# Patient Record
Sex: Male | Born: 1940
Health system: Southern US, Community
[De-identification: ages and names within clinical notes are randomized; demographics above are authoritative.]

## PROBLEM LIST (undated history)

## (undated) DIAGNOSIS — K219 Gastro-esophageal reflux disease without esophagitis: Secondary | ICD-10-CM

## (undated) DIAGNOSIS — I219 Acute myocardial infarction, unspecified: Secondary | ICD-10-CM

## (undated) DIAGNOSIS — N189 Chronic kidney disease, unspecified: Secondary | ICD-10-CM

## (undated) DIAGNOSIS — R0602 Shortness of breath: Secondary | ICD-10-CM

## (undated) DIAGNOSIS — I2510 Atherosclerotic heart disease of native coronary artery without angina pectoris: Secondary | ICD-10-CM

## (undated) DIAGNOSIS — I739 Peripheral vascular disease, unspecified: Secondary | ICD-10-CM

## (undated) DIAGNOSIS — C801 Malignant (primary) neoplasm, unspecified: Secondary | ICD-10-CM

## (undated) DIAGNOSIS — I6529 Occlusion and stenosis of unspecified carotid artery: Secondary | ICD-10-CM

## (undated) DIAGNOSIS — E785 Hyperlipidemia, unspecified: Secondary | ICD-10-CM

## (undated) DIAGNOSIS — I10 Essential (primary) hypertension: Secondary | ICD-10-CM

## (undated) DIAGNOSIS — G4730 Sleep apnea, unspecified: Secondary | ICD-10-CM

## (undated) HISTORY — PX: MOHS SURGERY: SHX181

## (undated) HISTORY — DX: Atherosclerotic heart disease of native coronary artery without angina pectoris: I25.10

## (undated) HISTORY — PX: CAROTID ENDARTERECTOMY: SUR193

## (undated) HISTORY — DX: Essential (primary) hypertension: I10

## (undated) HISTORY — DX: Hyperlipidemia, unspecified: E78.5

## (undated) HISTORY — PX: CYST REMOVAL HAND: SHX6279

## (undated) HISTORY — DX: Peripheral vascular disease, unspecified: I73.9

## (undated) HISTORY — PX: SHOULDER ARTHROSCOPY: SHX128

## (undated) HISTORY — DX: Occlusion and stenosis of unspecified carotid artery: I65.29

## (undated) LAB — BASIC METABOLIC PANEL: Chloride: 10 — AB (ref 99–108)

---

## 1990-12-30 HISTORY — PX: CORONARY ARTERY BYPASS GRAFT: SHX141

## 1998-07-22 ENCOUNTER — Inpatient Hospital Stay (HOSPITAL_COMMUNITY): Admission: EM | Admit: 1998-07-22 | Discharge: 1998-07-23 | Payer: Self-pay | Admitting: Emergency Medicine

## 1999-07-17 ENCOUNTER — Inpatient Hospital Stay (HOSPITAL_COMMUNITY): Admission: RE | Admit: 1999-07-17 | Discharge: 1999-07-19 | Payer: Self-pay | Admitting: *Deleted

## 1999-07-17 ENCOUNTER — Encounter: Payer: Self-pay | Admitting: *Deleted

## 1999-07-27 ENCOUNTER — Inpatient Hospital Stay: Admission: EM | Admit: 1999-07-27 | Discharge: 1999-07-30 | Payer: Self-pay | Admitting: Vascular Surgery

## 1999-07-28 ENCOUNTER — Encounter: Payer: Self-pay | Admitting: Vascular Surgery

## 1999-08-10 ENCOUNTER — Emergency Department (HOSPITAL_COMMUNITY): Admission: EM | Admit: 1999-08-10 | Discharge: 1999-08-10 | Payer: Self-pay | Admitting: Emergency Medicine

## 2004-12-12 ENCOUNTER — Ambulatory Visit: Payer: Self-pay | Admitting: Cardiology

## 2005-01-07 ENCOUNTER — Ambulatory Visit: Payer: Self-pay

## 2005-07-11 ENCOUNTER — Ambulatory Visit (HOSPITAL_COMMUNITY): Admission: RE | Admit: 2005-07-11 | Discharge: 2005-07-11 | Payer: Self-pay | Admitting: Family Medicine

## 2005-11-27 ENCOUNTER — Ambulatory Visit: Payer: Self-pay | Admitting: Cardiology

## 2005-11-29 ENCOUNTER — Ambulatory Visit: Payer: Self-pay

## 2005-12-10 ENCOUNTER — Ambulatory Visit (HOSPITAL_BASED_OUTPATIENT_CLINIC_OR_DEPARTMENT_OTHER): Admission: RE | Admit: 2005-12-10 | Discharge: 2005-12-10 | Payer: Self-pay | Admitting: Orthopaedic Surgery

## 2005-12-10 ENCOUNTER — Ambulatory Visit (HOSPITAL_COMMUNITY): Admission: RE | Admit: 2005-12-10 | Discharge: 2005-12-10 | Payer: Self-pay | Admitting: Orthopaedic Surgery

## 2006-01-24 ENCOUNTER — Ambulatory Visit: Payer: Self-pay

## 2006-12-15 ENCOUNTER — Ambulatory Visit: Payer: Self-pay | Admitting: Cardiology

## 2007-12-01 ENCOUNTER — Ambulatory Visit: Payer: Self-pay | Admitting: Cardiology

## 2007-12-17 ENCOUNTER — Ambulatory Visit: Payer: Self-pay

## 2007-12-17 ENCOUNTER — Ambulatory Visit: Payer: Self-pay | Admitting: Cardiology

## 2007-12-17 LAB — CONVERTED CEMR LAB
ALT: 21 units/L (ref 0–53)
AST: 20 units/L (ref 0–37)
Albumin: 3.4 g/dL — ABNORMAL LOW (ref 3.5–5.2)
Alkaline Phosphatase: 68 units/L (ref 39–117)
BUN: 11 mg/dL (ref 6–23)
Bilirubin, Direct: 0.1 mg/dL (ref 0.0–0.3)
CO2: 30 meq/L (ref 19–32)
Calcium: 9.7 mg/dL (ref 8.4–10.5)
Chloride: 107 meq/L (ref 96–112)
Cholesterol: 186 mg/dL (ref 0–200)
Creatinine, Ser: 0.8 mg/dL (ref 0.4–1.5)
GFR calc Af Amer: 125 mL/min
GFR calc non Af Amer: 103 mL/min
Glucose, Bld: 103 mg/dL — ABNORMAL HIGH (ref 70–99)
HDL: 28.2 mg/dL — ABNORMAL LOW (ref >39.0)
LDL Cholesterol: 124 mg/dL — ABNORMAL HIGH (ref 0–99)
Potassium: 4.4 meq/L (ref 3.5–5.1)
Pro B Natriuretic peptide (BNP): 49 pg/mL (ref 0.0–100.0)
Sodium: 142 meq/L (ref 135–145)
Total Bilirubin: 1 mg/dL (ref 0.3–1.2)
Total CHOL/HDL Ratio: 6.6
Total Protein: 6.6 g/dL (ref 6.0–8.3)
Triglycerides: 170 mg/dL — ABNORMAL HIGH (ref 0–149)
VLDL: 34 mg/dL (ref 0–40)

## 2008-02-02 ENCOUNTER — Ambulatory Visit: Payer: Self-pay | Admitting: Cardiology

## 2008-05-13 ENCOUNTER — Ambulatory Visit: Payer: Self-pay | Admitting: Vascular Surgery

## 2008-07-26 ENCOUNTER — Ambulatory Visit: Payer: Self-pay | Admitting: Gastroenterology

## 2008-08-30 ENCOUNTER — Ambulatory Visit: Payer: Self-pay | Admitting: Vascular Surgery

## 2008-09-28 ENCOUNTER — Ambulatory Visit: Payer: Self-pay | Admitting: Cardiology

## 2008-12-16 ENCOUNTER — Ambulatory Visit: Payer: Self-pay

## 2009-03-25 DIAGNOSIS — E785 Hyperlipidemia, unspecified: Secondary | ICD-10-CM | POA: Insufficient documentation

## 2009-03-25 DIAGNOSIS — I739 Peripheral vascular disease, unspecified: Secondary | ICD-10-CM | POA: Insufficient documentation

## 2009-03-25 DIAGNOSIS — I2510 Atherosclerotic heart disease of native coronary artery without angina pectoris: Secondary | ICD-10-CM | POA: Insufficient documentation

## 2009-03-27 DIAGNOSIS — I6529 Occlusion and stenosis of unspecified carotid artery: Secondary | ICD-10-CM | POA: Insufficient documentation

## 2009-03-27 DIAGNOSIS — I25810 Atherosclerosis of coronary artery bypass graft(s) without angina pectoris: Secondary | ICD-10-CM | POA: Insufficient documentation

## 2009-03-28 ENCOUNTER — Encounter: Payer: Self-pay | Admitting: Cardiology

## 2009-03-28 ENCOUNTER — Ambulatory Visit: Payer: Self-pay | Admitting: Cardiology

## 2009-04-19 ENCOUNTER — Telehealth: Payer: Self-pay | Admitting: Cardiology

## 2009-05-23 ENCOUNTER — Ambulatory Visit: Payer: Self-pay

## 2009-05-23 ENCOUNTER — Encounter: Payer: Self-pay | Admitting: Cardiology

## 2009-06-22 ENCOUNTER — Encounter: Payer: Self-pay | Admitting: Cardiology

## 2009-06-22 ENCOUNTER — Telehealth: Payer: Self-pay | Admitting: Cardiology

## 2009-07-04 ENCOUNTER — Telehealth: Payer: Self-pay | Admitting: Cardiology

## 2009-12-27 ENCOUNTER — Encounter: Payer: Self-pay | Admitting: Cardiology

## 2009-12-28 ENCOUNTER — Ambulatory Visit: Payer: Self-pay

## 2009-12-28 ENCOUNTER — Encounter: Payer: Self-pay | Admitting: Cardiology

## 2010-02-05 ENCOUNTER — Encounter: Payer: Self-pay | Admitting: Cardiology

## 2010-03-02 ENCOUNTER — Ambulatory Visit: Payer: Self-pay | Admitting: Cardiology

## 2010-06-25 ENCOUNTER — Encounter: Payer: Self-pay | Admitting: Cardiovascular Disease

## 2010-07-03 ENCOUNTER — Telehealth: Payer: Self-pay | Admitting: Cardiovascular Disease

## 2010-07-03 ENCOUNTER — Encounter: Payer: Self-pay | Admitting: Cardiovascular Disease

## 2010-09-17 ENCOUNTER — Telehealth: Payer: Self-pay | Admitting: Cardiovascular Disease

## 2010-10-01 ENCOUNTER — Ambulatory Visit: Payer: Self-pay | Admitting: Cardiovascular Disease

## 2010-10-03 ENCOUNTER — Encounter: Payer: Self-pay | Admitting: Cardiovascular Disease

## 2010-10-15 ENCOUNTER — Telehealth: Payer: Self-pay | Admitting: Cardiovascular Disease

## 2010-10-17 LAB — CONVERTED CEMR LAB
ALT: 16 units/L (ref 0–53)
AST: 18 units/L (ref 0–37)
Albumin: 4.3 g/dL (ref 3.5–5.2)
Alkaline Phosphatase: 55 units/L (ref 39–117)
Bilirubin, Direct: 0.1 mg/dL (ref 0.0–0.3)
Cholesterol: 185 mg/dL (ref 0–200)
HDL: 41 mg/dL (ref >39)
Indirect Bilirubin: 0.4 mg/dL (ref 0.0–0.9)
LDL Cholesterol: 123 mg/dL — ABNORMAL HIGH (ref 0–99)
Total Bilirubin: 0.5 mg/dL (ref 0.3–1.2)
Total CHOL/HDL Ratio: 4.5
Total Protein: 6.9 g/dL (ref 6.0–8.3)
Triglycerides: 103 mg/dL (ref <150)
VLDL: 21 mg/dL (ref 0–40)

## 2010-12-28 ENCOUNTER — Encounter: Payer: Self-pay | Admitting: Cardiology

## 2011-01-01 ENCOUNTER — Encounter: Payer: Self-pay | Admitting: Cardiovascular Disease

## 2011-01-01 ENCOUNTER — Ambulatory Visit: Admission: RE | Admit: 2011-01-01 | Discharge: 2011-01-01 | Payer: Self-pay | Source: Home / Self Care

## 2011-01-01 ENCOUNTER — Ambulatory Visit: Admit: 2011-01-01 | Payer: Self-pay | Admitting: Cardiovascular Disease

## 2011-01-29 NOTE — Miscellaneous (Signed)
  Clinical Lists Changes  Medications: Removed medication of SIMVASTATIN 40 MG TABS (SIMVASTATIN) Take one tablet by mouth daily at bedtime Added new medication of CRESTOR 20 MG TABS (ROSUVASTATIN CALCIUM) Take one tablet by mouth daily. - Signed Rx of CRESTOR 20 MG TABS (ROSUVASTATIN CALCIUM) Take one tablet by mouth daily.;  #30 x 6;  Signed;  Entered by: Benedict Needy, RN;  Authorized by: Dossie Arbour MD;  Method used: Print then Give to Patient    Prescriptions: CRESTOR 20 MG TABS (ROSUVASTATIN CALCIUM) Take one tablet by mouth daily.  #30 x 6   Entered by:   Benedict Needy, RN   Authorized by:   Dossie Arbour MD   Signed by:   Benedict Needy, RN on 06/25/2010   Method used:   Print then Give to Patient   RxID:   [id number]

## 2011-01-29 NOTE — Progress Notes (Signed)
Summary: Refill  Phone Note Call from Patient   Caller: Spouse Summary of Call: Would like a Rx called into [facility] pharmacy for the Crestor 40 mg one tablet at bedtime.  Initial call taken by: Bishop Dublin, CMA,  October 15, 2010 9:53 AM

## 2011-01-29 NOTE — Progress Notes (Signed)
Summary: LABS  Phone Note Call from Patient Call back at Home Phone [phone]   Caller: [facility] Call For: [facility] Summary of Call: STATES THAT THE PTS MEDICINE WAS CHANGED AND THAT GOLLAN WANTED THE PT TO HAVE LABWORK DONE IN 3 MONTHS-I DO NOT SEE ANY DOCUMENTATION OF THE PT NEEDING LABWORK-PLEASE CALL THE PTS WIFE Initial call taken by: Harlon Flor,  September 17, 2010 9:10 AM  Follow-up for Phone Call        Attempted to call pt wife answered phone dogs began barking wife said she would call back. Needs lip/lft in Oct. Benedict Needy, RN  September 17, 2010 5:03 PM   [facility] Benedict Needy, RN  September 18, 2010 9:18 AM   Additional Follow-up for Phone Call Additional follow up Details #1::        Patient's wife called back and scheduled labs for Oct. 10th. Additional Follow-up by: Benedict Needy, RN,  September 19, 2010 10:06 AM

## 2011-01-29 NOTE — Progress Notes (Signed)
Summary: Questions about Crestor  Phone Note Call from Patient Call back at Home Phone [phone]   Caller: Spouse Call For: rn Summary of Call: Patient's wife called with questions regarding the Crestor and directions on taking the medications. Please give wife a call back Initial call taken by: West Carbo,  July 03, 2010 9:56 AM  Follow-up for Phone Call        [facility] Benedict Needy, RN  July 03, 2010 12:40 PM   Additional Follow-up for Phone Call Additional follow up Details #1::        Patient's wife returned call and would like for you to call her back Additional Follow-up by: [place] Carbo,  July 03, 2010 2:47 PM    Additional Follow-up for Phone Call Additional follow up Details #2::    called and talked to wife answered questions about crestor.  Follow-up by: Benedict Needy, RN,  July 03, 2010 3:32 PM

## 2011-01-29 NOTE — Letter (Signed)
Summary: Custom - Lipid  Architectural technologist at [facility]   [place], [place] [address]   Phone: [phone]  Fax: [phone]     October 03, 2010 MRN: [id number]   Clifford Aguilar [address] [place], [place]  [address]   Dear Mr. Enlow,  We have reviewed your cholesterol results.  They are as follows:     Total Cholesterol:    185 (Desirable: less than 150)       HDL  Cholesterol:     41  (Desirable: greater than 40 for men and 50 for women)       LDL Cholesterol:       123  (Desirable: less than 70 for moderate to high risk)       Triglycerides:       103  (Desirable: less than 150)  Our recommendations include: PLEASE CALL OFFICE FOR RECOMMENDATIONS ON MEDICATIONS.    Call our office at the number listed above if you have any questions.  Lowering your LDL cholesterol is important, but it is only one of a large number of "risk factors" that may indicate that you are at risk for heart disease, stroke or other complications of hardening of the arteries.  Other risk factors include:   A.  Cigarette Smoking* B.  High Blood Pressure* C.  Obesity* D.   Low HDL Cholesterol (see yours above)* E.   Diabetes Mellitus (higher risk if your is uncontrolled) F.  Family history of premature heart disease G.  Previous history of stroke or cardiovascular disease    *These are risk factors YOU HAVE CONTROL OVER.  For more information, visit .  There is now evidence that lowering the TOTAL CHOLESTEROL AND LDL CHOLESTEROL can reduce the risk of heart disease.  The American Heart Association recommends the following guidelines for the treatment of elevated cholesterol:  1.  If there is now current heart disease and less than two risk factors, TOTAL CHOLESTEROL should be less than 200 and LDL CHOLESTEROL should be less than 100. 2.  If there is current heart disease or two or more risk factors, TOTAL CHOLESTEROL should be less than 200 and LDL CHOLESTEROL  should be less than 70.  A diet low in cholesterol, saturated fat, and calories is the cornerstone of treatment for elevated cholesterol.  Cessation of smoking and exercise are also important in the management of elevated cholesterol and preventing vascular disease.  Studies have shown that 30 to 60 minutes of physical activity most days can help lower blood pressure, lower cholesterol, and keep your weight at a healthy level.  Drug therapy is used when cholesterol levels do not respond to therapeutic lifestyle changes (smoking cessation, diet, and exercise) and remains unacceptably high.  If medication is started, it is important to have you levels checked periodically to evaluate the need for further treatment options.  Thank you,    [employer] Team

## 2011-01-29 NOTE — Assessment & Plan Note (Signed)
Summary: f1y   Visit Type:  Follow-up Primary Provider:  dr.gilbert  CC:  follow up.  History of Present Illness: The patient is 70 years old and returns for management of CAD and peripheral vascular disease. He had bypass surgery in 1992. He has had bilateral carotid endarterectomies and has had multiple surgical procedures on his lower extremity by Dr. Arbie Cookey.  Over the past year he has done well. He's had no chest pain shortness breath or palpitations. He quit smoking after his bypass surgery in 1992.    He had carotid Doppler studies and December which showed a total occlusion of the internal carotid on the left and 40-59% on the right. His had not changed. He had peripheral arterial studies done in May of 2000 and and his index on the right was 0.49 and on the left was 0.44. He does get claudication after walking for about 5 minutes but he stops and then resumes. This hasn't changed.  He had a recent lipid profile which showed an HDL of 42 and an LDL of 112.  Current Medications (verified): 1)  Verapamil Hcl Cr 240 Mg Xr24h-Cap (Verapamil Hcl) .... Take One Capsule By Mouth Daily 2)  Simvastatin 40 Mg Tabs (Simvastatin) .... Take One Tablet By Mouth Daily At Bedtime 3)  Miralax 17g .... Daily 4)  Lotensin 20 Mg Tabs (Benazepril Hcl) .... Take One Tab By Mouth Once Daily 5)  Aspirin 81 Mg Tbec (Aspirin) .... Take One Tablet By Mouth Daily 6)  Nortriptyline Hcl 25 Mg Caps (Nortriptyline Hcl) .... Take As Directed For Sleeping 7)  Garlic   Tabs .... 1000mg  7 Tabs Daily 8)  Rantidine 75 Mg .Marland Kitchen.. 1 Tab Daily 9)  Saw Palmetto .... 1725mg  2 Tabs Daily 10)  Multivitamins   Tabs (Multiple Vitamin) .Marland Kitchen.. 1 Daily 11)  Fish Oil   Oil (Fish Oil) .... 1000mg  3 Tabs Bid 12)  Nitrolingual 0.4 Mg/spray Soln (Nitroglycerin) .... One Spray Under Tongue Every 5 Minutes As Needed For Chest Pain---May Repeat Times Three 13)  Co Q-10 30 Mg  Caps (Coenzyme Q10) .... 100mg   Once Daily  Allergies  (verified): No Known Drug Allergies  Past History:  Past Medical History: Reviewed history from 03/25/2009 and no changes required.  1. Coronary artery disease status post coronary bypass graft surgery     in 1992, stable. 2. Good left ventricular function. 3. Peripheral vascular disease status post multiple revascularization     procedures. 4. Carotid artery disease status post right carotid endarterectomy     with 46% stenosis in the right by a recent Doppler. 5. Hyperlipidemia. 6. Status post surgery for sleep apnea.   Review of Systems       ROS is negative except as outlined in HPI.   Vital Signs:  Patient profile:   70 year old male Height:      69 inches Weight:      182 pounds BMI:     26.97 Pulse rate:   62 / minute BP sitting:   142 / 72  (left arm) Cuff size:   regular  Vitals Entered By: Hardin Negus, RMA (March 02, 2010 2:16 PM)  Physical Exam  Additional Exam:  Gen. Well-nourished, in no distress   Neck: No JVD, thyroid not enlarged, bilateral carotid endarterectomy scars with bruits Lungs: No tachypnea, clear without rales, rhonchi or wheezes Cardiovascular: Rhythm regular, PMI not displaced,  heart sounds  normal, no murmurs or gallops, no peripheral edema, the pulses were decreased  in the right foot and had difficulty feeling numb in the left foot Abdomen: BS normal, abdomen soft and non-tender without masses or organomegaly, no hepatosplenomegaly. MS: No deformities, no cyanosis or clubbing   Neuro:  No focal sns   Skin:  no lesions    Impression & Recommendations:  Problem # 1:  CAD, AUTOLOGOUS BYPASS GRAFT (ICD-414.02) He had bypass surgery in 1992. He's had no recent chest pain George breath or palpitations. His palm appears stable. We will continue current medications. His updated medication list for this problem includes:    Verapamil Hcl Cr 240 Mg Xr24h-cap (Verapamil hcl) .Marland Kitchen... Take one capsule by mouth daily    Lotensin 20 Mg Tabs  (Benazepril hcl) .Marland Kitchen... Take one tab by mouth once daily    Aspirin 81 Mg Tbec (Aspirin) .Marland Kitchen... Take one tablet by mouth daily    Nitrolingual 0.4 Mg/spray Soln (Nitroglycerin) ..... One spray under tongue every 5 minutes as needed for chest pain---may repeat times three  Orders: EKG w/ Interpretation (93000)  Problem # 2:  PVD (ICD-443.9) He has had multiple peripheral procedures. He has reduced indices bilaterally. He does have claudication but this appears stable. We will plan followup peripheral Dopplers in a year. We will plan PT referral if his symptoms or indices change.  Problem # 3:  CAROTID ARTERY DISEASE (ICD-433.10) He has a total occlusion of the internal carotid artery on the left and 40-59% narrowing on the right. This is stable. We'll plan repeat carotid Dopplers in one year. His updated medication list for this problem includes:    Aspirin 81 Mg Tbec (Aspirin) .Marland Kitchen... Take one tablet by mouth daily  Problem # 4:  HYPERLIPIDEMIA-MIXED (ICD-272.4) he is not at target with his LDL at 112. I encouraged him to work on weight reduction diet. Causative issue save for this then switched to another statin. The following medications were removed from the medication list:    Zetia 10 Mg Tabs (Ezetimibe) .Marland Kitchen... Take one tablet by mouth daily. His updated medication list for this problem includes:    Simvastatin 40 Mg Tabs (Simvastatin) .Marland Kitchen... Take one tablet by mouth daily at bedtime  Patient Instructions: 1)  Your physician wants you to follow-up in: 1 year with Dr. Dossie Arbour in our [place] office.  You will receive a reminder letter in the mail two months in advance. If you don't receive a letter, please call our office to schedule the follow-up appointment.

## 2011-01-29 NOTE — Miscellaneous (Signed)
Summary: crestor  Clinical Lists Changes  Medications: Changed medication from CRESTOR 20 MG TABS (ROSUVASTATIN CALCIUM) Take one tablet by mouth daily. to CRESTOR 20 MG TABS (ROSUVASTATIN CALCIUM) Take one tablet by mouth daily. - Signed Rx of CRESTOR 20 MG TABS (ROSUVASTATIN CALCIUM) Take one tablet by mouth daily.;  #14 x 0;  Signed;  Entered by: Benedict Needy, RN;  Authorized by: Dossie Arbour MD;  Method used: Samples Given    Prescriptions: CRESTOR 20 MG TABS (ROSUVASTATIN CALCIUM) Take one tablet by mouth daily.  #14 x 0   Entered by:   Benedict Needy, RN   Authorized by:   Dossie Arbour MD   Signed by:   Benedict Needy, RN on 07/03/2010   Method used:   Samples Given   RxID:   [id number]

## 2011-01-31 NOTE — Miscellaneous (Signed)
Summary: Orders Update  Clinical Lists Changes  Orders: Added new Test order of Arterial Duplex Lower Extremity (Arterial Duplex Low) - Signed 

## 2011-01-31 NOTE — Miscellaneous (Signed)
Summary: Orders Update  Clinical Lists Changes  Orders: Added new Test order of Carotid Duplex (Carotid Duplex) - Signed 

## 2011-02-12 ENCOUNTER — Encounter: Payer: Self-pay | Admitting: Cardiovascular Disease

## 2011-02-20 NOTE — Miscellaneous (Signed)
Summary: Orders Update-lipid/liver/sab  Clinical Lists Changes  Orders: Added new Test order of T-Lipid Profile [phone]) - Signed Added new Test order of T-Hepatic Function [phone]) - Signed    Pt states he is going to [facility] for blood work at the end of this month for his PCP. Pt wanted to know if we could fax orders over to get all the labs done at once. Notified pt that we will fax over lab orders to [facility] and notify them to fax over results./sab

## 2011-02-27 ENCOUNTER — Encounter: Payer: Self-pay | Admitting: Cardiovascular Disease

## 2011-03-04 ENCOUNTER — Telehealth: Payer: Self-pay | Admitting: Cardiovascular Disease

## 2011-03-05 ENCOUNTER — Encounter: Payer: Self-pay | Admitting: Cardiovascular Disease

## 2011-03-06 ENCOUNTER — Other Ambulatory Visit: Payer: Self-pay

## 2011-03-10 ENCOUNTER — Encounter: Payer: Self-pay | Admitting: Cardiovascular Disease

## 2011-03-11 ENCOUNTER — Encounter: Payer: Self-pay | Admitting: Cardiovascular Disease

## 2011-03-11 ENCOUNTER — Ambulatory Visit (INDEPENDENT_AMBULATORY_CARE_PROVIDER_SITE_OTHER): Payer: Medicare Other | Admitting: Cardiovascular Disease

## 2011-03-11 DIAGNOSIS — R413 Other amnesia: Secondary | ICD-10-CM | POA: Insufficient documentation

## 2011-03-11 DIAGNOSIS — I739 Peripheral vascular disease, unspecified: Secondary | ICD-10-CM

## 2011-03-11 DIAGNOSIS — I10 Essential (primary) hypertension: Secondary | ICD-10-CM

## 2011-03-11 DIAGNOSIS — I2510 Atherosclerotic heart disease of native coronary artery without angina pectoris: Secondary | ICD-10-CM

## 2011-03-12 NOTE — Progress Notes (Signed)
Summary: Lab order  Phone Note Call from Patient Call back at Home Phone [phone]   Caller: Self Call For: Gollan Summary of Call: Pt needs lab order sent to [facility] for the labs that were going to be drawn here. Initial call taken by: Harlon Flor,  March 04, 2011 4:51 PM  Follow-up for Phone Call        notified patient liv/lip order faxed to [facility] on [address] Follow-up by: Bishop Dublin, CMA,  March 04, 2011 5:12 PM

## 2011-03-19 NOTE — Assessment & Plan Note (Signed)
Summary: 1 YEAR F/U/EST.CARE/FORMER [facility] PT/SAB   Visit Type:  Initial Consult Primary Provider:  dr.gilbert  CC:  Former patient of Dr. Juanda Chance.  Establish care with new Cardiology for CAD.  Denies chest pain or shortness of breath..  History of Present Illness: The patient is 70 years old and returns for management of CAD and peripheral vascular disease. He had bypass surgery in 1992. He has had  carotid endarterectomy on the right and has had multiple surgical procedures on his lower extremity by Dr. Arbie Cookey. 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  Most recent ultrasound of his carotid shows 40-59% disease on the right, chronically occluded left carotid.   ABIs were moderately depressed in his lower extremities. he does have some symptoms of claudication but this has been ongoing for long periods of time.  He is able to exert himself without any significant chest pain. He has not had a cardiac catheterization or stress test in 10 years. He believes he had a catheterization 10 years ago, 3 catheterizations since his bypass surgery 20 years ago.  He recently took himself off Crestor as he believes it was making his memory worse. He had no similar problems on Crestor or Lipitor.  Over the past year he has done well. He's had no chest pain shortness breath or palpitations. He quit smoking after his bypass surgery in 1992.    EKG shows normal sinus rhythm with rate 77 beats per minute, nonspecific ST and T wave changes in V4 to V6, one and aVL  Preventive Screening-Counseling & Management  Caffeine-Diet-Exercise     Does Patient Exercise: yes  Current Medications (verified): 1)  Verapamil Hcl Cr 240 Mg Xr24h-Cap (Verapamil Hcl) .... Take One Capsule By Mouth Daily 2)  Miralax 17g .... Daily 3)  Lotensin 20 Mg Tabs (Benazepril Hcl) .... Take One Tab By Mouth Once Daily 4)  Aspirin 81 Mg Tbec (Aspirin) .... Take One Tablet By  Mouth Daily 5)  Nortriptyline Hcl 25 Mg Caps (Nortriptyline Hcl) .... Take As Directed For Sleeping 6)  Garlic   Tabs .... 1000mg  7 Tabs Daily 7)  Rantidine 75 Mg .Marland Kitchen.. 1 Tab Daily 8)  Saw Palmetto .... 1725mg  2 Tabs Daily 9)  Multivitamins   Tabs (Multiple Vitamin) .Marland Kitchen.. 1 Daily 10)  Fish Oil   Oil (Fish Oil) .... 1000mg  3 Tabs Bid 11)  Nitrolingual 0.4 Mg/spray Soln (Nitroglycerin) .... One Spray Under Tongue Every 5 Minutes As Needed For Chest Pain---May Repeat Times Three 12)  Co Q-10 30 Mg  Caps (Coenzyme Q10) .... 100mg   Once Daily 13)  Crestor 40 Mg Tabs (Rosuvastatin Calcium) .... One Tablet At Bedtime 14)  Flaxseed Oil 1000 Mg Caps (Flaxseed (Linseed)) .... One Tablet Once Daily  Allergies (verified): No Known Drug Allergies  Past History:  Past Medical History: Last updated: 03/25/2009  1. Coronary artery disease status post coronary bypass graft surgery     in 1992, stable. 2. Good left ventricular function. 3. Peripheral vascular disease status post multiple revascularization     procedures. 4. Carotid artery disease status post right carotid endarterectomy     with 46% stenosis in the right by a recent Doppler. 5. Hyperlipidemia. 6. Status post surgery for sleep apnea.  Past Surgical History: Last updated: 03/25/2009 post surgery for sleep apnea bypass surgery in 1992 right carotid endarterectomy peripheral vascular revascularization procedures peripheral  vascular revascularization procedures Right shoulder arthroscopic acromioplasty.  Right shoulder arthroscopic  acromioclavicular joint resection.   Right shoulder arthroscopic debridement.  Family History: Last updated: 03/25/2009 heats diesease  Social History: Last updated: 03/11/2011 Married  Tobacco Use - Former.  Alcohol Use - yes--wine occas. Regular Exercise - yes-- walks, rides bike and yoga.  Risk Factors: Smoking Status: quit (03/25/2009)  Social History: Married  Tobacco Use - Former.    Alcohol Use - yes--wine occas. Regular Exercise - yes-- walks, rides bike and yoga.Does Patient Exercise:  yes  Review of Systems  The patient denies fever, weight loss, weight gain, vision loss, decreased hearing, hoarseness, chest pain, syncope, dyspnea on exertion, peripheral edema, prolonged cough, abdominal pain, incontinence, muscle weakness, depression, and enlarged lymph nodes.         pain in his legs when he walks, some shortness of breath, recent memory problems  Vital Signs:  Patient profile:   70 year old male Height:      69 inches Weight:      177 pounds BMI:     26.23 Pulse rate:   77 / minute BP sitting:   178 / 84  (left arm) Cuff size:   regular  Vitals Entered By: Bishop Dublin, CMA (March 11, 2011 10:54 AM)  Physical Exam  General:  Well developed, well nourished, in no acute distress. Head:  normocephalic and atraumatic Neck:  Neck supple, no JVD. No masses, thyromegaly or abnormal cervical nodes. Lungs:  mildly decreased breath sounds throughout otherwise clear Heart:  Non-displaced PMI, chest non-tender; regular rate and rhythm, S1, S2 wit I-II/VI SEM RSB,  no rubs or gallops. Carotid upstroke normal, 2+ bruit on the right. Normal abdominal aortic size, no bruits. Femorals normal pulses, no bruits. decreased Pedals pulses bilaterally.  No edema, no varicosities. Abdomen:  Bowel sounds positive; abdomen soft and non-tender without masses Msk:  Back normal, normal gait. Muscle strength and tone normal. Extremities:  No clubbing or cyanosis. Neurologic:  Alert and oriented x 3. Skin:  Intact without lesions or rashes. Psych:  Normal affect.   Impression & Recommendations:  Problem # 1:  CAD, AUTOLOGOUS BYPASS GRAFT (ICD-414.02) history of severe coronary artery disease. He has not had workup with catheterization in 10 years. Currently with no symptoms of shortness of breath or chest pain. We have encouraged to increase his aspirin to 81 mg x2. No  testing ordered at this time.  His updated medication list for this problem includes:    Verapamil Hcl Cr 240 Mg Xr24h-cap (Verapamil hcl) .Marland Kitchen... Take one capsule by mouth daily    Lotensin 20 Mg Tabs (Benazepril hcl) .Marland Kitchen... Take one tab by mouth once daily    Aspirin 81 Mg Tbec (Aspirin) .Marland Kitchen... Take 2 tablet by mouth once daily.    Nitrolingual 0.4 Mg/spray Soln (Nitroglycerin) ..... One spray under tongue every 5 minutes as needed for chest pain---may repeat times three  Problem # 2:  CAROTID ARTERY DISEASE (ICD-433.10) Occlusion on the left. Moderate disease on the right. We'll continue aggressive lipid management. Recheck carotid ultrasound in one year.  His updated medication list for this problem includes:    Aspirin 81 Mg Tbec (Aspirin) .Marland Kitchen... Take 2 tablet by mouth once daily.  Problem # 3:  HYPERLIPIDEMIA-MIXED (ICD-272.4) He stopped Crestor secondary to possible memory loss. He had no problems on simvastatin and has previously been on zetia. We have given him some samples of Vytorin 10/40 mg daily. He can continue this medication or change to Lipitor 80 mg at the end of the samples.  His  updated medication list for this problem includes:    Crestor 40 Mg Tabs (Rosuvastatin calcium) ..... One tablet at bedtime    Vytorin 10-40 Mg Tabs (Ezetimibe-simvastatin) .Marland Kitchen... Take one tablet by mouth dailyat bedtime  Problem # 4:  MEMORY LOSS (ICD-780.93) Uncertain if his memory loss is secondary to the medication. If his symptoms persist, I suggested he talk with [facility] family practice for possible trial of Aricept or Exelon patch  Patient Instructions: 1)  Your physician recommends that you schedule a follow-up appointment in: 6 months 2)  Your physician has recommended you make the following change in your medication: INCREASE Aspirin 81mg  2 tablet once daily. START Vytorin 10-40 once daily samples and call in 2 weeks. 3)  Your physician has requested that you regularly monitor and record  your blood pressure readings at home.  Please use the same machine at the same time of day to check your readings and record them to bring to your follow-up visit. Please call with in 2 weeks with BP results and if Vytorin will work, we can call in prescription. Prescriptions: VYTORIN 10-40 MG TABS (EZETIMIBE-SIMVASTATIN) Take one tablet by mouth dailyat bedtime  #30 x 0   Entered by:   Lanny Hurst RN   Authorized by:   Dossie Arbour MD   Signed by:   Lanny Hurst RN on 03/11/2011   Method used:   Samples Given   RxID:   [id number]

## 2011-03-28 ENCOUNTER — Telehealth: Payer: Self-pay

## 2011-03-28 MED ORDER — SIMVASTATIN 40 MG PO TABS: 40.0000 mg | ORAL_TABLET | Freq: Every evening | ORAL | Status: DC

## 2011-03-28 MED ORDER — EZETIMIBE 10 MG PO TABS: 10.0000 mg | ORAL_TABLET | Freq: Every day | ORAL | Status: DC

## 2011-03-28 NOTE — Telephone Encounter (Signed)
Was told to call regarding his memory loss with cholesterol medication.  Fara Boros (wife) called and would like a prescription called in for Zetia 10 mg and simvastatin 40 mg. The patient is not having any memory loss at this time with taking the Zetia and Vytorin now. Needs a print out script  for mail order for 90 day supply. Please contact wife @ [phone].

## 2011-05-14 NOTE — Assessment & Plan Note (Signed)
[facility] HEALTHCARE                            CARDIOLOGY OFFICE NOTE   NAME:Kleven, AVRAJ LINDROTH                        MRN:          [id number]  DATE:09/28/2008                            DOB:          04-02-1941    PRIMARY CARE PHYSICIAN:  Dr. Julieanne Manson   CLINICAL HISTORY:  Clifford Aguilar returned for followup management of his  coronary heart disease.  He returned early because he has lost about 20  pounds and he is hoping he can get off some of his medicines.  He is  also due to enter the donut hole and needs to conserve money.   He had bypass surgery in 1992 and had a nonischemic Myoview scan in  2006.  He has had no recent chest pain, shortness of breath, or  palpitations.   He also has had peripheral vascular disease and is followed by Dr. Arbie Cookey  and has had multiple peripheral procedures.  He had peripheral arterial  studies done in May which showed indices of about 0.5 bilaterally.  He  also has carotid stenosis, and he had carotid Dopplers which showed a  known total occlusion on the left and 40-59% stenosis on the right.   His past medical history is significant for previous right carotid  endarterectomy.  He also has hyperlipidemia.   His current medications include verapamil 240 mg daily, Lipitor 80 mg  daily, MiraLax, aspirin, garlic, ranitidine 75 mg daily, Lopressor of  unknown dose, and Zetia 10 mg daily.   On examination, blood pressure was 134/66 and pulse 45 and regular.  There was no venous distension.  The carotid pulses were full with  bilateral bruits.  He has a right carotid endarterectomy scar.  Chest  was clear without rales or rhonchi.  Cardiac rhythm was regular.  The  heart sounds were normal.  I could hear no murmurs or gallops.  The  abdomen was soft with normal bowel sounds.  There was no  hepatosplenomegaly.  There was no peripheral edema.  I had difficulty  feeling pedal pulses.   IMPRESSION:  1. Coronary artery  disease status post coronary bypass graft surgery      in 1992, stable.  2. Good left ventricular function.  3. Peripheral vascular disease status post multiple revascularization      procedures.  4. Carotid artery disease status post right carotid endarterectomy      with 46% stenosis in the right by a recent Doppler.  5. Hyperlipidemia.  6. Status post surgery for sleep apnea.   RECOMMENDATIONS:  Mr. Durkee has done quite well.  He quit smoking after  his bypass surgery and he has lost 20 pounds recently.  We will plan to  give him a prescription for 80 of Lipitor with recommendation that he  take a half a tablet a day.  We will give him some samples for Zetia to  continue through the donut hole.  We will cut back his Lopressor to half  the dose.  His wife will call and let us know the dose he is currently taking at  home.  I will see him back in 6 months.  We will plan to get a lipid and  liver profile in January at Dr. Elisabeth Cara office.      [name], MD, [facility]  Electronically Signed    BRB/MedQ  DD: 09/28/2008  DT: 09/29/2008  Job #: [id number]

## 2011-05-14 NOTE — Procedures (Signed)
LOWER EXTREMITY ARTERIAL EVALUATION-SINGLE LEVEL   INDICATION:  Followup of known PDD.  Patient complains of bilateral  claudication at one block.  Patient denies rest pain.   HISTORY:  Diabetes:  No.  Cardiac:  CAD.  MI in 1992.  CABG in 1992.  Hypertension:  Yes.  Smoking:  Quit.  Previous Surgery:  Multiple lower extremity revascularizations.  Right  CEA with DPA on 10/04/92 by Dr. Madilyn Fireman.   RESTING SYSTOLIC PRESSURES: (ABI)                          RIGHT                LEFT  Brachial:               130                  136  Anterior tibial:        52                   56  Posterior tibial:       66 (0.49)            64 (0.47)  Peroneal:  DOPPLER WAVEFORM ANALYSIS:  Anterior tibial:        Monophasic           Monophasic  Posterior tibial:       Monophasic           Monophasic  Peroneal:   PREVIOUS ABI'S:  Date: 07/21/06  RIGHT:  0.54  LEFT:  0.54   IMPRESSION:  Essentially stable ankle brachial indices from study done  on 07/21/06.   ___________________________________________  Larina Earthly, M.D.   PB/MEDQ  D:  05/13/2008  T:  05/13/2008  Job:  [id number]

## 2011-05-14 NOTE — Assessment & Plan Note (Signed)
[facility] HEALTHCARE                            CARDIOLOGY OFFICE NOTE   NAME:Boman, LORING LISKEY                        MRN:          [id number]  DATE:02/02/2008                            DOB:          30-Jan-1941    PRIMARY CARE PHYSICIAN:  Julieanne Manson, M.D., in [place].   CLINICAL HISTORY:  Mr. Difrancesco is a 70 year old and returns for  management of his coronary heart disease.  He had bypass surgery in 1992  and had a nonischemic Myoview scan in 2006.  He has had no recent chest  pain, shortness of breath or palpitations.   When I saw him in December, his lipid profile was not at target, and his  blood pressure was high and we added Lotensin 20 mg daily  and Zetia 10  mg daily.  He has done well since that time.   PAST MEDICAL HISTORY:  His past medical history is significant for  peripheral vascular disease, and he has had prior revascularization  procedures.  He still gets some calf claudication, when he walks up a  hill.  He also has had previous right carotid endarterectomy and has  known occlusion of the left carotid artery.  He has reduced indices in  both legs, which are 54% bilaterally.  Also, has a history of  hyperlipidemia.   CURRENT MEDICATIONS:  Include Verapamil 240 grams daily, MiraLax,  aspirin, garlic, ranitidine, Lipitor 80 milligrams daily, fish oil,  Lotensin 20 mg daily, Zetia 10 mg daily, Ambien and Gingko.   EXAMINATION:  VITAL SIGNS:  On examination, blood pressure was 148/70  and pulse 59 and regular.  There is no venous distension.  The carotid  pulses were full with right coronary endarterectomy scar and bilateral  bruits.  CHEST:  Clear without rales or rhonchi.  HEART:  Rhythm is regular.  Short systolic murmur, left sternal edge.  ABDOMEN:  Soft, normal bowel sounds.  Peripherals were full.  There was  no peripheral edema.   Electrocardiogram was normal.   IMPRESSION:  1. Coronary artery status post coronary bypass  graft surgery, 1992 now      stable.  2. Good left ventricular function.  3. Peripheral vascular disease status post multiple revascularization      procedures, with decreased indices bilaterally and calf      claudication.  4. Carotid artery status post right carotid endarterectomy, known      total occlusion of the left carotid.  5. Hyperlipidemia, improved with the addition of Zetia.  6. Status post surgery for sleep apnea.   RECOMMENDATIONS:  Mr. Talton is doing well.  He has lost 5 pounds on his  home scale and has a goal of getting down from his current weight 176 to  165 pounds.  He is hopeful he can get  off some of the medications later.  Will plan to see him back in  followup in a year, and I suggest that he see Dr. Sullivan Lone in about 4 to  6 months.     [name], MD, [facility]  Electronically Signed  BRB/MedQ  DD: 02/02/2008  DT: 02/03/2008  Job #: [id number]

## 2011-05-14 NOTE — Procedures (Signed)
CAROTID DUPLEX EXAM   INDICATION:  Followup of carotid artery disease.  Patient is currently  asymptomatic.   HISTORY:  Diabetes:  No.  Cardiac:  CAD, MI in 1992, CABG in 1992.  Hypertension:  Yes.  Smoking:  Quit.  Previous Surgery:  Multiple lower extremity revascularizations.  Right  CEA with DPA on 10/04/92 by Dr. Madilyn Fireman.  CV History:  Amaurosis Fugax No, Paresthesias No, Hemiparesis No                                       RIGHT             LEFT  Brachial systolic pressure:         130               136  Brachial Doppler waveforms:         WNL               WNL  Vertebral direction of flow:        Not visualized    Antegrade  DUPLEX VELOCITIES (cm/sec)  CCA peak systolic                   96                67  ECA peak systolic                   242               559  ICA peak systolic                   179               Occluded  ICA end diastolic                   43                Occluded  PLAQUE MORPHOLOGY:                  Mixed, calcific   Mixed, calcific  PLAQUE AMOUNT:                      Moderate          Severe  PLAQUE LOCATION:                    DCCA, ICA, ECA    ICA, ECA   IMPRESSION:  1. Right 40-59% internal carotid artery stenosis.  2. Known occlusion of left internal carotid artery.  3. Bilateral external carotid artery stenoses, left > right.  4. Right vertebral artery not visualized.  5. Left vertebral artery with antegrade flow.  6. Study essentially unchanged from that on 01/20/06.   ___________________________________________  Larina Earthly, M.D.   PB/MEDQ  D:  05/13/2008  T:  05/13/2008  Job:  [id number]

## 2011-05-14 NOTE — Assessment & Plan Note (Signed)
[facility] HEALTHCARE                            CARDIOLOGY OFFICE NOTE   NAME:Romito, ADELAIDO NICKLAUS                        MRN:          [id number]  DATE:12/01/2007                            DOB:          Apr 14, 1941    PRIMARY CARE PHYSICIAN:  Dr. Franne Forts at [facility] and [place],  [place].   CLINICAL HISTORY:  Mr. Gains is 70 years old and returned for  management of his coronary heart disease and vascular disease.  He had  bypass surgery in 1992 and was last evaluated in 2006 with a Myoview  scan which showed no evidence of ischemia and good LV function.  He says  he has had no recent chest pain, shortness of breath and has occasional  palpitations he relates to drinking extra caffeinated coffee.   He also has peripheral vascular disease and has had previous peripheral  vascular revascularization procedures.  He does get some cath pain if he  walks up a hill suddenly but he has had minimal symptoms from this  recently.   PAST MEDICAL HISTORY:  1. Previous right carotid endarterectomy with 40% - 60% stenosis on      the right by duplex a year and a half ago.  He has known occlusion      of the left carotid.  2. His peripheral vascular indices have been 0.54 on both sides.  3. Hyperlipidemia.   CURRENT MEDICATIONS:  Verapamil, pentoxifylline, MiraLax, aspirin,  Ranitidine, Lipitor and fish oil.   EXAMINATION:  Blood pressure is 182/88 and the pulse 62 and regular.  There was no venous distention.  Carotid pulses were full, there was a  right carotid endarterectomy scar and there were bilateral short bruits.  CHEST:  Clear.  CARDIAC:  Rhythm was regular, I could hear no murmurs.  ABDOMEN:  Soft with normal bowel sounds, there was no  hepatosplenomegaly.  EXTREMITIES:  Mild dependent cyanosis, pedal pulses were reduced but  ___________ present.  There was no peripheral edema.   Electrocardiogram showed sinus bradycardia, was otherwise normal.   IMPRESSION:  1. Coronary artery disease, status post coronary bypass graft surgery      in 1992, now stable.  2. Good left ventricular function.  3. Peripheral vascular disease, status post multiple revascularization      procedures with mild claudication.  4. Carotid disease, status post right carotid endarterectomy with 40%      - 60% stenosis by last Doppler and total occlusion on the left.  5. Hyperlipidemia.  6. Status post surgery for sleep apnea.   RECOMMENDATIONS:  I think Mr. Leonhardt is doing fairly well from a cardiac  standpoint.  His blood pressure is quite high today and we will recheck  that and make adjustments if needed.  He has not had a Doppler study in  almost 2 years so we will plan to recheck those.  We will get lab work  when he comes in for his Doppler studies including a lipid, liver, CBC  and BMP.  I will plan to see him back in followup in a year.  [name], MD, [facility]  Electronically Signed    BRB/MedQ  DD: 12/01/2007  DT: 12/01/2007  Job #: [id number]

## 2011-05-16 ENCOUNTER — Ambulatory Visit: Payer: Self-pay

## 2011-05-17 NOTE — Assessment & Plan Note (Signed)
[facility] HEALTHCARE                            CARDIOLOGY OFFICE NOTE   NAME:Clifford Aguilar, Clifford Aguilar                        MRN:          [id number]  DATE:12/15/2006                            DOB:          12/06/1941    PRIMARY CARE PHYSICIAN:  Dr. Julieanne Manson and Toni Arthurs, P.A.  Vascular surgeon Gretta Began, M.D.   CLINICAL HISTORY:  Mr.  Aguilar is 70 year old and had bypass surgery in  1992.  He was evaluated preoperatively last year with a Myoview scan  which showed no evidence of ischemia.  He has good left ventricular  function.   He has been doing well from the standpoint of his heart.  He has had no  chest pain, shortness of breath, or palpitations.   He also has peripheral vascular disease.  He has had a right carotid  endarterectomy and his last duplex was in January of this year, at which  time he had 40% to 59% stenosis on the right side.  He has known total  occlusion of the left internal carotid artery.  He also has peripheral  vascular disease with bilateral calf claudication.  He has had multiple  peripheral procedures.  His last Doppler studies were done 6 months ago  and showed indices of 0.54 on both sides.   PAST MEDICAL HISTORY:  Is also significant for hyperlipidemia.   CURRENT MEDICATIONS:  Include:  Multivitamins, Verelan, Trental,  MiraLax, doxazosin and now Flomax, aspirin, ranitidine, Lipitor and Fish  oil.   SOCIAL HISTORY:  His stepdaughter now works in cardiac surgery down at  [employer].   On physical examination blood pressure is 140/64, the pulse 56 and  regular.  There is no venous distention.  There was a right carotid endarterectomy  scar.  There were bilateral carotid bruits.  CHEST:  Was clear without rales or wheezes.  CARDIAC:  Rhythm was regular. The heart sounds were normal.  I could  hear no murmurs or gallops.  ABDOMEN:  Was soft, with normal bowel sounds.  There is no  hepatosplenomegaly.  I had difficulty  feeling pedal pulses.  There is no peripheral edema.   An electrocardiogram showed sinus bradycardia and was otherwise normal.   IMPRESSION:  1. Coronary artery disease - status post coronary artery bypass grafts      1992, now stable.  2. Good left ventricular function.  3. Peripheral vascular disease with multiple procedures in the lower      extremity and bilateral calf claudication with indices as described      above.  4. Carotid disease with status post right carotid endarterectomy with      40% to 59% stenosis on the right by recent Doppler and total      occlusion on the left.  5. Hyperlipidemia.  6. Status post surgery for sleep apnea.   RECOMMENDATIONS:  Clifford Aguilar continues to do amazingly well, considering  the extent of his disease.  He is scheduled to see Toni Arthurs early  next year and he will get his cholesterol panel.  I told him are targets  were less than 80 for LDL and greater than 40 for HDL.  He is having  some problems with impotence, both with sustaining an erection and with  ejaculation.  He thinks this coincided with treatment of verapamil.  I  do not see this listed as a common side effect, but we will give him a  trial of Norvasc 5 instead of verapamil for a month and see if this  helps.  If this helps then we will leave him on Norvasc.  If it does not  help we will put him back on verapamil and ask him to see his urologist.  I plan to see him back in followup in a year.     [name], MD, [facility]  Electronically Signed    BRB/MedQ  DD: 12/15/2006  DT: 12/15/2006  Job #: [id number]

## 2011-05-17 NOTE — Op Note (Signed)
Clifford Aguilar, Clifford Aguilar                 ACCOUNT NO.:  [id number]   MEDICAL RECORD NO.:  [id number]          PATIENT TYPE:  AMB   LOCATION:  [facility]                          FACILITY:  [facility]   PHYSICIAN:  Lubertha Basque. Dalldorf, M.D.DATE OF BIRTH:  07-21-1941   DATE OF PROCEDURE:  12/10/2005  DATE OF DISCHARGE:                                 OPERATIVE REPORT   PREOPERATIVE DIAGNOSES:  1.  Right shoulder impingement.  2.  Right shoulder partial rotator cuff tear.   POSTOPERATIVE DIAGNOSES:  1.  Right shoulder impingement.  2.  Right shoulder partial rotator cuff tear.   PROCEDURES:  1.  Right shoulder arthroscopic acromioplasty.  2.  Right shoulder arthroscopic acromioclavicular joint resection.  3.  Right shoulder arthroscopic debridement.   ANESTHESIA:  General and block.   ATTENDING SURGEON:  Lubertha Basque. Jerl Santos, M.D.   ASSISTANT:  Lindwood Qua, P.A.   INDICATIONS FOR PROCEDURE:  The patient is a 70 year old man with six or  eight months of right shoulder pain, which has persisted despite oral anti-  inflammatories, exercise program, and two subacromial injections.  He has  pain which limits his ability to rest and use his arm.  He has had an MRI  scan done, which shows things consistent with impingement due to the shape  of the acromion and AC joint, and he also had some partial-thickness tearing  of his rotator cuff.  He is offered an arthroscopy.  Informed operative  consent was obtained after discussion about the possible complications of,  reaction to reaction to anesthesia and infection.  He does have a  significant cardiac history and a discussion was entered into about possible  cardiac effects of this surgery, and he was cleared by his cardiologist  prior to proceeding.   DESCRIPTION OF PROCEDURE:  The patient was taken to the operating suite,  where a general anesthetic was applied without difficulty.  He was  positioned in a beach-chair position and prepped and draped in  normal  sterile fashion.  After administration of preop IV Ancef, an arthroscopy of  the right shoulder was performed through a total of three portals.  The  glenohumeral joint showed no degenerative changes.  The biceps tendon and  rotator cuff were benign from below.  In the subacromial space he had a  great deal of bursitis, addressed with a thorough bursectomy, and he had  some partial-thickness tearing of the rotator cuff, addressed with  debridement, but certainly no significant tear was visualized.  He had a  prominent subacromial morphology, addressed with an acromioplasty back to a  flat surface.  This was done with the bur in the lateral position, followed  by transfer of the bur to the posterior position.  He also had bone-on-bone  contact at the [facility] joint and a large spur underneath the clavicle at this  location, and a formal AC decompression was done with a bur.  The shoulder  was thoroughly irrigated, followed by placement of some Marcaine.  Simple  sutures of nylon was used to loosely reapproximate the portals, followed by  Adaptic and  a dry gauze dressing with some tape.  Estimated blood loss and  intraoperative fluids can be obtained from anesthesia records.  It should be  noted that he experienced a brief period of tachycardia during the case,  which resolved spontaneously.  His pressure remained stable throughout.   DISPOSITION:  The patient was extubated in the operating room and taken to  the recovery room in stable addition.  Plans were for him to go home the  same day and to follow up in the office in less than a week.  There was the  possibility that anesthesia might require an admission for overnight  observation and that will be certainly understandable, but at the time of  this dictation they did not feel it was going to be necessary, though they  continue to observe him in the recovery room.  Assuming he does go home  tonight, I will certainly contact him by  phone tonight.      Lubertha Basque Jerl Santos, M.D.  Electronically Signed     PGD/MEDQ  D:  12/10/2005  T:  12/11/2005  Job:  [id number]

## 2011-06-12 ENCOUNTER — Telehealth: Payer: Self-pay | Admitting: *Deleted

## 2011-06-12 NOTE — Telephone Encounter (Signed)
Spoke to pt's wife regarding BP results that were dropped off in office. Pt was last seen in office 02/2011 and has not made any med changes since then, and denies added stress or change in activity or any outside factors. However, pt's SBP has been reportedly trending up. His BP readings are for 2 weeks and are in your folder, please review and advise, thanks.

## 2011-06-12 NOTE — Telephone Encounter (Signed)
Could start HCTZ 12.5 mg daily for one week Monitor BP If high, HCTZ 25 mg one week. Monitor Bp Monitor heart rate for me. He should be on a low dose b-blocker for his heart if heart rate not too low.  If HCTZ does not work, could try long acting NTG (imdur)

## 2011-06-13 NOTE — Telephone Encounter (Signed)
Attempted to contact pt, LMOM TCB.  

## 2011-06-14 ENCOUNTER — Other Ambulatory Visit: Payer: Self-pay | Admitting: Cardiovascular Disease

## 2011-06-14 MED ORDER — HYDROCHLOROTHIAZIDE 25 MG PO TABS: 12.5000 mg | ORAL_TABLET | Freq: Every day | ORAL | Status: DC

## 2011-06-14 NOTE — Telephone Encounter (Signed)
Spoke to pt's wife, notified of msg below. Pt will start HCTZ 12.5 qd and monitor BP/HR and call in 1 week with results. If BP remains elevated at that time, we will incr to 25mg  qd. Rx sent to pharmacy.

## 2011-09-03 ENCOUNTER — Encounter: Payer: Self-pay | Admitting: Cardiovascular Disease

## 2011-09-05 ENCOUNTER — Ambulatory Visit (INDEPENDENT_AMBULATORY_CARE_PROVIDER_SITE_OTHER): Payer: Medicare Other | Admitting: Cardiovascular Disease

## 2011-09-05 ENCOUNTER — Encounter: Payer: Self-pay | Admitting: Cardiovascular Disease

## 2011-09-05 DIAGNOSIS — I6529 Occlusion and stenosis of unspecified carotid artery: Secondary | ICD-10-CM

## 2011-09-05 DIAGNOSIS — I2510 Atherosclerotic heart disease of native coronary artery without angina pectoris: Secondary | ICD-10-CM

## 2011-09-05 DIAGNOSIS — E785 Hyperlipidemia, unspecified: Secondary | ICD-10-CM

## 2011-09-05 DIAGNOSIS — I10 Essential (primary) hypertension: Secondary | ICD-10-CM

## 2011-09-05 MED ORDER — HYDRALAZINE HCL 50 MG PO TABS: 50.0000 mg | ORAL_TABLET | Freq: Three times a day (TID) | ORAL | Status: DC

## 2011-09-05 MED ORDER — CLONIDINE HCL 0.2 MG PO TABS: 0.2000 mg | ORAL_TABLET | Freq: Two times a day (BID) | ORAL | Status: DC

## 2011-09-05 NOTE — Assessment & Plan Note (Signed)
Blood pressure is very elevated today. He was previously elevated that he did not want to change his medications on the last visit. We will start clonidine 0.2 mg b.i.d. He is leaving town for 2 weeks. It is unable to tolerate clonidine or if he does not have improvement of blood pressure, we have also provided a prescription for hydralazine 50 mg t.i.d..

## 2011-09-05 NOTE — Progress Notes (Signed)
Patient ID: Clifford Aguilar, male    DOB: 01-16-1941, 69 y.o.   MRN: [id number]  HPI Comments:  70 years old with CAD, peripheral vascular disease,  bypass surgery in 1992,  carotid endarterectomy on the right and has had multiple surgical procedures on his lower extremity by Dr. Arbie Cookey. 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 Most recent ultrasound of his carotid shows 40-59% disease on the right, chronically occluded left carotid.  2012 ABIs were moderately depressed in his lower extremities. he does have some symptoms of claudication but this has been ongoing for long periods of time.   He is able to exert himself without any significant chest pain. He has not had a cardiac catheterization or stress test in 10 years. He believes he had a catheterization 10 years ago, 3 catheterizations since his bypass surgery 20 years ago.   He took himself off Crestor as he believes it was making his memory worse. He reports that he does take simvastatin 40 mg daily. He has been taking various over-the-counter medications for erectile dysfunction and wonders if it could be pushing his blood pressure upwards. He has noticed systolic pressures in the 160-170 range a regular basis though he only takes the erectile dysfunction medications rarely. He's had no chest pain shortness breath or palpitations. He quit smoking after his bypass surgery in 1992.      EKG shows normal sinus rhythm with rate 83 beats per minute, nonspecific ST and T wave changes in V6, II, II AVF    Outpatient Encounter Prescriptions as of 09/05/2011  Medication Sig Dispense Refill  . aspirin 81 MG tablet Take 81 mg by mouth daily.        . benazepril (LOTENSIN) 20 MG tablet Take 20 mg by mouth daily.        Marland Kitchen co-enzyme Q-10 30 MG capsule Take 30 mg by mouth daily.        Marland Kitchen ezetimibe (ZETIA) 10 MG tablet Take 1 tablet (10 mg total) by mouth daily.  90 tablet  3  . fish oil-omega-3  fatty acids 1000 MG capsule Take 1 g by mouth 3 (three) times daily.        . Garlic 1000 MG CAPS Take 7 capsules by mouth daily.        . hydrochlorothiazide 25 MG tablet Take 0.5 tablets (12.5 mg total) by mouth daily.  30 tablet  6  . nitroGLYCERIN (NITROLINGUAL) 0.4 MG/SPRAY spray Place 1 spray under the tongue every 5 (five) minutes as needed.        . nortriptyline (PAMELOR) 25 MG capsule Take 25 mg by mouth at bedtime. For sleeping       . polyethylene glycol (MIRALAX / GLYCOLAX) packet Take 17 g by mouth daily.        . Ranitidine HCl (RANITIDINE 75 PO) Take 1 tablet by mouth daily.        . simvastatin (ZOCOR) 40 MG tablet Take 1 tablet (40 mg total) by mouth every evening.  90 tablet  3  . verapamil (COVERA HS) 240 MG (CO) 24 hr tablet Take 240 mg by mouth daily.        Marland Kitchen Zn-Pyg Afri-Nettle-Saw Palmet (SAW PALMETTO COMPLEX PO) Take 2 tablets by mouth daily.         Review of Systems  Constitutional: Negative.   HENT: Negative.   Eyes: Negative.   Respiratory: Negative.   Cardiovascular: Negative.  Gastrointestinal: Negative.   Musculoskeletal: Negative.   Skin: Negative.   Neurological: Negative.   Hematological: Negative.   Psychiatric/Behavioral: Negative.   All other systems reviewed and are negative.    BP 188/83  Pulse 83  Ht 5\' 9"  (1.753 m)  Wt 174 lb (78.926 kg)  BMI 25.70 kg/m2  Physical Exam  Nursing note and vitals reviewed. Constitutional: He is oriented to person, place, and time. He appears well-developed and well-nourished.  HENT:  Head: Normocephalic.  Nose: Nose normal.  Mouth/Throat: Oropharynx is clear and moist.  Eyes: Conjunctivae are normal. Pupils are equal, round, and reactive to light.  Neck: Normal range of motion. Neck supple. No JVD present.  Cardiovascular: Normal rate, regular rhythm, S1 normal, S2 normal, normal heart sounds and intact distal pulses.  Exam reveals no gallop and no friction rub.   No murmur heard. Pulmonary/Chest:  Effort normal and breath sounds normal. No respiratory distress. He has no wheezes. He has no rales. He exhibits no tenderness.  Abdominal: Soft. Bowel sounds are normal. He exhibits no distension. There is no tenderness.  Musculoskeletal: Normal range of motion. He exhibits no edema and no tenderness.  Lymphadenopathy:    He has no cervical adenopathy.  Neurological: He is alert and oriented to person, place, and time. Coordination normal.  Skin: Skin is warm and dry. No rash noted. No erythema.  Psychiatric: He has a normal mood and affect. His behavior is normal. Judgment and thought content normal.           Assessment and Plan

## 2011-09-05 NOTE — Assessment & Plan Note (Addendum)
We have encouraged him to stay on his simvastatin 40 mg daily. Would recommend he have his cholesterol checked when he follows up with Dr. Sullivan Lone. Cholesterol one year ago was 185 which is above goal.

## 2011-09-05 NOTE — Patient Instructions (Addendum)
You are doing well. Please start clonidine one in the Am and PM If blood pressure continues to be elevated, start hydralazine three times a day Please call us if you have new issues that need to be addressed before your next appt.  We will call you for a follow up Appt. In 3 months  Schedule for Liv/Lip in 3 months.

## 2011-09-05 NOTE — Assessment & Plan Note (Signed)
Currently with no symptoms of angina. No further workup at this time. Continue current medication regimen. 

## 2011-09-05 NOTE — Assessment & Plan Note (Signed)
Carotid ultrasound earlier this year. We'll plan for carotid ultrasound repeat early in 2013.

## 2011-09-09 ENCOUNTER — Telehealth: Payer: Self-pay

## 2011-09-09 ENCOUNTER — Telehealth: Payer: Self-pay | Admitting: *Deleted

## 2011-09-09 ENCOUNTER — Other Ambulatory Visit: Payer: Self-pay

## 2011-09-09 DIAGNOSIS — I10 Essential (primary) hypertension: Secondary | ICD-10-CM

## 2011-09-09 DIAGNOSIS — I6529 Occlusion and stenosis of unspecified carotid artery: Secondary | ICD-10-CM

## 2011-09-09 NOTE — Telephone Encounter (Signed)
The patient was told to hold the clonidine & start on hydralazine 25 to 50 mg tid.

## 2011-09-09 NOTE — Telephone Encounter (Signed)
Dr. Mariah Milling spoke to Lavone Neri (wife) to have patient hold the clonidine & start on the Hydralazine 25 to 50 mg tid.

## 2011-09-09 NOTE — Telephone Encounter (Signed)
Wife wants to know what next to do. They are in [place]. I told her to not give husband any clonidine and I will route this message to Dr Mariah Milling. Wife says ok and states to leave any message on cell phone if she does not pick up. She will return the call [place] area.

## 2011-09-16 ENCOUNTER — Telehealth: Payer: Self-pay | Admitting: *Deleted

## 2011-09-16 ENCOUNTER — Telehealth: Payer: Self-pay

## 2011-09-16 DIAGNOSIS — I739 Peripheral vascular disease, unspecified: Secondary | ICD-10-CM

## 2011-09-16 NOTE — Telephone Encounter (Signed)
The patient is scheduled for a carotid u/s on Sept. 28,2012.  The patient wanted to know since he had surgery on legs as well could we do a repeat upper & lower u/s?

## 2011-09-16 NOTE — Telephone Encounter (Signed)
After discussing with Dr. Mariah Milling, notified wife to incr pt's hydralazine to 1 1/2 tablets (75mg ) TID, and schedule an appt with an eye doctor. Pt and wife ok with this. Advised he call me back with any change in symptoms in the meantime.

## 2011-09-16 NOTE — Telephone Encounter (Signed)
Pt still having symptoms of blurry vision in LEFT eye only, periodically on a daily basis. He was changed from Clonidine to Hydralazine for this reason, thinking the Clonididne was the cause. Pt now takes Hydralazine 50mg  TID, BP after med today 134/59. BP prior to taking 210/85. BP averages 150s-160s/60s-80 since taking Hydralazine per pt. Pt denies any other symptoms. No h/o eye problems, does not take drops etc, except he did have cataract surgery in both eyes >42yrs ago. No other new meds started. I told pt's wife, if symptoms unchanged after med change, its likely blurry vision either from high BP or another underlying cause. Will discuss with Dr. Mariah Milling and call back today. They are currently out of town, will be back home Sun 09/22/11.

## 2011-09-17 NOTE — Telephone Encounter (Signed)
Ok to add lower extr ABIs

## 2011-09-18 NOTE — Telephone Encounter (Signed)
Pt notified. Will put in orders and have Asher Muir schedule tomorrow.

## 2011-09-19 NOTE — Telephone Encounter (Signed)
Clifford Aguilar can you please call pt and schedule, thanks.

## 2011-09-23 ENCOUNTER — Telehealth: Payer: Self-pay | Admitting: *Deleted

## 2011-09-23 NOTE — Telephone Encounter (Signed)
Would schedule a different day so  Can read u/s He needs to closely monitor BP for the next week. Heart rate as well

## 2011-09-23 NOTE — Telephone Encounter (Signed)
We had recently incr pt's hydralazine to 75mg  TID for elev BP. (He also has had issues with eye which we told to see eye doctor and he has carotid u/s this fri). While on vacation, [place] walking in [place] and collapsed, was sent to ER by EMS in [place]. Pt's wife has all paperwork, and only reason they thought was that his BP was too low. SBP today in 150s-178. Do you want me to schedule pt after u/s done, then he can get results and also evaluate BP at that time? Please advise.

## 2011-09-24 NOTE — Telephone Encounter (Signed)
Called and LM with instructions below regarding monitor BP/HR.  Asher Muir can you please schedule pt for f/u with TG After his u/s appt on 10/2. Thanks.

## 2011-09-27 ENCOUNTER — Encounter: Payer: Medicare Other | Admitting: *Deleted

## 2011-09-30 ENCOUNTER — Encounter: Payer: Self-pay | Admitting: Cardiovascular Disease

## 2011-10-01 ENCOUNTER — Encounter (INDEPENDENT_AMBULATORY_CARE_PROVIDER_SITE_OTHER): Payer: Medicare Other | Admitting: *Deleted

## 2011-10-01 DIAGNOSIS — I739 Peripheral vascular disease, unspecified: Secondary | ICD-10-CM

## 2011-10-01 DIAGNOSIS — H53129 Transient visual loss, unspecified eye: Secondary | ICD-10-CM

## 2011-10-01 DIAGNOSIS — I6529 Occlusion and stenosis of unspecified carotid artery: Secondary | ICD-10-CM

## 2011-10-04 ENCOUNTER — Ambulatory Visit (INDEPENDENT_AMBULATORY_CARE_PROVIDER_SITE_OTHER): Payer: Medicare Other | Admitting: Cardiovascular Disease

## 2011-10-04 ENCOUNTER — Encounter: Payer: Self-pay | Admitting: Cardiovascular Disease

## 2011-10-04 DIAGNOSIS — E785 Hyperlipidemia, unspecified: Secondary | ICD-10-CM

## 2011-10-04 DIAGNOSIS — I25810 Atherosclerosis of coronary artery bypass graft(s) without angina pectoris: Secondary | ICD-10-CM

## 2011-10-04 DIAGNOSIS — I10 Essential (primary) hypertension: Secondary | ICD-10-CM

## 2011-10-04 DIAGNOSIS — I6529 Occlusion and stenosis of unspecified carotid artery: Secondary | ICD-10-CM

## 2011-10-04 DIAGNOSIS — R0989 Other specified symptoms and signs involving the circulatory and respiratory systems: Secondary | ICD-10-CM

## 2011-10-04 DIAGNOSIS — I2510 Atherosclerotic heart disease of native coronary artery without angina pectoris: Secondary | ICD-10-CM

## 2011-10-04 NOTE — Patient Instructions (Addendum)
You are doing well. Please increase the hydralazine to 50 mg three times a day Please take the verapamil in the morning Take the benazepril in the evening For emergency high blood pressure, take an extra hydralazine Please call us if you have new issues that need to be addressed before your next appt.   Your physician recommends that you schedule a follow-up appointment in: 3 months   Your physician has requested that you have an abdominal aorta duplex. During this test, an ultrasound is used to evaluate the aorta. Allow 30 minutes for this exam. Do not eat after midnight the day before and avoid carbonated beverages

## 2011-10-04 NOTE — Assessment & Plan Note (Signed)
Goal total cholesterol less than 150, LDL less than 70. Would continue his current medication regimen and and followup with Dr. Sullivan Lone for routine blood work.

## 2011-10-04 NOTE — Assessment & Plan Note (Signed)
Blood pressure is very elevated on today's visit and since he recently changed his hydralazine dosing. We have suggested he increase the dose to 50 mg t.i.d. And contact us with blood pressure numbers in one week. I suspect we will need to increase the dose back to 75 mg t.i.d..   I do not think the recent lower extremity weakness while he was in [place] was from hypotension ( from his usual occasions). I suspect it could have been from drinking 3 beers and then taking a nitroglycerin prior to the episode.

## 2011-10-04 NOTE — Assessment & Plan Note (Signed)
Currently with no symptoms of angina. No further workup at this time. Continue current medication regimen. 

## 2011-10-04 NOTE — Assessment & Plan Note (Signed)
Stable carotid arterial disease. We'll continue aggressive cholesterol management. 

## 2011-10-04 NOTE — Progress Notes (Signed)
Patient ID: Clifford Aguilar, male    DOB: [birth year], 70 y.o.   MRN: [id number]  HPI Comments: 70 years old with CAD, peripheral vascular disease,  bypass surgery in 1992,  carotid endarterectomy on the right and has had multiple surgical procedures on his lower extremity by Dr. Arbie Cookey. 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 Most recent ultrasound of his carotid shows 40-59% disease on the right, chronically occluded left carotid.  2012 ABIs were moderately depressed in his lower extremities. he does have some symptoms of claudication but this has been ongoing for long periods of time.  He was recently in [place] at the [employer]. Prior to this he had 3 beers. His head did not feel well at the [place] museum and he took a nitroglycerin. Shortly after, his legs felt numb and he went weak from below the waist. He had difficulty standing and he was taken to the emergency room. It was felt that his blood pressure was too low. He reports a systolic pressure of 119. Since then his hydralazine has been cut from 75 mg 3 times a day  To 50 mg 3 times a day. He reports he is only been taking 25 mg 3 times a day. Most of his blood pressure measurements since then have been very elevated with systolic pressures in the 170 range, occasionally 200. He denies any other symptoms and otherwise feels well    He is able to exert himself without any significant chest pain. He has not had a cardiac catheterization or stress test in 10 years. He believes he had a catheterization 10 years ago, 3 catheterizations since his bypass surgery 20 years ago.    He took himself off Crestor as he believes it was making his memory worse. He reports that he does take simvastatin 40 mg daily. He has been taking various over-the-counter medications for erectile dysfunction and wonders if it could be pushing his blood pressure upwards. He has noticed systolic pressures in the 160-170  range a regular basis though he only takes the erectile dysfunction medications rarely. He's had no chest pain shortness breath or palpitations. He quit smoking after his bypass surgery in 1992.      EKG shows normal sinus rhythm with rate 78 beats per minute, nonspecific ST and T wave changes in V5 and V6       Outpatient Encounter Prescriptions as of 10/04/2011  Medication Sig Dispense Refill  . aspirin 81 MG tablet Take 81 mg by mouth daily.        . benazepril (LOTENSIN) 20 MG tablet Take 20 mg by mouth daily.        Marland Kitchen co-enzyme Q-10 30 MG capsule Take 30 mg by mouth daily.        Marland Kitchen ezetimibe (ZETIA) 10 MG tablet Take 1 tablet (10 mg total) by mouth daily.  90 tablet  3  . fish oil-omega-3 fatty acids 1000 MG capsule Take 1 g by mouth 3 (three) times daily.        . Garlic 1000 MG CAPS Take 7 capsules by mouth daily.        . hydrALAZINE (APRESOLINE) 50 MG tablet Take 25 mg by mouth 3 (three) times daily.        . hydrochlorothiazide 25 MG tablet Take 0.5 tablets (12.5 mg total) by mouth daily.  30 tablet  6  . nitroGLYCERIN (NITROLINGUAL) 0.4 MG/SPRAY spray Place 1 spray under  the tongue every 5 (five) minutes as needed.        . nortriptyline (PAMELOR) 25 MG capsule Take 25 mg by mouth at bedtime. For sleeping       . polyethylene glycol (MIRALAX / GLYCOLAX) packet Take 17 g by mouth daily.        . Ranitidine HCl (RANITIDINE 75 PO) Take 1 tablet by mouth daily.        . simvastatin (ZOCOR) 40 MG tablet Take 1 tablet (40 mg total) by mouth every evening.  90 tablet  3  . verapamil (COVERA HS) 240 MG (CO) 24 hr tablet Take 240 mg by mouth daily.        Marland Kitchen Zn-Pyg Afri-Nettle-Saw Palmet (SAW PALMETTO COMPLEX PO) Take 2 tablets by mouth daily.           Review of Systems  Constitutional: Negative.   HENT: Negative.   Eyes: Negative.   Respiratory: Negative.   Cardiovascular: Negative.   Gastrointestinal: Negative.   Musculoskeletal: Negative.   Skin: Negative.   Neurological:  Negative.   Hematological: Negative.   Psychiatric/Behavioral: Negative.   All other systems reviewed and are negative.    BP 171/74  Pulse 77  Ht 5\' 9"  (1.753 m)  Wt 174 lb (78.926 kg)  BMI 25.70 kg/m2  Physical Exam  Nursing note and vitals reviewed. Constitutional: He is oriented to person, place, and time. He appears well-developed and well-nourished.  HENT:  Head: Normocephalic.  Nose: Nose normal.  Mouth/Throat: Oropharynx is clear and moist.  Eyes: Conjunctivae are normal. Pupils are equal, round, and reactive to light.  Neck: Normal range of motion. Neck supple. No JVD present.  Cardiovascular: Normal rate, regular rhythm, S1 normal, S2 normal, normal heart sounds and intact distal pulses.  Exam reveals no gallop and no friction rub.   No murmur heard. Pulmonary/Chest: Effort normal and breath sounds normal. No respiratory distress. He has no wheezes. He has no rales. He exhibits no tenderness.  Abdominal: Soft. Bowel sounds are normal. He exhibits no distension. There is no tenderness.  Musculoskeletal: Normal range of motion. He exhibits no edema and no tenderness.  Lymphadenopathy:    He has no cervical adenopathy.  Neurological: He is alert and oriented to person, place, and time. Coordination normal.  Skin: Skin is warm and dry. No rash noted. No erythema.  Psychiatric: He has a normal mood and affect. His behavior is normal. Judgment and thought content normal.           Assessment and Plan

## 2011-10-09 ENCOUNTER — Other Ambulatory Visit: Payer: Self-pay | Admitting: Cardiovascular Disease

## 2011-10-09 DIAGNOSIS — N529 Male erectile dysfunction, unspecified: Secondary | ICD-10-CM

## 2011-10-09 MED ORDER — TADALAFIL 20 MG PO TABS: 20.0000 mg | ORAL_TABLET | Freq: Every day | ORAL | Status: AC | PRN

## 2011-10-11 ENCOUNTER — Encounter: Payer: Medicare Other | Admitting: *Deleted

## 2011-10-15 ENCOUNTER — Encounter (INDEPENDENT_AMBULATORY_CARE_PROVIDER_SITE_OTHER): Payer: Medicare Other | Admitting: *Deleted

## 2011-10-15 DIAGNOSIS — R0989 Other specified symptoms and signs involving the circulatory and respiratory systems: Secondary | ICD-10-CM

## 2011-10-15 DIAGNOSIS — I10 Essential (primary) hypertension: Secondary | ICD-10-CM

## 2011-10-15 DIAGNOSIS — I701 Atherosclerosis of renal artery: Secondary | ICD-10-CM

## 2011-10-17 ENCOUNTER — Encounter: Payer: Self-pay | Admitting: Cardiovascular Disease

## 2011-10-17 ENCOUNTER — Other Ambulatory Visit: Payer: Self-pay

## 2011-10-23 ENCOUNTER — Encounter: Payer: Self-pay | Admitting: Cardiovascular Disease

## 2011-10-25 ENCOUNTER — Telehealth: Payer: Self-pay | Admitting: *Deleted

## 2011-10-25 NOTE — Telephone Encounter (Signed)
Wife called stating pt's BP is still elevated after increasing hydralazine 50mg  to 1 1/2 tab QID. BP running 155/185/57-68. She was told to call back with numbers after this increase. He is also taking the verapamil in the AM and Benazepril in PM as instructed. Please advise.

## 2011-10-27 NOTE — Telephone Encounter (Signed)
He has renal artery stenosis and needs follow up with PV doc (cooper) For meds, we need to hold his ace inhibitor (benazepril) Increase imdur to 60 BID and increase hydralazine to 100 mg TID If still high, would add bystolic 5 mg titrating to 10 mg

## 2011-10-28 MED ORDER — ISOSORBIDE MONONITRATE ER 30 MG PO TB24: 30.0000 mg | ORAL_TABLET | Freq: Two times a day (BID) | ORAL | Status: DC

## 2011-10-28 NOTE — Telephone Encounter (Signed)
After discussing with Dr. Mariah Milling, pt will start Imdur 30mg  BID, he is not on this medication at this time. Called pt's wife back, no answer LMOM TCB. (wife wanted [place] to call her cell they are out of town at the moment.) When returns call, will notify pt to incr Hydralazine 100mg  TID, start Imdur 30mg  BID, and if BP still elevated after this, we can start Bystolic 5mg  possibly titration to 10mg . Will get pt's pharmacy they want called in to when pt or wife call back. Also, per previous conversation, pt wants to see Dr. Arbie Cookey for vascular since he has seen him previously. Notified pt this will be fine.

## 2011-10-28 NOTE — Telephone Encounter (Signed)
Spoke to wife, she knows to have pt start imdur 30 bid, take hydralazine 100mg  TID, and to record BP numbers during that time and call me back on Friday with results. If BP still elevated will give him samples of bystolic 5 next week. Pt's wife ok with this. (Will call in Im[facility] to [facility] in [place] per pt, since out of town until [place] this week).

## 2011-11-01 ENCOUNTER — Telehealth: Payer: Self-pay | Admitting: *Deleted

## 2011-11-01 DIAGNOSIS — I701 Atherosclerosis of renal artery: Secondary | ICD-10-CM

## 2011-11-01 NOTE — Telephone Encounter (Signed)
Pt's wife calling stating since pt stopped Benazapril, BP has incr to 203/79, 204/79, 202/79.Marland KitchenMarland Kitchenprior to stopping benazapril BP was still high at 183/63-189/70. Pt did start Imdur 30mg  BID 3 days prior, I advised he incr to 60mg  BID per previous note. Will discuss with Dr. Mariah Milling. In process of getting pt appt with Dr. Arbie Cookey (he has seen before and requested to be referred there for renal artery stenosis). As long as TG ok with Imdur incr, will send in new Rx to [facility] pharmacy.

## 2011-11-04 NOTE — Telephone Encounter (Signed)
Would suggest to patient he have follow up with cooper or one of the interventional cardiologists. Don't think Early does stenting of the renal artery, only open surgery.  Would not recommend open surgery. Would increase imdur to 60 BID

## 2011-11-05 ENCOUNTER — Telehealth: Payer: Self-pay | Admitting: *Deleted

## 2011-11-05 MED ORDER — ISOSORBIDE MONONITRATE 60 MG PO TB24: 60.0000 mg | ORAL_TABLET | Freq: Two times a day (BID) | ORAL | Status: DC

## 2011-11-05 NOTE — Telephone Encounter (Signed)
Pt's wife called Dr. Bosie Helper office to cancel appt, and she said that he does this same procedure, and she and pt prefer to go there since they know him. She asked that we cancel appt with Dr. Excell Seltzer.

## 2011-11-05 NOTE — Telephone Encounter (Signed)
Pt notified of msg below. He has been taking Imdur 60 BID since 11/2, BP not much improved: 196/64, 197/72, 174/61, 190/67, 178/51. Pt has been scheduled with Dr. Excell Seltzer for 11/29 at 2:30, pt aware.

## 2011-11-08 ENCOUNTER — Encounter: Payer: Self-pay | Admitting: Vascular Surgery

## 2011-11-11 ENCOUNTER — Telehealth: Payer: Self-pay | Admitting: *Deleted

## 2011-11-11 NOTE — Telephone Encounter (Signed)
Pt's wife called back, she said pt has had increasing itch and rash on Left leg covering calf all way to foot. Pt has not started any other new medications, and has not been exposed to anything causing contact reaction that he can recall. Since taking Zyrtec the "itching has gotten better." He did take Imdur this AM. I told pt to stop Imdur, monitor BP in the meantime and call if any additional reaction symptoms occur, otherwise call back in about 2-3 days to report if rash better. If so we may need to send in new BP medication to replace Imdur. She states after adding Imdur his BP has improved but SBP still running in 180s (previously in 200s). Will discuss with Dr. Mariah Milling for recc.

## 2011-11-11 NOTE — Telephone Encounter (Signed)
Pt's wife called left msg on vm stating that since taking Imdur, pt has developed itchy rash ~3 days. Pt is taking Zyrtec. Attempted to contact pt, LMOM TCB.

## 2011-11-12 NOTE — Telephone Encounter (Signed)
We could try isosorbide dinitrate instead. Given TID with hydralazine Could also give as bidil though typically more expensive

## 2011-11-14 MED ORDER — ISOSORBIDE DINITRATE ER 40 MG PO TBCR: 40.0000 mg | EXTENDED_RELEASE_TABLET | Freq: Three times a day (TID) | ORAL | Status: DC

## 2011-11-14 NOTE — Telephone Encounter (Signed)
Attempted to contact pt, LMOM TCB.  

## 2011-11-14 NOTE — Telephone Encounter (Signed)
Pt's wife notified, pt's rash and itching have resolved. Pt will start Isosorbide dinitrate 40mg  TID, and will call back in about a week with BP numbers.

## 2011-11-18 ENCOUNTER — Telehealth: Payer: Self-pay | Admitting: *Deleted

## 2011-11-18 NOTE — Telephone Encounter (Signed)
Pt's wife stated that he has now been on Isosorbide Dinitrate for 3 days now and is Not allergic, (previously allergic to mononitrate). His BP Sat (day he started) 199/67, Sun 186/74, Mon (today) 180/64. They are meeting with Dr. Arbie Cookey next Tues for renal artery stenosis. I told her to continue to monitor BP this week, looks like improving daily, however, she did state pt by accident had taken double dose of dinitrate for 2 days, know he knows to take 1 tab TID. Pt will call back Wed this week with BP numbers, will forward to Dr. Mariah Milling.

## 2011-11-20 NOTE — Telephone Encounter (Signed)
Ok, will f/u with patient after few days of continuing Dinitrate.

## 2011-11-20 NOTE — Telephone Encounter (Signed)
If BP continues to run high, he can start two tabs TID

## 2011-11-22 ENCOUNTER — Telehealth: Payer: Self-pay

## 2011-11-22 NOTE — Telephone Encounter (Signed)
Was told to call on Wed with blood pressure readings; today blood pressure this am very high, relative had a melt down so blood pressure went up: Thursday BP 176/89, Wed. 182/69 Tues.182/77, Mon 199/71 looks like isosorbide is helping bring down BP a little bit.

## 2011-11-22 NOTE — Telephone Encounter (Signed)
We can go to a double dose TID

## 2011-11-25 ENCOUNTER — Encounter: Payer: Self-pay | Admitting: Vascular Surgery

## 2011-11-25 NOTE — Telephone Encounter (Signed)
See phone note 11/23.

## 2011-11-25 NOTE — Telephone Encounter (Signed)
Spoke to pt's wife this AM, BP today 155/59. Much improved from 180s-190s. Advised pt continue to monitor, but if incr again and consistently high to incr to Isosorbide Dinitrate 2 tab TID (currently 1 tab TID). Pt will do this and will call if has to incr dose. Pt sees Dr. Arbie Cookey tomorrow for renal artery stenosis surgery consult. Will fax records.

## 2011-11-26 ENCOUNTER — Ambulatory Visit (INDEPENDENT_AMBULATORY_CARE_PROVIDER_SITE_OTHER): Payer: Medicare Other | Admitting: Vascular Surgery

## 2011-11-26 ENCOUNTER — Encounter: Payer: Self-pay | Admitting: Vascular Surgery

## 2011-11-26 VITALS — BP 171/70 | HR 77 | Resp 16 | Ht 69.0 in | Wt 176.0 lb

## 2011-11-26 DIAGNOSIS — N186 End stage renal disease: Secondary | ICD-10-CM

## 2011-11-26 NOTE — Progress Notes (Signed)
The patient presents today for followup of his diffuse peripheral vascular occlusive disease. He had multiple attempts at right femoral popliteal revascularization by former partner. He is also status post right carotid endarterectomy in 1993. He didn't his usual stable health and has had recently difficult to control blood pressure. He underwent invasive vascular at [facility] in [place]. I have the studies were reviewed and discussed them with the patient. Is reveals moderate recurrent stenosis following that 20 years after his right carotid endarterectomy estimated at 4059% he has known chronic occlusion of his left internal carotid artery he has stable ankle arm indices in the 0.5 range bilaterally. His renal duplex suggested greater than 60% bilateral renal artery stenosis. He is seen today for further discussion and possible intervention.  Past Medical History  Diagnosis Date  . PVD (peripheral vascular disease)     s/p multiple revascularization procedures.  . Carotid artery occlusion     s/p right carotid endarterectomy  . Coronary artery disease     s/p coronary bypass graft surgery in 1992, stable  . Hyperlipidemia   . Hypertension     History  Substance Use Topics  . Smoking status: Former Smoker    Types: Cigarettes  . Smokeless tobacco: Not on file  . Alcohol Use: Yes    Family History  Problem Relation Age of Onset  . Heart disease Other     Allergies  Allergen Reactions  . Adhesive (Tape)     SKIN SENSITIVITY  . Isosorbide Mononitrate Rash    Current outpatient prescriptions:aspirin 81 MG tablet, Take 81 mg by mouth daily.  , Disp: , Rfl: ;  co-enzyme Q-10 30 MG capsule, Take 30 mg by mouth daily.  , Disp: , Rfl: ;  ezetimibe (ZETIA) 10 MG tablet, Take 1 tablet (10 mg total) by mouth daily., Disp: 90 tablet, Rfl: 3;  fish oil-omega-3 fatty acids 1000 MG capsule, Take 1 g by mouth 3 (three) times daily.  , Disp: , Rfl:  Garlic 1000 MG CAPS, Take 7 capsules by mouth  daily.  , Disp: , Rfl: ;  hydrALAZINE (APRESOLINE) 50 MG tablet, Take 1 and 1/2 tablet four times a day., Disp: 120 tablet, Rfl: 11;  hydrochlorothiazide 25 MG tablet, Take 0.5 tablets (12.5 mg total) by mouth daily., Disp: 30 tablet, Rfl: 6;  isosorbide dinitrate (ISOCHRON) 40 MG CR tablet, Take 1 tablet (40 mg total) by mouth 3 (three) times daily., Disp: 90 tablet, Rfl: 6 nitroGLYCERIN (NITROLINGUAL) 0.4 MG/SPRAY spray, Place 1 spray under the tongue every 5 (five) minutes as needed.  , Disp: , Rfl: ;  nortriptyline (PAMELOR) 25 MG capsule, Take 25 mg by mouth at bedtime. For sleeping , Disp: , Rfl: ;  polyethylene glycol (MIRALAX / GLYCOLAX) packet, Take 17 g by mouth daily.  , Disp: , Rfl: ;  Ranitidine HCl (RANITIDINE 75 PO), Take 1 tablet by mouth daily.  , Disp: , Rfl:  simvastatin (ZOCOR) 40 MG tablet, Take 1 tablet (40 mg total) by mouth every evening., Disp: 90 tablet, Rfl: 3;  verapamil (COVERA HS) 240 MG (CO) 24 hr tablet, Take 240 mg by mouth daily.  , Disp: , Rfl: ;  Zn-Pyg Afri-Nettle-Saw Palmet (SAW PALMETTO COMPLEX PO), Take 2 tablets by mouth daily.  , Disp: , Rfl:   BP 171/70  Pulse 77  Resp 16  Ht 5\' 9"  (1.753 m)  Wt 176 lb (79.833 kg)  BMI 25.99 kg/m2  SpO2 95%  Body mass index is 25.99 kg/(m^2).  Review of systems negative other than history of present illness and bilateral lower extremity claudication.  Physical exam: Although well-nourished white male appearing stated age in no acute distress. HEENT normal. Well-healed right carotid incision with no bruits bilaterally. Heart regular rate and rhythm without murmur chest clear bilaterally. 2+ radial pulses. 2+ right femoral pulse and absent left femoral pulse. Absent popliteal and distal pulses bilaterally. Her lower trim the tissue loss. Musculoskeletal no major deformities or cyanosis. Neurologic grossly intact. Abdominal exam without tenderness in no bruits noted.  Impression and plan: Diffuse peripheral  vascular occlusive disease. He does report claudication bilaterally left greater than right. I explained that with his parents left iliac occlusive disease at this may be amenable to percutaneous treatment with angioplasty and stenting. Of more concern is his recent difficult to control hypertension and duplex suggesting severe renal stenosis. I have recommended arteriography further evaluation and have explained that we would intervene with treatment of his left iliac and or renal stenosis if this was satisfactory for percutaneous treatment. Diagnostic arteriogram and potential treatment has been scheduled on December 4 with Dr. Myra Gianotti.

## 2011-11-27 ENCOUNTER — Other Ambulatory Visit: Payer: Self-pay

## 2011-11-27 ENCOUNTER — Encounter (HOSPITAL_COMMUNITY): Payer: Self-pay | Admitting: Pharmacy Technician

## 2011-11-28 ENCOUNTER — Institutional Professional Consult (permissible substitution): Payer: Medicare Other | Admitting: Cardiovascular Disease

## 2011-12-03 ENCOUNTER — Encounter (HOSPITAL_COMMUNITY): Payer: Self-pay | Admitting: General Practice

## 2011-12-03 ENCOUNTER — Encounter: Payer: Self-pay | Admitting: Cardiovascular Disease

## 2011-12-03 ENCOUNTER — Ambulatory Visit (HOSPITAL_COMMUNITY)
Admission: RE | Admit: 2011-12-03 | Discharge: 2011-12-04 | Disposition: A | Discharge status: Home / Self Care | Payer: Medicare Other | Source: Ambulatory Visit | Attending: Surgery | Admitting: Surgery

## 2011-12-03 ENCOUNTER — Encounter (HOSPITAL_COMMUNITY)
Admission: RE | Disposition: A | Discharge status: Home / Self Care | Payer: Self-pay | Source: Ambulatory Visit | Attending: Surgery

## 2011-12-03 DIAGNOSIS — R413 Other amnesia: Secondary | ICD-10-CM | POA: Insufficient documentation

## 2011-12-03 DIAGNOSIS — I10 Essential (primary) hypertension: Secondary | ICD-10-CM | POA: Insufficient documentation

## 2011-12-03 DIAGNOSIS — I70219 Atherosclerosis of native arteries of extremities with intermittent claudication, unspecified extremity: Secondary | ICD-10-CM

## 2011-12-03 DIAGNOSIS — I701 Atherosclerosis of renal artery: Secondary | ICD-10-CM | POA: Insufficient documentation

## 2011-12-03 DIAGNOSIS — I739 Peripheral vascular disease, unspecified: Secondary | ICD-10-CM

## 2011-12-03 DIAGNOSIS — I708 Atherosclerosis of other arteries: Secondary | ICD-10-CM | POA: Insufficient documentation

## 2011-12-03 DIAGNOSIS — I6529 Occlusion and stenosis of unspecified carotid artery: Secondary | ICD-10-CM | POA: Insufficient documentation

## 2011-12-03 DIAGNOSIS — E785 Hyperlipidemia, unspecified: Secondary | ICD-10-CM | POA: Insufficient documentation

## 2011-12-03 DIAGNOSIS — I2510 Atherosclerotic heart disease of native coronary artery without angina pectoris: Secondary | ICD-10-CM | POA: Insufficient documentation

## 2011-12-03 HISTORY — DX: Gastro-esophageal reflux disease without esophagitis: K21.9

## 2011-12-03 HISTORY — PX: ABDOMINAL AORTAGRAM: SHX5454

## 2011-12-03 HISTORY — DX: Chronic kidney disease, unspecified: N18.9

## 2011-12-03 HISTORY — PX: RENAL ANGIOGRAM: SHX5509

## 2011-12-03 HISTORY — DX: Shortness of breath: R06.02

## 2011-12-03 HISTORY — PX: OTHER SURGICAL HISTORY: SHX169

## 2011-12-03 HISTORY — DX: Acute myocardial infarction, unspecified: I21.9

## 2011-12-03 HISTORY — DX: Malignant (primary) neoplasm, unspecified: C80.1

## 2011-12-03 SURGERY — ANGIOGRAM EXTREMITY BILATERAL

## 2011-12-03 MED ORDER — HYDROCHLOROTHIAZIDE 25 MG PO TABS
12.5000 mg | ORAL_TABLET | Freq: Every day | ORAL | Status: DC
  Filled 2011-12-03: qty 0.5

## 2011-12-03 MED ORDER — HYDROCHLOROTHIAZIDE 12.5 MG PO CAPS
12.5000 mg | ORAL_CAPSULE | Freq: Every day | ORAL | Status: DC
  Administered 2011-12-03: 12.5 mg via ORAL
  Filled 2011-12-03 (×2): qty 1

## 2011-12-03 MED ORDER — ISOSORBIDE DINITRATE 20 MG PO TABS
40.0000 mg | ORAL_TABLET | Freq: Three times a day (TID) | ORAL | Status: DC
  Administered 2011-12-03: 40 mg via ORAL
  Filled 2011-12-03 (×4): qty 2

## 2011-12-03 MED ORDER — VERAPAMIL HCL 240 MG (CO) PO TB24: 240.0000 mg | ORAL_TABLET | Freq: Every day | ORAL | Status: DC

## 2011-12-03 MED ORDER — FAMOTIDINE 20 MG PO TABS
20.0000 mg | ORAL_TABLET | Freq: Two times a day (BID) | ORAL | Status: DC
  Administered 2011-12-03: 20 mg via ORAL
  Filled 2011-12-03 (×3): qty 1

## 2011-12-03 MED ORDER — SODIUM CHLORIDE 0.9 % IV SOLN
INTRAVENOUS | Status: DC
  Administered 2011-12-03 (×2): via INTRAVENOUS

## 2011-12-03 MED ORDER — MIDAZOLAM HCL 2 MG/2ML IJ SOLN
INTRAMUSCULAR | Status: AC
  Filled 2011-12-03: qty 2

## 2011-12-03 MED ORDER — HEPARIN SODIUM (PORCINE) 1000 UNIT/ML IJ SOLN
INTRAMUSCULAR | Status: AC
  Filled 2011-12-03: qty 2

## 2011-12-03 MED ORDER — GARLIC 1000 MG PO CAPS: 7.0000 | ORAL_CAPSULE | Freq: Every day | ORAL | Status: DC

## 2011-12-03 MED ORDER — HEPARIN (PORCINE) IN NACL 2-0.9 UNIT/ML-% IJ SOLN
INTRAMUSCULAR | Status: AC
  Filled 2011-12-03: qty 1000

## 2011-12-03 MED ORDER — THERA M PLUS PO TABS
1.0000 | ORAL_TABLET | Freq: Every day | ORAL | Status: DC
  Filled 2011-12-03: qty 1

## 2011-12-03 MED ORDER — OMEGA-3-ACID ETHYL ESTERS 1 G PO CAPS
3.0000 g | ORAL_CAPSULE | Freq: Every day | ORAL | Status: DC
  Administered 2011-12-03: 3 g via ORAL
  Filled 2011-12-03 (×2): qty 3

## 2011-12-03 MED ORDER — HEPARIN SODIUM (PORCINE) 1000 UNIT/ML IJ SOLN
INTRAMUSCULAR | Status: AC
  Filled 2011-12-03: qty 1

## 2011-12-03 MED ORDER — CLOPIDOGREL BISULFATE 75 MG PO TABS
75.0000 mg | ORAL_TABLET | Freq: Every day | ORAL | Status: DC
  Administered 2011-12-04: 75 mg via ORAL
  Filled 2011-12-03: qty 4
  Filled 2011-12-03: qty 1

## 2011-12-03 MED ORDER — FLAXSEED (LINSEED) 1000 MG PO CAPS: 1.0000 | ORAL_CAPSULE | Freq: Every day | ORAL | Status: DC

## 2011-12-03 MED ORDER — OMEGA-3 FATTY ACIDS 1000 MG PO CAPS
3.0000 g | ORAL_CAPSULE | Freq: Two times a day (BID) | ORAL | Status: DC
Start: 2011-12-03 — End: 2011-12-03

## 2011-12-03 MED ORDER — OXYCODONE-ACETAMINOPHEN 5-325 MG PO TABS
ORAL_TABLET | ORAL | Status: AC
  Filled 2011-12-03: qty 2

## 2011-12-03 MED ORDER — ZOLPIDEM TARTRATE 5 MG PO TABS: 5.0000 mg | ORAL_TABLET | Freq: Every evening | ORAL | Status: DC | PRN

## 2011-12-03 MED ORDER — LIDOCAINE HCL (PF) 1 % IJ SOLN
INTRAMUSCULAR | Status: AC
  Filled 2011-12-03: qty 30

## 2011-12-03 MED ORDER — ROSUVASTATIN CALCIUM 10 MG PO TABS
10.0000 mg | ORAL_TABLET | Freq: Every day | ORAL | Status: DC
  Administered 2011-12-03: 10 mg via ORAL
  Filled 2011-12-03 (×2): qty 1

## 2011-12-03 MED ORDER — NITROGLYCERIN 0.4 MG/SPRAY TL SOLN
1.0000 | Status: DC | PRN
  Filled 2011-12-03: qty 4.9

## 2011-12-03 MED ORDER — OXYCODONE-ACETAMINOPHEN 5-325 MG PO TABS
1.0000 | ORAL_TABLET | ORAL | Status: DC | PRN
  Administered 2011-12-03: 2 via ORAL

## 2011-12-03 MED ORDER — NORTRIPTYLINE HCL 25 MG PO CAPS
75.0000 mg | ORAL_CAPSULE | Freq: Every day | ORAL | Status: DC
  Administered 2011-12-03: 75 mg via ORAL
  Filled 2011-12-03 (×2): qty 3

## 2011-12-03 MED ORDER — ONDANSETRON HCL 4 MG/2ML IJ SOLN: 4.0000 mg | Freq: Four times a day (QID) | INTRAMUSCULAR | Status: DC | PRN

## 2011-12-03 MED ORDER — SIMVASTATIN 40 MG PO TABS: 40.0000 mg | ORAL_TABLET | Freq: Every evening | ORAL | Status: DC

## 2011-12-03 MED ORDER — FENTANYL CITRATE 0.05 MG/ML IJ SOLN
INTRAMUSCULAR | Status: AC
  Filled 2011-12-03: qty 2

## 2011-12-03 MED ORDER — SODIUM CHLORIDE 0.9 % IJ SOLN: 3.0000 mL | INTRAMUSCULAR | Status: DC | PRN

## 2011-12-03 MED ORDER — COENZYME Q10 30 MG PO CAPS: 100.0000 mg | ORAL_CAPSULE | Freq: Every day | ORAL | Status: DC

## 2011-12-03 MED ORDER — VERAPAMIL HCL ER 240 MG PO TBCR
240.0000 mg | EXTENDED_RELEASE_TABLET | Freq: Every day | ORAL | Status: DC
  Administered 2011-12-03: 240 mg via ORAL
  Filled 2011-12-03 (×2): qty 1

## 2011-12-03 MED ORDER — ASPIRIN 81 MG PO TABS: 162.0000 mg | ORAL_TABLET | Freq: Every day | ORAL | Status: DC

## 2011-12-03 MED ORDER — ASPIRIN EC 81 MG PO TBEC
162.0000 mg | DELAYED_RELEASE_TABLET | Freq: Every day | ORAL | Status: DC
  Administered 2011-12-03: 162 mg via ORAL
  Filled 2011-12-03 (×2): qty 2

## 2011-12-03 MED ORDER — PNEUMOCOCCAL VAC POLYVALENT 25 MCG/0.5ML IJ INJ
0.5000 mL | INJECTION | INTRAMUSCULAR | Status: DC
  Filled 2011-12-03: qty 0.5

## 2011-12-03 MED ORDER — SODIUM CHLORIDE 0.9 % IV SOLN
INTRAVENOUS | Status: DC
  Administered 2011-12-03: 09:00:00 via INTRAVENOUS

## 2011-12-03 MED ORDER — ISOSORBIDE DINITRATE ER 40 MG PO TBCR
40.0000 mg | EXTENDED_RELEASE_TABLET | Freq: Three times a day (TID) | ORAL | Status: DC
Start: 2011-12-03 — End: 2011-12-03
  Filled 2011-12-03 (×3): qty 1

## 2011-12-03 MED ORDER — HYDRALAZINE HCL 50 MG PO TABS
100.0000 mg | ORAL_TABLET | Freq: Three times a day (TID) | ORAL | Status: DC
  Administered 2011-12-03: 100 mg via ORAL
  Filled 2011-12-03 (×4): qty 2

## 2011-12-03 MED ORDER — ACETAMINOPHEN 325 MG PO TABS: 650.0000 mg | ORAL_TABLET | ORAL | Status: DC | PRN

## 2011-12-03 MED ORDER — CLOPIDOGREL BISULFATE 75 MG PO TABS
300.0000 mg | ORAL_TABLET | Freq: Once | ORAL | Status: AC
  Administered 2011-12-03: 300 mg via ORAL

## 2011-12-03 MED ORDER — POLYETHYLENE GLYCOL 3350 17 G PO PACK
17.0000 g | PACK | Freq: Every day | ORAL | Status: DC
  Administered 2011-12-03: 17 g via ORAL
  Filled 2011-12-03 (×2): qty 1

## 2011-12-03 MED ORDER — EZETIMIBE 10 MG PO TABS
10.0000 mg | ORAL_TABLET | Freq: Every day | ORAL | Status: DC
  Administered 2011-12-03: 10 mg via ORAL
  Filled 2011-12-03 (×2): qty 1

## 2011-12-03 NOTE — Op Note (Signed)
[facility] [place]  Patient name: Clifford Aguilar MRN: [id number] DOB: 28-Aug-1941 Sex: male  12/03/2011 Pre-operative Diagnosis: Renal stenosis, claudication Post-operative diagnosis:  Same Surgeon:  Jorge Ny Procedure Performed:  1.  bilateral ultrasound guided common femoral artery access  2.  abdominal aortogram  3.  bilateral lower sternum a runoff  4.  stent left renal artery (express 6 x 18)  5.  stent left common and external iliac artery (9 x 80 Epic)    Indications:  Patient comes in today for further evaluation of his claudication and renal artery stenosis. He has an outside ultrasound that shows bilateral greater than 60% renal artery stenosis. He has recently had significant problems with his blood pressure control. In addition he is having claudication. His left leg bothers him more so than the right. He has had multiple operations in the right groin.  Procedure:  The patient was identified in the holding area and taken to room 8.  The patient was then placed supine on the table and prepped and draped in the usual sterile fashion.  A time out was called.  Ultrasound was used to evaluate the left common femoral artery.  It was patent .  A digital ultrasound image was acquired.  A micropuncture needle was used to access the left common femoral artery under ultrasound guidance.  An 018 wire was advanced without resistance and a micropuncture sheath was placed.  The 018 wire was removed and a benson wire was placed.  The micropuncture sheath was exchanged for a 5 french sheath. I did try to get wire access into the aorta however I could not advance the wire through the iliac system. Contrast injections were performed which showed a high-grade left iliac stenosis. I entered a dissection plane and could not redirect the wire into the aorta. I therefore elected to proceed with right-sided access. The right common femoral artery was accessed under ultrasound  guidance with an 18-gauge needle. A Benson wire was then advanced into the aorta followed by a 5 [place] sheath. An omni-flush catheter was advanced to the level of L1 and an abdominal aortogram was performed based on these images left renal intervention was planned. I also obtained bilateral lower sternum a runoff after the renal stent placement. This was done with the catheter at the aortic bifurcation. Findings:   Aortogram:  No significant suprarenal disease was visualized. Superior mesenteric artery was found to be widely patent. There are multiple renal arteries bilaterally. There was approximately a 50-60% right renal artery origin stenosis. There was a high-grade, approximately 80% left renal artery stenosis. The infrarenal abdominal aorta was heavily calcified with essentially plaque. There were no hemodynamically significant lesions identified. There is a high-grade left distal common iliac stenosis which extends into the left external iliac artery. There is severe calcification within the left external iliac artery. The right common iliac artery is patent. There is luminal narrowing which is not hemodynamically significant in the mid to distal common iliac artery. The external iliac artery on the right is patent.  Right Lower Extremity:  There is a patulous dilatation of the right common femoral artery likely from prior surgery. At the origin of this area at the level of the femoral head there is a high-grade stenosis. The right superficial femoral artery is occluded. The profunda femoral artery is robust and patent. There is reconstitution at the distal most portion of the below knee popliteal artery with three-vessel runoff.  Left Lower Extremity:  The left common femoral artery has a very narrow luminal flow channel. This is throughout it's entire course. The superficial femoral artery is occluded at its origin the profunda femoris is robust. There is reconstitution of the popliteal artery at the  level of the patella. There is two-vessel runoff via the posterior tibial and peroneal artery.  Intervention:  Because of the patient's hypertension and renal artery stenosis on the left I elected to proceed with intervention. Through the right groin I upsized to a 6 French sheath the patient was then fully heparinized. His heparin had to be re-dosed because his ACT was consistently subtherapeutic. Using a 014 spartacore wire and a IM guide cath the left renal artery was selected. I elected to primary stent this area. A [employer] express 6 x 18 balloon expandable stent was deployed across the stenosis with the proximal portion out into the aorta approximately 1-2 mm. The balloon was taken to nominal pressure for followup arteriogram revealed resolution of the stenosis within the proximal left renal artery.  Attention was then turned towards the left iliac stenosis. I tried to cross the aortic bifurcation and treat this from the right side. Using the Omni flush catheter and a Benson wire I was able to get the wire to the level of the stenosis but no further. I then switched out the Omni flush catheter for a quick cross catheter and I then used a Glidewire. I was able to get the Glidewire across the stenosis into the distal left external iliac artery. However, I could not advance the quick cross catheter beyond the stenosis.  Already had left groin access I selected a 10 mm snare. I then was able to snare the Glidewire and pushed the catheter from the left groin into the aorta. A contrast injection was performed to confirm that I was within the true lumen. I then placed a Rosen wire into the aorta and the catheter was removed from the left groin. I predilated the stenosis with a 4 mm balloon and then primarily stented the left common and external iliac artery. This was done with a [employer] 9 x 81 self-expanding stent. This was molded to confirmation with a 8 mm balloon. Completion studies  revealed resolution of the iliac stenosis on the left. At this point the decision was made to terminate the procedure. The catheters and wires were removed. The patient taken the holding area for sheath pull  Impression:  #1  high-grade left renal artery stenosis successfully treated with a 6 x 18 balloon expandable stent  #2  50-60% right renal artery stenosis  #3  high-grade left common and external iliac stenosis successfully treated with a 9 x 81 self-expanding stent  #4  bilateral superficial femoral occlusion  #5  proximal right common femoral artery stenosis at the level of the prior intervention   V. Durene Cal, M.D. [facility] [place] Office: [phone] Pager:  [phone]

## 2011-12-03 NOTE — H&P (View-Only) (Signed)
The patient presents today for followup of his diffuse peripheral vascular occlusive disease. He had multiple attempts at right femoral popliteal revascularization by former partner. He is also status post right carotid endarterectomy in 1993. He didn't his usual stable health and has had recently difficult to control blood pressure. He underwent invasive vascular at [facility] in [place]. I have the studies were reviewed and discussed them with the patient. Is reveals moderate recurrent stenosis following that 20 years after his right carotid endarterectomy estimated at 4059% he has known chronic occlusion of his left internal carotid artery he has stable ankle arm indices in the 0.5 range bilaterally. His renal duplex suggested greater than 60% bilateral renal artery stenosis. He is seen today for further discussion and possible intervention.  Past Medical History  Diagnosis Date  . PVD (peripheral vascular disease)     s/p multiple revascularization procedures.  . Carotid artery occlusion     s/p right carotid endarterectomy  . Coronary artery disease     s/p coronary bypass graft surgery in 1992, stable  . Hyperlipidemia   . Hypertension     History  Substance Use Topics  . Smoking status: Former Smoker    Types: Cigarettes  . Smokeless tobacco: Not on file  . Alcohol Use: Yes    Family History  Problem Relation Age of Onset  . Heart disease Other     Allergies  Allergen Reactions  . Adhesive (Tape)     SKIN SENSITIVITY  . Isosorbide Mononitrate Rash    Current outpatient prescriptions:aspirin 81 MG tablet, Take 81 mg by mouth daily.  , Disp: , Rfl: ;  co-enzyme Q-10 30 MG capsule, Take 30 mg by mouth daily.  , Disp: , Rfl: ;  ezetimibe (ZETIA) 10 MG tablet, Take 1 tablet (10 mg total) by mouth daily., Disp: 90 tablet, Rfl: 3;  fish oil-omega-3 fatty acids 1000 MG capsule, Take 1 g by mouth 3 (three) times daily.  , Disp: , Rfl:  Garlic 1000 MG CAPS, Take 7 capsules by mouth  daily.  , Disp: , Rfl: ;  hydrALAZINE (APRESOLINE) 50 MG tablet, Take 1 and 1/2 tablet four times a day., Disp: 120 tablet, Rfl: 11;  hydrochlorothiazide 25 MG tablet, Take 0.5 tablets (12.5 mg total) by mouth daily., Disp: 30 tablet, Rfl: 6;  isosorbide dinitrate (ISOCHRON) 40 MG CR tablet, Take 1 tablet (40 mg total) by mouth 3 (three) times daily., Disp: 90 tablet, Rfl: 6 nitroGLYCERIN (NITROLINGUAL) 0.4 MG/SPRAY spray, Place 1 spray under the tongue every 5 (five) minutes as needed.  , Disp: , Rfl: ;  nortriptyline (PAMELOR) 25 MG capsule, Take 25 mg by mouth at bedtime. For sleeping , Disp: , Rfl: ;  polyethylene glycol (MIRALAX / GLYCOLAX) packet, Take 17 g by mouth daily.  , Disp: , Rfl: ;  Ranitidine HCl (RANITIDINE 75 PO), Take 1 tablet by mouth daily.  , Disp: , Rfl:  simvastatin (ZOCOR) 40 MG tablet, Take 1 tablet (40 mg total) by mouth every evening., Disp: 90 tablet, Rfl: 3;  verapamil (COVERA HS) 240 MG (CO) 24 hr tablet, Take 240 mg by mouth daily.  , Disp: , Rfl: ;  Zn-Pyg Afri-Nettle-Saw Palmet (SAW PALMETTO COMPLEX PO), Take 2 tablets by mouth daily.  , Disp: , Rfl:   BP 171/70  Pulse 77  Resp 16  Ht 5' 9" (1.753 m)  Wt 176 lb (79.833 kg)  BMI 25.99 kg/m2  SpO2 95%  Body mass index is 25.99 kg/(m^2).          Review of systems negative other than history of present illness and bilateral lower extremity claudication.  Physical exam: Although well-nourished white male appearing stated age in no acute distress. HEENT normal. Well-healed right carotid incision with no bruits bilaterally. Heart regular rate and rhythm without murmur chest clear bilaterally. 2+ radial pulses. 2+ right femoral pulse and absent left femoral pulse. Absent popliteal and distal pulses bilaterally. Her lower trim the tissue loss. Musculoskeletal no major deformities or cyanosis. Neurologic grossly intact. Abdominal exam without tenderness in no bruits noted.  Impression and plan: Diffuse peripheral  vascular occlusive disease. He does report claudication bilaterally left greater than right. I explained that with his parents left iliac occlusive disease at this may be amenable to percutaneous treatment with angioplasty and stenting. Of more concern is his recent difficult to control hypertension and duplex suggesting severe renal stenosis. I have recommended arteriography further evaluation and have explained that we would intervene with treatment of his left iliac and or renal stenosis if this was satisfactory for percutaneous treatment. Diagnostic arteriogram and potential treatment has been scheduled on December 4 with Dr. Brabham. 

## 2011-12-03 NOTE — Progress Notes (Signed)
Multiple medication warnings reviewed with Dr. Myra Gianotti; all released.

## 2011-12-03 NOTE — Interval H&P Note (Signed)
History and Physical Interval Note:  12/03/2011 10:03 AM  Clifford Aguilar  has presented today for surgery, with the diagnosis of ras  The various methods of treatment have been discussed with the patient and family. After consideration of risks, benefits and other options for treatment, the patient has consented to  Procedure(s): ABDOMINAL AORTAGRAM as a surgical intervention .  The patients' history has been reviewed, patient examined, no change in status, stable for surgery.  I have reviewed the patients' chart and labs.  Questions were answered to the patient's satisfaction.     BRABHAM IV, V. WELLS

## 2011-12-04 ENCOUNTER — Other Ambulatory Visit: Payer: Medicare Other | Admitting: *Deleted

## 2011-12-04 ENCOUNTER — Telehealth: Payer: Self-pay | Admitting: *Deleted

## 2011-12-04 LAB — POCT I-STAT, CHEM 8
BUN: 18 mg/dL (ref 6–23)
Calcium, Ion: 1.22 mmol/L (ref 1.12–1.32)
Chloride: 103 mEq/L (ref 96–112)
Creatinine, Ser: 1 mg/dL (ref 0.50–1.35)
Glucose, Bld: 102 mg/dL — ABNORMAL HIGH (ref 70–99)
TCO2: 27 mmol/L (ref 0–100)

## 2011-12-04 LAB — CBC
HCT: 35.1 % — ABNORMAL LOW (ref 39.0–52.0)
Hemoglobin: 11.9 g/dL — ABNORMAL LOW (ref 13.0–17.0)
MCH: 30.7 pg (ref 26.0–34.0)
MCHC: 33.9 g/dL (ref 30.0–36.0)
MCV: 90.5 fL (ref 78.0–100.0)
RBC: 3.88 MIL/uL — ABNORMAL LOW (ref 4.22–5.81)

## 2011-12-04 LAB — BASIC METABOLIC PANEL
BUN: 16 mg/dL (ref 6–23)
CO2: 26 mEq/L (ref 19–32)
Chloride: 106 mEq/L (ref 96–112)
Glucose, Bld: 106 mg/dL — ABNORMAL HIGH (ref 70–99)
Potassium: 3.6 mEq/L (ref 3.5–5.1)
Sodium: 139 mEq/L (ref 135–145)

## 2011-12-04 LAB — POCT ACTIVATED CLOTTING TIME: Activated Clotting Time: 122 seconds

## 2011-12-04 MED ORDER — CLOPIDOGREL BISULFATE 75 MG PO TABS: 75.0000 mg | ORAL_TABLET | Freq: Every day | ORAL | Status: AC

## 2011-12-04 NOTE — Discharge Summary (Signed)
[facility] Discharge Summary  Clifford Aguilar [birth year] 70 y.o. male  [id number]  Admission Date: 12/03/2011  Discharge Date: 12/04/11  Physician: Juleen China, MD  Admission Diagnosis: ras   HPI:   This is a 70 y.o. male presents today for followup of his diffuse peripheral vascular occlusive disease. He had multiple attempts at right femoral popliteal revascularization by former partner. He is also status post right carotid endarterectomy in 1993. He didn't his usual stable health and has had recently difficult to control blood pressure. He underwent invasive vascular at [facility] in [place]. I have the studies were reviewed and discussed them with the patient. Is reveals moderate recurrent stenosis following that 20 years after his right carotid endarterectomy estimated at 4059% he has known chronic occlusion of his left internal carotid artery he has stable ankle arm indices in the 0.5 range bilaterally. His renal duplex suggested greater than 60% bilateral renal artery stenosis. He is seen today for further discussion and possible intervention.    Hospital Course:  The patient was admitted to the hospital and taken to the [facility] on 12/03/2011 and underwent:  1. bilateral ultrasound guided common femoral artery access  2. abdominal aortogram  3. bilateral lower sternum a runoff  4. stent left renal artery (express 6 x 18)  5. stent left common and external iliac artery (9 x 80 Epic)  .  The pt tolerated the procedure well and was transported to the PACU in good condition.  He has PT signal bilaterally.  The pt is placed on Plavix/ASA and will need to continue indefinitely. The remainder of the hospital course consisted of increasing ambulation and increasing intake of solids without difficulty.    Basename 12/04/11 0600  NA 139  K 3.6  CL 106  CO2 26  GLUCOSE 106*  BUN 16  CALCIUM 8.6    Basename 12/04/11 0600  WBC 7.1  HGB 11.9*  HCT 35.1*    PLT 150   No results found for this basename: INR:2 in the last 72 hours   Discharge Instructions:   The patient is discharged to home with extensive instructions on wound care and progressive ambulation.  They are instructed not to drive or perform any heavy lifting until returning to see the physician in his office.  [facility] Discharge Instructions   Increase activity slowly  Shower Wednesday No lifting for 4weeks   Medications:  See discharge medication list for renewal of home medication and new medication orders.  Wound Care:  May reove dressings in 48 hours; apply dry dressings as needed Shower daily with soap and water  Call our office if you experience any of the following:   -elevated temperature of more than 100.5 degrees F  -Persistent drainage or bleeding from incision site(s)  -Increased swelling or redness at incision sites or in affected limb(s)  -severe pain at incision site  -Severe or increased pain in affected limb(s)  -loss or decreased feeling in affected limb(s)               Carotid Surgery:  Difficulty swallowing or speaking; weakness in arms or legs                                             that is new.  Abdominal procedure/aneurysm repair/aorto-bifemoral bypass:                  Increase abdominal pain, cramping, diarrhea, nausea or vomitting  Follow up Appointments:  Phone number [phone]  Return to see Dr. Arbie Cookey in 4 weeks with renal US and ABIs. Our office will contact you with your appointment date and time.    Discharge Diagnosis:  ras  Secondary Diagnosis: Patient Active Problem List  Diagnoses  . Other and unspecified hyperlipidemia  . CAD  . CAD, AUTOLOGOUS BYPASS GRAFT  . CAROTID ARTERY DISEASE  . PVD  . MEMORY LOSS  . HTN (hypertension)   Past Medical History  Diagnosis Date  . PVD (peripheral vascular disease)     s/p multiple revascularization procedures.  . Carotid artery  occlusion     s/p right carotid endarterectomy  . Coronary artery disease     s/p coronary bypass graft surgery in 1992, stable  . Hyperlipidemia   . Hypertension   . Myocardial infarction   . Shortness of breath   . Chronic kidney disease     renal artery stenosis  . Cancer     basel cell carcinoma left ear  . GERD (gastroesophageal reflux disease)        Clifford Aguilar, Clifford Aguilar  Home Medication Instructions [id number]   Printed on:12/04/11 [phone]  Medication Information                    aspirin 81 MG tablet Take 162 mg by mouth daily.            co-enzyme Q-10 30 MG capsule Take 100 mg by mouth daily.            fish oil-omega-3 fatty acids 1000 MG capsule Take 3 g by mouth 2 (two) times daily.            Garlic 1000 MG CAPS Take 7 capsules by mouth daily.             polyethylene glycol (MIRALAX / GLYCOLAX) packet Take 17 g by mouth daily.             nitroGLYCERIN (NITROLINGUAL) 0.4 MG/SPRAY spray Place 1 spray under the tongue every 5 (five) minutes as needed. For chest pain           nortriptyline (PAMELOR) 25 MG capsule Take 75 mg by mouth at bedtime. For sleeping           Ranitidine HCl (RANITIDINE 75 PO) Take 1 tablet by mouth daily.             Zn-Pyg Afri-Nettle-Saw Palmet (SAW PALMETTO COMPLEX PO) Take 2 tablets by mouth daily.            verapamil (COVERA HS) 240 MG (CO) 24 hr tablet Take 240 mg by mouth daily.             simvastatin (ZOCOR) 40 MG tablet Take 1 tablet (40 mg total) by mouth every evening.           ezetimibe (ZETIA) 10 MG tablet Take 1 tablet (10 mg total) by mouth daily.           hydrochlorothiazide 25 MG tablet Take 0.5 tablets (12.5 mg total) by mouth daily.           hydrALAZINE (APRESOLINE) 50 MG tablet Take 100 mg by mouth 3 (three) times daily.            isosorbide dinitrate (ISOCHRON) 40 MG CR  tablet Take 1 tablet (40 mg total) by mouth 3 (three) times daily.           Multiple Vitamins-Minerals (MULTIVITAMINS  THER. W/MINERALS) TABS Take 1 tablet by mouth daily.             Flaxseed, Linseed, 1000 MG CAPS Take 1 capsule by mouth daily.             clopidogrel (PLAVIX) 75 MG tablet Take 1 tablet (75 mg total) by mouth daily with breakfast.             Disposition: home   Patient's condition: is Good   Clifford Pigg, PA-C [facility] [phone] 12/04/2011  8:38 AM

## 2011-12-04 NOTE — Progress Notes (Signed)
[facility] Progress Note  12/04/2011 8:06 AM POD 1  "I'm ready to go home"  Afebrile Filed Vitals:   12/04/11 0435  BP: 217/53  Pulse: 71  Temp: 98.1 F (36.7 C)  Resp:   BP at MN 128/39  Incisions:  bilateral groins without hematoma Extremities:  BLE warm without edema.  +signal PT bilaterally.  CBC    Component Value Date/Time   WBC 7.1 12/04/2011 0600   RBC 3.88* 12/04/2011 0600   HGB 11.9* 12/04/2011 0600   HCT 35.1* 12/04/2011 0600   PLT 150 12/04/2011 0600   MCV 90.5 12/04/2011 0600   MCH 30.7 12/04/2011 0600   MCHC 33.9 12/04/2011 0600   RDW 12.8 12/04/2011 0600    BMET    Component Value Date/Time   NA 139 12/04/2011 0600   K 3.6 12/04/2011 0600   CL 106 12/04/2011 0600   CO2 26 12/04/2011 0600   GLUCOSE 106* 12/04/2011 0600   BUN 16 12/04/2011 0600   CREATININE 0.84 12/04/2011 0600   CALCIUM 8.6 12/04/2011 0600   GFRNONAA 88* 12/04/2011 0600   GFRAA >90 12/04/2011 0600    Intake/Output Summary (Last 24 hours) at 12/04/11 0806 Last data filed at 12/04/11 0636  Gross per 24 hour  Intake 1588.33 ml  Output      0 ml  Net 1588.33 ml     Assessment/Plan:  70 y.o. male is POD 1 s/p: 1. bilateral ultrasound guided common femoral artery access  2. abdominal aortogram  3. bilateral lower sternum a runoff  4. stent left renal artery (express 6 x 18)  5. stent left common and external iliac artery (9 x 80 Epic)   -BUN/Cr normal (UOP not measured) -Hypertensive this am. (has not had meds) otherwise, BP measurements have been normal. -Good PT signals bilaterally.  -Home today.    Newton Pigg, PA-C [facility] [phone] 12/04/2011 8:06 AM

## 2011-12-04 NOTE — Discharge Summary (Signed)
Cr ok this am...will d/c home

## 2011-12-04 NOTE — Telephone Encounter (Signed)
Pt had left renal and left iliac stent placed, he will be discharged tomorrow. Wife states his BP today is 121/47! Much improved. I advised he monitor daily for another 1-2 weeks, dosage may be decr in future if numbers continue to come down, but advised continue same dose for now. He is going to f/u with Dr. Arbie Cookey in 1 month, when do you want to see him back here? Please advise.

## 2011-12-05 ENCOUNTER — Ambulatory Visit: Payer: Medicare Other | Admitting: Cardiovascular Disease

## 2011-12-05 NOTE — Telephone Encounter (Signed)
1 to 2 weeks, track bp during this time

## 2011-12-06 NOTE — Telephone Encounter (Signed)
Attempted to contact pt, LMOM TCB. I left msg of info below and asked them to call back to schedule appt.

## 2011-12-11 ENCOUNTER — Telehealth: Payer: Self-pay

## 2011-12-11 NOTE — Telephone Encounter (Signed)
Pt. alled office and reported episode, yesterday, of visual disturbance that lasted about 10 minutes.  Described as "discomfort in keeping eyes open for about 10 minutes due to brightness, even though it was cloudy outside".  Stated that he also "felt light-headed" at same time. Denies any visual problems today.  Pt. voiced concern that the episode could have been a reaction to Plavix.  Denied any change in level of alertness; denied weakness.  Stated he sat in his vehicle until it subsided.  Advised pt. to call to report this episode to his PCP.  Also, discussed w/ Dr. Edilia Bo, in office, about concern of reaction to Plavix.  Dr. Edilia Bo recommended pt. to continue Plavix, and to report sx's to PCP, and also to advise he may need to see an eye doctor.  Pt. notified of Dr. Adele Dan recommendation, and verbalized understanding. Stated he will call his PCP to report visual disturbance yesterday.

## 2011-12-12 ENCOUNTER — Telehealth: Payer: Self-pay | Admitting: *Deleted

## 2011-12-12 DIAGNOSIS — I6529 Occlusion and stenosis of unspecified carotid artery: Secondary | ICD-10-CM

## 2011-12-12 NOTE — Telephone Encounter (Signed)
Notified pt's wife per Dr. Mariah Milling that we will recheck carotid in 6 months, there is also a questionable interval occlusion of the vertebral artery.  With carotid stenosis he is at risk for TIAs, etc; advised pt go to ER if symptoms worsen. CT scan is option but not as accurate, will hold off for now. Pt is already on aggressive therapy, takes ASA 81 x 2 and Plavix 75, simva 40 and Zetia 10. Pt has f/u with Dr. Mariah Milling 12/21, and will call back with any changes. For BPs, advised he get more numbers during day time (since these are prior to taking AM meds), and he can bring to appt.

## 2011-12-12 NOTE — Telephone Encounter (Signed)
Pt's wife states pt had episode Tues this week of "feeling funny all over, and seeing bright lights for 15 min." Has not occurred since. Saw Dr. Arbie Cookey and he referred pt to PCP, Dr. Sullivan Lone and carotid bruit heard on right and advised he have another carotid u/s. Pt had u/s 09/2011 that showed RICA stenosis 40-59%, although shadowing could obscure a tighter stenosis. Pt was told that insurance would not cover another u/s since he just had. Pt is asking what Dr. Mariah Milling recommends regarding symptoms. Pt just had stent left common and external iliac artery early Dec 2012, he is also s/p right carotid endarterectomy in 1993. Will forward to Dr. Mariah Milling, advised pt call back sooner if symptoms return before hearing from office.   Recent BP results: 135/46, 164/63, 160/60, 168/66, 187/74, 159/51, 161/58, 147/79

## 2011-12-20 ENCOUNTER — Ambulatory Visit (INDEPENDENT_AMBULATORY_CARE_PROVIDER_SITE_OTHER): Payer: Medicare Other | Admitting: Cardiovascular Disease

## 2011-12-20 ENCOUNTER — Encounter: Payer: Self-pay | Admitting: Cardiovascular Disease

## 2011-12-20 DIAGNOSIS — I2510 Atherosclerotic heart disease of native coronary artery without angina pectoris: Secondary | ICD-10-CM

## 2011-12-20 DIAGNOSIS — I10 Essential (primary) hypertension: Secondary | ICD-10-CM

## 2011-12-20 DIAGNOSIS — I739 Peripheral vascular disease, unspecified: Secondary | ICD-10-CM

## 2011-12-20 DIAGNOSIS — E785 Hyperlipidemia, unspecified: Secondary | ICD-10-CM

## 2011-12-20 DIAGNOSIS — I25810 Atherosclerosis of coronary artery bypass graft(s) without angina pectoris: Secondary | ICD-10-CM

## 2011-12-20 DIAGNOSIS — I6529 Occlusion and stenosis of unspecified carotid artery: Secondary | ICD-10-CM

## 2011-12-20 MED ORDER — HYDRALAZINE HCL 100 MG PO TABS: 100.0000 mg | ORAL_TABLET | Freq: Four times a day (QID) | ORAL | Status: DC

## 2011-12-20 MED ORDER — LISINOPRIL 20 MG PO TABS: 20.0000 mg | ORAL_TABLET | Freq: Every day | ORAL | Status: DC

## 2011-12-20 NOTE — Patient Instructions (Signed)
You are doing well. Please start lisinopril 20 mg daily When you run out of hydralazine 50 mg pills, pick up the new prescription for 100 mg pills and take three times a day  Please call us if you have new issues that need to be addressed before your next appt.  Your physician wants you to follow-up in: 6 months.  You will receive a reminder letter in the mail two months in advance. If you don't receive a letter, please call our office to schedule the follow-up appointment.

## 2011-12-20 NOTE — Assessment & Plan Note (Signed)
Carotid as well as lower extremity arterial disease. We'll continue aggressive cholesterol management

## 2011-12-20 NOTE — Assessment & Plan Note (Signed)
Blood pressure continues to be elevated. We will start him on lisinopril 20 mg daily.

## 2011-12-20 NOTE — Assessment & Plan Note (Signed)
We have given him an order for him to have his cholesterol checked. Goal LDL less than 70. 

## 2011-12-20 NOTE — Progress Notes (Signed)
Patient ID: Clifford Aguilar, male    DOB: [birth year], 70 y.o.   MRN: [id number]  HPI Comments: 70 years old with CAD, peripheral vascular disease,  bypass surgery in 1992,  carotid endarterectomy on the right and has had multiple surgical procedures on his lower extremity by Dr. Arbie Cookey. chronically occluded left carotid. This is .Most recent ultrasound of his carotid shows 40-59% disease on the right, chronically occluded left carotid.  2012 ABIs were moderately depressed in his lower extremities. he does have some symptoms of claudication but this has been ongoing for long periods of time. S/p renal stent for severe HTN.  Blood pressure has been slowly improving though he has needed significant medical management. On his last clinic visit, we suggested he increase his hydralazine. He reports systolic pressures in the 150-160 range. He does report having 2 episodes of blurry vision Since the renal stent was placed. They lasted only a short period and have since resolved. He did have a similar episode when we previously tried him on clonidine and it was felt secondary to medication and the medication was discontinued.    He is able to exert himself without any significant chest pain. He has not had a cardiac catheterization or stress test in 10 years. He believes he had a catheterization 10 years ago, 3 catheterizations since his bypass surgery 20 years ago.    He took himself off Crestor as he believes it was making his memory worse. He reports that he does take simvastatin 40 mg daily with zetia 10 mg daily  He quit smoking after his bypass surgery in 1992.      EKG shows normal sinus rhythm with rate 56 beats per minute, nonspecific ST and T wave changes in V5 and V6      Outpatient Encounter Prescriptions as of 12/20/2011  Medication Sig Dispense Refill  . aspirin 81 MG tablet Take 162 mg by mouth daily.       . clopidogrel (PLAVIX) 75 MG tablet Take 1 tablet (75 mg total) by mouth daily  with breakfast.  30 tablet  12  . co-enzyme Q-10 30 MG capsule Take 100 mg by mouth daily.       Marland Kitchen ezetimibe (ZETIA) 10 MG tablet Take 1 tablet (10 mg total) by mouth daily.  90 tablet  3  . fish oil-omega-3 fatty acids 1000 MG capsule Take 3 g by mouth 2 (two) times daily.       . Flaxseed, Linseed, 1000 MG CAPS Take 1 capsule by mouth daily.        . Garlic 1000 MG CAPS Take 7 capsules by mouth daily.        . hydrALAZINE (APRESOLINE) 100 MG tablet Take 1 tablet (100 mg total) by mouth 4 (four) times daily.  120 tablet  11  . hydrochlorothiazide 25 MG tablet Take 0.5 tablets (12.5 mg total) by mouth daily.  30 tablet  6  . isosorbide dinitrate (ISOCHRON) 40 MG CR tablet Take 1 tablet (40 mg total) by mouth 3 (three) times daily.  90 tablet  6  . Multiple Vitamins-Minerals (MULTIVITAMINS THER. W/MINERALS) TABS Take 1 tablet by mouth daily.        . nitroGLYCERIN (NITROLINGUAL) 0.4 MG/SPRAY spray Place 1 spray under the tongue every 5 (five) minutes as needed. For chest pain      . nortriptyline (PAMELOR) 25 MG capsule Take 75 mg by mouth at bedtime. For sleeping      . polyethylene glycol (  MIRALAX / GLYCOLAX) packet Take 17 g by mouth daily.        . Ranitidine HCl (RANITIDINE 75 PO) Take 1 tablet by mouth daily.        . simvastatin (ZOCOR) 40 MG tablet Take 1 tablet (40 mg total) by mouth every evening.  90 tablet  3  . verapamil (COVERA HS) 240 MG (CO) 24 hr tablet Take 240 mg by mouth daily.        Marland Kitchen Zn-Pyg Afri-Nettle-Saw Palmet (SAW PALMETTO COMPLEX PO) Take 2 tablets by mouth daily.       Marland Kitchen lisinopril (PRINIVIL,ZESTRIL) 20 MG tablet Take 1 tablet (20 mg total) by mouth daily.  90 tablet  3     Review of Systems  Constitutional: Negative.   HENT: Negative.   Eyes: Negative.   Respiratory: Negative.   Cardiovascular: Negative.   Gastrointestinal: Negative.   Musculoskeletal: Negative.   Skin: Negative.   Neurological: Negative.        Blurry vision on 2 episodes since his renal  stent was placed  Hematological: Negative.   Psychiatric/Behavioral: Negative.   All other systems reviewed and are negative.    BP 136/58  Pulse 56  Ht 5\' 9"  (1.753 m)  Wt 178 lb 6.4 oz (80.922 kg)  BMI 26.35 kg/m2  Physical Exam  Nursing note and vitals reviewed. Constitutional: He is oriented to person, place, and time. He appears well-developed and well-nourished.  HENT:  Head: Normocephalic.  Nose: Nose normal.  Mouth/Throat: Oropharynx is clear and moist.  Eyes: Conjunctivae are normal. Pupils are equal, round, and reactive to light.  Neck: Normal range of motion. Neck supple. No JVD present. Carotid bruit is present.  Cardiovascular: Normal rate, regular rhythm, S1 normal, S2 normal and intact distal pulses.  Exam reveals no gallop and no friction rub.   Murmur heard.  Crescendo systolic murmur is present with a grade of 2/6  Pulmonary/Chest: Effort normal and breath sounds normal. No respiratory distress. He has no wheezes. He has no rales. He exhibits no tenderness.  Abdominal: Soft. Bowel sounds are normal. He exhibits no distension. There is no tenderness.  Musculoskeletal: Normal range of motion. He exhibits no edema and no tenderness.  Lymphadenopathy:    He has no cervical adenopathy.  Neurological: He is alert and oriented to person, place, and time. Coordination normal.  Skin: Skin is warm and dry. No rash noted. No erythema.  Psychiatric: He has a normal mood and affect. His behavior is normal. Judgment and thought content normal.           Assessment and Plan

## 2011-12-20 NOTE — Assessment & Plan Note (Signed)
Occluded left carotid, 40-50% disease on the right which is stable. Ultrasound performed January 2012 and repeated October 2012.

## 2011-12-20 NOTE — Assessment & Plan Note (Signed)
Currently with no symptoms of angina. No further workup at this time. Continue current medication regimen. 

## 2011-12-30 ENCOUNTER — Telehealth: Payer: Self-pay | Admitting: *Deleted

## 2011-12-30 ENCOUNTER — Encounter: Payer: Self-pay | Admitting: Cardiovascular Disease

## 2011-12-30 NOTE — Telephone Encounter (Signed)
Wife called said since starting Lisinopril 20mg  (added at ov on 12/20/11 for continued elev BP) pt has had decr BP; yesterday became dizzy and BP dropped from 160 to 107/45. Pt has stopped Lisinopril. He will continue to monitor BP off the lisinopril and I have asked him to call in some BP numbers in next few days. They are out of town at this time. Pt was on Benazepril in the past and did well per wife, but this was stopped due to kidney function. Since then he has had renal stent so pt wonders if he could possibly have this as another option in the future if BP med needed. Please advise.

## 2012-01-02 ENCOUNTER — Telehealth: Payer: Self-pay | Admitting: *Deleted

## 2012-01-02 NOTE — Telephone Encounter (Signed)
Per Dr. Mariah Milling, pt can change from Lisinopril to Benazepril 20mg  1/2 tablet daily for 1 week, if not issues, may incr to 20mg  daily. Attempted to contact pt, LMOM TCB.

## 2012-01-03 MED ORDER — BENAZEPRIL HCL 20 MG PO TABS: ORAL_TABLET | ORAL | Status: DC

## 2012-01-03 NOTE — Telephone Encounter (Signed)
Pt's wife notified that we have sent in Benazepril 20mg  take 1/2 tablet once daily x 1 week then increase to whole tablet per Dr. Mariah Milling. Pt will call back with BP numbers after change.

## 2012-01-07 ENCOUNTER — Telehealth: Payer: Self-pay | Admitting: *Deleted

## 2012-01-07 NOTE — Telephone Encounter (Signed)
Pt's wife calling stating he is having problem with seeing spots/lights again. He is not dizzy, able to tolerate. BP has actually been much improved after starting 1/2 benazepril 20mg  (started last Saturday).BP: 151/63, 181/62, 176/62, 167, 140s, 130s. These symptoms began yesterday, and again this AM. He had this problem before and it was when he took clonidine per wife. When he stopped clonidine sx resolved. Please advise.

## 2012-01-08 NOTE — Telephone Encounter (Signed)
Could hold benazepril for a few days to see if vision changes go away

## 2012-01-08 NOTE — Telephone Encounter (Signed)
Pt says today he has had no problems today and took benazepril, he even took this in the past and did not have any problems. Pt wants to know what else could be the cause; he is going to still hold benazepril IF the symptoms return.

## 2012-01-08 NOTE — Telephone Encounter (Signed)
Closed encounter by accident, please see last note below.

## 2012-01-09 ENCOUNTER — Telehealth: Payer: Self-pay | Admitting: *Deleted

## 2012-01-09 NOTE — Telephone Encounter (Signed)
Pt's wife just called, PCP found low thyroid, pt is going to start synthroid 25mg  daily. With his new dx of hyperthyroidism, wife is asking if Dr. Mariah Milling thinks this is related to causing his high BP? Pt does also have h/o renal arterial dz with recent stent and PVD. Will forward to Dr. Mariah Milling.

## 2012-01-09 NOTE — Telephone Encounter (Signed)
Thyroid probably unrelated to bp

## 2012-01-09 NOTE — Telephone Encounter (Signed)
Don't know what is causing vision issues. Atypical migraines? Small Tia? Does he have eye doctor?

## 2012-01-10 NOTE — Telephone Encounter (Signed)
He does have eye doctor, saw few months back for this same problem when he was on clonidine, eval at eye doctor was normal. He has stayed on the Benazepril 1/2 dose and no consistent episodes with vision after taking this med, so I told them prob not related, possible other causes as listed. Dr. Mariah Milling are you ok with him incr the Benazepril as he had planned, or do you think any relation to vision? BP's recently: 138/47, 151/63, 181/62, 176/62, 167/58.

## 2012-01-10 NOTE — Telephone Encounter (Signed)
Attempted to contact pt, LMOM TCB.  

## 2012-01-10 NOTE — Telephone Encounter (Signed)
Pt.notified

## 2012-01-12 NOTE — Telephone Encounter (Signed)
Ok to increase. Probably unrelated to benazepril

## 2012-01-13 NOTE — Telephone Encounter (Signed)
Pt notified, he will call back with BP numbers after the Benazepril increased.

## 2012-01-13 NOTE — Telephone Encounter (Signed)
Attempted to contact pt, LMOM TCB.  

## 2012-01-20 ENCOUNTER — Encounter: Payer: Self-pay | Admitting: Vascular Surgery

## 2012-01-21 ENCOUNTER — Other Ambulatory Visit: Payer: Medicare Other | Admitting: *Deleted

## 2012-01-21 ENCOUNTER — Ambulatory Visit (INDEPENDENT_AMBULATORY_CARE_PROVIDER_SITE_OTHER): Payer: Medicare Other | Admitting: *Deleted

## 2012-01-21 ENCOUNTER — Ambulatory Visit (INDEPENDENT_AMBULATORY_CARE_PROVIDER_SITE_OTHER): Payer: Medicare Other | Admitting: Vascular Surgery

## 2012-01-21 ENCOUNTER — Encounter: Payer: Self-pay | Admitting: Vascular Surgery

## 2012-01-21 VITALS — BP 158/69 | HR 64 | Resp 16 | Ht 69.0 in | Wt 175.9 lb

## 2012-01-21 DIAGNOSIS — I70219 Atherosclerosis of native arteries of extremities with intermittent claudication, unspecified extremity: Secondary | ICD-10-CM

## 2012-01-21 DIAGNOSIS — N186 End stage renal disease: Secondary | ICD-10-CM

## 2012-01-21 DIAGNOSIS — Z48812 Encounter for surgical aftercare following surgery on the circulatory system: Secondary | ICD-10-CM

## 2012-01-21 DIAGNOSIS — I701 Atherosclerosis of renal artery: Secondary | ICD-10-CM

## 2012-01-21 NOTE — Progress Notes (Signed)
The patient is here today for followup of his left iliac stent and left renal angioplasty with Dr. Myra Gianotti on 12/03/2011. He reports that he has had dramatic relief in the claudication in walking discomfort in his left leg. He also reports that he has had improvement in his hypertension and is having adjustment of his medical regimen due to this. His main complaint today is of. Episodes of dizziness and left vision loss. This comes and goes with his fluctuation in blood pressure. He does have to have a known left internal carotid artery occlusion.  Past Medical History  Diagnosis Date  . PVD (peripheral vascular disease)     s/p multiple revascularization procedures.  . Carotid artery occlusion     s/p right carotid endarterectomy  . Coronary artery disease     s/p coronary bypass graft surgery in 1992, stable  . Hyperlipidemia   . Hypertension   . Myocardial infarction   . Shortness of breath   . Chronic kidney disease     renal artery stenosis  . Cancer     basel cell carcinoma left ear  . GERD (gastroesophageal reflux disease)     History  Substance Use Topics  . Smoking status: Former Smoker    Types: Cigarettes    Quit date: 12/30/1990  . Smokeless tobacco: Not on file   Comment: quit smoking in 1992  . Alcohol Use: 0.6 oz/week    1 Glasses of wine per week    Family History  Problem Relation Age of Onset  . Heart disease Other   . Stroke Mother   . Stroke Sister   . Stroke Brother     Allergies  Allergen Reactions  . Adhesive (Tape)     SKIN SENSITIVITY  . Clonidine Derivatives Other (See Comments)    Caused blindness in left eye for the days on the drug  . Isosorbide Mononitrate Rash    Current outpatient prescriptions:aspirin 81 MG tablet, Take 162 mg by mouth daily. , Disp: , Rfl: ;  benazepril (LOTENSIN) 20 MG tablet, Take 1/2 tablet daily for 1 week, then increase to 1 whole tablet daily., Disp: 30 tablet, Rfl: 11;  clopidogrel (PLAVIX) 75 MG tablet, Take 1  tablet (75 mg total) by mouth daily with breakfast., Disp: 30 tablet, Rfl: 12;  co-enzyme Q-10 30 MG capsule, Take 100 mg by mouth daily. , Disp: , Rfl:  ezetimibe (ZETIA) 10 MG tablet, Take 1 tablet (10 mg total) by mouth daily., Disp: 90 tablet, Rfl: 3;  fish oil-omega-3 fatty acids 1000 MG capsule, Take 3 g by mouth 2 (two) times daily. , Disp: , Rfl: ;  Flaxseed, Linseed, 1000 MG CAPS, Take 1 capsule by mouth daily.  , Disp: , Rfl: ;  Garlic 1000 MG CAPS, Take 7 capsules by mouth daily.  , Disp: , Rfl:  hydrALAZINE (APRESOLINE) 100 MG tablet, Take 1 tablet (100 mg total) by mouth 4 (four) times daily., Disp: 120 tablet, Rfl: 11;  hydrochlorothiazide 25 MG tablet, Take 0.5 tablets (12.5 mg total) by mouth daily., Disp: 30 tablet, Rfl: 6;  isosorbide dinitrate (ISOCHRON) 40 MG CR tablet, Take 1 tablet (40 mg total) by mouth 3 (three) times daily., Disp: 90 tablet, Rfl: 6 levothyroxine (SYNTHROID, LEVOTHROID) 25 MCG tablet, Take 25 mcg by mouth daily., Disp: , Rfl: ;  Multiple Vitamins-Minerals (MULTIVITAMINS THER. W/MINERALS) TABS, Take 1 tablet by mouth daily.  , Disp: , Rfl: ;  nitroGLYCERIN (NITROLINGUAL) 0.4 MG/SPRAY spray, Place 1 spray under the tongue  every 5 (five) minutes as needed. For chest pain, Disp: , Rfl:  nortriptyline (PAMELOR) 25 MG capsule, Take 75 mg by mouth at bedtime. For sleeping, Disp: , Rfl: ;  polyethylene glycol (MIRALAX / GLYCOLAX) packet, Take 17 g by mouth daily.  , Disp: , Rfl: ;  Ranitidine HCl (RANITIDINE 75 PO), Take 1 tablet by mouth daily.  , Disp: , Rfl: ;  simvastatin (ZOCOR) 40 MG tablet, Take 1 tablet (40 mg total) by mouth every evening., Disp: 90 tablet, Rfl: 3 verapamil (COVERA HS) 240 MG (CO) 24 hr tablet, Take 240 mg by mouth daily.  , Disp: , Rfl: ;  Zn-Pyg Afri-Nettle-Saw Palmet (SAW PALMETTO COMPLEX PO), Take 2 tablets by mouth daily. Pt. States he takes 2 tablets (each of which are 1725 mg) daily., Disp: , Rfl:   BP 158/69  Pulse 64  Resp 16  Ht 5\' 9"   (1.753 m)  Wt 175 lb 14.4 oz (79.788 kg)  BMI 25.98 kg/m2  Body mass index is 25.98 kg/(m^2).       Does go exam palpable femoral pulses bilaterally. His feet are well perfused. Carotid arteries without bruits bilaterally. Abdomen with no bruits.  Vascular lab does have ankle arm index of 0.6 bilaterally with monophasic waveforms in both lower Shoney's.  Impression and plan: Successful left iliac stenting and left renal artery stenting. He has had significant improvement in his left leg walking symptoms. He has known superficial femoral artery occlusions bilaterally. He will continue to followup with his medical doctor regarding blood pressure management. We will see him in 2 months with a duplex of his renal stent.

## 2012-02-13 DIAGNOSIS — I70219 Atherosclerosis of native arteries of extremities with intermittent claudication, unspecified extremity: Secondary | ICD-10-CM

## 2012-02-17 ENCOUNTER — Telehealth: Payer: Self-pay | Admitting: Cardiovascular Disease

## 2012-02-17 NOTE — Telephone Encounter (Signed)
Spoke with Pt's wife regarding pt having a reaction to Isosorbide and Hydralazine medications. Symptoms of White patches and decreased vision in both eyes. Wife states pt  stopped taken one of these medications at the time and the symptoms went away, then he re-started taken these medication  one at the time, the symptoms reappeared  Again. Pt stopped taken these two medications  February 9 th, no symptoms now . This AM his B/P was 173/80 this  afternoon is 136/68. B/P gets better in the afternoon. Pt. Consulted with CV surgeon regarding the Carotid artery being blocked 100 % and assured him  that it wouldn't cause those symptoms.

## 2012-02-17 NOTE — Telephone Encounter (Signed)
I believe he had a similar problem on clonidine as well. Makes me wonder if it is not the medication. Which medication is causing the sx? Doubt both would do this, and clonidine. Can they distinguish which one?

## 2012-02-17 NOTE — Telephone Encounter (Signed)
Attempted to call pt phone ringed several time with no answer.

## 2012-02-17 NOTE — Telephone Encounter (Signed)
Pt wife calling with BP readings. Pt was having a reaction to some medications and they know know what they were. Isosorbide, hydralazine. Pt BP readings are 173/80 -136/68 range within the last 10 days. Has stop the above named meds and had no reactions. White Patches in his eye or no sight at all while he was taking these meds.

## 2012-02-18 NOTE — Telephone Encounter (Signed)
Spoke with patient's wife this Am, she states, patient took Hydralazine medication only no isosorbide  one week, He had   The side effects described previously with this medication. The next week he took the Isosorbide medication only, patient had the same side effects as when he took the Hydralazine. The symptoms went away when he stopped  taken both medications.

## 2012-02-18 NOTE — Telephone Encounter (Signed)
Symptoms concerning for blockage , intracranial Running out of options.  Could try amlodipine 5 mg at bedtime in addition to other meds. This should not drop heart rate. Stay on verapamil/HCTZ/benazepril

## 2012-02-19 ENCOUNTER — Other Ambulatory Visit: Payer: Self-pay | Admitting: *Deleted

## 2012-02-19 MED ORDER — AMLODIPINE BESYLATE 5 MG PO TABS: 5.0000 mg | ORAL_TABLET | Freq: Every day | ORAL | Status: DC

## 2012-02-19 NOTE — Telephone Encounter (Signed)
N/A.  LMTC. 

## 2012-02-19 NOTE — Telephone Encounter (Signed)
Fu call °Patient returning your call °

## 2012-02-19 NOTE — Telephone Encounter (Signed)
Spoke with pt's spouse and will start amlodipine 5mg  at bedtime.

## 2012-02-20 MED ORDER — BENAZEPRIL HCL 20 MG PO TABS: 20.0000 mg | ORAL_TABLET | Freq: Two times a day (BID) | ORAL | Status: DC

## 2012-02-20 NOTE — Telephone Encounter (Signed)
Fu call Pt's wife has another question Please call

## 2012-02-20 NOTE — Telephone Encounter (Signed)
Patient's wife called because she said MD would like for pt to start on Norvasc 5 mg daily starting 02/19/12. Wife states patient is afraid to try a new medication,  because the side effects he has with other medications. Wife states patient is  taken Benazepril 20 mg daily, he  took an extra Benazepril 20 mg yesterday evening his B/P was 131/59 this AM, which  Is Usually   in the 150 to 180 Systolic. Patient  would like to take Benazepril twice a day instead of starting on Norvasc.

## 2012-02-20 NOTE — Telephone Encounter (Signed)
Yes, lets do the benazepril 20 BID Max is 80 daily We would need to monitor renal function if we go higher than 40 mg

## 2012-02-20 NOTE — Telephone Encounter (Signed)
Spoke with pt wife, aware of dr Windell Hummingbird recommendations

## 2012-03-05 ENCOUNTER — Telehealth: Payer: Self-pay | Admitting: Cardiovascular Disease

## 2012-03-05 NOTE — Telephone Encounter (Signed)
These are not perfect, but not bad for him. I think we should monitor now for a while before making more changes

## 2012-03-05 NOTE — Telephone Encounter (Signed)
Will forward to Dr Gollan  

## 2012-03-05 NOTE — Telephone Encounter (Signed)
Patient wife Clifford Aguilar [phone]   Clifford Aguilar calling to leave patient Blood Pressure info for Dr. Mariah Milling 2/21- 131 ov/ 59  2/24- 155 ov/65  2/25- 146 ov/65  2/26- 154 ov/67 2/27- 154 ov/91 2/28- 137 ov/55  3/1-   146 ov/63  3/2-   147 ov/69 3/4-   152 ov/65

## 2012-03-06 NOTE — Telephone Encounter (Signed)
Left message for pt to continue monitoring BP.  Requested he or his wife call back if questions or needs

## 2012-03-16 ENCOUNTER — Other Ambulatory Visit: Payer: Self-pay | Admitting: Cardiovascular Disease

## 2012-03-16 MED ORDER — SIMVASTATIN 40 MG PO TABS: 40.0000 mg | ORAL_TABLET | Freq: Every evening | ORAL | Status: DC

## 2012-03-16 MED ORDER — EZETIMIBE 10 MG PO TABS: 10.0000 mg | ORAL_TABLET | Freq: Every day | ORAL | Status: DC

## 2012-03-24 ENCOUNTER — Other Ambulatory Visit: Payer: Self-pay | Admitting: *Deleted

## 2012-03-24 MED ORDER — EZETIMIBE 10 MG PO TABS: 10.0000 mg | ORAL_TABLET | Freq: Every day | ORAL | Status: DC

## 2012-03-24 MED ORDER — SIMVASTATIN 40 MG PO TABS: 40.0000 mg | ORAL_TABLET | Freq: Every evening | ORAL | Status: DC

## 2012-03-31 ENCOUNTER — Ambulatory Visit: Payer: Medicare Other | Admitting: Vascular Surgery

## 2012-03-31 ENCOUNTER — Ambulatory Visit (INDEPENDENT_AMBULATORY_CARE_PROVIDER_SITE_OTHER): Payer: Medicare Other | Admitting: Vascular Surgery

## 2012-03-31 ENCOUNTER — Other Ambulatory Visit: Payer: Medicare Other | Admitting: Vascular Surgery

## 2012-03-31 DIAGNOSIS — I739 Peripheral vascular disease, unspecified: Secondary | ICD-10-CM

## 2012-03-31 DIAGNOSIS — I70219 Atherosclerosis of native arteries of extremities with intermittent claudication, unspecified extremity: Secondary | ICD-10-CM

## 2012-03-31 DIAGNOSIS — I701 Atherosclerosis of renal artery: Secondary | ICD-10-CM

## 2012-03-31 DIAGNOSIS — Z48812 Encounter for surgical aftercare following surgery on the circulatory system: Secondary | ICD-10-CM

## 2012-03-31 NOTE — Progress Notes (Signed)
Renal artery duplex  with iliac arteries included performed @ [facility] 03/31/2012

## 2012-03-31 NOTE — Progress Notes (Signed)
ABI performed @ [facility] 03/31/2012

## 2012-04-09 NOTE — Procedures (Unsigned)
RENAL ARTERY DUPLEX EVALUATION  INDICATION:  Renal artery stenosis, history of left renal artery stent placement in January 2013.  HISTORY: Diabetes:  No. Cardiac:  CAD, MI, CABG. Hypertension:  Yes. Smoking:  Previous.  RENAL ARTERY DUPLEX FINDINGS: Aorta-Proximal:  71 cm/s Aorta-Mid:  56 cm/s Aorta-Distal:  52 cm/s Celiac Artery Origin:  687 cm/s SMA Origin:  485 cm/s                                   RIGHT               LEFT Renal Artery Origin:             121 cm/s            221 cm/s Renal Artery Proximal:           96 cm/s             214 cm/s Renal Artery Mid:                205 cm/s            84 cm/s Renal Artery Distal:             125 cm/s            87 cm/s Hilar Acceleration Time (AT): Renal-Aortic Ratio (RAR):        2.89                3.11 Kidney Size:                     12.4 cm             11.3 cm End Diastolic Ratio (EDR): Resistive Index (RI):            0.71                0.66  IMPRESSION: 1. Patent abdominal aorta with mild heterogenous plaque present     throughout. 2. Elevated velocities present involving the celiac and the superior     mesenteric artery, suggesting >75% stenosis. 3. Patent right renal artery with vessel tortuosity present, renal     aortic ratio of <3.5, indicative of <60% stenosis. 4. Left renal artery stent is patent with mildly elevated velocities     and a renal aortic ratio of <3.5, suggestive of <60% stenosis. 5. Elevated velocities present involving the right distal common iliac     artery, mid to distal right external iliac artery, and right common     femoral artery, suggesting 50% to 75% stenosis. 6. Elevated velocities present involving the left distal external     iliac and left profunda femoral artery, suggesting stenosis in the     50% to 75% range. 7. Elevated velocities present in the left common femoral artery of     >400 cm/s, suggesting a >75% stenosis. 8. See attached worksheet for all  velocity measurements of the iliac     arteries.  ___________________________________________ Larina Earthly, M.D.  SH/MEDQ  D:  04/01/2012  T:  04/01/2012  Job:  [id number]

## 2012-06-15 ENCOUNTER — Ambulatory Visit: Payer: Medicare Other | Admitting: Cardiovascular Disease

## 2012-06-29 ENCOUNTER — Ambulatory Visit (INDEPENDENT_AMBULATORY_CARE_PROVIDER_SITE_OTHER): Payer: Medicare Other | Admitting: Cardiovascular Disease

## 2012-06-29 ENCOUNTER — Telehealth: Payer: Self-pay

## 2012-06-29 ENCOUNTER — Encounter: Payer: Self-pay | Admitting: Cardiovascular Disease

## 2012-06-29 VITALS — BP 120/60 | HR 60 | Ht 69.0 in | Wt 172.5 lb

## 2012-06-29 DIAGNOSIS — I739 Peripheral vascular disease, unspecified: Secondary | ICD-10-CM

## 2012-06-29 DIAGNOSIS — E785 Hyperlipidemia, unspecified: Secondary | ICD-10-CM

## 2012-06-29 DIAGNOSIS — I10 Essential (primary) hypertension: Secondary | ICD-10-CM

## 2012-06-29 DIAGNOSIS — I2510 Atherosclerotic heart disease of native coronary artery without angina pectoris: Secondary | ICD-10-CM

## 2012-06-29 DIAGNOSIS — I6529 Occlusion and stenosis of unspecified carotid artery: Secondary | ICD-10-CM

## 2012-06-29 MED ORDER — NITROGLYCERIN 0.4 MG SL SUBL: 0.4000 mg | SUBLINGUAL_TABLET | SUBLINGUAL | Status: DC | PRN

## 2012-06-29 NOTE — Patient Instructions (Addendum)
You are doing well. No medication changes were made.  Please call us if you have new issues that need to be addressed before your next appt.  Your physician wants you to follow-up in: 6 months.  You will receive a reminder letter in the mail two months in advance. If you don't receive a letter, please call our office to schedule the follow-up appointment.   

## 2012-06-29 NOTE — Assessment & Plan Note (Signed)
Cholesterol is at goal on the current lipid regimen. No changes to the medications were made.  

## 2012-06-29 NOTE — Progress Notes (Signed)
Patient ID: Clifford Aguilar, male    DOB: 1941/02/04, 71 y.o.   MRN: [id number]  HPI Comments: 71 years old with CAD, peripheral vascular disease,  bypass surgery in 1992,  carotid endarterectomy on the right and has had multiple surgical procedures on his lower extremity by Dr. Arbie Cookey, left iliac stent and left renal angioplasty with Dr. Myra Gianotti on 12/03/2011, chronically occluded left carotid.  ultrasound 09/2011 of his carotid shows 40-59% disease on the right, chronically occluded left carotid.   h S/p renal stent for severe HTN.  He reports having improvement of his vision since his last clinic visit. No further blurry vision episodes. His blood pressure has been well-controlled on his current medication regimen. He has improvement of his claudication type symptoms. No significant shortness of breath or chest pain. Overall he feels well. He has been doing light exercise and yoga. His weight is down 6 pounds from his last clinic visit.     He has not had a cardiac catheterization or stress test in 10 years. He believes he had a catheterization 10 years ago, 3 catheterizations since his bypass surgery 20 years ago.    He took himself off Crestor as he believes it was making his memory worse. doing well on simvastatin 40 mg daily with zetia 10 mg daily He quit smoking after his bypass surgery in 1992.      EKG shows normal sinus rhythm with rate 60 beats per minute, no significant ST or T wave changes.      Outpatient Encounter Prescriptions as of 06/29/2012  Medication Sig Dispense Refill  . aspirin 81 MG tablet Take 81 mg by mouth 2 (two) times daily.       . benazepril (LOTENSIN) 20 MG tablet Take 20 mg by mouth 2 (two) times daily.       . clopidogrel (PLAVIX) 75 MG tablet Take 1 tablet (75 mg total) by mouth daily with breakfast.  30 tablet  12  . Coenzyme Q10 (CO Q-10) 100 MG CAPS Take 100 mg by mouth daily.      Marland Kitchen ezetimibe (ZETIA) 10 MG tablet Take 1 tablet (10 mg total) by mouth  daily.  90 tablet  3  . fish oil-omega-3 fatty acids 1000 MG capsule Take 3 g by mouth 2 (two) times daily.       . Flaxseed, Linseed, 1000 MG CAPS Take 1 capsule by mouth daily.        Marland Kitchen GARLIC PO Take 4,098 mg by mouth. Takes 7 tablets daily.      . hydrochlorothiazide (HYDRODIURIL) 12.5 MG tablet Take 12.5 mg by mouth daily.      Marland Kitchen levothyroxine (SYNTHROID, LEVOTHROID) 25 MCG tablet Take 25 mcg by mouth daily.      . Multiple Vitamins-Minerals (MULTIVITAMINS THER. W/MINERALS) TABS Take 1 tablet by mouth daily.        . nitroGLYCERIN (NITROLINGUAL) 0.4 MG/SPRAY spray Place 1 spray under the tongue every 5 (five) minutes as needed. For chest pain      . nortriptyline (PAMELOR) 25 MG capsule Take 75 mg by mouth at bedtime. For sleeping      . polyethylene glycol (MIRALAX / GLYCOLAX) packet Take 17 g by mouth daily.        . Ranitidine HCl (RANITIDINE 75 PO) Take 1 tablet by mouth daily.        . simvastatin (ZOCOR) 40 MG tablet Take 1 tablet (40 mg total) by mouth every evening.  90 tablet  3  .  verapamil (COVERA HS) 240 MG (CO) 24 hr tablet Take 240 mg by mouth daily.        Marland Kitchen Zn-Pyg Afri-Nettle-Saw Palmet (SAW PALMETTO COMPLEX PO) Take 2 tablets by mouth daily. Pt. States he takes 2 tablets (each of which are 1725 mg) daily.        Review of Systems  Constitutional: Negative.   HENT: Negative.   Eyes: Negative.   Respiratory: Negative.   Cardiovascular: Negative.   Gastrointestinal: Negative.   Musculoskeletal: Negative.   Skin: Negative.   Neurological: Negative.   Hematological: Negative.   Psychiatric/Behavioral: Negative.   All other systems reviewed and are negative.    BP 120/60  Pulse 60  Ht 5\' 9"  (1.753 m)  Wt 172 lb 8 oz (78.245 kg)  BMI 25.47 kg/m2  Physical Exam  Nursing note and vitals reviewed. Constitutional: He is oriented to person, place, and time. He appears well-developed and well-nourished.  HENT:  Head: Normocephalic.  Nose: Nose normal.    Mouth/Throat: Oropharynx is clear and moist.  Eyes: Conjunctivae are normal. Pupils are equal, round, and reactive to light.  Neck: Normal range of motion. Neck supple. No JVD present. Carotid bruit is present.  Cardiovascular: Normal rate, regular rhythm, S1 normal, S2 normal and intact distal pulses.  Exam reveals no gallop and no friction rub.   Murmur heard.  Crescendo systolic murmur is present with a grade of 2/6  Pulmonary/Chest: Effort normal and breath sounds normal. No respiratory distress. He has no wheezes. He has no rales. He exhibits no tenderness.  Abdominal: Soft. Bowel sounds are normal. He exhibits no distension. There is no tenderness.  Musculoskeletal: Normal range of motion. He exhibits no edema and no tenderness.  Lymphadenopathy:    He has no cervical adenopathy.  Neurological: He is alert and oriented to person, place, and time. Coordination normal.  Skin: Skin is warm and dry. No rash noted. No erythema.  Psychiatric: He has a normal mood and affect. His behavior is normal. Judgment and thought content normal.           Assessment and Plan

## 2012-06-29 NOTE — Assessment & Plan Note (Signed)
No significant claudication type symptoms. Followed by vascular in [place].

## 2012-06-29 NOTE — Assessment & Plan Note (Signed)
Repeat carotid ultrasound scheduled October 2013

## 2012-06-29 NOTE — Assessment & Plan Note (Signed)
Blood pressure is well controlled on today's visit. No changes made to the medications. 

## 2012-06-29 NOTE — Telephone Encounter (Signed)
Pt here for OV. Asked why carotid u/s was cx'd last week. Dr. Mariah Milling told pt we would investigate and call him back.  Dr. Mariah Milling reviewed chart and decided to repeat carotid u/s as scheduled in October. Pt only needs these yearly vs. q6 months.   This is b/c findings have gone unchanged since 2010.  I called pt to inform but had to [facility]

## 2012-06-29 NOTE — Assessment & Plan Note (Signed)
Currently with no symptoms of angina. No further workup at this time. Continue current medication regimen. 

## 2012-07-03 ENCOUNTER — Other Ambulatory Visit: Payer: Self-pay | Admitting: *Deleted

## 2012-07-03 MED ORDER — HYDROCHLOROTHIAZIDE 12.5 MG PO TABS: 12.5000 mg | ORAL_TABLET | Freq: Every day | ORAL | Status: DC

## 2012-07-03 NOTE — Telephone Encounter (Signed)
Refilled Hydrochlorothiazide. 

## 2012-07-16 ENCOUNTER — Ambulatory Visit: Payer: Self-pay | Admitting: Family Medicine

## 2012-09-02 ENCOUNTER — Other Ambulatory Visit: Payer: Self-pay

## 2012-09-02 MED ORDER — HYDROCHLOROTHIAZIDE 12.5 MG PO TABS: 12.5000 mg | ORAL_TABLET | Freq: Every day | ORAL | Status: DC

## 2012-09-02 NOTE — Telephone Encounter (Signed)
Refill sent for hydrochlorothiazide  

## 2012-10-08 ENCOUNTER — Encounter (INDEPENDENT_AMBULATORY_CARE_PROVIDER_SITE_OTHER): Payer: Medicare Other

## 2012-10-08 DIAGNOSIS — I6529 Occlusion and stenosis of unspecified carotid artery: Secondary | ICD-10-CM

## 2012-11-05 ENCOUNTER — Other Ambulatory Visit: Payer: Self-pay | Admitting: *Deleted

## 2012-11-05 DIAGNOSIS — I6529 Occlusion and stenosis of unspecified carotid artery: Secondary | ICD-10-CM

## 2012-11-09 ENCOUNTER — Encounter: Payer: Self-pay | Admitting: Vascular Surgery

## 2012-11-10 ENCOUNTER — Ambulatory Visit (INDEPENDENT_AMBULATORY_CARE_PROVIDER_SITE_OTHER): Payer: Medicare Other | Admitting: Vascular Surgery

## 2012-11-10 ENCOUNTER — Encounter: Payer: Self-pay | Admitting: Vascular Surgery

## 2012-11-10 ENCOUNTER — Other Ambulatory Visit (INDEPENDENT_AMBULATORY_CARE_PROVIDER_SITE_OTHER): Payer: Medicare Other | Admitting: *Deleted

## 2012-11-10 ENCOUNTER — Other Ambulatory Visit: Payer: Self-pay | Admitting: Vascular Surgery

## 2012-11-10 ENCOUNTER — Telehealth: Payer: Self-pay | Admitting: Vascular Surgery

## 2012-11-10 VITALS — BP 146/76 | HR 61 | Resp 20 | Ht 69.0 in | Wt 174.0 lb

## 2012-11-10 DIAGNOSIS — Z48812 Encounter for surgical aftercare following surgery on the circulatory system: Secondary | ICD-10-CM

## 2012-11-10 DIAGNOSIS — I6529 Occlusion and stenosis of unspecified carotid artery: Secondary | ICD-10-CM

## 2012-11-10 NOTE — Progress Notes (Signed)
Patient presents today for discussion of extracranial cerebrovascular occlusive disease. He is well known to our practice for multiple prior interventions. He is status post right carotid endarterectomy by Dr. Madilyn Fireman in 1993. He has a known history of left internal carotid artery occlusion. He has had progression in the degree of stenosis on the right. He has no symptoms referable to this. He does have a history of prior peripheral vascular occlusive disease. He underwent left iliac angioplasty and left renal artery stent angioplasty and stenting one year ago. He reports he has had marked improvement in his walking. Last creatinine I have available is normal at 0.8.  Past Medical History  Diagnosis Date  . PVD (peripheral vascular disease)     s/p multiple revascularization procedures.  . Carotid artery occlusion     s/p right carotid endarterectomy  . Coronary artery disease     s/p coronary bypass graft surgery in 1992, stable  . Hyperlipidemia   . Hypertension   . Myocardial infarction   . Shortness of breath   . Chronic kidney disease     renal artery stenosis  . Cancer     basel cell carcinoma left ear  . GERD (gastroesophageal reflux disease)     History  Substance Use Topics  . Smoking status: Former Smoker    Types: Cigarettes    Quit date: 12/30/1990  . Smokeless tobacco: Former Neurosurgeon    Quit date: 01/10/1991     Comment: quit smoking in 1992  . Alcohol Use: 0.6 oz/week    1 Glasses of wine per week    Family History  Problem Relation Age of Onset  . Heart disease Other   . Stroke Mother   . Stroke Sister   . Stroke Brother     Allergies  Allergen Reactions  . Adhesive (Tape)     SKIN SENSITIVITY  . Clonidine Derivatives Other (See Comments)    Caused blindness in left eye for the days on the drug  . Lisinopril   . Isosorbide Mononitrate Rash    Current outpatient prescriptions:aspirin 81 MG tablet, Take 81 mg by mouth 2 (two) times daily. , Disp: , Rfl: ;   benazepril (LOTENSIN) 20 MG tablet, Take 20 mg by mouth 2 (two) times daily. , Disp: , Rfl: ;  clopidogrel (PLAVIX) 75 MG tablet, Take 1 tablet (75 mg total) by mouth daily with breakfast., Disp: 30 tablet, Rfl: 12;  Coenzyme Q10 (CO Q-10) 100 MG CAPS, Take 100 mg by mouth daily., Disp: , Rfl:  ezetimibe (ZETIA) 10 MG tablet, Take 1 tablet (10 mg total) by mouth daily., Disp: 90 tablet, Rfl: 3;  GARLIC PO, Take 1,200 mg by mouth. Takes 7 tablets daily., Disp: , Rfl: ;  hydrochlorothiazide (HYDRODIURIL) 12.5 MG tablet, Take 1 tablet (12.5 mg total) by mouth daily., Disp: 30 tablet, Rfl: 6;  levothyroxine (SYNTHROID, LEVOTHROID) 25 MCG tablet, Take 25 mcg by mouth daily., Disp: , Rfl:  Multiple Vitamins-Minerals (MULTIVITAMINS THER. W/MINERALS) TABS, Take 1 tablet by mouth daily.  , Disp: , Rfl: ;  nitroGLYCERIN (NITROLINGUAL) 0.4 MG/SPRAY spray, Place 1 spray under the tongue every 5 (five) minutes as needed. For chest pain, Disp: , Rfl: ;  nitroGLYCERIN (NITROSTAT) 0.4 MG SL tablet, Place 1 tablet (0.4 mg total) under the tongue every 5 (five) minutes as needed for chest pain., Disp: 25 tablet, Rfl: 11 nortriptyline (PAMELOR) 25 MG capsule, Take 75 mg by mouth at bedtime. For sleeping, Disp: , Rfl: ;  polyethylene glycol (  MIRALAX / GLYCOLAX) packet, Take 17 g by mouth daily.  , Disp: , Rfl: ;  Ranitidine HCl (RANITIDINE 75 PO), Take 1 tablet by mouth daily.  , Disp: , Rfl: ;  simvastatin (ZOCOR) 40 MG tablet, Take 1 tablet (40 mg total) by mouth every evening., Disp: 90 tablet, Rfl: 3 verapamil (COVERA HS) 240 MG (CO) 24 hr tablet, Take 240 mg by mouth daily.  , Disp: , Rfl: ;  Zn-Pyg Afri-Nettle-Saw Palmet (SAW PALMETTO COMPLEX PO), Take 2 tablets by mouth daily. Pt. States he takes 2 tablets (each of which are 1725 mg) daily., Disp: , Rfl: ;  fish oil-omega-3 fatty acids 1000 MG capsule, Take 3 g by mouth 2 (two) times daily. , Disp: , Rfl: ;  Flaxseed, Linseed, 1000 MG CAPS, Take 1 capsule by mouth daily.  ,  Disp: , Rfl:   BP 146/76  Pulse 61  Resp 20  Ht 5\' 9"  (1.753 m)  Wt 174 lb (78.926 kg)  BMI 25.70 kg/m2  Body mass index is 25.70 kg/(m^2).       Physical exam well-developed well-nourished white male appearing stated age in no acute distress. He has a well-healed right neck incision with no bruits bilaterally. He has 2+ radial pulses bilaterally. He is grossly intact neurologically. Heart regular rate and rhythm. Chest clear without wheezes or rales.  I did review his duplex from [place] and also R. render repeat of this today. This does show velocity suggesting 60-79% stenosis in the right carotid but there is a significant calcific plaque in NAD underestimating degree of stenosis. Also explained that this may be related to contralateral occlusion.  Impression and plan moderate to severe recurrent right internal carotid artery stenosis with occlusion of left internal carotid artery. I have recommended CT angiogram to further evaluation prior to recommending treatment with continued observation versus recommending re\re intervention in his right carotid. We will have this obtained as an outpatient after assuring that his renal function is stable. We will discuss this as an outpatient visit.

## 2012-11-10 NOTE — Telephone Encounter (Signed)
Per TFE's instructions, I scheduled a cta neck for this patient. It is scheduled on 11/17/12 at 8:15am and the pt is to arrive at 8am. He is to see TFE at 12:15pm at [facility]. I mailed these instructions to the pt and also left a message for him at his home ph#. awt

## 2012-11-16 ENCOUNTER — Encounter: Payer: Self-pay | Admitting: Vascular Surgery

## 2012-11-17 ENCOUNTER — Encounter: Payer: Self-pay | Admitting: Vascular Surgery

## 2012-11-17 ENCOUNTER — Ambulatory Visit
Admission: RE | Admit: 2012-11-17 | Discharge: 2012-11-17 | Disposition: A | Discharge status: Home / Self Care | Payer: Medicare Other | Source: Ambulatory Visit | Attending: Vascular Surgery | Admitting: Vascular Surgery

## 2012-11-17 ENCOUNTER — Ambulatory Visit (INDEPENDENT_AMBULATORY_CARE_PROVIDER_SITE_OTHER): Payer: Medicare Other | Admitting: Vascular Surgery

## 2012-11-17 VITALS — BP 148/67 | HR 63 | Resp 16 | Ht 69.0 in | Wt 176.3 lb

## 2012-11-17 DIAGNOSIS — Z48812 Encounter for surgical aftercare following surgery on the circulatory system: Secondary | ICD-10-CM

## 2012-11-17 DIAGNOSIS — I6529 Occlusion and stenosis of unspecified carotid artery: Secondary | ICD-10-CM

## 2012-11-17 MED ORDER — IOHEXOL 350 MG/ML SOLN: 80.0000 mL | Freq: Once | INTRAVENOUS | Status: AC | PRN

## 2012-11-17 NOTE — Progress Notes (Signed)
Presents today for discussion of CT angiogram performed earlier. I had seen him 1-2 weeks ago with a duplex suggesting potential worsening of his right carotid stenosis. He is 20 years out from a prior right carotid endarterectomy. He has known chronic left carotid occlusion. He has no new symptoms in his history and physical change in one week.  I did review his CT scan. This does show known occlusion of his left internal carotid artery and moderate buildup of soft thrombus around his bifurcation at this prior endarterectomy site. The predicted narrowing is at 65%.  I discussed this at length with the patient and his wife present. I explained we would feel comfortable continue with six-month followup with the duplex. I does no symptoms for carotid disease will notify us me should this occur

## 2012-11-18 NOTE — Addendum Note (Signed)
Addended by: Sharee Pimple on: 11/18/2012 10:51 AM   Modules accepted: Orders

## 2012-12-21 ENCOUNTER — Encounter: Payer: Self-pay | Admitting: Cardiovascular Disease

## 2012-12-21 ENCOUNTER — Ambulatory Visit (INDEPENDENT_AMBULATORY_CARE_PROVIDER_SITE_OTHER): Payer: Medicare Other | Admitting: Cardiovascular Disease

## 2012-12-21 VITALS — BP 132/70 | HR 74 | Ht 69.0 in | Wt 177.2 lb

## 2012-12-21 DIAGNOSIS — E785 Hyperlipidemia, unspecified: Secondary | ICD-10-CM

## 2012-12-21 DIAGNOSIS — I25810 Atherosclerosis of coronary artery bypass graft(s) without angina pectoris: Secondary | ICD-10-CM

## 2012-12-21 DIAGNOSIS — I2510 Atherosclerotic heart disease of native coronary artery without angina pectoris: Secondary | ICD-10-CM

## 2012-12-21 DIAGNOSIS — I6529 Occlusion and stenosis of unspecified carotid artery: Secondary | ICD-10-CM

## 2012-12-21 DIAGNOSIS — I10 Essential (primary) hypertension: Secondary | ICD-10-CM

## 2012-12-21 NOTE — Assessment & Plan Note (Signed)
He has followup with Dr. Arbie Cookey in [place]. We'll continue aggressive cholesterol management.

## 2012-12-21 NOTE — Patient Instructions (Addendum)
You are doing well. No medication changes were made.  We will check your cholesterol in the next months.   Please call us if you have new issues that need to be addressed before your next appt.  Your physician wants you to follow-up in: 6 months.  You will receive a reminder letter in the mail two months in advance. If you don't receive a letter, please call our office to schedule the follow-up appointment.

## 2012-12-21 NOTE — Assessment & Plan Note (Signed)
Cholesterol is close to goal. We have encouraged increased exercise and weight loss recheck early next year. We will put an order in for cholesterol check at his convenience.

## 2012-12-21 NOTE — Assessment & Plan Note (Signed)
Blood pressure is well controlled on today's visit. No changes made to the medications. 

## 2012-12-21 NOTE — Progress Notes (Signed)
Patient ID: Clifford Aguilar, male    DOB: 1941-03-08, 71 y.o.   MRN: [id number]  HPI Comments: 71 years old with CAD, peripheral vascular disease,  bypass surgery in 1992,  carotid endarterectomy on the right and has had multiple surgical procedures on his lower extremity by Dr. Arbie Cookey, left iliac stent and left renal angioplasty with Dr. Myra Gianotti on 12/03/2011, chronically occluded left carotid.  Ultrasound/CT of the neck of his carotid shows 65% disease on the right, chronically occluded left carotid.    S/p renal stent for severe HTN. He does have significant celiac and superior mesenteric arterial disease estimated at greater than 75%. ABIs 0.5 or less bilaterally April 2013.   improvement of his vision on prior clinic visits. No further blurry vision episodes. His blood pressure has been well-controlled on his current medication regimen. Weight is up 5 pounds since his last clinic visit   He continues to have claudication type symptoms when walking. No significant shortness of breath or chest pain. Overall he feels well.      He has not had a cardiac catheterization or stress test in 10 years. He believes he had a catheterization 10 years ago, 3 catheterizations since his bypass surgery 20 years ago.    He took himself off Crestor as he believes it was making his memory worse. doing well on simvastatin 40 mg daily with zetia 10 mg daily He quit smoking after his bypass surgery in 1992.      EKG shows normal sinus rhythm with rate 74 beats per minute, no significant ST or T wave changes.      Outpatient Encounter Prescriptions as of 12/21/2012  Medication Sig Dispense Refill  . aspirin 81 MG tablet Take 81 mg by mouth 2 (two) times daily.       . benazepril (LOTENSIN) 20 MG tablet Take 20 mg by mouth 2 (two) times daily.       . clopidogrel (PLAVIX) 75 MG tablet Take 75 mg by mouth daily.      . Coenzyme Q10 (CO Q-10) 100 MG CAPS Take 100 mg by mouth daily.      Marland Kitchen ezetimibe (ZETIA) 10 MG  tablet Take 1 tablet (10 mg total) by mouth daily.  90 tablet  3  . GARLIC PO Take 4,401 mg by mouth. Takes 7 tablets daily.      . hydrochlorothiazide (HYDRODIURIL) 12.5 MG tablet Take 1 tablet (12.5 mg total) by mouth daily.  30 tablet  6  . levothyroxine (SYNTHROID, LEVOTHROID) 25 MCG tablet Take 25 mcg by mouth daily.      . Multiple Vitamins-Minerals (MULTIVITAMINS THER. W/MINERALS) TABS Take 1 tablet by mouth daily.        . nitroGLYCERIN (NITROSTAT) 0.4 MG SL tablet Place 1 tablet (0.4 mg total) under the tongue every 5 (five) minutes as needed for chest pain.  25 tablet  11  . nortriptyline (PAMELOR) 25 MG capsule Take 75 mg by mouth at bedtime. For sleeping      . polyethylene glycol (MIRALAX / GLYCOLAX) packet Take 17 g by mouth daily.        . Ranitidine HCl (RANITIDINE 75 PO) Take 1 tablet by mouth daily.        . simvastatin (ZOCOR) 40 MG tablet Take 1 tablet (40 mg total) by mouth every evening.  90 tablet  3  . verapamil (COVERA HS) 240 MG (CO) 24 hr tablet Take 240 mg by mouth daily.        Marland Kitchen  Zn-Pyg Afri-Nettle-Saw Palmet (SAW PALMETTO COMPLEX PO) Take 2 tablets by mouth daily. Pt. States he takes 2 tablets (each of which are 1725 mg) daily.       Review of Systems  Constitutional: Negative.   HENT: Negative.   Eyes: Negative.   Respiratory: Negative.   Cardiovascular: Negative.   Gastrointestinal: Negative.   Musculoskeletal: Negative.   Skin: Negative.   Neurological: Negative.   Hematological: Negative.   Psychiatric/Behavioral: Negative.   All other systems reviewed and are negative.    BP 132/70  Pulse 74  Ht 5\' 9"  (1.753 m)  Wt 177 lb 4 oz (80.4 kg)  BMI 26.18 kg/m2  Physical Exam  Nursing note and vitals reviewed. Constitutional: He is oriented to person, place, and time. He appears well-developed and well-nourished.  HENT:  Head: Normocephalic.  Nose: Nose normal.  Mouth/Throat: Oropharynx is clear and moist.  Eyes: Conjunctivae normal are normal.  Pupils are equal, round, and reactive to light.  Neck: Normal range of motion. Neck supple. No JVD present. Carotid bruit is present.  Cardiovascular: Normal rate, regular rhythm, S1 normal, S2 normal and intact distal pulses.  Exam reveals no gallop and no friction rub.   Murmur heard.  Crescendo systolic murmur is present with a grade of 2/6  Pulmonary/Chest: Effort normal and breath sounds normal. No respiratory distress. He has no wheezes. He has no rales. He exhibits no tenderness.  Abdominal: Soft. Bowel sounds are normal. He exhibits no distension. There is no tenderness.  Musculoskeletal: Normal range of motion. He exhibits no edema and no tenderness.  Lymphadenopathy:    He has no cervical adenopathy.  Neurological: He is alert and oriented to person, place, and time. Coordination normal.  Skin: Skin is warm and dry. No rash noted. No erythema.  Psychiatric: He has a normal mood and affect. His behavior is normal. Judgment and thought content normal.           Assessment and Plan

## 2012-12-21 NOTE — Assessment & Plan Note (Signed)
Currently with no symptoms of angina. No further workup at this time. Continue current medication regimen. 

## 2012-12-28 ENCOUNTER — Other Ambulatory Visit: Payer: Self-pay | Admitting: *Deleted

## 2012-12-28 DIAGNOSIS — I739 Peripheral vascular disease, unspecified: Secondary | ICD-10-CM

## 2012-12-28 MED ORDER — CLOPIDOGREL BISULFATE 75 MG PO TABS: 75.0000 mg | ORAL_TABLET | Freq: Every day | ORAL | Status: DC

## 2013-01-14 ENCOUNTER — Other Ambulatory Visit: Payer: Self-pay | Admitting: *Deleted

## 2013-01-14 MED ORDER — EZETIMIBE 10 MG PO TABS: 10.0000 mg | ORAL_TABLET | Freq: Every day | ORAL | Status: DC

## 2013-01-14 MED ORDER — SIMVASTATIN 40 MG PO TABS: 40.0000 mg | ORAL_TABLET | Freq: Every evening | ORAL | Status: DC

## 2013-01-14 NOTE — Telephone Encounter (Signed)
90 DAY SUPPLY

## 2013-01-14 NOTE — Telephone Encounter (Signed)
90 day supply zetia and zocor.

## 2013-01-15 ENCOUNTER — Other Ambulatory Visit: Payer: Self-pay | Admitting: *Deleted

## 2013-01-15 DIAGNOSIS — I739 Peripheral vascular disease, unspecified: Secondary | ICD-10-CM

## 2013-01-15 DIAGNOSIS — Z48812 Encounter for surgical aftercare following surgery on the circulatory system: Secondary | ICD-10-CM

## 2013-01-15 DIAGNOSIS — I701 Atherosclerosis of renal artery: Secondary | ICD-10-CM

## 2013-01-21 ENCOUNTER — Other Ambulatory Visit: Payer: Medicare Other

## 2013-01-22 ENCOUNTER — Telehealth: Payer: Self-pay

## 2013-01-22 NOTE — Telephone Encounter (Signed)
LMTCB re:due for L/L

## 2013-01-25 NOTE — Telephone Encounter (Signed)
Wife informed Understanding verb Will have pt come for labs

## 2013-02-25 ENCOUNTER — Telehealth: Payer: Self-pay

## 2013-02-25 NOTE — Telephone Encounter (Signed)
ll

## 2013-02-26 ENCOUNTER — Ambulatory Visit (INDEPENDENT_AMBULATORY_CARE_PROVIDER_SITE_OTHER): Payer: Medicare Other

## 2013-02-26 DIAGNOSIS — E785 Hyperlipidemia, unspecified: Secondary | ICD-10-CM

## 2013-02-26 DIAGNOSIS — I2510 Atherosclerotic heart disease of native coronary artery without angina pectoris: Secondary | ICD-10-CM

## 2013-02-27 LAB — LIPID PANEL
Cholesterol, Total: 146 mg/dL (ref 100–199)
Triglycerides: 141 mg/dL (ref 0–149)

## 2013-02-28 ENCOUNTER — Encounter: Payer: Self-pay | Admitting: Cardiovascular Disease

## 2013-03-01 ENCOUNTER — Other Ambulatory Visit: Payer: Self-pay

## 2013-03-01 MED ORDER — BENAZEPRIL HCL 20 MG PO TABS: 20.0000 mg | ORAL_TABLET | Freq: Two times a day (BID) | ORAL | Status: DC

## 2013-03-01 NOTE — Telephone Encounter (Signed)
Refill sent for benazepril hcl 20 mg.

## 2013-03-29 ENCOUNTER — Telehealth: Payer: Self-pay

## 2013-03-29 NOTE — Telephone Encounter (Signed)
Spoke with pt wife and she mentioned that she would like for Dr. Mariah Milling to fill all of patients heart medications. Pt's wife is aware that we do not mind refilling the verapamil but normally whoever prescribes the medication is who is contacted for filling. She is aware that she would have to contact office if pcp is not able to fill. She mentioned that her husband does not currently need any refills at this time.

## 2013-03-29 NOTE — Telephone Encounter (Signed)
Pt wife called and wanted to have all his "heart medications" with Dr Mariah Milling. He currently gets Verapamil at PCP, and would like for Dr Mariah Milling to fill this. States he has enough for "about 3 weeks".

## 2013-04-06 ENCOUNTER — Ambulatory Visit: Payer: Medicare Other | Admitting: Neurosurgery

## 2013-04-06 ENCOUNTER — Other Ambulatory Visit: Payer: Medicare Other

## 2013-04-27 DIAGNOSIS — C44310 Basal cell carcinoma of skin of unspecified parts of face: Secondary | ICD-10-CM | POA: Insufficient documentation

## 2013-05-25 ENCOUNTER — Ambulatory Visit: Payer: Medicare Other | Admitting: Neurosurgery

## 2013-05-25 ENCOUNTER — Other Ambulatory Visit: Payer: Medicare Other

## 2013-07-05 ENCOUNTER — Other Ambulatory Visit: Payer: Self-pay | Admitting: *Deleted

## 2013-07-05 DIAGNOSIS — I701 Atherosclerosis of renal artery: Secondary | ICD-10-CM

## 2013-07-07 ENCOUNTER — Ambulatory Visit (INDEPENDENT_AMBULATORY_CARE_PROVIDER_SITE_OTHER): Payer: Medicare Other | Admitting: Cardiovascular Disease

## 2013-07-07 ENCOUNTER — Encounter: Payer: Self-pay | Admitting: Cardiovascular Disease

## 2013-07-07 VITALS — BP 140/78 | HR 69 | Ht 69.0 in | Wt 174.0 lb

## 2013-07-07 DIAGNOSIS — E785 Hyperlipidemia, unspecified: Secondary | ICD-10-CM

## 2013-07-07 DIAGNOSIS — I25810 Atherosclerosis of coronary artery bypass graft(s) without angina pectoris: Secondary | ICD-10-CM

## 2013-07-07 DIAGNOSIS — I6529 Occlusion and stenosis of unspecified carotid artery: Secondary | ICD-10-CM

## 2013-07-07 DIAGNOSIS — I10 Essential (primary) hypertension: Secondary | ICD-10-CM

## 2013-07-07 DIAGNOSIS — I739 Peripheral vascular disease, unspecified: Secondary | ICD-10-CM

## 2013-07-07 MED ORDER — EZETIMIBE-SIMVASTATIN 10-40 MG PO TABS: 1.0000 | ORAL_TABLET | Freq: Every day | ORAL | Status: DC

## 2013-07-07 NOTE — Assessment & Plan Note (Signed)
Currently with no symptoms of angina. No further workup at this time. Continue current medication regimen. 

## 2013-07-07 NOTE — Patient Instructions (Addendum)
You are doing well. Consider changing zetia and simvastatin to Vytorin 10/40 mg daily  Ask the pharmacy about vytorin 10/40 price   Please call us if you have new issues that need to be addressed before your next appt.  Your physician wants you to follow-up in: 6 months.  You will receive a reminder letter in the mail two months in advance. If you don't receive a letter, please call our office to schedule the follow-up appointment.

## 2013-07-07 NOTE — Progress Notes (Signed)
Patient ID: Clifford Aguilar, male    DOB: 05-04-1941, 72 y.o.   MRN: [id number]  HPI Comments: 72 years old with CAD, peripheral vascular disease,  bypass surgery in 1992,  carotid endarterectomy on the right with residual 60-70% disease on last ultrasound in 2013 , chronically occluded left carotid artery,  multiple surgical procedures on his lower extremity by Dr. Arbie Cookey, left iliac stent and left renal angioplasty with Dr. Myra Gianotti on 12/03/2011, who presents for routine followup S/p renal stent for severe HTN. He does have significant celiac and superior mesenteric arterial disease estimated at greater than 75%. ABIs 0.5 or less bilaterally April 2013.  Previous history of blurry vision episodes. He denies any recent vision problems. In general he feels well with no complaints. He has good energy, no chest pain or shortness of breath. He does have claudication with exertion. He is biking on a regular basis with no difficulty. He is trying to watch his weight, keep his sugars low.    He has not had a cardiac catheterization or stress test in 10 years. He believes he had a catheterization 10 years ago, 3 catheterizations since his bypass surgery 20 years ago.  He is doing well on simvastatin 40 mg daily with zetia 10 mg daily He quit smoking after his bypass surgery in 1992.      EKG shows normal sinus rhythm with rate 69 beats per minute, no significant ST or T wave changes.      Outpatient Encounter Prescriptions as of 07/07/2013  Medication Sig Dispense Refill  . aspirin 81 MG tablet Take 81 mg by mouth 2 (two) times daily.       . benazepril (LOTENSIN) 20 MG tablet Take 1 tablet (20 mg total) by mouth 2 (two) times daily.  60 tablet  6  . clopidogrel (PLAVIX) 75 MG tablet Take 1 tablet (75 mg total) by mouth daily.  30 tablet  6  . Coenzyme Q10 (CO Q-10) 100 MG CAPS Take 100 mg by mouth daily.      Marland Kitchen ezetimibe (ZETIA) 10 MG tablet Take 1 tablet (10 mg total) by mouth daily.  90 tablet  3   . GARLIC PO Take 4,098 mg by mouth. Takes 7 tablets daily.      . hydrochlorothiazide (HYDRODIURIL) 12.5 MG tablet Take 1 tablet (12.5 mg total) by mouth daily.  30 tablet  6  . levothyroxine (SYNTHROID, LEVOTHROID) 25 MCG tablet Take 25 mcg by mouth daily.      . nitroGLYCERIN (NITROSTAT) 0.4 MG SL tablet Place 1 tablet (0.4 mg total) under the tongue every 5 (five) minutes as needed for chest pain.  25 tablet  11  . nortriptyline (PAMELOR) 25 MG capsule Take 75 mg by mouth at bedtime. For sleeping      . polyethylene glycol (MIRALAX / GLYCOLAX) packet Take 17 g by mouth daily.        . Ranitidine HCl (RANITIDINE 75 PO) Take 1 tablet by mouth 2 (two) times daily.       . simvastatin (ZOCOR) 40 MG tablet Take 1 tablet (40 mg total) by mouth every evening.  90 tablet  3  . verapamil (COVERA HS) 240 MG (CO) 24 hr tablet Take 240 mg by mouth daily.        Marland Kitchen Zn-Pyg Afri-Nettle-Saw Palmet (SAW PALMETTO COMPLEX PO) Take 2 tablets by mouth daily. Pt. States he takes 2 tablets (each of which are 1725 mg) daily.  Review of Systems  Constitutional: Negative.   HENT: Negative.   Eyes: Negative.   Respiratory: Negative.   Cardiovascular: Negative.   Gastrointestinal: Negative.   Musculoskeletal: Negative.        Claudication pain with exertion of the legs  Skin: Negative.   Neurological: Negative.   Psychiatric/Behavioral: Negative.   All other systems reviewed and are negative.    BP 140/78  Pulse 69  Ht 5\' 9"  (1.753 m)  Wt 174 lb (78.926 kg)  BMI 25.68 kg/m2  Physical Exam  Nursing note and vitals reviewed. Constitutional: He is oriented to person, place, and time. He appears well-developed and well-nourished.  HENT:  Head: Normocephalic.  Nose: Nose normal.  Mouth/Throat: Oropharynx is clear and moist.  Eyes: Conjunctivae are normal. Pupils are equal, round, and reactive to light.  Neck: Normal range of motion. Neck supple. No JVD present. Carotid bruit is present.   Cardiovascular: Normal rate, regular rhythm, S1 normal, S2 normal and intact distal pulses.  Exam reveals no gallop and no friction rub.   Murmur heard.  Crescendo systolic murmur is present with a grade of 2/6  Pulmonary/Chest: Effort normal and breath sounds normal. No respiratory distress. He has no wheezes. He has no rales. He exhibits no tenderness.  Abdominal: Soft. Bowel sounds are normal. He exhibits no distension. There is no tenderness.  Musculoskeletal: Normal range of motion. He exhibits no edema and no tenderness.  Lymphadenopathy:    He has no cervical adenopathy.  Neurological: He is alert and oriented to person, place, and time. Coordination normal.  Skin: Skin is warm and dry. No rash noted. No erythema.  Psychiatric: He has a normal mood and affect. His behavior is normal. Judgment and thought content normal.      Assessment and Plan

## 2013-07-07 NOTE — Assessment & Plan Note (Signed)
Continues to have claudication type pain. Stable. He has followup with vascular surgery.

## 2013-07-07 NOTE — Assessment & Plan Note (Signed)
He will try Vytorin 10/40 mg daily. He already takes simvastatin 40 mg daily and zetia. He has a high co-pay on the zetia.

## 2013-07-07 NOTE — Assessment & Plan Note (Signed)
Blood pressure is well controlled on today's visit. No changes made to the medications. 

## 2013-07-07 NOTE — Assessment & Plan Note (Signed)
Significant disease on the right, occluded left carotid. We'll continue aggressive cholesterol management. He has followup with vascular surgery in [place].

## 2013-07-12 ENCOUNTER — Encounter: Payer: Self-pay | Admitting: Cardiovascular Disease

## 2013-07-23 ENCOUNTER — Encounter: Payer: Self-pay | Admitting: Cardiovascular Disease

## 2013-07-26 ENCOUNTER — Encounter: Payer: Self-pay | Admitting: Vascular Surgery

## 2013-07-27 ENCOUNTER — Other Ambulatory Visit (INDEPENDENT_AMBULATORY_CARE_PROVIDER_SITE_OTHER): Payer: Medicare Other | Admitting: *Deleted

## 2013-07-27 ENCOUNTER — Ambulatory Visit (INDEPENDENT_AMBULATORY_CARE_PROVIDER_SITE_OTHER): Payer: Medicare Other | Admitting: Vascular Surgery

## 2013-07-27 ENCOUNTER — Encounter (INDEPENDENT_AMBULATORY_CARE_PROVIDER_SITE_OTHER): Payer: Medicare Other | Admitting: *Deleted

## 2013-07-27 ENCOUNTER — Encounter: Payer: Self-pay | Admitting: Vascular Surgery

## 2013-07-27 VITALS — BP 126/56 | HR 58 | Resp 14 | Ht 69.0 in | Wt 174.0 lb

## 2013-07-27 DIAGNOSIS — I701 Atherosclerosis of renal artery: Secondary | ICD-10-CM | POA: Insufficient documentation

## 2013-07-27 DIAGNOSIS — Z48812 Encounter for surgical aftercare following surgery on the circulatory system: Secondary | ICD-10-CM

## 2013-07-27 DIAGNOSIS — I739 Peripheral vascular disease, unspecified: Secondary | ICD-10-CM

## 2013-07-27 DIAGNOSIS — I6529 Occlusion and stenosis of unspecified carotid artery: Secondary | ICD-10-CM

## 2013-07-27 NOTE — Progress Notes (Signed)
Patient has today for followup of his diffuse peripheral vascular occlusive disease. He has multiple issues. He is status post coronary bypass grafting in 1992 carotid endarterectomy and 93 femoral-popliteal bypass and 94 and multiple revisions since that time. He did undergo stenting to his left iliac and left renal artery and 2012 with Dr. Myra Gianotti. He actually looks quite good today and reports no new cardiac or peripheral vascular problems. He specifically denies any claudication and denies any transient ischemic attack or stroke. He is quite active walking without difficulty  Past Medical History  Diagnosis Date  . PVD (peripheral vascular disease)     s/p multiple revascularization procedures.  . Carotid artery occlusion     s/p right carotid endarterectomy  . Coronary artery disease     s/p coronary bypass graft surgery in 1992, stable  . Hyperlipidemia   . Hypertension   . Myocardial infarction   . Shortness of breath   . Chronic kidney disease     renal artery stenosis  . Cancer     basel cell carcinoma left ear  . GERD (gastroesophageal reflux disease)     History  Substance Use Topics  . Smoking status: Former Smoker    Types: Cigarettes    Quit date: 12/30/1990  . Smokeless tobacco: Former Neurosurgeon    Quit date: 01/10/1991     Comment: quit smoking in 1992  . Alcohol Use: 0.6 oz/week    1 Glasses of wine per week    Family History  Problem Relation Age of Onset  . Heart disease Other   . Stroke Mother   . Stroke Sister   . Stroke Brother     Allergies  Allergen Reactions  . Adhesive (Tape)     SKIN SENSITIVITY  . Clonidine Derivatives Other (See Comments)    Caused blindness in left eye for the days on the drug  . Lisinopril   . Isosorbide Mononitrate Rash    Current outpatient prescriptions:aspirin 81 MG tablet, Take 81 mg by mouth 2 (two) times daily. , Disp: , Rfl: ;  benazepril (LOTENSIN) 20 MG tablet, Take 1 tablet (20 mg total) by mouth 2 (two) times  daily., Disp: 60 tablet, Rfl: 6;  clopidogrel (PLAVIX) 75 MG tablet, Take 1 tablet (75 mg total) by mouth daily., Disp: 30 tablet, Rfl: 6;  Coenzyme Q10 (CO Q-10) 100 MG CAPS, Take 100 mg by mouth daily., Disp: , Rfl:  ezetimibe (ZETIA) 10 MG tablet, Take 1 tablet (10 mg total) by mouth daily., Disp: 90 tablet, Rfl: 3;  GARLIC PO, Take 1,000 mg by mouth. Takes 7 tablets daily., Disp: , Rfl: ;  hydrochlorothiazide (HYDRODIURIL) 12.5 MG tablet, Take 1 tablet (12.5 mg total) by mouth daily., Disp: 30 tablet, Rfl: 6;  levothyroxine (SYNTHROID, LEVOTHROID) 25 MCG tablet, Take 25 mcg by mouth daily., Disp: , Rfl:  nortriptyline (PAMELOR) 25 MG capsule, Take 75 mg by mouth at bedtime. For sleeping, Disp: , Rfl: ;  polyethylene glycol (MIRALAX / GLYCOLAX) packet, Take 17 g by mouth daily.  , Disp: , Rfl: ;  Ranitidine HCl (RANITIDINE 75 PO), Take 1 tablet by mouth 2 (two) times daily. , Disp: , Rfl: ;  simvastatin (ZOCOR) 40 MG tablet, Take 1 tablet (40 mg total) by mouth every evening., Disp: 90 tablet, Rfl: 3 verapamil (COVERA HS) 240 MG (CO) 24 hr tablet, Take 240 mg by mouth daily.  , Disp: , Rfl: ;  Zn-Pyg Afri-Nettle-Saw Palmet (SAW PALMETTO COMPLEX PO), Take 2 tablets by  mouth daily. Pt. States he takes 2 tablets (each of which are 1725 mg) daily., Disp: , Rfl: ;  ezetimibe-simvastatin (VYTORIN) 10-40 MG per tablet, Take 1 tablet by mouth at bedtime., Disp: 90 tablet, Rfl: 3 nitroGLYCERIN (NITROSTAT) 0.4 MG SL tablet, Place 1 tablet (0.4 mg total) under the tongue every 5 (five) minutes as needed for chest pain., Disp: 25 tablet, Rfl: 11  BP 126/56  Pulse 58  Resp 14  Ht 5\' 9"  (1.753 m)  Wt 174 lb (78.926 kg)  BMI 25.68 kg/m2  SpO2 98%  Body mass index is 25.68 kg/(m^2).       Physical exam: Well-developed well-nourished elderly appearing stated age in no acute distress Does have a left harsh carotid bruit with a known occlusion and ovary on the right Palpable femoral pulses bilaterally and  absent pedal pulses bilaterally. He does have palpable radial pulses bilaterally Abdomen soft nontender no masses noted Grossly intact neurologically.  Vascular lab studies revealed A1c of his left renal artery stent with no evidence of recurrent stenosis. There are elevated velocities in the right renal artery suggesting suitable proximal renal artery stenosis. Carotid duplex reveals 40-59% stenosis in his right internal carotid an occluded left internal carotid artery Ankle arm index stable of 0.51 on the right 0.53 on the left  Impression and plan: Diffuse peripheral vascular occlusive disease. I discussed this the duplex finding regarding these with the patient. I would not recommend any invasive further evaluation of his renal arteries since he is not having any accelerated hypertension or evidence of renal insufficiency. He will continue to see him on a yearly basis with ultrasound surveillance

## 2013-07-30 ENCOUNTER — Telehealth: Payer: Self-pay

## 2013-07-30 DIAGNOSIS — I10 Essential (primary) hypertension: Secondary | ICD-10-CM

## 2013-07-30 DIAGNOSIS — Z79899 Other long term (current) drug therapy: Secondary | ICD-10-CM

## 2013-07-30 DIAGNOSIS — I25810 Atherosclerosis of coronary artery bypass graft(s) without angina pectoris: Secondary | ICD-10-CM

## 2013-07-30 NOTE — Telephone Encounter (Signed)
Pt sent My Chart message recently,  had a blood test for Dr Toni Arthurs at [facility]. He was checking PSA and diabetes risk. He noted that potassium was slightly elevated at 5.5.  Pt is not on [facility] rx. Per pt none of his OTC medications have potassium in them. He just wanted to let you know. Dr Benancio Deeds will continue to monitor per pt. FYI

## 2013-08-02 NOTE — Telephone Encounter (Signed)
High potassium could be secondary to benazepril Would have primary care recheck in several weeks' time If still elevated, may need to change benazepril to alternate medication such as losartan 100 mg daily And recheck again in one month

## 2013-08-03 ENCOUNTER — Other Ambulatory Visit: Payer: Self-pay | Admitting: *Deleted

## 2013-08-03 DIAGNOSIS — I739 Peripheral vascular disease, unspecified: Secondary | ICD-10-CM

## 2013-08-03 DIAGNOSIS — I701 Atherosclerosis of renal artery: Secondary | ICD-10-CM

## 2013-08-03 DIAGNOSIS — Z48812 Encounter for surgical aftercare following surgery on the circulatory system: Secondary | ICD-10-CM

## 2013-08-03 NOTE — Telephone Encounter (Signed)
Called spoke with pt advised of Dr Windell Hummingbird recommendations.  Pt does not have appt to have BMP rechecked at primary care office at this time.  Pt would like to have lab drawn here and Dr Mariah Milling to review and adjust meds if needed.  Made lab appt for 08/10/13 at 9am.  Pt aware of appt date and time.

## 2013-08-04 ENCOUNTER — Other Ambulatory Visit: Payer: Self-pay

## 2013-08-10 ENCOUNTER — Ambulatory Visit (INDEPENDENT_AMBULATORY_CARE_PROVIDER_SITE_OTHER): Payer: Medicare Other

## 2013-08-10 DIAGNOSIS — I25810 Atherosclerosis of coronary artery bypass graft(s) without angina pectoris: Secondary | ICD-10-CM

## 2013-08-10 DIAGNOSIS — I10 Essential (primary) hypertension: Secondary | ICD-10-CM

## 2013-08-10 DIAGNOSIS — Z79899 Other long term (current) drug therapy: Secondary | ICD-10-CM

## 2013-08-11 LAB — BASIC METABOLIC PANEL
BUN/Creatinine Ratio: 21 (ref 10–22)
BUN: 22 mg/dL (ref 8–27)
CO2: 22 mmol/L (ref 18–29)
Creatinine, Ser: 1.06 mg/dL (ref 0.76–1.27)
GFR calc Af Amer: 82 mL/min/{1.73_m2} (ref >59)
GFR calc non Af Amer: 71 mL/min/{1.73_m2} (ref >59)

## 2013-09-06 ENCOUNTER — Other Ambulatory Visit: Payer: Self-pay | Admitting: *Deleted

## 2013-09-06 MED ORDER — HYDROCHLOROTHIAZIDE 12.5 MG PO TABS: 12.5000 mg | ORAL_TABLET | Freq: Every day | ORAL | Status: DC

## 2013-09-06 NOTE — Telephone Encounter (Signed)
Refilled Hydrochlorothiazide sent to [facility] pharmacy.

## 2013-09-24 ENCOUNTER — Encounter: Payer: Self-pay | Admitting: Cardiovascular Disease

## 2013-09-30 ENCOUNTER — Encounter: Payer: Self-pay | Admitting: Cardiovascular Disease

## 2013-10-05 ENCOUNTER — Encounter: Payer: Self-pay | Admitting: Cardiovascular Disease

## 2013-10-11 ENCOUNTER — Other Ambulatory Visit: Payer: Self-pay

## 2013-10-11 DIAGNOSIS — I10 Essential (primary) hypertension: Secondary | ICD-10-CM

## 2013-10-11 MED ORDER — HYDROCHLOROTHIAZIDE 25 MG PO TABS: 25.0000 mg | ORAL_TABLET | Freq: Every day | ORAL | Status: DC

## 2013-10-22 DIAGNOSIS — E291 Testicular hypofunction: Secondary | ICD-10-CM | POA: Insufficient documentation

## 2013-10-22 DIAGNOSIS — N138 Other obstructive and reflux uropathy: Secondary | ICD-10-CM | POA: Insufficient documentation

## 2013-10-22 DIAGNOSIS — N401 Enlarged prostate with lower urinary tract symptoms: Secondary | ICD-10-CM | POA: Insufficient documentation

## 2013-10-22 DIAGNOSIS — R6882 Decreased libido: Secondary | ICD-10-CM | POA: Insufficient documentation

## 2013-11-04 ENCOUNTER — Other Ambulatory Visit: Payer: Self-pay

## 2013-11-18 ENCOUNTER — Other Ambulatory Visit: Payer: Self-pay

## 2013-11-18 MED ORDER — BENAZEPRIL HCL 20 MG PO TABS: 20.0000 mg | ORAL_TABLET | Freq: Two times a day (BID) | ORAL | Status: DC

## 2013-11-18 NOTE — Telephone Encounter (Signed)
Refill sent for Benazepril HCL 20 mg take one tablet twice a day.

## 2014-01-03 ENCOUNTER — Ambulatory Visit (INDEPENDENT_AMBULATORY_CARE_PROVIDER_SITE_OTHER): Payer: Medicare PPO | Admitting: Cardiovascular Disease

## 2014-01-03 ENCOUNTER — Encounter: Payer: Self-pay | Admitting: Cardiovascular Disease

## 2014-01-03 VITALS — BP 174/83 | HR 71 | Ht 69.0 in | Wt 182.2 lb

## 2014-01-03 DIAGNOSIS — E785 Hyperlipidemia, unspecified: Secondary | ICD-10-CM

## 2014-01-03 DIAGNOSIS — R079 Chest pain, unspecified: Secondary | ICD-10-CM

## 2014-01-03 DIAGNOSIS — I25810 Atherosclerosis of coronary artery bypass graft(s) without angina pectoris: Secondary | ICD-10-CM

## 2014-01-03 DIAGNOSIS — R9431 Abnormal electrocardiogram [ECG] [EKG]: Secondary | ICD-10-CM

## 2014-01-03 DIAGNOSIS — I2510 Atherosclerotic heart disease of native coronary artery without angina pectoris: Secondary | ICD-10-CM

## 2014-01-03 DIAGNOSIS — I10 Essential (primary) hypertension: Secondary | ICD-10-CM

## 2014-01-03 NOTE — Assessment & Plan Note (Signed)
chronic mild shortness of breath, new EKG abnormalities. unable to treadmill secondary to claudication symptoms. We will order a pharmacologic Myoview to rule out ischemia. I suspect one of his bypass grafts is occluded

## 2014-01-03 NOTE — Assessment & Plan Note (Addendum)
Pressure elevated though he does report blood pressure typically in the 130 range at home. No changes made to the medications. Suggested he closely monitor his blood pressure

## 2014-01-03 NOTE — Progress Notes (Addendum)
Patient ID: Clifford Aguilar, male    DOB: Aug 03, 1941, 73 y.o.   MRN: [id number]  HPI Comments: 73 years old with CAD, peripheral vascular disease,  bypass surgery in 1992,  carotid endarterectomy on the right with residual 60-70% disease on last ultrasound in 2013 , chronically occluded left carotid artery,  multiple surgical procedures on his lower extremity by Dr. Donnetta Hutching, left iliac stent and left renal angioplasty with Dr. Trula Slade on 12/03/2011, who presents for routine followup S/p renal stent for severe HTN. He does have significant celiac and superior mesenteric arterial disease estimated at greater than 75%. ABIs 0.5 or less bilaterally April 2013.  Previous history of blurry vision episodes.  In general he feels well with no complaints. He has good energy, no chest pain or shortness of breath. He does have claudication with exertion. This has been a chronic issue, particularly of the right lower extremity . He is biking on a regular basis with no difficulty. More trouble with walking He is trying to watch his weight, keep his sugars low.    He has not had a cardiac catheterization or stress test in 10 years. He believes he had a catheterization 10 years ago, 3 catheterizations since his bypass surgery 20 years ago.  He is doing well on simvastatin 40 mg daily with zetia 10 mg daily He quit smoking after his bypass surgery in 1992.      EKG shows normal sinus rhythm with rate 71 beats per minute,   new ST abnormality in leads V3 through V6, lead 2, 3, aVF     Outpatient Encounter Prescriptions as of 01/03/2014  Medication Sig  . aspirin 81 MG tablet Take 81 mg by mouth 2 (two) times daily.   . benazepril (LOTENSIN) 20 MG tablet Take 1 tablet (20 mg total) by mouth 2 (two) times daily.  . clopidogrel (PLAVIX) 75 MG tablet Take 1 tablet (75 mg total) by mouth daily.  . Coenzyme Q10 (CO Q-10) 100 MG CAPS Take 100 mg by mouth daily.  Marland Kitchen ezetimibe (ZETIA) 10 MG tablet Take 1 tablet (10 mg  total) by mouth daily.  Marland Kitchen GARLIC PO Take 7,824 mg by mouth. Takes 7 tablets daily.  . hydrochlorothiazide (HYDRODIURIL) 25 MG tablet Take 1 tablet (25 mg total) by mouth daily.  . lansoprazole (PREVACID) 30 MG capsule Take 30 mg by mouth daily at 12 noon.  Marland Kitchen levothyroxine (SYNTHROID, LEVOTHROID) 25 MCG tablet Take 25 mcg by mouth daily.  . nitroGLYCERIN (NITROSTAT) 0.4 MG SL tablet Place 1 tablet (0.4 mg total) under the tongue every 5 (five) minutes as needed for chest pain.  . nortriptyline (PAMELOR) 25 MG capsule Take 75 mg by mouth at bedtime. For sleeping  . polyethylene glycol (MIRALAX / GLYCOLAX) packet Take 17 g by mouth daily.    . simvastatin (ZOCOR) 40 MG tablet Take 1 tablet (40 mg total) by mouth every evening.  . verapamil (COVERA HS) 240 MG (CO) 24 hr tablet Take 240 mg by mouth daily.    Marland Kitchen Zn-Pyg Afri-Nettle-Saw Palmet (SAW PALMETTO COMPLEX PO) Take 2 tablets by mouth daily. Pt. States he takes 2 tablets (each of which are 1725 mg) daily.    Review of Systems  Constitutional: Negative.   HENT: Negative.   Eyes: Negative.   Respiratory: Negative.   Cardiovascular: Negative.   Gastrointestinal: Negative.   Endocrine: Negative.   Musculoskeletal: Negative.        Claudication pain with exertion of the legs  Skin:  Negative.   Allergic/Immunologic: Negative.   Neurological: Negative.   Hematological: Negative.   Psychiatric/Behavioral: Negative.   All other systems reviewed and are negative.    BP 174/83  Pulse 71  Ht 5\' 9"  (1.753 m)  Wt 182 lb 4 oz (82.668 kg)  BMI 26.90 kg/m2 He reports blood pressure at home is typically in the 130 range Physical Exam  Nursing note and vitals reviewed. Constitutional: He is oriented to person, place, and time. He appears well-developed and well-nourished.  HENT:  Head: Normocephalic.  Nose: Nose normal.  Mouth/Throat: Oropharynx is clear and moist.  Eyes: Conjunctivae are normal. Pupils are equal, round, and reactive to  light.  Neck: Normal range of motion. Neck supple. No JVD present. Carotid bruit is present.  Cardiovascular: Normal rate, regular rhythm, S1 normal, S2 normal and intact distal pulses.  Exam reveals no gallop and no friction rub.   Murmur heard.  Crescendo systolic murmur is present with a grade of 2/6  Pulmonary/Chest: Effort normal and breath sounds normal. No respiratory distress. He has no wheezes. He has no rales. He exhibits no tenderness.  Abdominal: Soft. Bowel sounds are normal. He exhibits no distension. There is no tenderness.  Musculoskeletal: Normal range of motion. He exhibits no edema and no tenderness.  Lymphadenopathy:    He has no cervical adenopathy.  Neurological: He is alert and oriented to person, place, and time. Coordination normal.  Skin: Skin is warm and dry. No rash noted. No erythema.  Psychiatric: He has a normal mood and affect. His behavior is normal. Judgment and thought content normal.      Assessment and Plan

## 2014-01-03 NOTE — Patient Instructions (Addendum)
No medication changes were made. You have new ekg changes  We will schedule you for stress test, for new EKG changes No verapamil the morning before and the morning of the test No caffeine for 24 hours before the test  Please call us if you have new issues that need to be addressed before your next appt.  Your physician wants you to follow-up in: 2 weeks  [place] caregiver has ordered a Stress Test with nuclear imaging. The purpose of this test is to evaluate the blood supply to your heart muscle. This procedure is referred to as a "Non-Invasive Stress Test." This is because other than having an IV started in your vein, nothing is inserted or "invades" your body. Cardiac stress tests are done to find areas of poor blood flow to the heart by determining the extent of coronary artery disease (CAD). Some patients exercise on a treadmill, which naturally increases the blood flow to your heart, while others who are  unable to walk on a treadmill due to physical limitations have a pharmacologic/chemical stress agent called Lexiscan . This medicine will mimic walking on a treadmill by temporarily increasing your coronary blood flow.   Please note: these test may take anywhere between 2-4 hours to complete  PLEASE REPORT TO [place] AT THE FIRST DESK WILL DIRECT YOU WHERE TO GO  Date of Procedure:_________Tuesday, Jan 13____________  Arrival Time for Procedure:________7:00am______________  Instructions regarding medication:   __X__:  Hold verapamil night before procedure and morning of procedure   PLEASE NOTIFY THE OFFICE AT LEAST 24 HOURS IN ADVANCE IF YOU ARE UNABLE TO KEEP YOUR APPOINTMENT.  [phone] AND  PLEASE NOTIFY NUCLEAR MEDICINE AT [facility] AT LEAST 24 HOURS IN ADVANCE IF YOU ARE UNABLE TO KEEP YOUR APPOINTMENT. [phone]  How to prepare for your Myoview test:  1. Do not eat or drink after midnight 2. No caffeine for 24 hours prior  to test 3. No smoking 24 hours prior to test. 4. Your medication may be taken with water.  If your doctor stopped a medication because of this test, do not take that medication. 5. Ladies, please do not wear dresses.  Skirts or pants are appropriate. Please wear a short sleeve shirt. 6. No perfume, cologne or lotion. 7. Wear comfortable walking shoes. No heels!

## 2014-01-03 NOTE — Assessment & Plan Note (Signed)
Diffuse ST abnormality anterolateral leads, inferior leads. Concerning for graft occlusion

## 2014-01-03 NOTE — Assessment & Plan Note (Signed)
New EKG changes. We'll order a stress test. Unable to treadmill.

## 2014-01-04 ENCOUNTER — Ambulatory Visit (INDEPENDENT_AMBULATORY_CARE_PROVIDER_SITE_OTHER): Payer: Medicare PPO | Admitting: *Deleted

## 2014-01-04 DIAGNOSIS — E785 Hyperlipidemia, unspecified: Secondary | ICD-10-CM

## 2014-01-04 DIAGNOSIS — I2510 Atherosclerotic heart disease of native coronary artery without angina pectoris: Secondary | ICD-10-CM

## 2014-01-05 ENCOUNTER — Encounter: Payer: Self-pay | Admitting: Cardiovascular Disease

## 2014-01-05 LAB — BASIC METABOLIC PANEL
BUN/Creatinine Ratio: 22 (ref 10–22)
BUN: 25 mg/dL (ref 8–27)
CHLORIDE: 102 mmol/L (ref 97–108)
CO2: 21 mmol/L (ref 18–29)
Calcium: 9.2 mg/dL (ref 8.6–10.2)
Creatinine, Ser: 1.13 mg/dL (ref 0.76–1.27)
GFR calc non Af Amer: 65 mL/min/{1.73_m2} (ref >59)
GFR, EST AFRICAN AMERICAN: 76 mL/min/{1.73_m2} (ref >59)
GLUCOSE: 104 mg/dL — AB (ref 65–99)
POTASSIUM: 4.4 mmol/L (ref 3.5–5.2)
Sodium: 143 mmol/L (ref 134–144)

## 2014-01-05 LAB — LIPID PANEL
Chol/HDL Ratio: 4.4 ratio units (ref 0.0–5.0)
Cholesterol, Total: 166 mg/dL (ref 100–199)
HDL: 38 mg/dL — AB (ref >39)
LDL Calculated: 85 mg/dL (ref 0–99)
Triglycerides: 215 mg/dL — ABNORMAL HIGH (ref 0–149)
VLDL CHOLESTEROL CAL: 43 mg/dL — AB (ref 5–40)

## 2014-01-05 LAB — HEPATIC FUNCTION PANEL
ALK PHOS: 72 IU/L (ref 39–117)
ALT: 30 IU/L (ref 0–44)
AST: 25 IU/L (ref 0–40)
Albumin: 4.5 g/dL (ref 3.5–4.8)
Bilirubin, Direct: 0.15 mg/dL (ref 0.00–0.40)
Total Bilirubin: 0.5 mg/dL (ref 0.0–1.2)
Total Protein: 6.6 g/dL (ref 6.0–8.5)

## 2014-01-06 ENCOUNTER — Telehealth: Payer: Self-pay

## 2014-01-06 MED ORDER — CARVEDILOL 6.25 MG PO TABS: 6.2500 mg | ORAL_TABLET | Freq: Two times a day (BID) | ORAL | Status: DC

## 2014-01-06 MED ORDER — LANSOPRAZOLE 15 MG PO CPDR: 15.0000 mg | DELAYED_RELEASE_CAPSULE | Freq: Every day | ORAL | Status: AC

## 2014-01-06 NOTE — Telephone Encounter (Signed)
Spoke w/ pt's wife.  She is agreeable to plan, verbalizes understanding, and will make pt aware.

## 2014-01-06 NOTE — Telephone Encounter (Signed)
For high blood pressure would start carvedilol 6.25 mg twice a day This is also cardioprotective to help prevent heart attacks  Would hold off on heavy biking until stress test. Would be okay to do very light exercise, stopping exertion for any chest pain

## 2014-01-06 NOTE — Telephone Encounter (Signed)
Spoke w/ pt's wife who states that pt was exercising on his stationary bike this am (as he does everday) and experienced chest tightness. Spoke w/ pt who states that about 5 mins after starting exercise, he developed sharp shooting pain around his heart that was relieved immediately when he slowed down and stopped. Reports that pain "just shot thru and went away", that this was not a new occurrence for him and that it was not bad enough to take a nitro. Asked pt to come in for EKG/nurse visit, but he states that he is fine and feels good now. He would like to know if he should stop exercising until after his lexiscan on Tuesday. Advised pt that I would speak w/ Dr. Rockey Situ and call her back w/ his recommendation.   Pt's wife inquired about an email that she had sent regarding pt's BP being elevated.   Advised her and pt to call the office w/ any issues that might need immediate attention, they verbalize understanding.  Pt reports that BP has been in the "160-170 range over 50-60". Pt reports that his BP is elevated despite taking all of his meds today.    Also reports "his medications list is not correct. He is NOT taking Vytorin (too expensive compared to zetis/simvastatin combination). Also, he is on a 15mg  dosage of prevacid in the P.M." Advised her that I would update med list and make Dr. Rockey Situ aware.  Please advise.  Thank you!

## 2014-01-10 ENCOUNTER — Encounter: Payer: Self-pay | Admitting: Cardiovascular Disease

## 2014-01-10 ENCOUNTER — Telehealth: Payer: Self-pay

## 2014-01-10 MED ORDER — DOXAZOSIN MESYLATE 4 MG PO TABS: 4.0000 mg | ORAL_TABLET | Freq: Two times a day (BID) | ORAL | Status: DC

## 2014-01-10 NOTE — Telephone Encounter (Signed)
Pt wife called and states that pt is having a reaction to Carvedilol- blurred vision. Please call

## 2014-01-10 NOTE — Telephone Encounter (Signed)
Pt's wife, Humberto Seals, called stating that on Saturday pt had a type of reaction to carvedilol.  Reports that his "vision went blurry in his left eye" while he was driving so he pulled over and wife drove. Reports that this has happened before, when pt was on lisinopril and clonidine, same symptom, but resolved when medications were stopped.  Pt has not taken carvedilol since Sat morning.  Pt currently on verapamil.  BP has been running 153-185/63-70. Pt sched for Lexiscan tomorrow am. Would like to know if he needs another BB or wait for results of lexi.  Please advise.  Thank you!

## 2014-01-10 NOTE — Telephone Encounter (Signed)
Spoke w/ pt's wife regarding lab results.  Advised her that pt would need to hold any beta blockers before stress test, so will wait until after test tomorrow before making any changes to med regimen.

## 2014-01-11 ENCOUNTER — Telehealth: Payer: Self-pay

## 2014-01-11 NOTE — Telephone Encounter (Signed)
Received call from [facility] yesterday afternoon at 4:45pm that pt's lexiscan sched for 7:30 this am had not pre-certified through his ins. Pt was offered the option of signing a waiver for test, but he chose to cancel procedure and resched for it after pre-cert has been obtained to ensure that ins will cover cost.  Spoke w/ pt's wife.  She is agreeable to rescheduling test.  She does not have a specific date in mind and is agreeable to waiting until scheduling is open in order to resched this.  Will check to see if ins will cover this before rescheduling.

## 2014-01-12 ENCOUNTER — Observation Stay: Payer: Self-pay | Admitting: Internal Medicine

## 2014-01-12 ENCOUNTER — Telehealth: Payer: Self-pay

## 2014-01-12 DIAGNOSIS — R55 Syncope and collapse: Secondary | ICD-10-CM

## 2014-01-12 DIAGNOSIS — R079 Chest pain, unspecified: Secondary | ICD-10-CM

## 2014-01-12 LAB — CBC WITH DIFFERENTIAL/PLATELET
Basophil #: 0.1 10*3/uL (ref 0.0–0.1)
Basophil %: 1 %
Eosinophil #: 0.1 10*3/uL (ref 0.0–0.7)
Eosinophil %: 1.6 %
HCT: 39 % — ABNORMAL LOW (ref 40.0–52.0)
HGB: 13.6 g/dL (ref 13.0–18.0)
LYMPHS ABS: 1.7 10*3/uL (ref 1.0–3.6)
LYMPHS PCT: 20.8 %
MCH: 31.6 pg (ref 26.0–34.0)
MCHC: 34.9 g/dL (ref 32.0–36.0)
MCV: 90 fL (ref 80–100)
MONO ABS: 0.8 x10 3/mm (ref 0.2–1.0)
Monocyte %: 9.8 %
Neutrophil #: 5.6 10*3/uL (ref 1.4–6.5)
Neutrophil %: 66.8 %
PLATELETS: 184 10*3/uL (ref 150–440)
RBC: 4.32 10*6/uL — AB (ref 4.40–5.90)
RDW: 11.9 % (ref 11.5–14.5)
WBC: 8.4 10*3/uL (ref 3.8–10.6)

## 2014-01-12 LAB — BASIC METABOLIC PANEL
Anion Gap: 8 (ref 7–16)
BUN: 23 mg/dL — ABNORMAL HIGH (ref 7–18)
CREATININE: 1.37 mg/dL — AB (ref 0.60–1.30)
Calcium, Total: 8.9 mg/dL (ref 8.5–10.1)
Chloride: 103 mmol/L (ref 98–107)
Co2: 23 mmol/L (ref 21–32)
EGFR (African American): >60
GFR CALC NON AF AMER: 52 — AB
Glucose: 143 mg/dL — ABNORMAL HIGH (ref 65–99)
Osmolality: 274 (ref 275–301)
POTASSIUM: 4.4 mmol/L (ref 3.5–5.1)
Sodium: 134 mmol/L — ABNORMAL LOW (ref 136–145)

## 2014-01-12 LAB — URINALYSIS, COMPLETE
BILIRUBIN, UR: NEGATIVE
Bacteria: NONE SEEN
Blood: NEGATIVE
Glucose,UR: NEGATIVE mg/dL (ref 0–75)
Ketone: NEGATIVE
LEUKOCYTE ESTERASE: NEGATIVE
Nitrite: NEGATIVE
PROTEIN: NEGATIVE
Ph: 7 (ref 4.5–8.0)
RBC,UR: <1 /HPF (ref 0–5)
Specific Gravity: 1.012 (ref 1.003–1.030)
Squamous Epithelial: NONE SEEN

## 2014-01-12 LAB — TROPONIN I
Troponin-I: <0.02 ng/mL
Troponin-I: <0.02 ng/mL

## 2014-01-12 LAB — CK TOTAL AND CKMB (NOT AT ARMC)
CK, TOTAL: 87 U/L (ref 35–232)
CK-MB: 2.1 ng/mL (ref 0.5–3.6)

## 2014-01-12 NOTE — Telephone Encounter (Signed)
Spoke w/ pt's wife.  She reports that pt collapsed at the soup kitchen, did not lost consciousness, soup kitchen told her to come pick him up but she told them to call ambulance. They are en route to [facility] and will have [facility] called in for consult if needed.

## 2014-01-12 NOTE — Telephone Encounter (Signed)
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 States he denies any chest pain, sob. Pt is currently in route to [facility] via ambulance. She just wanted Dr. Rockey Situ to know.

## 2014-01-13 DIAGNOSIS — I200 Unstable angina: Secondary | ICD-10-CM

## 2014-01-13 DIAGNOSIS — R079 Chest pain, unspecified: Secondary | ICD-10-CM

## 2014-01-13 LAB — BASIC METABOLIC PANEL
ANION GAP: 4 — AB (ref 7–16)
BUN: 19 mg/dL — AB (ref 7–18)
CHLORIDE: 107 mmol/L (ref 98–107)
CO2: 25 mmol/L (ref 21–32)
CREATININE: 0.98 mg/dL (ref 0.60–1.30)
Calcium, Total: 8.7 mg/dL (ref 8.5–10.1)
EGFR (African American): >60
EGFR (Non-African Amer.): >60
GLUCOSE: 99 mg/dL (ref 65–99)
OSMOLALITY: 274 (ref 275–301)
POTASSIUM: 4.1 mmol/L (ref 3.5–5.1)
SODIUM: 136 mmol/L (ref 136–145)

## 2014-01-13 LAB — CBC WITH DIFFERENTIAL/PLATELET
BASOS ABS: 0 10*3/uL (ref 0.0–0.1)
BASOS PCT: 1 %
EOS PCT: 3.2 %
Eosinophil #: 0.2 10*3/uL (ref 0.0–0.7)
HCT: 34.5 % — ABNORMAL LOW (ref 40.0–52.0)
HGB: 12 g/dL — AB (ref 13.0–18.0)
Lymphocyte #: 1.3 10*3/uL (ref 1.0–3.6)
Lymphocyte %: 26.1 %
MCH: 31.3 pg (ref 26.0–34.0)
MCHC: 34.8 g/dL (ref 32.0–36.0)
MCV: 90 fL (ref 80–100)
MONO ABS: 0.6 x10 3/mm (ref 0.2–1.0)
MONOS PCT: 12.8 %
Neutrophil #: 2.9 10*3/uL (ref 1.4–6.5)
Neutrophil %: 56.9 %
PLATELETS: 148 10*3/uL — AB (ref 150–440)
RBC: 3.84 10*6/uL — ABNORMAL LOW (ref 4.40–5.90)
RDW: 12.2 % (ref 11.5–14.5)
WBC: 5 10*3/uL (ref 3.8–10.6)

## 2014-01-13 LAB — MAGNESIUM: Magnesium: 1.8 mg/dL

## 2014-01-14 ENCOUNTER — Telehealth: Payer: Self-pay | Admitting: *Deleted

## 2014-01-14 ENCOUNTER — Other Ambulatory Visit: Payer: Self-pay

## 2014-01-14 ENCOUNTER — Encounter: Payer: Self-pay | Admitting: Cardiovascular Disease

## 2014-01-14 MED ORDER — SIMVASTATIN 40 MG PO TABS: 40.0000 mg | ORAL_TABLET | Freq: Every evening | ORAL | Status: DC

## 2014-01-14 NOTE — Telephone Encounter (Signed)
Refill sent for simvastatin.  

## 2014-01-14 NOTE — Telephone Encounter (Signed)
Patient contacted regarding discharge from [facility] on 01/13/14.  Patient understands to follow up with provider Arida on 01/27/14 at 11:30am at [facility]. Patient understands discharge instructions? yes Patient understands medications and regiment? yes Patient understands to bring all medications to this visit? yes

## 2014-01-17 ENCOUNTER — Ambulatory Visit: Payer: Medicare PPO | Admitting: Cardiovascular Disease

## 2014-01-18 ENCOUNTER — Ambulatory Visit: Payer: Self-pay | Admitting: Family Medicine

## 2014-01-27 ENCOUNTER — Encounter: Payer: Self-pay | Admitting: Cardiovascular Disease

## 2014-01-27 ENCOUNTER — Ambulatory Visit (INDEPENDENT_AMBULATORY_CARE_PROVIDER_SITE_OTHER): Payer: Medicare PPO | Admitting: Cardiovascular Disease

## 2014-01-27 VITALS — BP 155/73 | HR 75 | Ht 69.0 in | Wt 183.0 lb

## 2014-01-27 DIAGNOSIS — I2510 Atherosclerotic heart disease of native coronary artery without angina pectoris: Secondary | ICD-10-CM

## 2014-01-27 DIAGNOSIS — I10 Essential (primary) hypertension: Secondary | ICD-10-CM

## 2014-01-27 DIAGNOSIS — R55 Syncope and collapse: Secondary | ICD-10-CM

## 2014-01-27 MED ORDER — HYDRALAZINE HCL 50 MG PO TABS: 50.0000 mg | ORAL_TABLET | Freq: Two times a day (BID) | ORAL | Status: DC

## 2014-01-27 MED ORDER — EZETIMIBE 10 MG PO TABS: 10.0000 mg | ORAL_TABLET | Freq: Every day | ORAL | Status: DC

## 2014-01-27 MED ORDER — HYDROCHLOROTHIAZIDE 25 MG PO TABS: 25.0000 mg | ORAL_TABLET | Freq: Every day | ORAL | Status: DC

## 2014-01-27 MED ORDER — SIMVASTATIN 40 MG PO TABS: 40.0000 mg | ORAL_TABLET | Freq: Every evening | ORAL | Status: DC

## 2014-01-27 NOTE — Progress Notes (Signed)
Patient ID: Clifford Aguilar, male    DOB: [birth year], 73 y.o.   MRN: [id number]  HPI Comments: 73 years old with CAD, peripheral vascular disease,  bypass surgery in 1992,  carotid endarterectomy on the right with residual 60-70% disease on last ultrasound in 2013 , chronically occluded left carotid artery,  multiple surgical procedures on his lower extremity by Dr. Donnetta Hutching, left iliac stent and left renal angioplasty with Dr. Trula Slade on 12/03/2011, who presents for a post hospital followup visit after he had syncope.  S/p renal stent for severe HTN. He does have significant celiac and superior mesenteric arterial disease estimated at greater than 75%. ABIs 0.5 or less bilaterally April 2013.  He was hospitalized on January 13 at [facility] after he had a syncopal episode which was felt to be due to hypotension related to treatment with Cardura in addition to taking sublingual nitroglycerin in the standing position. He ruled out for myocardial infarction. He underwent a nuclear stress test which showed no evidence of ischemia. Echocardiogram was also unremarkable. He was discharged home on hydralazine in addition to all her blood pressure medications. He has been doing reasonably well since hospital discharge.   Outpatient Encounter Prescriptions as of 01/03/2014  Medication Sig  . aspirin 81 MG tablet Take 81 mg by mouth 2 (two) times daily.   . benazepril (LOTENSIN) 20 MG tablet Take 1 tablet (20 mg total) by mouth 2 (two) times daily.  . clopidogrel (PLAVIX) 75 MG tablet Take 1 tablet (75 mg total) by mouth daily.  . Coenzyme Q10 (CO Q-10) 100 MG CAPS Take 100 mg by mouth daily.  Marland Kitchen ezetimibe (ZETIA) 10 MG tablet Take 1 tablet (10 mg total) by mouth daily.  Marland Kitchen GARLIC PO Take 3,154 mg by mouth. Takes 7 tablets daily.  . hydrochlorothiazide (HYDRODIURIL) 25 MG tablet Take 1 tablet (25 mg total) by mouth daily.  . lansoprazole (PREVACID) 30 MG capsule Take 30 mg by mouth daily at 12 noon.  Marland Kitchen levothyroxine  (SYNTHROID, LEVOTHROID) 25 MCG tablet Take 25 mcg by mouth daily.  . nitroGLYCERIN (NITROSTAT) 0.4 MG SL tablet Place 1 tablet (0.4 mg total) under the tongue every 5 (five) minutes as needed for chest pain.  . nortriptyline (PAMELOR) 25 MG capsule Take 75 mg by mouth at bedtime. For sleeping  . polyethylene glycol (MIRALAX / GLYCOLAX) packet Take 17 g by mouth daily.    . simvastatin (ZOCOR) 40 MG tablet Take 1 tablet (40 mg total) by mouth every evening.  . verapamil (COVERA HS) 240 MG (CO) 24 hr tablet Take 240 mg by mouth daily.    Marland Kitchen Zn-Pyg Afri-Nettle-Saw Palmet (SAW PALMETTO COMPLEX PO) Take 2 tablets by mouth daily. Pt. States he takes 2 tablets (each of which are 1725 mg) daily.    Review of Systems  Constitutional: Negative.   HENT: Negative.   Eyes: Negative.   Respiratory: Negative.   Cardiovascular: Negative.   Gastrointestinal: Negative.   Endocrine: Negative.   Musculoskeletal: Negative.        Claudication pain with exertion of the legs  Skin: Negative.   Allergic/Immunologic: Negative.   Neurological: Negative.   Hematological: Negative.   Psychiatric/Behavioral: Negative.   All other systems reviewed and are negative.    There were no vitals taken for this visit. He reports blood pressure at home is typically in the 130 range Physical Exam  Nursing note and vitals reviewed. Constitutional: He is oriented to person, place, and time. He appears well-developed and  well-nourished.  HENT:  Head: Normocephalic.  Nose: Nose normal.  Mouth/Throat: Oropharynx is clear and moist.  Eyes: Conjunctivae are normal. Pupils are equal, round, and reactive to light.  Neck: Normal range of motion. Neck supple. No JVD present. Carotid bruit is present.  Cardiovascular: Normal rate, regular rhythm, S1 normal, S2 normal and intact distal pulses.  Exam reveals no gallop and no friction rub.   Murmur heard.  Crescendo systolic murmur is present with a grade of 2/6  Pulmonary/Chest:  Effort normal and breath sounds normal. No respiratory distress. He has no wheezes. He has no rales. He exhibits no tenderness.  Abdominal: Soft. Bowel sounds are normal. He exhibits no distension. There is no tenderness.  Musculoskeletal: Normal range of motion. He exhibits no edema and no tenderness.  Lymphadenopathy:    He has no cervical adenopathy.  Neurological: He is alert and oriented to person, place, and time. Coordination normal.  Skin: Skin is warm and dry. No rash noted. No erythema.  Psychiatric: He has a normal mood and affect. His behavior is normal. Judgment and thought content normal.    EKG: Normal sinus rhythm  With inferior T wave changes suggestive of ischemia.   Assessment and Plan

## 2014-01-27 NOTE — Patient Instructions (Signed)
Your physician has recommended you make the following change in your medication:  Increase Hydralazine to 50 mg twice daily   Your physician recommends that you schedule a follow-up appointment in:  Dr. Rockey Situ in 3 months

## 2014-01-31 ENCOUNTER — Telehealth: Payer: Self-pay | Admitting: *Deleted

## 2014-01-31 NOTE — Telephone Encounter (Signed)
Spoke w/ pt.  He reports that his leg was breaking out and itching on the initial dose of hydralazine and this spread to both legs when the dosage was increased. He has stopped taking the hydralazine last night and sx have resolved this morning.  Pt reports that he is lethargic and "this mess has been going on for about 2 years". He would like to know if Dr. Fletcher Anon can recommend another medication.  BP this am 169/63. Please advise.  Thank you.

## 2014-01-31 NOTE — Telephone Encounter (Signed)
Patient called and he is breaking out on hydralaizne 25 and needs a medication change. Also patient is sob and very tired feeling. Please advise.

## 2014-02-01 ENCOUNTER — Encounter: Payer: Self-pay | Admitting: Cardiovascular Disease

## 2014-02-01 NOTE — Telephone Encounter (Signed)
Patient called to say his pressure has come down to 114/64 but still wants to know what Dr. Fletcher Anon wants him to do about his hydralazine.

## 2014-02-01 NOTE — Telephone Encounter (Signed)
Informed patient that per Dr. Fletcher Anon "Stop Hydralazine for now. Continue to monitor BP. Call if BP >150/90". Patient verbalized understanding.

## 2014-02-01 NOTE — Telephone Encounter (Signed)
Stop Hydralazine for now. Continue to monitor BP. Call if BP >150/90

## 2014-02-04 ENCOUNTER — Telehealth: Payer: Self-pay

## 2014-02-04 ENCOUNTER — Telehealth: Payer: Self-pay | Admitting: *Deleted

## 2014-02-04 NOTE — Telephone Encounter (Signed)
Patients blood pressure is averaging 155/63. It got up as high as 177/67. Patient denied feeling symptomatic.  He called because Dr. Fletcher Anon told him to call if bp was 150/90.

## 2014-02-04 NOTE — Telephone Encounter (Signed)
Error

## 2014-02-05 ENCOUNTER — Encounter: Payer: Self-pay | Admitting: Cardiovascular Disease

## 2014-02-05 DIAGNOSIS — R55 Syncope and collapse: Secondary | ICD-10-CM | POA: Insufficient documentation

## 2014-02-05 NOTE — Telephone Encounter (Signed)
He does have renal artery stenosis which might be contributing to this. I think he sees Dr. Donnetta Hutching for that and should discuss with him.

## 2014-02-05 NOTE — Assessment & Plan Note (Signed)
Was likely due to orthostatic hypotension after treatment with Cardura and taking sublingual nitroglycerin in the standing position. I instructed him not to take nitroglycerin if he is standing up. Cardiac workup was overall negative.

## 2014-02-05 NOTE — Assessment & Plan Note (Addendum)
Blood pressure is still elevated. Increase hydralazine to 50 mg twice daily.

## 2014-02-05 NOTE — Assessment & Plan Note (Signed)
Recent stress test showed no evidence of ischemia.

## 2014-02-07 ENCOUNTER — Telehealth: Payer: Self-pay | Admitting: Vascular Surgery

## 2014-02-07 NOTE — Telephone Encounter (Addendum)
Message copied by Gena Fray on Mon Feb 07, 2014  2:47 PM ------      Message from: Denman George      Created: Mon Feb 07, 2014 10:23 AM      Regarding: needs appt moved up with [facility]      Contact: [phone]       This pt's. wife, Elder Negus called.  She reported elevated BP's to Dr. Fletcher Anon on 02/04/14, and was told to schedule appt. with Dr. Donnetta Hutching, sooner than his 1 yr. f/u, due to renal artery stenosis.  Looks like he has several vasc. studies too; can we move his appt. up to within the next few weeks?   ------  02/07/14: spoke with Elder Negus to schedule for vascular labs on 02/27 at 9:00am and to see Dr Early on 03/03 at 3:00pm, dpm

## 2014-02-07 NOTE — Telephone Encounter (Signed)
Patient stated he will make appt with Dr. Donnetta Hutching.

## 2014-02-25 ENCOUNTER — Other Ambulatory Visit: Payer: Self-pay | Admitting: Vascular Surgery

## 2014-02-25 ENCOUNTER — Ambulatory Visit (HOSPITAL_COMMUNITY)
Admission: RE | Admit: 2014-02-25 | Discharge: 2014-02-25 | Disposition: A | Discharge status: Home / Self Care | Payer: Medicare HMO | Source: Ambulatory Visit | Attending: Vascular Surgery | Admitting: Vascular Surgery

## 2014-02-25 ENCOUNTER — Ambulatory Visit (INDEPENDENT_AMBULATORY_CARE_PROVIDER_SITE_OTHER)
Admission: RE | Admit: 2014-02-25 | Discharge: 2014-02-25 | Disposition: A | Discharge status: Home / Self Care | Payer: Medicare HMO | Source: Ambulatory Visit | Attending: Vascular Surgery | Admitting: Vascular Surgery

## 2014-02-25 DIAGNOSIS — I739 Peripheral vascular disease, unspecified: Secondary | ICD-10-CM

## 2014-02-25 DIAGNOSIS — I6529 Occlusion and stenosis of unspecified carotid artery: Secondary | ICD-10-CM

## 2014-02-25 DIAGNOSIS — Z48812 Encounter for surgical aftercare following surgery on the circulatory system: Secondary | ICD-10-CM

## 2014-02-25 DIAGNOSIS — Z09 Encounter for follow-up examination after completed treatment for conditions other than malignant neoplasm: Secondary | ICD-10-CM | POA: Insufficient documentation

## 2014-02-25 DIAGNOSIS — I701 Atherosclerosis of renal artery: Secondary | ICD-10-CM | POA: Insufficient documentation

## 2014-02-28 ENCOUNTER — Encounter: Payer: Self-pay | Admitting: Vascular Surgery

## 2014-03-01 ENCOUNTER — Ambulatory Visit (INDEPENDENT_AMBULATORY_CARE_PROVIDER_SITE_OTHER): Payer: Medicare PPO | Admitting: Vascular Surgery

## 2014-03-01 ENCOUNTER — Encounter: Payer: Self-pay | Admitting: Vascular Surgery

## 2014-03-01 VITALS — BP 143/64 | HR 70 | Resp 18 | Ht 69.0 in | Wt 184.0 lb

## 2014-03-01 DIAGNOSIS — I739 Peripheral vascular disease, unspecified: Secondary | ICD-10-CM

## 2014-03-01 NOTE — Progress Notes (Signed)
Here today for followup of his diffuse peripheral vascular occlusive disease. Most particularly concerned regarding recent difficult to control hypertension. He has a daily log with him and his blood pressure runs in the 170-180 D2 100 systolic range. He does have a known history of renal vascular occlusive disease having undergone renal stenting in the past. On our last visit with him approximately year ago he had no evidence of restenosis of the stent but did have a stenosis in his right renal artery. At that time he was not having any uncontrolled hypertension it was recommended that he continue with followup. Recent renal duplex suggests bilateral greater than 60% stenosis  Past Medical History  Diagnosis Date  . PVD (peripheral vascular disease)     s/p multiple revascularization procedures.  . Carotid artery occlusion     s/p right carotid endarterectomy  . Coronary artery disease     s/p coronary bypass graft surgery in 1992, stable  . Hyperlipidemia   . Hypertension   . Myocardial infarction   . Shortness of breath   . Chronic kidney disease     renal artery stenosis  . Cancer     basel cell carcinoma left ear  . GERD (gastroesophageal reflux disease)     History  Substance Use Topics  . Smoking status: Never Smoker   . Smokeless tobacco: Former User    Quit date: 01/10/1991     Comment: quit smoking in 1992  . Alcohol Use: 0.6 oz/week    1 Glasses of wine per week    Family History  Problem Relation Age of Onset  . Heart disease Other   . Stroke Mother   . Stroke Sister   . Stroke Brother     Allergies  Allergen Reactions  . Adhesive [Tape]     SKIN SENSITIVITY  . Cardura [Doxazosin]     Lost consciousness  . Carvedilol     Blurred visino  . Clonidine Derivatives Other (See Comments)    Caused blindness in left eye for the days on the drug  . Lisinopril   . Isosorbide Mononitrate Rash    Current outpatient prescriptions:aspirin 81 MG tablet, Take 81 mg by  mouth 2 (two) times daily. , Disp: , Rfl: ;  benazepril (LOTENSIN) 20 MG tablet, Take 1 tablet (20 mg total) by mouth 2 (two) times daily., Disp: 60 tablet, Rfl: 6;  clopidogrel (PLAVIX) 75 MG tablet, Take 1 tablet (75 mg total) by mouth daily., Disp: 30 tablet, Rfl: 6;  Coenzyme Q10 (CO Q-10) 100 MG CAPS, Take 100 mg by mouth daily., Disp: , Rfl:  ezetimibe (ZETIA) 10 MG tablet, Take 1 tablet (10 mg total) by mouth daily., Disp: 90 tablet, Rfl: 3;  GARLIC PO, Take 1,000 mg by mouth. Takes 7 tablets daily., Disp: , Rfl: ;  hydrALAZINE (APRESOLINE) 50 MG tablet, Take 1 tablet (50 mg total) by mouth 2 (two) times daily., Disp: 90 tablet, Rfl: 3;  hydrochlorothiazide (HYDRODIURIL) 25 MG tablet, Take 1 tablet (25 mg total) by mouth daily., Disp: 90 tablet, Rfl: 3 lansoprazole (PREVACID) 15 MG capsule, Take 1 capsule (15 mg total) by mouth daily at 12 noon., Disp: 30 capsule, Rfl: 6;  levothyroxine (SYNTHROID, LEVOTHROID) 25 MCG tablet, Take 25 mcg by mouth daily., Disp: , Rfl: ;  nortriptyline (PAMELOR) 25 MG capsule, Take 75 mg by mouth at bedtime. For sleeping, Disp: , Rfl: ;  polyethylene glycol (MIRALAX / GLYCOLAX) packet, Take 17 g by mouth daily.  , Disp: ,   Rfl:  simvastatin (ZOCOR) 40 MG tablet, Take 1 tablet (40 mg total) by mouth every evening., Disp: 90 tablet, Rfl: 3;  verapamil (COVERA HS) 240 MG (CO) 24 hr tablet, Take 240 mg by mouth daily.  , Disp: , Rfl: ;  Zn-Pyg Afri-Nettle-Saw Palmet (SAW PALMETTO COMPLEX PO), Take 1,725 mg by mouth daily. , Disp: , Rfl:  nitroGLYCERIN (NITROSTAT) 0.4 MG SL tablet, Place 1 tablet (0.4 mg total) under the tongue every 5 (five) minutes as needed for chest pain., Disp: 25 tablet, Rfl: 11  BP 143/64  Pulse 70  Resp 18  Ht 5\' 9"  (1.753 m)  Wt 184 lb (83.462 kg)  BMI 27.16 kg/m2  Body mass index is 27.16 kg/(m^2).       On physical exam a well-developed well-nourished gentleman in no acute distress Right carotid incision is well healed and he has no  bruits bilaterally 2+ radial pulses bilaterally Respirations equal nonlabored Abdomen soft no masses noted in no bruits appreciated 2+ right femoral pulse and 1+ left femoral pulse  Renal duplex suggests bilateral stenoses.  Had a long discussion with the patient. I explained that is more difficult to control hypertension I would recommend arteriography for further evaluation. I explained that he does indeed have critical stenosis of meters rest left or right kidney we would recommend intervention at the time of the procedure. He has had claudication since the 1980s with multiple prior revascularizations. We will obtain arterial studies to 70 of the lower extremities at the same setting. We will schedule this at his earliest convenience

## 2014-03-02 ENCOUNTER — Encounter: Payer: Self-pay | Admitting: Cardiovascular Disease

## 2014-03-02 MED ORDER — BENAZEPRIL HCL 20 MG PO TABS: 20.0000 mg | ORAL_TABLET | Freq: Two times a day (BID) | ORAL | Status: DC

## 2014-03-02 NOTE — Telephone Encounter (Signed)
New rx sent to [facility].

## 2014-03-03 ENCOUNTER — Encounter (HOSPITAL_COMMUNITY): Payer: Self-pay | Admitting: Pharmacy Technician

## 2014-03-04 ENCOUNTER — Other Ambulatory Visit: Payer: Self-pay

## 2014-03-11 ENCOUNTER — Encounter (HOSPITAL_COMMUNITY): Admission: RE | Payer: Self-pay | Source: Ambulatory Visit

## 2014-03-11 ENCOUNTER — Ambulatory Visit (HOSPITAL_COMMUNITY): Admission: RE | Admit: 2014-03-11 | Payer: Medicare PPO | Source: Ambulatory Visit | Admitting: Vascular Surgery

## 2014-03-11 SURGERY — ABDOMINAL AORTAGRAM
Anesthesia: LOCAL

## 2014-03-16 ENCOUNTER — Encounter (HOSPITAL_COMMUNITY)
Admission: RE | Disposition: A | Discharge status: Home / Self Care | Payer: Self-pay | Source: Ambulatory Visit | Attending: Vascular Surgery

## 2014-03-16 ENCOUNTER — Observation Stay (HOSPITAL_COMMUNITY)
Admission: RE | Admit: 2014-03-16 | Discharge: 2014-03-17 | Disposition: A | Discharge status: Home / Self Care | Payer: Medicare PPO | Source: Ambulatory Visit | Attending: Vascular Surgery | Admitting: Vascular Surgery

## 2014-03-16 DIAGNOSIS — I6529 Occlusion and stenosis of unspecified carotid artery: Secondary | ICD-10-CM | POA: Insufficient documentation

## 2014-03-16 DIAGNOSIS — K219 Gastro-esophageal reflux disease without esophagitis: Secondary | ICD-10-CM | POA: Insufficient documentation

## 2014-03-16 DIAGNOSIS — I739 Peripheral vascular disease, unspecified: Secondary | ICD-10-CM

## 2014-03-16 DIAGNOSIS — I951 Orthostatic hypotension: Secondary | ICD-10-CM | POA: Insufficient documentation

## 2014-03-16 DIAGNOSIS — Z8679 Personal history of other diseases of the circulatory system: Secondary | ICD-10-CM

## 2014-03-16 DIAGNOSIS — I2510 Atherosclerotic heart disease of native coronary artery without angina pectoris: Secondary | ICD-10-CM | POA: Insufficient documentation

## 2014-03-16 DIAGNOSIS — E785 Hyperlipidemia, unspecified: Secondary | ICD-10-CM | POA: Insufficient documentation

## 2014-03-16 DIAGNOSIS — I701 Atherosclerosis of renal artery: Secondary | ICD-10-CM

## 2014-03-16 DIAGNOSIS — Z85828 Personal history of other malignant neoplasm of skin: Secondary | ICD-10-CM | POA: Insufficient documentation

## 2014-03-16 DIAGNOSIS — I252 Old myocardial infarction: Secondary | ICD-10-CM | POA: Insufficient documentation

## 2014-03-16 HISTORY — PX: RENAL ANGIOGRAM: SHX5509

## 2014-03-16 HISTORY — PX: OTHER SURGICAL HISTORY: SHX169

## 2014-03-16 HISTORY — PX: ABDOMINAL ANGIOGRAM: SHX5499

## 2014-03-16 HISTORY — PX: RENAL ANGIOGRAM: SHX6061

## 2014-03-16 LAB — POCT I-STAT, CHEM 8
BUN: 26 mg/dL — ABNORMAL HIGH (ref 6–23)
Calcium, Ion: 1.27 mmol/L (ref 1.13–1.30)
Chloride: 103 mEq/L (ref 96–112)
Creatinine, Ser: 1.1 mg/dL (ref 0.50–1.35)
Glucose, Bld: 99 mg/dL (ref 70–99)
HEMATOCRIT: 45 % (ref 39.0–52.0)
HEMOGLOBIN: 15.3 g/dL (ref 13.0–17.0)
POTASSIUM: 4.2 meq/L (ref 3.7–5.3)
SODIUM: 141 meq/L (ref 137–147)
TCO2: 24 mmol/L (ref 0–100)

## 2014-03-16 LAB — POCT ACTIVATED CLOTTING TIME
ACTIVATED CLOTTING TIME: 166 s
ACTIVATED CLOTTING TIME: 182 s

## 2014-03-16 SURGERY — ABDOMINAL ANGIOGRAM
Anesthesia: LOCAL

## 2014-03-16 MED ORDER — SODIUM CHLORIDE 0.9 % IV SOLN: 1.0000 mL/kg/h | INTRAVENOUS | Status: DC

## 2014-03-16 MED ORDER — LIDOCAINE HCL (PF) 1 % IJ SOLN
INTRAMUSCULAR | Status: AC
  Filled 2014-03-16: qty 30

## 2014-03-16 MED ORDER — HEPARIN (PORCINE) IN NACL 2-0.9 UNIT/ML-% IJ SOLN
INTRAMUSCULAR | Status: AC
  Filled 2014-03-16: qty 1000

## 2014-03-16 MED ORDER — SODIUM CHLORIDE 0.9 % IV SOLN
INTRAVENOUS | Status: DC
  Administered 2014-03-16: via INTRAVENOUS

## 2014-03-16 MED ORDER — SODIUM CHLORIDE 0.9 % IV SOLN
INTRAVENOUS | Status: DC
  Administered 2014-03-16: 11:00:00 via INTRAVENOUS

## 2014-03-16 MED ORDER — MIDAZOLAM HCL 2 MG/2ML IJ SOLN
INTRAMUSCULAR | Status: AC
Start: 2014-03-16 — End: 2014-03-16
  Filled 2014-03-16: qty 2

## 2014-03-16 MED ORDER — HEPARIN SODIUM (PORCINE) 1000 UNIT/ML IJ SOLN
INTRAMUSCULAR | Status: AC
  Filled 2014-03-16: qty 1

## 2014-03-16 MED ORDER — MORPHINE SULFATE 2 MG/ML IJ SOLN: 2.0000 mg | INTRAMUSCULAR | Status: DC | PRN

## 2014-03-16 MED ORDER — FENTANYL CITRATE 0.05 MG/ML IJ SOLN
INTRAMUSCULAR | Status: AC
  Filled 2014-03-16: qty 2

## 2014-03-16 NOTE — Interval H&P Note (Signed)
History and Physical Interval Note:  03/16/2014 1:36 PM  Clifford Aguilar  has presented today for surgery, with the diagnosis of PVD/Chronic hypertension  The various methods of treatment have been discussed with the patient and family. After consideration of risks, benefits and other options for treatment, the patient has consented to  Procedure(s): ABDOMINAL ANGIOGRAM (N/A) RENAL ANGIOGRAM (N/A) as a surgical intervention .  The patient's history has been reviewed, patient examined, no change in status, stable for surgery.  I have reviewed the patient's chart and labs.  Questions were answered to the patient's satisfaction.     [name]

## 2014-03-16 NOTE — Progress Notes (Signed)
Report called to 2W charge nurse.  Pt kept informed of care and agrees that staying overnight is best.  Pt transferred to 2W without incident.  (R) (L) groins and (L) brachial sites intact and without bleeding.  (L) brachial armboard intact and ace wrap intact.  VSS when in bed lying or sitting.  Pt instructed not to get out of bed without staff assistance.

## 2014-03-16 NOTE — Op Note (Signed)
OPERATIVE REPORT  DATE OF SURGERY: 03/16/2014  PATIENT: Clifford Aguilar, 73 y.o. male MRN: [id number]  DOB: [birth year]  PRE-OPERATIVE DIAGNOSIS: Renal artery stenosis and lower surety arterial insufficiency  POST-OPERATIVE DIAGNOSIS:  Same  PROCEDURE: Aortogram with bilateral extremity runoff, right renal artery angioplasty and stenting  SURGEON:  Curt Jews, M.D.    ANESTHESIA:  1% lidocaine local and IV sedation  EBL: Minimal ml     BLOOD ADMINISTERED: None  DRAINS: None  SPECIMEN: None  COUNTS CORRECT:  YES  PLAN OF CARE: Holding area   PATIENT DISPOSITION:  PACU - hemodynamically stable  PROCEDURE DETAILS: The patient was taken to the professor cath I placed supine position where the area of both groins are prepped and draped in usual sterile fashion. Ultrasound was used to visualize. The patient had prior multiple groin surgical procedures. The patient did have a palpable femoral pulse on the left. SonoSite visualization was suboptimal and the external iliac above the inguinal ligament could easily be visualized in the common femoral below. There was extensive calcification. Several attempts at accessing this were unsuccessful. For this reason the right groin was accessed using local anesthesia an 18-gauge needle. The wire would not pass proximally. Continued to coil despite the several punctures of the graft in the groin. Scissors made to go for a brachial puncture. The ultrasound was used to visualize the brachial artery and had minimal calcification was of large caliber. The left arm was prepped in usual sterile fashion. Using local anesthesia the 18 needle easily puncture the brachial artery a micropuncture sheath was passed over this with a small wire. The_wire was in the past centrally and a 5 French sheath was passed over this. Pigtail catheter is positioned into the level of the descending thoracic aorta and then down to the level of the renal arteries. At AP  projection was undertaken. This showed a prior existing left renal artery stent which had no evidence of in-stent restenosis. There appeared to be a severe stenosis at the origin and proximal right renal artery. RAO and LAO projections were also undertaken to confirm this. Decision was made to intervene on the right renal artery. The [facility] catheter was removed and a long guiding catheter was positioned. Xeroform 4 shows and this was used to cannulate the right renal artery. The guide cath was passed beyond the origin. The_wire was exchanged for an 018 wire this was just out of the level of the branch renal arteries. The 5 French sheath and the arm was upsized to a 6 [place] sheath. The patient was given 5000 units of intravenous heparin. The circulating 18 mm length stent was chosen this was 5.5 mm diameter. This was positioned and multiple injections were used to through the guide sheath to confirm that this was just inside the aorta just beyond the origin of the renal artery. This was deployed to 11 atmospheres which dilated this to 5.2. Complete filling through the sheath showed good positioning and size. The balloon was then re\re dilated to 14 mm which brought the diameter 0.5 mm diameter. The guide sheath and small guidewire and balloon were removed and a pigtail catheter was repositioned into the pelvis. Aortogram and runoff was obtained. This showed patency of the distal aorta and iliac vessels. There was a prior existing patch in the right groin and a tight stenosis at the proximal portion of this which is what was preventing the wire from passing centrally. There was a complete occlusion of the distal  external and common femoral arteries on the left with marked lateral around this to refill the femoral artery runoff revealed complete occlusion of the superficial femoral arteries and its origin bilaterally. He did have large profundus with rich collateralization. On the left there was reconstitution of the  above-knee popliteal artery and three-vessel runoff. On the right there was reconstitution at the origin of the anterior tibial artery was entered through the artery the main runoff vessel to the foot. Patient tolerated the complication of a strictured holding area where the brachial sheath was pulled.   Curt Jews, M.D. 03/16/2014 4:34 PM

## 2014-03-16 NOTE — H&P (View-Only) (Signed)
Here today for followup of his diffuse peripheral vascular occlusive disease. Most particularly concerned regarding recent difficult to control hypertension. He has a daily log with him and his blood pressure runs in the 518-841 D2 660 systolic range. He does have a known history of renal vascular occlusive disease having undergone renal stenting in the past. On our last visit with him approximately year ago he had no evidence of restenosis of the stent but did have a stenosis in his right renal artery. At that time he was not having any uncontrolled hypertension it was recommended that he continue with followup. Recent renal duplex suggests bilateral greater than 60% stenosis  Past Medical History  Diagnosis Date  . PVD (peripheral vascular disease)     s/p multiple revascularization procedures.  . Carotid artery occlusion     s/p right carotid endarterectomy  . Coronary artery disease     s/p coronary bypass graft surgery in 1992, stable  . Hyperlipidemia   . Hypertension   . Myocardial infarction   . Shortness of breath   . Chronic kidney disease     renal artery stenosis  . Cancer     basel cell carcinoma left ear  . GERD (gastroesophageal reflux disease)     History  Substance Use Topics  . Smoking status: Never Smoker   . Smokeless tobacco: Former Systems developer    Quit date: 01/10/1991     Comment: quit smoking in 1992  . Alcohol Use: 0.6 oz/week    1 Glasses of wine per week    Family History  Problem Relation Age of Onset  . Heart disease Other   . Stroke Mother   . Stroke Sister   . Stroke Brother     Allergies  Allergen Reactions  . Adhesive [Tape]     SKIN SENSITIVITY  . Cardura [Doxazosin]     Lost consciousness  . Carvedilol     Blurred visino  . Clonidine Derivatives Other (See Comments)    Caused blindness in left eye for the days on the drug  . Lisinopril   . Isosorbide Mononitrate Rash    Current outpatient prescriptions:aspirin 81 MG tablet, Take 81 mg by  mouth 2 (two) times daily. , Disp: , Rfl: ;  benazepril (LOTENSIN) 20 MG tablet, Take 1 tablet (20 mg total) by mouth 2 (two) times daily., Disp: 60 tablet, Rfl: 6;  clopidogrel (PLAVIX) 75 MG tablet, Take 1 tablet (75 mg total) by mouth daily., Disp: 30 tablet, Rfl: 6;  Coenzyme Q10 (CO Q-10) 100 MG CAPS, Take 100 mg by mouth daily., Disp: , Rfl:  ezetimibe (ZETIA) 10 MG tablet, Take 1 tablet (10 mg total) by mouth daily., Disp: 90 tablet, Rfl: 3;  GARLIC PO, Take 6,301 mg by mouth. Takes 7 tablets daily., Disp: , Rfl: ;  hydrALAZINE (APRESOLINE) 50 MG tablet, Take 1 tablet (50 mg total) by mouth 2 (two) times daily., Disp: 90 tablet, Rfl: 3;  hydrochlorothiazide (HYDRODIURIL) 25 MG tablet, Take 1 tablet (25 mg total) by mouth daily., Disp: 90 tablet, Rfl: 3 lansoprazole (PREVACID) 15 MG capsule, Take 1 capsule (15 mg total) by mouth daily at 12 noon., Disp: 30 capsule, Rfl: 6;  levothyroxine (SYNTHROID, LEVOTHROID) 25 MCG tablet, Take 25 mcg by mouth daily., Disp: , Rfl: ;  nortriptyline (PAMELOR) 25 MG capsule, Take 75 mg by mouth at bedtime. For sleeping, Disp: , Rfl: ;  polyethylene glycol (MIRALAX / GLYCOLAX) packet, Take 17 g by mouth daily.  , Disp: ,  Rfl:  simvastatin (ZOCOR) 40 MG tablet, Take 1 tablet (40 mg total) by mouth every evening., Disp: 90 tablet, Rfl: 3;  verapamil (COVERA HS) 240 MG (CO) 24 hr tablet, Take 240 mg by mouth daily.  , Disp: , Rfl: ;  Zn-Pyg Afri-Nettle-Saw Palmet (SAW PALMETTO COMPLEX PO), Take 1,725 mg by mouth daily. , Disp: , Rfl:  nitroGLYCERIN (NITROSTAT) 0.4 MG SL tablet, Place 1 tablet (0.4 mg total) under the tongue every 5 (five) minutes as needed for chest pain., Disp: 25 tablet, Rfl: 11  BP 143/64  Pulse 70  Resp 18  Ht 5\' 9"  (1.753 m)  Wt 184 lb (83.462 kg)  BMI 27.16 kg/m2  Body mass index is 27.16 kg/(m^2).       On physical exam a well-developed well-nourished gentleman in no acute distress Right carotid incision is well healed and he has no  bruits bilaterally 2+ radial pulses bilaterally Respirations equal nonlabored Abdomen soft no masses noted in no bruits appreciated 2+ right femoral pulse and 1+ left femoral pulse  Renal duplex suggests bilateral stenoses.  Had a long discussion with the patient. I explained that is more difficult to control hypertension I would recommend arteriography for further evaluation. I explained that he does indeed have critical stenosis of meters rest left or right kidney we would recommend intervention at the time of the procedure. He has had claudication since the 1980s with multiple prior revascularizations. We will obtain arterial studies to 70 of the lower extremities at the same setting. We will schedule this at his earliest convenience

## 2014-03-16 NOTE — Progress Notes (Signed)
RECEIVED FROM CATH LAB VIA STRETCHER; LEFT ARM NOTED TO BE SWOLLEN AND FIRM; CHIP,DEBBIE AND RODNEY FROM CATH LAB HERE AND PAGED DR Kellie Simmering; DR LAWSON CALLED BACK AND SPOKE WITH RODNEY

## 2014-03-16 NOTE — Progress Notes (Signed)
Called to see patient in short stay about left brachial site post procedure.  Site appeared to be slightly more swollen than right arm but not tender or sore.  There was 1.25 inches difference between left arm and right arm and Dr. Kellie Simmering was made aware.  Dr. Kellie Simmering did ask that we place an ace bandage from the wrist to above the site before the patient goes home.  Informed Dr. Kellie Simmering that we would call him if there was any changes.

## 2014-03-16 NOTE — Progress Notes (Signed)
Pts orthostatic BPs are 17G systolic dispite rest, fluid and multiple attempts.  Pt states he does feel dizzy headed upon standing.  Dr Early notified and gave orders to admit pt overnight.  Bed control notified

## 2014-03-16 NOTE — Discharge Instructions (Signed)
Angiography, Care After °Refer to this sheet in the next few weeks. These instructions provide you with information on caring for yourself after your procedure. Your health care provider may also give you more specific instructions. Your treatment has been planned according to current medical practices, but problems sometimes occur. Call your health care provider if you have any problems or questions after your procedure.  °WHAT TO EXPECT AFTER THE PROCEDURE °After your procedure, it is typical to have the following sensations: °· Minor discomfort or tenderness and a small bump at the catheter insertion site. The bump should usually decrease in size and tenderness within 1 to 2 weeks. °· Any bruising will usually fade within 2 to 4 weeks. °HOME CARE INSTRUCTIONS  °· You may need to keep taking blood thinners if they were prescribed for you. Only take over-the-counter or prescription medicines for pain, fever, or discomfort as directed by your health care provider. °· Do not apply powder or lotion to the site. °· Do not sit in a bathtub, swimming pool, or whirlpool for 5 to 7 days. °· You may shower 24 hours after the procedure. Remove the bandage (dressing) and gently wash the site with plain soap and water. Gently pat the site dry. °· Inspect the site at least twice daily. °· Limit your activity for the first 48 hours. Do not bend, squat, or lift anything over 20 lb (9 kg) or as directed by your health care provider. °· Do not drive home if you are discharged the day of the procedure. Have someone else drive you. Follow instructions about when you can drive or return to work. °SEEK MEDICAL CARE IF: °· You get lightheaded when standing up. °· You have drainage (other than a small amount of blood on the dressing). °· You have chills. °· You have a fever. °· You have redness, warmth, swelling, or pain at the insertion site. °SEEK IMMEDIATE MEDICAL CARE IF:  °· You develop chest pain or shortness of breath, feel faint,  or pass out. °· You have bleeding, swelling larger than a walnut, or drainage from the catheter insertion site. °· You develop pain, discoloration, coldness, or severe bruising in the leg or arm that held the catheter. °· You develop bleeding from any other place, such as the bowels. You may see bright red blood in your urine or stools, or your stools may appear black and tarry. °· You have heavy bleeding from the site. If this happens, hold pressure on the site. °MAKE SURE YOU: °· Understand these instructions. °· Will watch your condition. °· Will get help right away if you are not doing well or get worse. °Document Released: 07/04/2005 Document Revised: 08/18/2013 Document Reviewed: 05/10/2013 °ExitCare® Patient Information ©2014 [employer]. ° °

## 2014-03-17 ENCOUNTER — Telehealth: Payer: Self-pay | Admitting: Vascular Surgery

## 2014-03-17 MED ORDER — LEVOTHYROXINE SODIUM 25 MCG PO TABS
25.0000 ug | ORAL_TABLET | Freq: Every day | ORAL | Status: DC
  Administered 2014-03-17: 25 ug via ORAL
  Filled 2014-03-17 (×2): qty 1

## 2014-03-17 MED ORDER — CLOPIDOGREL BISULFATE 75 MG PO TABS
75.0000 mg | ORAL_TABLET | Freq: Every day | ORAL | Status: DC
  Administered 2014-03-17: 75 mg via ORAL
  Filled 2014-03-17 (×2): qty 1

## 2014-03-17 MED ORDER — DEXTROSE 50 % IV SOLN
INTRAVENOUS | Status: AC
  Filled 2014-03-17: qty 50

## 2014-03-17 MED ORDER — NORTRIPTYLINE HCL 25 MG PO CAPS
75.0000 mg | ORAL_CAPSULE | Freq: Every day | ORAL | Status: DC
  Filled 2014-03-17 (×2): qty 3

## 2014-03-17 NOTE — Progress Notes (Signed)
Subjective: Interval History: none.. no complaints this morning. Was kept overnight for observation due to orthostatic hypotension. He was having blood pressure dropped into the 70s on standing. This has improved overnight and his pressure is stable this morning.  Objective: Vital signs in last 24 hours: Temp:  [97.6 F (36.4 C)-97.9 F (36.6 C)] 97.8 F (36.6 C) (03/19 0435) Pulse Rate:  [54-77] 54 (03/19 0435) Resp:  [18] 18 (03/18 1152) BP: (60-168)/(40-71) 138/52 mmHg (03/19 0435) SpO2:  [97 %-100 %] 100 % (03/19 0435) Weight:  [173 lb (78.472 kg)] 173 lb (78.472 kg) (03/18 1152)  Intake/Output from previous day:   Intake/Output this shift: Total I/O In: 560 [P.O.:560] Out: 0   Difficulty with groin puncture in his left brachial site that has mild bruising with no hematoma. He has 2+ left radial pulse.  Lab Results:  Recent Labs  03/16/14 1027  HGB 15.3  HCT 45.0   BMET  Recent Labs  03/16/14 1027  NA 141  K 4.2  CL 103  GLUCOSE 99  BUN 26*  CREATININE 1.10    Studies/Results: No results found. Anti-infectives: Anti-infectives   None      Assessment/Plan: s/p Procedure(s): ABDOMINAL ANGIOGRAM (N/A) RENAL ANGIOGRAM (N/A) Stable for discharge this morning. I will see him in the office in one month for continued followup   LOS: 1 day   Ressie Slevin 03/17/2014, 8:29 AM

## 2014-03-17 NOTE — Telephone Encounter (Addendum)
Message copied by Gena Fray on Thu Mar 17, 2014  3:35 PM ------      Message from: Peter Minium K      Created: Thu Mar 17, 2014 10:23 AM      Regarding: Schedule       Hinton Dyer, This man is post aortogram yesterday            ----- Message -----         From: Ulyses Amor, PA-C         Sent: 03/17/2014  10:04 AM           To: Vvs Charge Pool            F/U in the office 1 month with Dr. Donnetta Hutching       ------ 03/17/14: spoke with pts wife, dpm

## 2014-03-18 NOTE — Discharge Summary (Signed)
[facility] Discharge Summary   Patient ID:  Clifford Aguilar MRN: [id number] DOB/AGE: [birth year] 70 y.o.  Admit date: 03/16/2014 Discharge date: 03/18/2014 Date of Surgery: 03/16/2014 Surgeon: Surgeon(s): Rosetta Posner, MD  Admission Diagnosis: PVD/Chronic hypertension  Discharge Diagnoses:  PVD/Chronic hypertension  Secondary Diagnoses: Past Medical History  Diagnosis Date  . PVD (peripheral vascular disease)     s/p multiple revascularization procedures.  . Carotid artery occlusion     s/p right carotid endarterectomy  . Coronary artery disease     s/p coronary bypass graft surgery in 1992, stable  . Hyperlipidemia   . Hypertension   . Myocardial infarction   . Shortness of breath   . Chronic kidney disease     renal artery stenosis  . Cancer     basel cell carcinoma left ear  . GERD (gastroesophageal reflux disease)     Procedure(s): ABDOMINAL ANGIOGRAM RENAL ANGIOGRAM  Discharged Condition: good  HPI: [id number] concerned regarding recent difficult to control hypertension He does have a known history of renal vascular occlusive disease having undergone renal stenting in the past. On our last visit with him approximately year ago he had no evidence of restenosis of the stent but did have a stenosis in his right renal artery. At that time he was not having any uncontrolled hypertension it was recommended that he continue with followup. Recent renal duplex suggests bilateral greater than 60% stenosis.    On 03/16/2014 Dr. Donnetta Hutching performed Aortogram with bilateral extremity runoff, right renal artery angioplasty and stenting.  He was kept overnight for observation due to orthostatic hypotension. He was having blood pressure dropped into the 70s on standing. This has improved overnight and his pressure is stable this morning.  Stable for discharge this morning. I will see him in the office in one month for continued followup          Hospital  Course:  Clifford Aguilar is a 73 y.o. male is S/P Left brachial puncture.  Procedure(s): ABDOMINAL ANGIOGRAM RENAL ANGIOGRAM Extubated: POD # 0 Physical exam: left brachial site that has mild bruising with no hematoma. He has 2+ left radial pulse  Post-op wounds healing well Pt. Ambulating, voiding and taking PO diet without difficulty. Pt pain controlled with PO pain meds. Labs as below Complications:see HPI  Consults:     Significant Diagnostic Studies: CBC Lab Results  Component Value Date   WBC 7.1 12/04/2011   HGB 15.3 03/16/2014   HCT 45.0 03/16/2014   MCV 90.5 12/04/2011   PLT 150 12/04/2011    BMET    Component Value Date/Time   NA 141 03/16/2014 1027   NA 143 01/04/2014 0857   K 4.2 03/16/2014 1027   CL 103 03/16/2014 1027   CO2 21 01/04/2014 0857   GLUCOSE 99 03/16/2014 1027   GLUCOSE 104* 01/04/2014 0857   BUN 26* 03/16/2014 1027   BUN 25 01/04/2014 0857   CREATININE 1.10 03/16/2014 1027   CREATININE 1.06 11/10/2012 1438   CALCIUM 9.2 01/04/2014 0857   GFRNONAA 65 01/04/2014 0857   GFRAA 76 01/04/2014 0857   COAG No results found for this basename: INR, PROTIME     Disposition:  Discharge to :Home Discharge Orders   Future Appointments Provider Department Dept Phone   04/26/2014 10:15 AM Rosetta Posner, MD [facility] -[facility] [phone]   04/27/2014 10:30 AM Minna Merritts, MD [place] [phone]   08/02/2014 9:00 AM [facility] Us[facility]  CARDIOVASCULAR  [place]   Eat a light meal the night before the exam. Nothing to eat or drink for at least 8 hours before exam. No gum chewing or smoking the morning of the exam Please take your morning medications with small sips of water, especially blood pressure medication.*Very Important* Please wear 2 piece clothing IF the patient is to see the physician and does not take blood pressure medication, the blood pressure is often elevated and is being flagged in [facility]    08/02/2014 9:30 AM [facility] [place] [id number]   08/02/2014 10:00 AM [facility] [place] [phone]   08/02/2014 12:40 PM Sharmon Leyden Nickel, NP [facility] -[place] [phone]   Future Orders Complete By Expires   Call MD for:  redness, tenderness, or signs of infection (pain, swelling, bleeding, redness, odor or green/yellow discharge around incision site)  As directed    Call MD for:  severe or increased pain, loss or decreased feeling  in affected limb(s)  As directed    Call MD for:  temperature >100.5  As directed    Discharge patient  As directed    Comments:     Discharge pt to home   Increase activity slowly  As directed    Comments:     Walk with assistance use walker or cane as needed   May shower   As directed    Scheduling Instructions:     May shower 24 hours after procedure completion.   Resume previous diet  As directed        Medication List         aspirin 81 MG tablet  Take 81 mg by mouth 2 (two) times daily.     benazepril 20 MG tablet  Commonly known as:  LOTENSIN  Take 1 tablet (20 mg total) by mouth 2 (two) times daily.     clopidogrel 75 MG tablet  Commonly known as:  PLAVIX  Take 1 tablet (75 mg total) by mouth daily.     Co Q-10 100 MG Caps  Take 100 mg by mouth daily.     ezetimibe 10 MG tablet  Commonly known as:  ZETIA  Take 1 tablet (10 mg total) by mouth daily.     GARLIC PO  Take [id number] mg by mouth. Takes 7 tablets daily.     hydrochlorothiazide 25 MG tablet  Commonly known as:  HYDRODIURIL  Take 1 tablet (25 mg total) by mouth daily.     lansoprazole 15 MG capsule  Commonly known as:  PREVACID  Take 1 capsule (15 mg total) by mouth daily at 12 noon.     levothyroxine 25 MCG tablet  Commonly known as:  SYNTHROID, LEVOTHROID  Take 25 mcg by mouth daily.     nitroGLYCERIN 0.4 MG SL tablet  Commonly known as:  NITROSTAT  Place 1 tablet (0.4 mg  total) under the tongue every 5 (five) minutes as needed for chest pain.     nortriptyline 25 MG capsule  Commonly known as:  PAMELOR  Take 75 mg by mouth at bedtime. For sleeping     polyethylene glycol packet  Commonly known as:  MIRALAX / GLYCOLAX  Take 17 g by mouth daily.     SAW PALMETTO COMPLEX PO  Take 1,725 mg by mouth daily.     simvastatin 40 MG tablet  Commonly known as:  ZOCOR  Take 1 tablet (40 mg total)  by mouth every evening.     verapamil 240 MG (CO) 24 hr tablet  Commonly known as:  COVERA HS  Take 240 mg by mouth daily.       Verbal and written Discharge instructions given to the patient. Wound care per Discharge AVS     Follow-up Information   Follow up with EARLY, TODD, MD In 1 month. (message sent to office)    Specialty:  Vascular Surgery   Contact information:   [address] [place] [address] [phone]       Signed: Laurence Slate [facility] 03/18/2014, 12:40 PM

## 2014-03-22 ENCOUNTER — Encounter: Payer: Self-pay | Admitting: Cardiovascular Disease

## 2014-03-28 ENCOUNTER — Encounter: Payer: Self-pay | Admitting: Vascular Surgery

## 2014-03-28 ENCOUNTER — Other Ambulatory Visit: Payer: Self-pay | Admitting: *Deleted

## 2014-03-28 ENCOUNTER — Telehealth: Payer: Self-pay | Admitting: *Deleted

## 2014-03-28 DIAGNOSIS — M79602 Pain in left arm: Secondary | ICD-10-CM

## 2014-03-28 NOTE — Telephone Encounter (Signed)
Returning Mr. Vantassell's message about "knot on my arm."  Mr. Ureta states that he has a left arm "knot about the size of a 50 cent piece in the bend of my left arm that is hard and is painful."  Mr. Harker states that the left arm pain "goes from my shoulder down to my elbow and is sharp at times and achy at times."  Reviewed Mr. Mangel' symptoms and history with Dr. Kellie Simmering who advised bringing in Mr. Houdeshell tomorrow (03-29-2014)  for a UE Arterial Duplex and to see Dr. Donnetta Hutching.  Order for UE Arterial duplex entered in EPIC and scheduler Hinton Dyer) made appointments for tomorrow (03-29-2014)  at 1:30PM for UE Arterial duplex and 2:00PM appointment with Dr. Donnetta Hutching.  Attempted to reach Mr. Oki on his cell phone and on his phone home.  Left voice messages on Mr. Forinash's home and cell phone numbers informing him of need for and appointments made for 03-29-2014.  Asked that he call Hinton Dyer at [phone] to call and confirm these appointments.

## 2014-03-29 ENCOUNTER — Encounter: Payer: Self-pay | Admitting: Vascular Surgery

## 2014-03-29 ENCOUNTER — Ambulatory Visit (HOSPITAL_COMMUNITY)
Admission: RE | Admit: 2014-03-29 | Discharge: 2014-03-29 | Disposition: A | Discharge status: Home / Self Care | Payer: Medicare PPO | Source: Ambulatory Visit | Attending: Vascular Surgery | Admitting: Vascular Surgery

## 2014-03-29 ENCOUNTER — Ambulatory Visit (INDEPENDENT_AMBULATORY_CARE_PROVIDER_SITE_OTHER): Payer: Medicare PPO | Admitting: Vascular Surgery

## 2014-03-29 VITALS — BP 161/77 | HR 64 | Resp 18 | Ht 69.0 in | Wt 179.8 lb

## 2014-03-29 DIAGNOSIS — M79602 Pain in left arm: Secondary | ICD-10-CM

## 2014-03-29 DIAGNOSIS — I701 Atherosclerosis of renal artery: Secondary | ICD-10-CM

## 2014-03-29 DIAGNOSIS — M79609 Pain in unspecified limb: Secondary | ICD-10-CM

## 2014-03-29 DIAGNOSIS — I739 Peripheral vascular disease, unspecified: Secondary | ICD-10-CM

## 2014-03-29 DIAGNOSIS — Z48812 Encounter for surgical aftercare following surgery on the circulatory system: Secondary | ICD-10-CM

## 2014-03-29 NOTE — Addendum Note (Signed)
Addended by: Mena Goes on: 03/29/2014 05:06 PM   Modules accepted: Orders

## 2014-03-29 NOTE — Progress Notes (Signed)
Here today for followup of his recent right renal artery stenting via left brachial approach on 03/16/2014. This was done as an outpatient. He had attempts accessing both groins. He was found to have occlusion of his left external iliac and severe stenosis of his right. He then underwent easy access of his left brachial. Did have a tight stenosis in his right renal artery and was successfully treated with angioplasty and stenting. He caught yesterday reporting some discomfort in his left arm access site. He does have some diffuse bruising. He denies any neurologic deficits in his left hand or pain in his left hand. On physical exam he does have a 2+ radial pulse on the left and a small hematoma at the usual aspect both antecubital space.  Duplex today shows flow through the left brachial artery with a hematoma with a maximum diameter 1.5 cm with no evidence of compression.  He reports his pain is markedly improved since yesterday. I reassured him that this will continue to resolve as well as bruising. He will see [place] again as planned in several months for routine followup of his carotid disease and also bilateral renal duplex

## 2014-04-15 ENCOUNTER — Other Ambulatory Visit: Payer: Self-pay | Admitting: *Deleted

## 2014-04-15 DIAGNOSIS — I739 Peripheral vascular disease, unspecified: Secondary | ICD-10-CM

## 2014-04-15 MED ORDER — CLOPIDOGREL BISULFATE 75 MG PO TABS: 75.0000 mg | ORAL_TABLET | Freq: Every day | ORAL | Status: DC

## 2014-04-26 ENCOUNTER — Encounter: Payer: Medicare PPO | Admitting: Vascular Surgery

## 2014-04-27 ENCOUNTER — Ambulatory Visit (INDEPENDENT_AMBULATORY_CARE_PROVIDER_SITE_OTHER): Payer: Medicare PPO | Admitting: Cardiovascular Disease

## 2014-04-27 ENCOUNTER — Encounter: Payer: Self-pay | Admitting: Cardiovascular Disease

## 2014-04-27 VITALS — BP 158/78 | HR 67 | Ht 69.0 in | Wt 182.5 lb

## 2014-04-27 DIAGNOSIS — I701 Atherosclerosis of renal artery: Secondary | ICD-10-CM

## 2014-04-27 DIAGNOSIS — I2510 Atherosclerotic heart disease of native coronary artery without angina pectoris: Secondary | ICD-10-CM

## 2014-04-27 DIAGNOSIS — E785 Hyperlipidemia, unspecified: Secondary | ICD-10-CM | POA: Insufficient documentation

## 2014-04-27 DIAGNOSIS — I739 Peripheral vascular disease, unspecified: Secondary | ICD-10-CM

## 2014-04-27 DIAGNOSIS — I6529 Occlusion and stenosis of unspecified carotid artery: Secondary | ICD-10-CM

## 2014-04-27 DIAGNOSIS — I10 Essential (primary) hypertension: Secondary | ICD-10-CM

## 2014-04-27 DIAGNOSIS — I25810 Atherosclerosis of coronary artery bypass graft(s) without angina pectoris: Secondary | ICD-10-CM

## 2014-04-27 MED ORDER — DOXAZOSIN MESYLATE 4 MG PO TABS: 4.0000 mg | ORAL_TABLET | Freq: Every day | ORAL | Status: DC

## 2014-04-27 MED ORDER — CLOPIDOGREL BISULFATE 75 MG PO TABS: 75.0000 mg | ORAL_TABLET | Freq: Every day | ORAL | Status: DC

## 2014-04-27 NOTE — Progress Notes (Signed)
Patient ID: Clifford Aguilar, male    DOB: 1941/01/03, 73 y.o.   MRN: [id number]  HPI Comments: 73 years old with CAD, peripheral vascular disease,  bypass surgery in 1992,  carotid endarterectomy on the right with residual 60-70% disease on last ultrasound in 2013 , chronically occluded left carotid artery,  multiple surgical procedures on his lower extremity by Dr. Donnetta Hutching, left iliac stent and left renal angioplasty with Dr. Trula Slade on 12/03/2011, who presents for routine followup S/p renal stent for severe HTN. He does have significant celiac and superior mesenteric arterial disease estimated at greater than 75%. ABIs 0.5 or less bilaterally April 2013. He presents for routine followup  He reports that in January 2015, he developed symptoms of "head heaviness ", with discomfort in the back of his neck He took a NTG, and subsequently his BP dropped, he had syncope, family called 911, he was taken by emt to [facility] It was felt his hypotension related to treatment with Cardura in addition to taking sublingual nitroglycerin in the standing position. He ruled out for myocardial infarction. He underwent a nuclear stress test which showed no evidence of ischemia. Echocardiogram was also unremarkable. He was discharged home on hydralazine in addition to his other blood pressure medications. Since then he reports stopping hydralazine secondary to rash.  In fact he has numerous medications that have given him a rash   3/15 he was seen by Dr.  Donnetta Hutching Found to have renal artery stenosis that had progressed,  3/18 he had catheterization for renal artery stent placement Since then his blood pressure has continued to run high with no significant improvement   He has blood pressure numbers today with him with systolic pressures typically 150-170s a regular basis  Previous history of blurry vision episodes.    He has not had a cardiac catheterization or stress test in 11 years. He believes he had a catheterization  10 years ago, 3 catheterizations since his bypass surgery 20 years ago.  He is doing well on simvastatin 40 mg daily with zetia 10 mg daily He quit smoking after his bypass surgery in 1992.      EKG shows normal sinus rhythm with rate 61 beats per minute, No significant ST or T wave changes   Outpatient Encounter Prescriptions as of 04/27/2014  Medication Sig  . aspirin 81 MG tablet Take 81 mg by mouth 2 (two) times daily.   . benazepril (LOTENSIN) 20 MG tablet Take 1 tablet (20 mg total) by mouth 2 (two) times daily.  . clopidogrel (PLAVIX) 75 MG tablet Take 1 tablet (75 mg total) by mouth daily.  . Coenzyme Q10 (CO Q-10) 100 MG CAPS Take 100 mg by mouth daily.  Marland Kitchen ezetimibe (ZETIA) 10 MG tablet Take 1 tablet (10 mg total) by mouth daily.  Marland Kitchen GARLIC PO Take 4,034 mg by mouth. Takes 7 tablets daily.  . hydrochlorothiazide (HYDRODIURIL) 25 MG tablet Take 1 tablet (25 mg total) by mouth daily.  . lansoprazole (PREVACID) 15 MG capsule Take 1 capsule (15 mg total) by mouth daily at 12 noon.  Marland Kitchen levothyroxine (SYNTHROID, LEVOTHROID) 25 MCG tablet Take 25 mcg by mouth daily.  . nortriptyline (PAMELOR) 25 MG capsule Take 75 mg by mouth at bedtime. For sleeping  . polyethylene glycol (MIRALAX / GLYCOLAX) packet Take 17 g by mouth daily.    . simvastatin (ZOCOR) 40 MG tablet Take 1 tablet (40 mg total) by mouth every evening.  . verapamil (COVERA HS) 240 MG (CO)  24 hr tablet Take 240 mg by mouth daily.    Marland Kitchen Zn-Pyg Afri-Nettle-Saw Palmet (SAW PALMETTO COMPLEX PO) Take 1,725 mg by mouth daily.   . [DISCONTINUED] clopidogrel (PLAVIX) 75 MG tablet Take 1 tablet (75 mg total) by mouth daily.  Marland Kitchen doxazosin (CARDURA) 4 MG tablet Take 1 tablet (4 mg total) by mouth daily.  . nitroGLYCERIN (NITROSTAT) 0.4 MG SL tablet Place 1 tablet (0.4 mg total) under the tongue every 5 (five) minutes as needed for chest pain.    Review of Systems  Constitutional: Negative.   HENT: Negative.   Eyes: Negative.    Respiratory: Negative.   Cardiovascular: Negative.   Gastrointestinal: Negative.   Endocrine: Negative.   Musculoskeletal: Negative.        Claudication pain with exertion of the legs  Skin: Negative.   Allergic/Immunologic: Negative.   Neurological: Negative.   Hematological: Negative.   Psychiatric/Behavioral: Negative.   All other systems reviewed and are negative.   BP 142/62  Pulse 61  Ht 5\' 9"  (1.753 m)  Wt 182 lb 8 oz (82.781 kg)  BMI 26.94 kg/m2  Physical Exam  Nursing note and vitals reviewed. Constitutional: He is oriented to person, place, and time. He appears well-developed and well-nourished.  HENT:  Head: Normocephalic.  Nose: Nose normal.  Mouth/Throat: Oropharynx is clear and moist.  Eyes: Conjunctivae are normal. Pupils are equal, round, and reactive to light.  Neck: Normal range of motion. Neck supple. No JVD present. Carotid bruit is present.  Cardiovascular: Normal rate, regular rhythm, S1 normal, S2 normal and intact distal pulses.  Exam reveals no gallop and no friction rub.   Murmur heard.  Crescendo systolic murmur is present with a grade of 2/6  Pulmonary/Chest: Effort normal and breath sounds normal. No respiratory distress. He has no wheezes. He has no rales. He exhibits no tenderness.  Abdominal: Soft. Bowel sounds are normal. He exhibits no distension. There is no tenderness.  Musculoskeletal: Normal range of motion. He exhibits no edema and no tenderness.  Lymphadenopathy:    He has no cervical adenopathy.  Neurological: He is alert and oriented to person, place, and time. Coordination normal.  Skin: Skin is warm and dry. No rash noted. No erythema.  Psychiatric: He has a normal mood and affect. His behavior is normal. Judgment and thought content normal.      Assessment and Plan

## 2014-04-27 NOTE — Assessment & Plan Note (Signed)
Cholesterol still above goal. Recommended aggressive weight loss, exercise, dietary restriction. Goal LDL less than 70. We discussed with him changing to Lipitor 80 mg daily or adding zetia 10 mg daily. His wife would prefer to pursue aggressive diet, exercise over the next several months with recheck at the end of the summer

## 2014-04-27 NOTE — Assessment & Plan Note (Signed)
Severe carotid arterial disease, followed in [place]

## 2014-04-27 NOTE — Assessment & Plan Note (Signed)
Very few options for blood pressure control given his diffuse allergies to most blood pressure medications. Recent hypotensive event after taking nitroglycerin. He was on Cardura at the time. No allergies to Cardura. We will restart Cardura at 2 mg in the evening, slow titration up to 4 mg if tolerated. Suggested he laid down to take nitroglycerin. He previously took nitroglycerin for neck pain. This is not his usual anginal symptom which is left arm pain

## 2014-04-27 NOTE — Patient Instructions (Signed)
You are doing well. Please start 1/2 cardura at night Monitor your blood pressure Call if the BP is still elevated We would probably increase to a full pill at night  Please call us if you have new issues that need to be addressed before your next appt.  Your physician wants you to follow-up in: 2 months You will receive a reminder letter in the mail two months in advance. If you don't receive a letter, please call our office to schedule the follow-up appointment.

## 2014-04-27 NOTE — Assessment & Plan Note (Addendum)
S/p stent in [facility] 02/2014

## 2014-04-27 NOTE — Assessment & Plan Note (Signed)
Currently with no symptoms of angina. No further workup at this time. Continue current medication regimen. 

## 2014-05-24 ENCOUNTER — Telehealth: Payer: Self-pay

## 2014-05-24 NOTE — Telephone Encounter (Signed)
Pt sent e-mail via Contact us:   "Dr. Rockey Situ, you requested blood pressure readings after 2 weeks on 2 mg doxazosin in evening.  Here are the readings: 129/50; 146/54; 171/59; 162/63; 138/55; 157/58; 155/58; 151/57; 144/58; 149/59; 174/62; 140/56; 163/64; 141/53. Let us know what we need to do now. Thanks, Bruna Potter"

## 2014-05-25 ENCOUNTER — Telehealth: Payer: Self-pay

## 2014-05-25 NOTE — Telephone Encounter (Signed)
Would increase cardura/doxazosin up to 4 mg daily PM

## 2014-05-25 NOTE — Telephone Encounter (Signed)
Left message for pt to call back  °

## 2014-05-25 NOTE — Telephone Encounter (Signed)
Pt called and was just returning your call regarding his BP

## 2014-05-25 NOTE — Telephone Encounter (Signed)
Left message for pt that Dr. Donivan Scull response was sent via [place]. Asked pt to call back if he does not get that message or if he has any questions.

## 2014-05-25 NOTE — Telephone Encounter (Signed)
Sent pt message via MyChart

## 2014-06-19 ENCOUNTER — Encounter: Payer: Self-pay | Admitting: Cardiovascular Disease

## 2014-06-28 ENCOUNTER — Ambulatory Visit (INDEPENDENT_AMBULATORY_CARE_PROVIDER_SITE_OTHER): Payer: Medicare PPO | Admitting: Cardiovascular Disease

## 2014-06-28 ENCOUNTER — Encounter: Payer: Self-pay | Admitting: Cardiovascular Disease

## 2014-06-28 VITALS — BP 144/72 | HR 60 | Ht 69.0 in | Wt 181.0 lb

## 2014-06-28 DIAGNOSIS — I25810 Atherosclerosis of coronary artery bypass graft(s) without angina pectoris: Secondary | ICD-10-CM

## 2014-06-28 DIAGNOSIS — E785 Hyperlipidemia, unspecified: Secondary | ICD-10-CM

## 2014-06-28 DIAGNOSIS — I701 Atherosclerosis of renal artery: Secondary | ICD-10-CM

## 2014-06-28 DIAGNOSIS — I6529 Occlusion and stenosis of unspecified carotid artery: Secondary | ICD-10-CM

## 2014-06-28 NOTE — Assessment & Plan Note (Signed)
Followed by vascular in [place] 

## 2014-06-28 NOTE — Patient Instructions (Signed)
You are doing well. No medication changes were made.  Please call us if you have new issues that need to be addressed before your next appt.  Your physician wants you to follow-up in: 6 months.  You will receive a reminder letter in the mail two months in advance. If you don't receive a letter, please call our office to schedule the follow-up appointment.   

## 2014-06-28 NOTE — Progress Notes (Signed)
Patient ID: Clifford Aguilar, male    DOB: 1941/01/09, 73 y.o.   MRN: [id number]  HPI Comments: 73 years old with CAD, peripheral vascular disease,  bypass surgery in 1992,  carotid endarterectomy on the right with residual 60-70% disease on last ultrasound in 2013 , chronically occluded left carotid artery,  multiple surgical procedures on his lower extremity by Dr. Donnetta Hutching, left iliac stent and left renal angioplasty with Dr. Trula Slade on 12/03/2011, who presents for routine followup S/p renal stent for severe HTN. He does have significant celiac and superior mesenteric arterial disease estimated at greater than 75%. ABIs 0.5 or less bilaterally April 2013.  In followup today he reports that he is doing well. Rare episode of discomfort in the back of his neck, base of his skull, rare vision issues. Blood pressure is labile usually when in a range of 115 up to 150s. No lightheadedness or dizziness. He is otherwise active with no complaints. Spending much time working on his vegetable garden   in January 2015, he developed symptoms of "head heaviness ", with discomfort in the back of his neck He took a NTG, and subsequently his BP dropped, he had syncope, family called 911, he was taken by emt to [place] It was felt his hypotension related to treatment with Cardura in addition to taking sublingual nitroglycerin in the standing position. He ruled out for myocardial infarction. He underwent a nuclear stress test which showed no evidence of ischemia. Echocardiogram was also unremarkable. He was discharged home on hydralazine in addition to his other blood pressure medications. Since then he reports stopping hydralazine secondary to rash.  In fact he has numerous medications that have given him a rash   3/15 he was seen by Dr.  Donnetta Hutching Found to have renal artery stenosis that had progressed,  3/18 he had catheterization for renal artery stent placement Since then his blood pressure has continued to run high with  no significant improvement   He has blood pressure numbers today with him with systolic pressures typically 150-170s a regular basis  Previous history of blurry vision episodes.    He has not had a cardiac catheterization or stress test in 10 years. He believes he had a catheterization 10 years ago, 3 catheterizations since his bypass surgery 20 years ago.  He is doing well on simvastatin 40 mg daily with zetia 10 mg daily He quit smoking after his bypass surgery in 1992.      EKG shows normal sinus rhythm with  No significant ST or T wave changes   Outpatient Encounter Prescriptions as of 06/28/2014  Medication Sig  . aspirin 81 MG tablet Take 81 mg by mouth 2 (two) times daily.   . benazepril (LOTENSIN) 20 MG tablet Take 1 tablet (20 mg total) by mouth 2 (two) times daily.  . clopidogrel (PLAVIX) 75 MG tablet Take 1 tablet (75 mg total) by mouth daily.  . Coenzyme Q10 (CO Q-10) 100 MG CAPS Take 100 mg by mouth daily.  Marland Kitchen doxazosin (CARDURA) 4 MG tablet Take 1 tablet (4 mg total) by mouth daily.  Marland Kitchen ezetimibe (ZETIA) 10 MG tablet Take 1 tablet (10 mg total) by mouth daily.  Marland Kitchen GARLIC PO Take 2,703 mg by mouth. Takes 7 tablets daily.  . hydrochlorothiazide (HYDRODIURIL) 25 MG tablet Take 1 tablet (25 mg total) by mouth daily.  . lansoprazole (PREVACID) 15 MG capsule Take 1 capsule (15 mg total) by mouth daily at 12 noon.  Marland Kitchen levothyroxine (SYNTHROID, LEVOTHROID)  25 MCG tablet Take 25 mcg by mouth daily.  . nitroGLYCERIN (NITROSTAT) 0.4 MG SL tablet Place 1 tablet (0.4 mg total) under the tongue every 5 (five) minutes as needed for chest pain.  . nortriptyline (PAMELOR) 25 MG capsule Take 75 mg by mouth at bedtime. For sleeping  . polyethylene glycol (MIRALAX / GLYCOLAX) packet Take 17 g by mouth daily.    . simvastatin (ZOCOR) 40 MG tablet Take 1 tablet (40 mg total) by mouth every evening.  . verapamil (COVERA HS) 240 MG (CO) 24 hr tablet Take 240 mg by mouth daily.    Marland Kitchen Zn-Pyg  Afri-Nettle-Saw Palmet (SAW PALMETTO COMPLEX PO) Take 1,725 mg by mouth daily.     Review of Systems  Constitutional: Negative.   HENT: Negative.   Eyes: Negative.   Respiratory: Negative.   Cardiovascular: Negative.   Gastrointestinal: Negative.   Endocrine: Negative.   Musculoskeletal: Negative.        Claudication pain with exertion of the legs  Skin: Negative.   Allergic/Immunologic: Negative.   Neurological: Negative.   Hematological: Negative.   Psychiatric/Behavioral: Negative.   All other systems reviewed and are negative.   BP 144/72  Pulse 60  Ht 5\' 9"  (1.753 m)  Wt 181 lb (82.101 kg)  BMI 26.72 kg/m2  Physical Exam  Nursing note and vitals reviewed. Constitutional: He is oriented to person, place, and time. He appears well-developed and well-nourished.  HENT:  Head: Normocephalic.  Nose: Nose normal.  Mouth/Throat: Oropharynx is clear and moist.  Eyes: Conjunctivae are normal. Pupils are equal, round, and reactive to light.  Neck: Normal range of motion. Neck supple. No JVD present. Carotid bruit is present.  Cardiovascular: Normal rate, regular rhythm, S1 normal, S2 normal and intact distal pulses.  Exam reveals no gallop and no friction rub.   Murmur heard.  Crescendo systolic murmur is present with a grade of 2/6  Pulmonary/Chest: Effort normal and breath sounds normal. No respiratory distress. He has no wheezes. He has no rales. He exhibits no tenderness.  Abdominal: Soft. Bowel sounds are normal. He exhibits no distension. There is no tenderness.  Musculoskeletal: Normal range of motion. He exhibits no edema and no tenderness.  Lymphadenopathy:    He has no cervical adenopathy.  Neurological: He is alert and oriented to person, place, and time. Coordination normal.  Skin: Skin is warm and dry. No rash noted. No erythema.  Psychiatric: He has a normal mood and affect. His behavior is normal. Judgment and thought content normal.      Assessment and  Plan

## 2014-06-28 NOTE — Assessment & Plan Note (Signed)
Prior renal artery stent. Blood pressure stable. Followup in [place]

## 2014-06-28 NOTE — Assessment & Plan Note (Signed)
Encouraged him to work on his diet and exercise in an effort to achieve LDL less than 70

## 2014-06-28 NOTE — Assessment & Plan Note (Signed)
Currently with no symptoms of angina. No further workup at this time. Continue current medication regimen. 

## 2014-08-02 ENCOUNTER — Other Ambulatory Visit (HOSPITAL_COMMUNITY): Payer: Medicare PPO

## 2014-08-02 ENCOUNTER — Other Ambulatory Visit (HOSPITAL_COMMUNITY): Payer: Medicare Other

## 2014-08-02 ENCOUNTER — Encounter (HOSPITAL_COMMUNITY): Payer: Medicare Other

## 2014-08-02 ENCOUNTER — Encounter (HOSPITAL_COMMUNITY): Payer: Medicare PPO

## 2014-08-02 ENCOUNTER — Ambulatory Visit: Payer: Medicare PPO | Admitting: Family

## 2014-08-02 ENCOUNTER — Ambulatory Visit: Payer: Medicare Other | Admitting: Vascular Surgery

## 2014-08-26 ENCOUNTER — Ambulatory Visit (INDEPENDENT_AMBULATORY_CARE_PROVIDER_SITE_OTHER): Payer: Commercial Managed Care - HMO | Admitting: Podiatry

## 2014-08-26 ENCOUNTER — Encounter: Payer: Self-pay | Admitting: Podiatry

## 2014-08-26 VITALS — BP 107/47 | HR 92 | Resp 16 | Ht 69.0 in | Wt 175.0 lb

## 2014-08-26 DIAGNOSIS — L84 Corns and callosities: Secondary | ICD-10-CM

## 2014-08-26 DIAGNOSIS — M779 Enthesopathy, unspecified: Secondary | ICD-10-CM

## 2014-08-26 DIAGNOSIS — M216X9 Other acquired deformities of unspecified foot: Secondary | ICD-10-CM

## 2014-08-26 MED ORDER — TRIAMCINOLONE ACETONIDE 10 MG/ML IJ SUSP
10.0000 mg | Freq: Once | INTRAMUSCULAR | Status: AC
  Administered 2014-08-26: 10 mg

## 2014-08-26 NOTE — Progress Notes (Signed)
Subjective:     Patient ID: Clifford Aguilar, male   DOB: 1941-07-01, 73 y.o.   MRN: [id number]  HPI patient points the plantar aspect right fifth metatarsal stating it's been inflamed and sore and he is supposed to be active do to vascular disease and his legs   Review of Systems     Objective:   Physical Exam Neurovascular status was diminished with diminished circulatory status DP and PT and some signs of claudication but minimal in his lower legs. Patient's right fifth MPJ is inflamed with keratotic lesion formation that is painful when pressed    Assessment:     Capsulitis right fifth MPJ with keratotic lesion that also may be do to some blood flow loss    Plan:     H&P reviewed and condition explained. Today I did a careful capsular injection due to inflammation 3 mg dexamethasone Kenalog 5 mg Xylocaine and did deep debridement of lesion and reappoint. Discussed possibility for a cushion orthotic to replace the orthotics that he is currently wearing

## 2014-09-17 ENCOUNTER — Encounter: Payer: Self-pay | Admitting: Cardiovascular Disease

## 2014-09-19 ENCOUNTER — Encounter: Payer: Self-pay | Admitting: Vascular Surgery

## 2014-09-20 ENCOUNTER — Ambulatory Visit (INDEPENDENT_AMBULATORY_CARE_PROVIDER_SITE_OTHER)
Admission: RE | Admit: 2014-09-20 | Discharge: 2014-09-20 | Disposition: A | Discharge status: Home / Self Care | Payer: Medicare HMO | Source: Ambulatory Visit | Attending: Vascular Surgery | Admitting: Vascular Surgery

## 2014-09-20 ENCOUNTER — Ambulatory Visit (HOSPITAL_COMMUNITY)
Admission: RE | Admit: 2014-09-20 | Discharge: 2014-09-20 | Disposition: A | Discharge status: Home / Self Care | Payer: Medicare HMO | Source: Ambulatory Visit | Attending: Vascular Surgery | Admitting: Vascular Surgery

## 2014-09-20 ENCOUNTER — Other Ambulatory Visit: Payer: Self-pay | Admitting: Vascular Surgery

## 2014-09-20 ENCOUNTER — Encounter: Payer: Self-pay | Admitting: Vascular Surgery

## 2014-09-20 ENCOUNTER — Ambulatory Visit (INDEPENDENT_AMBULATORY_CARE_PROVIDER_SITE_OTHER): Payer: Commercial Managed Care - HMO | Admitting: Vascular Surgery

## 2014-09-20 VITALS — BP 149/66 | HR 62 | Resp 18 | Ht 69.0 in | Wt 181.9 lb

## 2014-09-20 DIAGNOSIS — I739 Peripheral vascular disease, unspecified: Secondary | ICD-10-CM

## 2014-09-20 DIAGNOSIS — Z48812 Encounter for surgical aftercare following surgery on the circulatory system: Secondary | ICD-10-CM

## 2014-09-20 DIAGNOSIS — I701 Atherosclerosis of renal artery: Secondary | ICD-10-CM

## 2014-09-20 DIAGNOSIS — I6529 Occlusion and stenosis of unspecified carotid artery: Secondary | ICD-10-CM

## 2014-09-20 NOTE — Addendum Note (Signed)
Addended by: Mena Goes on: 09/20/2014 04:53 PM   Modules accepted: Orders

## 2014-09-20 NOTE — Progress Notes (Signed)
Here today for followup of diffuse peripheral vascular occlusive disease. He is status post prior right carotid endarterectomy. He is status post bilateral renal artery stenting. Most recently his right renal artery was in March 2015. He is status post left iliac artery stenting as well. He looks quite good today. He is here with his wife. He does have claudication but is able to walk and do his routine activities despite this. He denies any neurologic deficits. He has been stable from a cardiac standpoint. He reports that he has some hypertension blood pressure is rarely greater than 993 systolic  Past Medical History  Diagnosis Date  . PVD (peripheral vascular disease)     s/p multiple revascularization procedures.  . Carotid artery occlusion     s/p right carotid endarterectomy  . Coronary artery disease     s/p coronary bypass graft surgery in 1992, stable  . Hyperlipidemia   . Hypertension   . Myocardial infarction   . Shortness of breath   . Chronic kidney disease     renal artery stenosis  . Cancer     basel cell carcinoma left ear  . GERD (gastroesophageal reflux disease)     History  Substance Use Topics  . Smoking status: Former Smoker    Types: Cigarettes    Quit date: 12/30/1990  . Smokeless tobacco: Former Systems developer    Quit date: 01/10/1991     Comment: quit smoking in 1992  . Alcohol Use: 0.6 - 1.2 oz/week    1-2 Glasses of wine per week     Comment: 1-2 cans of beer per week    Family History  Problem Relation Age of Onset  . Heart disease Other   . Stroke Mother   . Stroke Sister   . Stroke Brother     Allergies  Allergen Reactions  . Hydralazine Rash  . Adhesive [Tape]     SKIN SENSITIVITY  . Carvedilol     Blurred visino  . Clonidine Derivatives Other (See Comments)    Caused blindness in left eye for the days on the drug  . Lisinopril Other (See Comments)    Blurred vision   . Hydrazine Yellow [Tartrazine] Itching and Rash  . Isosorbide Dinitrate  Rash  . Isosorbide Mononitrate Rash    Current outpatient prescriptions:aspirin 81 MG tablet, Take 81 mg by mouth 2 (two) times daily. , Disp: , Rfl: ;  benazepril (LOTENSIN) 20 MG tablet, Take 1 tablet (20 mg total) by mouth 2 (two) times daily., Disp: 90 tablet, Rfl: 6;  clopidogrel (PLAVIX) 75 MG tablet, Take 1 tablet (75 mg total) by mouth daily., Disp: 90 tablet, Rfl: 3;  Coenzyme Q10 (CO Q-10) 100 MG CAPS, Take 100 mg by mouth daily., Disp: , Rfl:  doxazosin (CARDURA) 4 MG tablet, Take 1 tablet (4 mg total) by mouth daily., Disp: 30 tablet, Rfl: 6;  ezetimibe (ZETIA) 10 MG tablet, Take 1 tablet (10 mg total) by mouth daily., Disp: 90 tablet, Rfl: 3;  GARLIC PO, Take 7,169 mg by mouth. Takes 7 tablets daily., Disp: , Rfl: ;  hydrochlorothiazide (HYDRODIURIL) 25 MG tablet, Take 1 tablet (25 mg total) by mouth daily., Disp: 90 tablet, Rfl: 3 lansoprazole (PREVACID) 15 MG capsule, Take 1 capsule (15 mg total) by mouth daily at 12 noon., Disp: 30 capsule, Rfl: 6;  levothyroxine (SYNTHROID, LEVOTHROID) 25 MCG tablet, Take 25 mcg by mouth daily before breakfast. , Disp: , Rfl: ;  nitroGLYCERIN (NITROLINGUAL) 0.4 MG/SPRAY spray, Place 1 spray  under the tongue as needed for chest pain., Disp: , Rfl:  nortriptyline (PAMELOR) 25 MG capsule, Take 75 mg by mouth at bedtime. For sleeping, Disp: , Rfl: ;  polyethylene glycol (MIRALAX / GLYCOLAX) packet, Take 17 g by mouth daily.  , Disp: , Rfl: ;  simvastatin (ZOCOR) 40 MG tablet, Take 1 tablet (40 mg total) by mouth every evening., Disp: 90 tablet, Rfl: 3;  verapamil (COVERA HS) 240 MG (CO) 24 hr tablet, Take 240 mg by mouth daily.  , Disp: , Rfl:  Zn-Pyg Afri-Nettle-Saw Palmet (SAW PALMETTO COMPLEX PO), Take 1,725 mg by mouth daily. , Disp: , Rfl:   BP 149/66  Pulse 62  Resp 18  Ht 5\' 9"  (1.753 m)  Wt 181 lb 14.4 oz (82.509 kg)  BMI 26.85 kg/m2  Body mass index is 26.85 kg/(m^2).       Physical exam he does have bilateral carotid bruits. He is a  well-healed incision on the right. Neurologically he is grossly intact He does have about 1+ right femoral left femoral pulse Abdomen soft nontender no bruits noted  Noninvasive studies today reveal some elevated velocities in the renal arteries bilaterally Lower extremity Dopplers are stable at ankle arm index of 0.48 on the right and 0.46 on the left with monophasic waveforms Carotid duplex reveals none left internal carotid artery occlusion with moderate 40-59% right internal carotid artery stenosis.  Impression and plan: Diffuse stable peripheral vascular occlusive disease. All these were discussed. Symptoms of symptoms related to these were notified he'll notify should this occur. I will see him in one year with repeat noninvasive file

## 2014-09-25 ENCOUNTER — Encounter: Payer: Self-pay | Admitting: Cardiovascular Disease

## 2014-09-26 ENCOUNTER — Other Ambulatory Visit: Payer: Self-pay

## 2014-09-26 MED ORDER — DOXAZOSIN MESYLATE 4 MG PO TABS: 4.0000 mg | ORAL_TABLET | Freq: Every day | ORAL | Status: DC

## 2014-09-26 MED ORDER — BENAZEPRIL HCL 20 MG PO TABS: 20.0000 mg | ORAL_TABLET | Freq: Two times a day (BID) | ORAL | Status: DC

## 2014-10-06 ENCOUNTER — Encounter: Payer: Self-pay | Admitting: Cardiovascular Disease

## 2014-10-25 ENCOUNTER — Ambulatory Visit (INDEPENDENT_AMBULATORY_CARE_PROVIDER_SITE_OTHER): Payer: Commercial Managed Care - HMO | Admitting: Podiatry

## 2014-10-25 ENCOUNTER — Encounter: Payer: Self-pay | Admitting: Podiatry

## 2014-10-25 VITALS — BP 107/67 | HR 90 | Resp 16

## 2014-10-25 DIAGNOSIS — Q667 Congenital pes cavus: Secondary | ICD-10-CM

## 2014-10-25 DIAGNOSIS — M779 Enthesopathy, unspecified: Secondary | ICD-10-CM

## 2014-10-25 DIAGNOSIS — M216X9 Other acquired deformities of unspecified foot: Secondary | ICD-10-CM

## 2014-10-25 NOTE — Progress Notes (Signed)
Subjective:     Patient ID: Clifford Aguilar, male   DOB: 02/11/1941, 73 y.o.   MRN: [id number]  HPI patient states that I'm still having problems with that area and I had relief for about 6 weeks and it has returned but not as intensely   Review of Systems     Objective:   Physical Exam Significant diminishment of vascular status that is followed by Dr. early in [place] and trauma with inflammation of the right fifth metatarsal head plantarly with minimal keratotic lesion formation    Assessment:     Advance PVD with inflammation around the fifth metatarsal head right that is not ulcerated but irritated    Plan:     Explained condition at at this time scan for a accommodative type orthotic to try to reduce the stress against the area and advised on shoes that will fit the orthotics well. Reappoint when ready and scans performed today

## 2014-10-28 ENCOUNTER — Ambulatory Visit: Payer: Commercial Managed Care - HMO | Admitting: Podiatry

## 2014-11-18 ENCOUNTER — Ambulatory Visit (INDEPENDENT_AMBULATORY_CARE_PROVIDER_SITE_OTHER): Payer: Commercial Managed Care - HMO | Admitting: *Deleted

## 2014-11-18 DIAGNOSIS — Q667 Congenital pes cavus: Secondary | ICD-10-CM

## 2014-11-18 DIAGNOSIS — M216X9 Other acquired deformities of unspecified foot: Secondary | ICD-10-CM

## 2014-11-18 NOTE — Patient Instructions (Signed)

## 2014-11-18 NOTE — Progress Notes (Signed)
Pt presents for orthotic pick up , written and verbal instructions

## 2014-12-07 ENCOUNTER — Encounter (HOSPITAL_COMMUNITY): Payer: Self-pay | Admitting: Surgery

## 2014-12-08 ENCOUNTER — Encounter (HOSPITAL_COMMUNITY): Payer: Self-pay | Admitting: Vascular Surgery

## 2014-12-13 ENCOUNTER — Ambulatory Visit (INDEPENDENT_AMBULATORY_CARE_PROVIDER_SITE_OTHER): Payer: Commercial Managed Care - HMO | Admitting: Podiatry

## 2014-12-13 ENCOUNTER — Encounter: Payer: Self-pay | Admitting: Podiatry

## 2014-12-13 VITALS — BP 106/66 | HR 90 | Resp 16

## 2014-12-13 DIAGNOSIS — M216X9 Other acquired deformities of unspecified foot: Secondary | ICD-10-CM

## 2014-12-13 DIAGNOSIS — M779 Enthesopathy, unspecified: Secondary | ICD-10-CM

## 2014-12-13 DIAGNOSIS — Q667 Congenital pes cavus: Secondary | ICD-10-CM

## 2014-12-13 NOTE — Progress Notes (Signed)
Subjective:     Patient ID: Clifford Aguilar, male   DOB: December 26, 1941, 73 y.o.   MRN: [id number]  HPI patient presents stating his feet feel better overall with orthotics but he feels like they're too hard and he still can get some tenderness of these on them too much   Review of Systems     Objective:   Physical Exam Neurovascular status unchanged with discomfort in the mid arch area bilateral secondary to significant structural flatfoot deformity    Assessment:     Tendinitis present with inflammation    Plan:     Dispensed orthotics with instructions on usage and reviewed that we will send them back and have them re-fabricated with a more cushioned surface that we'll go to the end of the toes. Reappoint when those are returned and discussed physical therapy to do at this time

## 2014-12-19 ENCOUNTER — Encounter: Payer: Self-pay | Admitting: Cardiovascular Disease

## 2014-12-19 ENCOUNTER — Ambulatory Visit (INDEPENDENT_AMBULATORY_CARE_PROVIDER_SITE_OTHER): Payer: Commercial Managed Care - HMO | Admitting: Cardiovascular Disease

## 2014-12-19 ENCOUNTER — Ambulatory Visit: Payer: Medicare PPO | Admitting: Cardiovascular Disease

## 2014-12-19 VITALS — BP 130/58 | HR 64 | Ht 69.0 in | Wt 185.5 lb

## 2014-12-19 DIAGNOSIS — I6523 Occlusion and stenosis of bilateral carotid arteries: Secondary | ICD-10-CM

## 2014-12-19 DIAGNOSIS — I10 Essential (primary) hypertension: Secondary | ICD-10-CM

## 2014-12-19 DIAGNOSIS — E785 Hyperlipidemia, unspecified: Secondary | ICD-10-CM

## 2014-12-19 DIAGNOSIS — I25718 Atherosclerosis of autologous vein coronary artery bypass graft(s) with other forms of angina pectoris: Secondary | ICD-10-CM

## 2014-12-19 NOTE — Assessment & Plan Note (Signed)
Blood pressure is well controlled on today's visit. No changes made to the medications. 

## 2014-12-19 NOTE — Progress Notes (Signed)
Patient ID: Clifford Aguilar, male    DOB: [birth year], 73 y.o.   MRN: [id number]  HPI Comments: 73 years old with CAD, peripheral vascular disease,  bypass surgery in 1992,  carotid endarterectomy on the right with residual 60-70% disease on last ultrasound in 2013 , chronically occluded left carotid artery,  multiple surgical procedures on his lower extremity by Dr. Donnetta Hutching, left iliac stent and left renal angioplasty with Dr. Trula Slade on 12/03/2011, who presents for routine followup of his carotid arterial disease S/p renal stent for severe HTN. He does have significant celiac and superior mesenteric arterial disease estimated at greater than 75%. ABIs 0.5 or less bilaterally April 2013.  In followup today he reports that he is doing well. He denies any recent chest pain or shortness of breath.  Blood pressure is labile usually when in a range of 115 up to 150s. No lightheadedness or dizziness. He is otherwise active with no complaints.  No regular exercise program. Is seen for his PAD in [place], reports it is stable. Monitored by Dr.  Donnetta Hutching Recent lab work showing creatinine 1.34, BUN 31, total cholesterol 141, LDL 74, HDL 36  EKG on today's visit showing normal sinus rhythm with no significant ST or T-wave changes  Other past medical history  in January 2015, he developed symptoms of "head heaviness ", with discomfort in the back of his neck He took a NTG, and subsequently his BP dropped, he had syncope, family called 911, he was taken by emt to [facility] It was felt his hypotension related to treatment with Cardura in addition to taking sublingual nitroglycerin in the standing position. He ruled out for myocardial infarction. He underwent a nuclear stress test which showed no evidence of ischemia. Echocardiogram was also unremarkable. He was discharged home on hydralazine in addition to his other blood pressure medications. Since then he reports stopping hydralazine secondary to rash.  In fact he  has numerous medications that have given him a rash   3/15 he was seen by Dr.  Donnetta Hutching Found to have renal artery stenosis that had progressed,  3/18 he had catheterization for renal artery stent placement  Previous history of blurry vision episodes.    He has not had a cardiac catheterization or stress test in 10 years. He believes he had a catheterization 10 years ago, 3 catheterizations since his bypass surgery 20 years ago.  He quit smoking after his bypass surgery in 1992.       Allergies  Allergen Reactions  . Hydralazine Rash  . Adhesive [Tape]     SKIN SENSITIVITY  . Carvedilol     Blurred visino  . Clonidine Derivatives Other (See Comments)    Caused blindness in left eye for the days on the drug  . Lisinopril Other (See Comments)    Blurred vision   . Hydrazine Yellow [Tartrazine] Itching and Rash  . Isosorbide Dinitrate Rash  . Isosorbide Mononitrate Rash    Outpatient Encounter Prescriptions as of 12/19/2014  Medication Sig  . aspirin 81 MG tablet Take 81 mg by mouth 2 (two) times daily.   . benazepril (LOTENSIN) 20 MG tablet Take 1 tablet (20 mg total) by mouth 2 (two) times daily.  . clopidogrel (PLAVIX) 75 MG tablet Take 1 tablet (75 mg total) by mouth daily.  . Coenzyme Q10 (CO Q-10) 100 MG CAPS Take 100 mg by mouth daily.  Marland Kitchen doxazosin (CARDURA) 4 MG tablet Take 1 tablet (4 mg total) by mouth daily.  Marland Kitchen  ezetimibe (ZETIA) 10 MG tablet Take 1 tablet (10 mg total) by mouth daily.  Marland Kitchen GARLIC PO Take 5,188 mg by mouth. Takes 7 tablets daily.  . hydrochlorothiazide (HYDRODIURIL) 25 MG tablet Take 1 tablet (25 mg total) by mouth daily.  . lansoprazole (PREVACID) 15 MG capsule Take 1 capsule (15 mg total) by mouth daily at 12 noon.  Marland Kitchen levothyroxine (SYNTHROID, LEVOTHROID) 25 MCG tablet Take 25 mcg by mouth daily before breakfast.   . nitroGLYCERIN (NITROLINGUAL) 0.4 MG/SPRAY spray Place 1 spray under the tongue as needed for chest pain.  . nortriptyline (PAMELOR) 25 MG  capsule Take 75 mg by mouth at bedtime. For sleeping  . polyethylene glycol (MIRALAX / GLYCOLAX) packet Take 17 g by mouth daily.    . simvastatin (ZOCOR) 40 MG tablet Take 1 tablet (40 mg total) by mouth every evening.  . verapamil (COVERA HS) 240 MG (CO) 24 hr tablet Take 240 mg by mouth daily.    Marland Kitchen Zn-Pyg Afri-Nettle-Saw Palmet (SAW PALMETTO COMPLEX PO) Take 1,725 mg by mouth daily.     Past Medical History  Diagnosis Date  . PVD (peripheral vascular disease)     s/p multiple revascularization procedures.  . Carotid artery occlusion     s/p right carotid endarterectomy  . Coronary artery disease     s/p coronary bypass graft surgery in 1992, stable  . Hyperlipidemia   . Hypertension   . Myocardial infarction   . Shortness of breath   . Chronic kidney disease     renal artery stenosis  . Cancer     basel cell carcinoma left ear  . GERD (gastroesophageal reflux disease)     Past Surgical History  Procedure Laterality Date  . Surgery for sleep apnea    . Coronary artery bypass graft  1992  . Carotid endarterectomy      Right. With 46% stenosis in the right by recent Doppler  . Peripheral vascular revascularization     . Shoulder arthroscopy      Right Amnioplasty; Acromioclavicular joint resection and arthroscopic debridement  . Renal artery stenosis  12/03/2011  . Left common iliac  12/03/2011  . Abdomial angiogram  03-16-2014  . Renal angiogram  03-16-2014  . Abdominal aortagram N/A 12/03/2011    Procedure: ABDOMINAL Maxcine Ham;  Surgeon: Serafina Mitchell, MD;  Location: [facility] CATH LAB;  Service: Cardiovascular;  Laterality: N/A;  . Renal angiogram Left 12/03/2011    Procedure: RENAL ANGIOGRAM;  Surgeon: Serafina Mitchell, MD;  Location: [facility] CATH LAB;  Service: Cardiovascular;  Laterality: Left;  lt renal artery stent  . Abdominal angiogram N/A 03/16/2014    Procedure: ABDOMINAL ANGIOGRAM;  Surgeon: Rosetta Posner, MD;  Location: [facility] CATH LAB;  Service: Cardiovascular;  Laterality:  N/A;  . Renal angiogram N/A 03/16/2014    Procedure: RENAL ANGIOGRAM;  Surgeon: Rosetta Posner, MD;  Location: [facility] CATH LAB;  Service: Cardiovascular;  Laterality: N/A;    Social History  reports that he quit smoking about 23 years ago. His smoking use included Cigarettes. He smoked 0.00 packs per day. He quit smokeless tobacco use about 23 years ago. He reports that he drinks about 0.6 - 1.2 oz of alcohol per week. He reports that he does not use illicit drugs.  Family History family history includes Heart disease in his other; Stroke in his brother, mother, and sister.  Review of Systems  Constitutional: Negative.   Respiratory: Negative.   Cardiovascular: Negative.   Gastrointestinal: Negative.  Musculoskeletal: Negative.        Claudication pain with exertion of the legs  Neurological: Negative.   Hematological: Negative.   Psychiatric/Behavioral: Negative.   All other systems reviewed and are negative.   BP 130/58 mmHg  Pulse 64  Ht 5\' 9"  (1.753 m)  Wt 185 lb 8 oz (84.142 kg)  BMI 27.38 kg/m2  Physical Exam  Constitutional: He is oriented to person, place, and time. He appears well-developed and well-nourished.  HENT:  Head: Normocephalic.  Nose: Nose normal.  Mouth/Throat: Oropharynx is clear and moist.  Eyes: Conjunctivae are normal. Pupils are equal, round, and reactive to light.  Neck: Normal range of motion. Neck supple. No JVD present. Carotid bruit is present.  Cardiovascular: Normal rate, regular rhythm, S1 normal, S2 normal and intact distal pulses.  Exam reveals no gallop and no friction rub.   No murmur heard. Pulses:      Dorsalis pedis pulses are 0 on the right side, and 0 on the left side.       Posterior tibial pulses are 0 on the right side, and 0 on the left side.  Pulmonary/Chest: Effort normal and breath sounds normal. No respiratory distress. He has no wheezes. He has no rales. He exhibits no tenderness.  Abdominal: Soft. Bowel sounds are normal. He  exhibits no distension. There is no tenderness.  Musculoskeletal: Normal range of motion. He exhibits no edema or tenderness.  Lymphadenopathy:    He has no cervical adenopathy.  Neurological: He is alert and oriented to person, place, and time. Coordination normal.  Skin: Skin is warm and dry. No rash noted. No erythema.  Psychiatric: He has a normal mood and affect. His behavior is normal. Judgment and thought content normal.      Assessment and Plan   Nursing note and vitals reviewed.

## 2014-12-19 NOTE — Assessment & Plan Note (Signed)
Currently with no symptoms of angina. No further workup at this time. Continue current medication regimen. 

## 2014-12-19 NOTE — Assessment & Plan Note (Signed)
Followed by vascular in [facility]

## 2014-12-19 NOTE — Patient Instructions (Signed)
You are doing well. No medication changes were made.  Please call us if you have new issues that need to be addressed before your next appt.  Your physician wants you to follow-up in: 6 months.  You will receive a reminder letter in the mail two months in advance. If you don't receive a letter, please call our office to schedule the follow-up appointment.   

## 2014-12-19 NOTE — Assessment & Plan Note (Signed)
Cholesterol is at goal on the current lipid regimen. No changes to the medications were made.  

## 2015-04-01 ENCOUNTER — Other Ambulatory Visit: Payer: Self-pay | Admitting: Cardiovascular Disease

## 2015-04-02 ENCOUNTER — Encounter: Payer: Self-pay | Admitting: Cardiovascular Disease

## 2015-04-03 ENCOUNTER — Telehealth: Payer: Self-pay

## 2015-04-03 MED ORDER — EZETIMIBE 10 MG PO TABS: 10.0000 mg | ORAL_TABLET | Freq: Every day | ORAL | Status: DC

## 2015-04-03 MED ORDER — SIMVASTATIN 40 MG PO TABS: 40.0000 mg | ORAL_TABLET | Freq: Every evening | ORAL | Status: DC

## 2015-04-03 NOTE — Telephone Encounter (Signed)
Refill sent for 90 day supply to [facility]

## 2015-04-04 ENCOUNTER — Encounter: Payer: Self-pay | Admitting: Cardiovascular Disease

## 2015-04-04 ENCOUNTER — Encounter: Payer: Self-pay | Admitting: Vascular Surgery

## 2015-04-04 ENCOUNTER — Telehealth: Payer: Self-pay

## 2015-04-04 DIAGNOSIS — I739 Peripheral vascular disease, unspecified: Secondary | ICD-10-CM

## 2015-04-04 MED ORDER — HYDROCHLOROTHIAZIDE 25 MG PO TABS: 25.0000 mg | ORAL_TABLET | Freq: Every day | ORAL | Status: DC

## 2015-04-04 MED ORDER — CLOPIDOGREL BISULFATE 75 MG PO TABS: 75.0000 mg | ORAL_TABLET | Freq: Every day | ORAL | Status: DC

## 2015-04-04 NOTE — Telephone Encounter (Signed)
Refill sent for HCTZ 90 day supply to [facility].

## 2015-04-22 NOTE — Discharge Summary (Signed)
PATIENT NAME:  Clifford Aguilar, CARLINE MR#:  [id number] DATE OF BIRTH:  Jan 04, 1941  DATE OF ADMISSION:  01/12/2014 DATE OF DISCHARGE:  01/13/2014  ADMISSION DIAGNOSES: 1.  Chest pain.  2.  Syncope.  DISCHARGE DIAGNOSES: 1.  Chest pain. 2.  Presyncope.  3.  A history of coronary artery disease.   CONSULTATIONS:  Cardiology.   IMAGING:  The patient underwent a Myoview stress test which essentially showed low probability for ischemia.   A 2-D echocardiogram showed an EF of 55% to 60% with impaired relaxation pattern with LV diastolic dysfunction.   Troponins were negative.   HOSPITAL COURSE:  This is a very pleasant 74 year old male who presented with chest pain. Also presyncope. For further details, please refer to the H and P.   1.  Chest pain. The patient was admitted to telemetry. His cardiac enzymes were cycled x 3, all of which were negative. Cardiology was consulted. They recommended a Myoview stress test. The patient underwent a Myoview stress test, which essentially was low risk probability for acute coronary syndrome. He also underwent an echocardiogram showed normal ejection fraction. He does have some diastolic dysfunction. He continued with current medical management.  2.  Malignant hypertension. The patient's blood pressure was elevated. He was recently started on Cardura for blood pressure. This was stopped secondary to presyncope. I added the hydralazine, 25 mg b.i.d., which can be titrated up as an outpatient. I did ask the patient to monitor his blood pressure twice a day and have an accurate record for him to bring to a cardiologist's office.  3.  Presyncope. The patient took Cardura and right afterwards took nitroglycerin. The thought that this presyncope was secondary to vasodilation. We have stopped Cardura now due to the syncope.  4.  Coronary artery disease. The patient will continue his outpatient medications.  5.  Hypothyroidism. The patient will continue his Synthroid.    DISCHARGE MEDICATIONS:  1.  Verapamil 240 mg daily.  2.  Benazepril 20 mg 1 tablet b.i.d.  3.  Plavix 75 mg daily. 4.  Zetia 10 mg daily.  5.  Simvastatin 40 mg daily.  6.  Hydrochlorothiazide 25 mg daily.  7.  MiraLAX 17 grams daily.  8.  Nortriptyline 25 mg 3 tablets at bedtime.  9.  Nitroglycerin sublingual p.r.n. chest pain.  10.  Aspirin 81 mg 2 tablets daily.  11.  Garlic 1 tablet daily.  12.  Co-Q10 1 tablet daily.  13.  Synthroid 25 mcg daily.  14.  Prevacid 50 mg at bedtime.  15.  Hydralazine 25 mg b.i.d.   DISCHARGE DIET:  Low fat, low cholesterol.  DISCHARGE ACTIVITY:  As tolerated.  DISCHARGE FOLLOWUP:  The patient will follow up in 1 to 2 weeks with Dr. Rosanna Randy and in one week with [facility].   TIME SPENT:  Approximately 35 minutes. The patient is medically stable for discharge.   ____________________________ Zackery Brine P. Benjie Karvonen, MD spm:jm D: 01/13/2014 13:32:31 ET T: 01/13/2014 16:16:46 ET JOB#: [id number]  cc: Tameah Mihalko P. Benjie Karvonen, MD, <Dictator> Richard L. Rosanna Randy, [place] P Milica Gully MD ELECTRONICALLY SIGNED 01/13/2014 19:30

## 2015-04-22 NOTE — Consult Note (Signed)
General Aspect [facility] consulted for Syncope & ? Unstable Angina.  Primary Cardiologist: Dr. Mariah Milling (formerly Dr. Brendolyn Patty)  Cardiac History: --CAD: MI 68 - MV CAD --> CABG x 4 (3 caths since CABG, last 1 ~10 yrs ago)         Last MPI ST - 2006: no ischemia; EF 69%       Cath report not available --PAD: Most recent interventino L C&Ext Iliac stent (5mm x 80 mm stent) 11/2011 - [facility], Dr. Myra Gianotti --> --ABI ~0.5 bilateral       ? Fem-pop procedures 1994, 1995 & 1996; Fem Thrombectomy 2000 RAS: L Renal A stent 8mm x 11cm (11/2011) Celiac & Mesenteric Stenosis Carotid Disease: Known L occludion, RCEA 1993  SH: Married.   Lives with wife.  Retired.  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 Quit smoking in 1992.  Occasional Red Wine. FH: Brother & Mother - h/o CVA, sister extensive CAD   Present Illness Very pleasant 74 y/o man with longstanding CAD& PAD history (MI-CABGin 2002 with reportedly no further cardiac eval since -- will need to pull up [facility] History in AM for full details, last CAth 10 yrs ago; R CAE, Fem-pop PV procedures, RAS with renal stents etc) along wtih HTN, HLD, hypothyroidism (treated) who has been recently evaluated by DR. Gollan for unsual intermittent spells of L sided chest discomfort & rising BP.   He is now admitted after a syncopal episode while working at the [employer].  He was otherwise in his USOH, feeling well this AM.  Drank a fuir smoothy for breakfast & went to the [employer].  Was cutting up fruit & started feeling L sided chest tightness & aching in the back of his neck.  He walked outside to get some air & took a SL NTG spray.  Within 5 min, the chest discomfort subsided, so he got up to walk in.  While walking in, he blacked out - LOC x <1 min.  No sensation of palpitations, irregular or rapid HR.  The CP had been relieved.  Wife called [facility] to inform us that he was en-route to [facility] via EMS.  In ER - noted orthostatic hypotension. (supine 11/56  ---> 89/49 sitting --> 75/47 standing).  Has noted chest tightness while riding his stationary bicycle --> plan was for OP Myoview that wasn not completed as ordered due to insurance changes & prior authorization.   Recentlyh had Carvedilol dose increased that caused blurry vision as it did wiht Imdur, Isordil & Clonidine. Was just started on Cardura on 1/13 - today was ~3rd dose.  Cardiac ROS: + initially exertional chest tightness, but today while cutting fruit; usually he is limited walking by caludication & not dyspnea/angina.  No PND/orthopnea, edema.  Mild varicose veins that are chronic.  No palpitations, irregular Heartbeats.  No melena, hematochezia or hematuria.  No prior h/o syncope or near syncope.  No TIA or amaurosis fugax episodes.   Physical Exam:  GEN well developed, well nourished, no acute distress, healthy appearing   HEENT pink conjunctivae, PERRL, hearing intact to voice, moist oral mucosa   NECK supple  No masses  thyroid not tender  trachea midline  R CAE scar; Bilateral bruits   RESP normal resp effort  clear BS  no use of accessory muscles  no W/R/R   CARD Regular rate and rhythm  Normal, S1, S2  Murmur   Murmur Systolic   Systolic Murmur Out flow  SEM 3/6  crescendo-descresendo   ABD denies tenderness  no liver/spleen enlargement  no hernia  soft  no Abdominal Bruits  no Adominal Mass   LYMPH negative neck   EXTR negative cyanosis/clubbing, negative edema, Bilateral DP/PT pulses palpable.   SKIN Bilateral LE venous stasis dermatitis - mild, with a healing ulcer on LLE.   NEURO cranial nerves intact, negative tremor, motor/sensory function intact   PSYCH alert, A+O to time, place, person, good insight   Review of Systems:  Subjective/Chief Complaint Syncope Chest Tightness   General: No Complaints   Skin: No Complaints   ENT: No Complaints   Eyes: blurred vision related to medications - not recent   Neck: No Complaints   Respiratory: No  Complaints   Cardiovascular: Chest pain or discomfort  Tightness   Gastrointestinal: No Complaints   Genitourinary: No Complaints   Vascular: Calf pain with walking   Neurologic: No Complaints   Hematologic: No Complaints   Endocrine: No Complaints   Psychiatric: No Complaints   Review of Systems: All other systems were reviewed and found to be negative   Medications/Allergies Reviewed Medications/Allergies reviewed     Hypercholesterolemia:    MI - Myocardial Infarct:    HTN:    Prostatectomy:    Colon Polypectomy:    CABG (Coronary Artery Bypass Graft):    Cataract Extraction:    Back Surgery:    Stent - kidney left:        Admit Diagnosis:   ANGINA PECTORIS: Onset Date: 12-Jan-2014, Status: Active, Description: ANGINA PECTORIS      Admit Reason:   Angina pectoris (413.9): Status: Active, Coding System: ICD9, Coded Name: Other and unspecified angina pectoris  Home Medications: Medication Instructions Status  verapamil 240 mg/24 hours oral capsule, extended release 1 cap(s) orally once a day (in the morning) Active  benazepril 20 mg oral tablet 1 tab(s) orally 2 times a day Active  clopidogrel 75 mg oral tablet 1 tab(s) orally once a day (in the morning) Active  Zetia 10 mg oral tablet 1 tab(s) orally once a day (in the evening) Active  simvastatin 40 mg oral tablet 1 tab(s) orally once a day (in the evening) Active  hydrochlorothiazide 25 mg oral tablet 1 tab(s) orally once a day (in the morning) Active  MiraLax - oral powder for reconstitution 17 gram(s) orally once a day Active  nortriptyline 25 mg oral capsule 3 cap(s) orally once a day (in the evening) Active  Nitrolingual 0.4 mg sublingual spray 1 spray(s) sublingual every 5 minutes, As Needed - for Chest Pain Active  aspirin 81 mg oral tablet 2 tab(s) orally once a day (in the morning) Active  Garlic - oral tablet 7 tab(s) (7000 mg) orally once a day (in the morning) Active  Saw Palmetto - oral  capsule 1 cap(s) (1725 mg) orally once a day (in the morning) Active  Co Q-10 100 mg oral capsule 1 cap(s) orally once a day (in the morning) Active  levothyroxine 25 mcg (0.025 mg) oral tablet 1 tab(s) orally once a day (in the morning) before breakfast Active  Prevacid 15 mg oral delayed release capsule 1 cap(s) orally once a day (in the evening) Active  Cardura 4 mg oral tablet 1 tab(s) orally once a day Active   Lab Results: Routine Chem:  14-Jan-15 11:50   Glucose, Serum  143  BUN  23  Creatinine (comp)  1.37  Sodium, Serum  134  Potassium, Serum 4.4  Chloride, Serum 103  CO2, Serum 23  Calcium (Total), Serum 8.9  eGFR (Non-African American)  52 (eGFR values <28mL/min/1.73 m2 may be an indication of chronic kidney disease (CKD). Calculated eGFR is useful in patients with stable renal function. The eGFR calculation will not be reliable in acutely ill patients when serum creatinine is changing rapidly. It is not useful in  patients on dialysis. The eGFR calculation may not be applicable to patients at the low and high extremes of body sizes, pregnant women, and vegetarians.)  Cardiac:  14-Jan-15 11:50   Troponin I < 0.02 (0.00-0.05 0.05 ng/mL or less: NEGATIVE  Repeat testing in 3-6 hrs  if clinically indicated. >0.05 ng/mL: POTENTIAL  MYOCARDIAL INJURY. Repeat  testing in 3-6 hrs if  clinically indicated. NOTE: An increase or decrease  of 30% or more on serial  testing suggests a  clinically important change)    15:49   Troponin I < 0.02 (0.00-0.05 0.05 ng/mL or less: NEGATIVE  Repeat testing in 3-6 hrs  if clinically indicated. >0.05 ng/mL: POTENTIAL  MYOCARDIAL INJURY. Repeat  testing in 3-6 hrs if  clinically indicated. NOTE: An increase or decrease  of 30% or more on serial  testing suggests a  clinically important change)    19:52   Troponin I < 0.02 (0.00-0.05 0.05 ng/mL or less: NEGATIVE  Repeat testing in 3-6 hrs  if clinically indicated. >0.05  ng/mL: POTENTIAL  MYOCARDIAL INJURY. Repeat  testing in 3-6 hrs if  clinically indicated. NOTE: An increase or decrease  of 30% or more on serial  testing suggests a  clinically important change)  Routine UA:  14-Jan-15 16:47   Color (UA) Yellow  Clarity (UA) Clear  Glucose (UA) Negative  Bilirubin (UA) Negative  Ketones (UA) Negative  Specific Gravity (UA) 1.012  Blood (UA) Negative  pH (UA) 7.0  Protein (UA) Negative  Nitrite (UA) Negative  Leukocyte Esterase (UA) Negative (Result(s) reported on 12 Jan 2014 at 05:09PM.)  RBC (UA) <1 /HPF  WBC (UA) <1 /HPF  Bacteria (UA) NONE SEEN  Epithelial Cells (UA) NONE SEEN  Mucous (UA) PRESENT  Hyaline Cast (UA) 13 /LPF (Result(s) reported on 12 Jan 2014 at 05:09PM.)  Routine Hem:  14-Jan-15 11:50   WBC (CBC) 8.4  Hemoglobin (CBC) 13.6  Hematocrit (CBC)  39.0  Platelet Count (CBC) 184   EKG:  Additional Comments No ECG available to interpret  Clinc ECG reportedly with diffuse lateral ST abnormalities consider ischemia   Radiology Results: XRay:    14-Jan-15 13:07, Chest PA and Lateral  Chest PA and Lateral   REASON FOR EXAM:    chest pain  COMMENTS:       PROCEDURE: DXR - DXR CHEST PA (OR AP) AND LATERAL  - Jan 12 2014  1:07PM     CLINICAL DATA:  Chest pressure    EXAM:  CHEST  2 VIEW    COMPARISON:  None.    FINDINGS:  Normal heart size. Clear lungs. Interstitial prominence and  bronchitic changes are stable. Postoperative changes from coronary  artery bypass grafting. No pleural effusion. No pneumothorax.     IMPRESSION:  No active cardiopulmonary disease.      Electronically Signed    By: Maryclare Bean M.D.    On: 01/12/2014 13:37         Verified By: Jamas Lav, M.D.,    Tape: Unknown  Isosorbide Mononitrate: Rash  Clonidine: Blurred Vision  Coreg: Blurred Vision  Lisinopril: Blurred Vision  Isosorbide Dinitrate: Rash  Vital Signs/Nurse's Notes: **Vital  Signs.:   14-Jan-15 17:17  Vital  Signs Type Routine  Temperature Temperature (F) 98  Temperature Source oral  Pulse Pulse 57  Respirations Respirations 20  Systolic BP Systolic BP 811  Diastolic BP (mmHg) Diastolic BP (mmHg) 62  Mean BP 99  Pulse Ox % Pulse Ox % 96  Pulse Ox Activity Level  At rest  Oxygen Delivery Room Air/ 21 %    19:31  Vital Signs Type Routine  Temperature Temperature (F) 98.1  Celsius 36.7  Temperature Source oral  Pulse Pulse 65  Systolic BP Systolic BP 572  Diastolic BP (mmHg) Diastolic BP (mmHg) 64  Mean BP 93  Pulse Ox % Pulse Ox % 96  Pulse Ox Activity Level  At rest  Oxygen Delivery Room Air/ 21 %    Impression 74 y/o man with long standing, extensive history of CAD & PAD along with difficult to control HTN. Admitted for a combination of 2 issues: Progressive chest discomfort concerning for possible Angina & an episode os Syncope that would appear to be orthostatic related with recent Cardura followed by SL NTG. -- confirmed in ER.  No SSx of arrythmia, however following CP, cannot be clear.  Need to r/o MI.   Plan Syncope: Hold BP meds for now as you are -->  -- no more Cardura (alpha blockade with peripheral vasodilation leading to orthostatic hypotension).  --Gentle hydration. -- Follow on Tele -- Agree with Echo - non recorded on our files recently  More concerning is the CP episode -- with recent exertional chest discomfort & now with minimal exertion, it is crucial to assess for ischemia.  The plan had been Myoview as OP -- if he r/o MI, not unreasonable to proceed with Myoview as inpatient.  -- no active discomfort. -- if pain recurs, would add IV Heparin & plan to convert from ST to Cath tomorrow. -- continue ASA & Plavix, ARB (hold HCTZ unless BP increases noticeably) -- continue Statin/Zetia   We will reassess in AM.    Thank you for consulting.   Electronic Signatures: Leonie Man (MD)  (Signed 14-Jan-15 21:30)  Authored: General Aspect/Present Illness, History  and Physical Exam, Review of System, Past Medical History, Health Issues, Home Medications, Labs, EKG , Radiology, Allergies, Vital Signs/Nurse's Notes, Impression/Plan   Last Updated: 14-Jan-15 21:30 by Leonie Man (MD)

## 2015-04-22 NOTE — H&P (Signed)
PATIENT NAME:  Clifford Aguilar, Clifford Aguilar MR#:  [id number] DATE OF BIRTH:  07-02-1941  DATE OF ADMISSION:  01/12/2014  DICTATING HOSPITALIST: Max Sane, MD  PRIMARY CARE PHYSICIAN: Miguel Aschoff, MD  REQUESTING PHYSICIAN:  Dr. Mariea Clonts.   CARDIOLOGY PHYSICIAN: Dr. Rockey Situ.   CHIEF COMPLAINT: Presyncope and chest pain.   HISTORY OF PRESENT ILLNESS: The patient is a 74 year old male with a known history of coronary artery disease status post CABG in 1992, peripheral vascular disease status post femoral popliteal repair in 1996. He is being admitted for unstable angina. 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 Took some nitroglycerin, after which he was very dizzy and almost syncopized. He does not remember anything about the event, but he was lying on the floor for a few seconds, less than 3 seconds. The patient was at Dr. Donivan Scull office last Monday and was scheduled to have a stress test Tuesday, but his insurance did not authorize, so this was postponed. He denies any chest pain now, but considering his extensive cardiac and vascular history, he is being admitted for further evaluation and management.   PAST MEDICAL HISTORY:  1.  Hyperlipidemia.  2.  History of coronary artery disease status post CABG in 1992.  3.  Hypertension.  4.  Constipation.  5.  Hypothyroidism.   ALLERGIES: CLONIDINE, COREG, ISOSORBIDE DINITRATE, ISOSORBIDE MONONITRATE, AND LISINOPRIL, ALL CAUSING BLURRY VISION. ALSO ALLERGIC TO LATEX TAPE.   PAST SURGICAL HISTORY: Lumbar disk fusion in 1983. Left cataract removal in 1991. Quadruple coronary bypass in 1992. Carotid endarterectomy in 1993. Femoropopliteal implant in 1994, which was replaced in 1995 and repaired in 1996. Colon polyp removal in 1994 and 1997  along with one in 2009. Status post stents in the  left renal and iliac arteries in 2012. Femoral thrombectomy in 2000.   HOME MEDICATIONS :  1.  Aspirin 81 mg 2 tablets p.o. daily.  2.  Benazepril 20 mg p.o. b.i.d.  3.  Cardura 4 mg p.o. daily which was started yesterday.  4.  Plavix 75 mg p.o. daily.  5.  Coenzyme Q 100 mg p.o. daily.  6.  Garlic 8101 mg p.o. daily.  7.  Hydrochlorothiazide 25 mg p.o. daily.  8.  Levothyroxine 25 mcg p.o. daily.  9.  MiraLax once daily.  10.  Nitroglycerin sublingual 0.4 mg every 5 minutes as needed.  11.  Nortriptyline 25 mg 3 capsules p.o. at bedtime.  12.  Prevacid 15 mg p.o. daily.  13.  Saw palmetto 1 capsule p.o. daily.  14.  Simvastatin 40 mg p.o. daily. 15.  Zetia 10 mg p.o. daily.   SOCIAL HISTORY: No smoking. Quit in 1992. Drinks a glass of red wine daily.   FAMILY HISTORY:  1.  Mother with CVA.  2.  Brother had CVA.  3.  Sister with extensive coronary disease.   REVIEW OF SYSTEMS:  CONSTITUTIONAL: No fever, fatigue, weakness.  EYES: No blurry or double vision.  ENT: No tinnitus or ear pain.  RESPIRATORY: No cough, wheezing, hemoptysis.  CARDIOVASCULAR: Positive for chest pain. No orthopnea, edema. History of coronary artery disease.  GASTROINTESTINAL: No nausea, vomiting, diarrhea.  GENITOURINARY: No dysuria or hematuria.  ENDOCRINE: No polyuria or nocturia.  HEMATOLOGIC: No anemia or easy bruising.  SKIN: No rash or lesion.  MUSCULOSKELETAL: No arthritis or muscle cramp.  NEUROLOGIC: History  of neck stiffness and now presyncope PSYCHIATRY: No history of anxiety or depression.   PHYSICAL EXAMINATION:  VITAL SIGNS: Temperature 97.6, heart rate 51 per minute, respirations 18 per minute. Blood pressure 111/56, which dropped to 89/49 from lying to sitting, and on standing it dropped to 75/47. He was saturating 95% on room air.  GENERAL: The patient is a 74 year old male lying in the bed comfortably without any acute distress.  EYES: Pupils equal, round, reactive to light and  accommodation. No scleral icterus. Extraocular muscles intact.  HEENT: Head atraumatic, normocephalic. Oropharynx and nasopharynx clear.  NECK: Supple. No jugular venous distention. No thyroid enlargement or tenderness.  LUNGS: Clear to auscultation bilaterally. No wheezes, rales, or rhonchi or crepitation.  CARDIOVASCULAR: S1, S2 normal. No murmurs, rubs, or gallops. ABDOMEN: Soft, nontender, nondistended. Bowel sounds present. No hepatosplenomegaly or masses.  EXTREMITIES: No peripheral edema, cyanosis or clubbing.  NEUROLOGIC: Nonfocal examination. Cranial nerves II through XII intact.  MUSCULOSKELETAL: Muscle strength 5/5 in all extremities. Sensation intact.  PSYCHIATRIC: The patient is alert and oriented x3.  SKIN: No obvious rash, lesion, or ulcer.  LABORATORY PANELS:  Normal BMP except creatinine 1.37, blood sugar of 142. Normal first set of cardiac enzymes. Normal CBC except hematocrit of 39.   Chest x-ray in the ED showed no acute cardiopulmonary disease.   EKG showed sinus bradycardia, heart rate of 53 per minute. No major ST-T changes.   IMPRESSION AND PLAN:  1.  Unstable angina. We will get serial troponins. Consult Cardiology. Obtain Myoview in the morning, a 2-D echocardiogram.  2.  Presyncope, likely due to orthostatic hypertension. He started taking Cardura yesterday. We will hold off this right now with his significant orthostasis.  3.  Coronary artery disease status post coronary artery bypass graft in 1992 x4. We will consult Cardiology, obtain serial troponins, continue aspirin and Plavix. Obtain 2-D echo and monitor him on telemetry. 4.  Acute renal failure, likely prerenal. Avoid nephrotoxin. Monitor and with IV hydration.   CODE STATUS: FULL CODE.   TOTAL TIME SPENT TAKING CARE OF THIS PATIENT: 45 minutes.    ____________________________ Lucina Mellow. Manuella Ghazi, MD vss:np D: 01/12/2014 15:21:28 ET T: 01/12/2014 16:07:17 ET JOB#: [id number]  cc: Sharvi Mooneyhan S. Manuella Ghazi, MD,  <Dictator> Richard L. Rosanna Randy, MD Minna Merritts, MD  Lucina Mellow [facility] MD ELECTRONICALLY SIGNED 01/19/2014 14:41

## 2015-06-20 ENCOUNTER — Ambulatory Visit (INDEPENDENT_AMBULATORY_CARE_PROVIDER_SITE_OTHER): Payer: Commercial Managed Care - HMO | Admitting: Cardiovascular Disease

## 2015-06-20 ENCOUNTER — Encounter: Payer: Self-pay | Admitting: Cardiovascular Disease

## 2015-06-20 VITALS — BP 138/70 | HR 59 | Ht 69.0 in | Wt 183.2 lb

## 2015-06-20 DIAGNOSIS — I739 Peripheral vascular disease, unspecified: Secondary | ICD-10-CM

## 2015-06-20 DIAGNOSIS — E785 Hyperlipidemia, unspecified: Secondary | ICD-10-CM

## 2015-06-20 DIAGNOSIS — I10 Essential (primary) hypertension: Secondary | ICD-10-CM | POA: Diagnosis not present

## 2015-06-20 DIAGNOSIS — I2510 Atherosclerotic heart disease of native coronary artery without angina pectoris: Secondary | ICD-10-CM | POA: Diagnosis not present

## 2015-06-20 DIAGNOSIS — I25718 Atherosclerosis of autologous vein coronary artery bypass graft(s) with other forms of angina pectoris: Secondary | ICD-10-CM

## 2015-06-20 NOTE — Assessment & Plan Note (Signed)
Currently with no symptoms of angina. No further workup at this time. Continue current medication regimen. 

## 2015-06-20 NOTE — Assessment & Plan Note (Signed)
Blood pressure is well controlled on today's visit. No changes made to the medications. 

## 2015-06-20 NOTE — Progress Notes (Signed)
Patient ID: Clifford Aguilar, male    DOB: 02/21/1941, 74 y.o.   MRN: [id number]  HPI Comments: 74 years old with CAD, peripheral vascular disease,  bypass surgery in 1992,  carotid endarterectomy on the right with residual 60-70% disease on last ultrasound in 2013 , chronically occluded left carotid artery,  multiple surgical procedures on his lower extremity by Dr. Donnetta Hutching, left iliac stent and left renal angioplasty with Dr. Trula Slade on 12/03/2011, who presents for routine followup of his CAD, s/p CABG S/p renal stent for severe HTN. He does have significant celiac and superior mesenteric arterial disease estimated at greater than 75%. ABIs 0.5 or less bilaterally April 2013.  In followup today he reports that he is doing well. He denies any recent chest pain or shortness of breath. He is exercising on a regular basis, doing pool workouts Denies any significant shortness of breath or chest pain. Continues to have chronic leg pain with overexertion No lightheadedness or dizziness. He is otherwise active with no complaints.  seen for his PAD in [place]. Monitored by Dr.  Donnetta Hutching. They report follow-up ultrasound scheduled for September 2016  Recent lab work showing creatinine 1.34, BUN 31, total cholesterol 141, LDL 74, HDL 36  EKG on today's visit showing normal sinus rhythm with no significant ST or T-wave changes, rate 59 bpm  Other past medical history  in January 2015, he developed symptoms of "head heaviness ", with discomfort in the back of his neck He took a NTG, and subsequently his BP dropped, he had syncope, family called 911, he was taken by emt to [facility] It was felt his hypotension related to treatment with Cardura in addition to taking sublingual nitroglycerin in the standing position. He ruled out for myocardial infarction. He underwent a nuclear stress test which showed no evidence of ischemia. Echocardiogram was also unremarkable. He was discharged home on hydralazine in addition to  his other blood pressure medications. Since then he reports stopping hydralazine secondary to rash.  In fact he has numerous medications that have given him a rash   3/15 he was seen by Dr.  Donnetta Hutching Found to have renal artery stenosis that had progressed,  3/18 he had catheterization for renal artery stent placement  Previous history of blurry vision episodes.    He has not had a cardiac catheterization or stress test in 10 years. He believes he had a catheterization 10 years ago, 3 catheterizations since his bypass surgery 20 years ago.  He quit smoking after his bypass surgery in 1992.       Allergies  Allergen Reactions  . Hydralazine Rash  . Adhesive [Tape]     SKIN SENSITIVITY  . Carvedilol     Blurred visino  . Clonidine Derivatives Other (See Comments)    Caused blindness in left eye for the days on the drug  . Lisinopril Other (See Comments)    Blurred vision   . Hydrazine Yellow [Tartrazine] Itching and Rash  . Isosorbide Dinitrate Rash  . Isosorbide Mononitrate Rash    Outpatient Encounter Prescriptions as of 72/21/2016  Medication Sig  . aspirin 81 MG tablet Take 81 mg by mouth daily.   . benazepril (LOTENSIN) 20 MG tablet Take 1 tablet (20 mg total) by mouth 2 (two) times daily.  . clopidogrel (PLAVIX) 75 MG tablet Take 1 tablet (75 mg total) by mouth daily.  . Coenzyme Q10 (CO Q-10) 100 MG CAPS Take 100 mg by mouth daily.  Marland Kitchen doxazosin (CARDURA) 4 MG  tablet Take 1 tablet (4 mg total) by mouth daily.  Marland Kitchen ezetimibe (ZETIA) 10 MG tablet Take 1 tablet (10 mg total) by mouth daily.  Marland Kitchen GARLIC PO Take 1,610 mg by mouth. Takes 7 tablets daily.  . hydrochlorothiazide (HYDRODIURIL) 25 MG tablet Take 1 tablet (25 mg total) by mouth daily.  . lansoprazole (PREVACID) 15 MG capsule Take 1 capsule (15 mg total) by mouth daily at 12 noon. (Patient taking differently: Take 15 mg by mouth at bedtime. )  . levothyroxine (SYNTHROID, LEVOTHROID) 25 MCG tablet Take 25 mcg by mouth daily  before breakfast.   . nitroGLYCERIN (NITROLINGUAL) 0.4 MG/SPRAY spray Place 1 spray under the tongue as needed for chest pain.  . nortriptyline (PAMELOR) 25 MG capsule Take 75 mg by mouth at bedtime. For sleeping  . polyethylene glycol (MIRALAX / GLYCOLAX) packet Take 17 g by mouth daily.    . simvastatin (ZOCOR) 40 MG tablet Take 1 tablet (40 mg total) by mouth every evening.  . verapamil (COVERA HS) 240 MG (CO) 24 hr tablet Take 240 mg by mouth daily.    Marland Kitchen Zn-Pyg Afri-Nettle-Saw Palmet (SAW PALMETTO COMPLEX PO) Take 1,725 mg by mouth daily.    No facility-administered encounter medications on file as of 06/20/2015.    Past Medical History  Diagnosis Date  . PVD (peripheral vascular disease)     s/p multiple revascularization procedures.  . Carotid artery occlusion     s/p right carotid endarterectomy  . Coronary artery disease     s/p coronary bypass graft surgery in 1992, stable  . Hyperlipidemia   . Hypertension   . Myocardial infarction   . Shortness of breath   . Chronic kidney disease     renal artery stenosis  . Cancer     basel cell carcinoma left ear  . GERD (gastroesophageal reflux disease)     Past Surgical History  Procedure Laterality Date  . Surgery for sleep apnea    . Coronary artery bypass graft  1992  . Carotid endarterectomy      Right. With 46% stenosis in the right by recent Doppler  . Peripheral vascular revascularization     . Shoulder arthroscopy      Right Amnioplasty; Acromioclavicular joint resection and arthroscopic debridement  . Renal artery stenosis  12/03/2011  . Left common iliac  12/03/2011  . Abdomial angiogram  03-16-2014  . Renal angiogram  03-16-2014  . Abdominal aortagram N/A 12/03/2011    Procedure: ABDOMINAL Maxcine Ham;  Surgeon: Serafina Mitchell, MD;  Location: [facility] CATH LAB;  Service: Cardiovascular;  Laterality: N/A;  . Renal angiogram Left 12/03/2011    Procedure: RENAL ANGIOGRAM;  Surgeon: Serafina Mitchell, MD;  Location: [facility] CATH  LAB;  Service: Cardiovascular;  Laterality: Left;  lt renal artery stent  . Abdominal angiogram N/A 03/16/2014    Procedure: ABDOMINAL ANGIOGRAM;  Surgeon: Rosetta Posner, MD;  Location: [facility] CATH LAB;  Service: Cardiovascular;  Laterality: N/A;  . Renal angiogram N/A 03/16/2014    Procedure: RENAL ANGIOGRAM;  Surgeon: Rosetta Posner, MD;  Location: [facility] CATH LAB;  Service: Cardiovascular;  Laterality: N/A;    Social History  reports that he quit smoking about 24 years ago. His smoking use included Cigarettes. He quit smokeless tobacco use about 24 years ago. He reports that he drinks about 0.6 - 1.2 oz of alcohol per week. He reports that he does not use illicit drugs.  Family History family history includes Heart disease in his other;  Stroke in his brother, mother, and sister.  Review of Systems  Constitutional: Negative.   Respiratory: Negative.   Cardiovascular: Negative.   Gastrointestinal: Negative.   Musculoskeletal: Negative.        Claudication pain with exertion of the legs  Neurological: Negative.   Hematological: Negative.   Psychiatric/Behavioral: Negative.   All other systems reviewed and are negative.   BP 138/70 mmHg  Pulse 59  Ht 5\' 9"  (1.753 m)  Wt 183 lb 4 oz (83.122 kg)  BMI 27.05 kg/m2  Physical Exam  Constitutional: He is oriented to person, place, and time. He appears well-developed and well-nourished.  HENT:  Head: Normocephalic.  Nose: Nose normal.  Mouth/Throat: Oropharynx is clear and moist.  Eyes: Conjunctivae are normal. Pupils are equal, round, and reactive to light.  Neck: Normal range of motion. Neck supple. No JVD present. Carotid bruit is present.  Cardiovascular: Normal rate, regular rhythm, S1 normal, S2 normal and intact distal pulses.  Exam reveals no gallop and no friction rub.   No murmur heard. Pulses:      Dorsalis pedis pulses are 0 on the right side, and 0 on the left side.       Posterior tibial pulses are 0 on the right side, and 0 on  the left side.  Pulmonary/Chest: Effort normal and breath sounds normal. No respiratory distress. He has no wheezes. He has no rales. He exhibits no tenderness.  Abdominal: Soft. Bowel sounds are normal. He exhibits no distension. There is no tenderness.  Musculoskeletal: Normal range of motion. He exhibits no edema or tenderness.  Lymphadenopathy:    He has no cervical adenopathy.  Neurological: He is alert and oriented to person, place, and time. Coordination normal.  Skin: Skin is warm and dry. No rash noted. No erythema.  Psychiatric: He has a normal mood and affect. His behavior is normal. Judgment and thought content normal.      Assessment and Plan   Nursing note and vitals reviewed.

## 2015-06-20 NOTE — Assessment & Plan Note (Signed)
Cholesterol is at goal on the current lipid regimen. No changes to the medications were made. Repeat labs ordered

## 2015-06-20 NOTE — Assessment & Plan Note (Signed)
Followed by vascular surgery in [place] aggressive cholesterol management

## 2015-06-20 NOTE — Patient Instructions (Addendum)
You are doing well. No medication changes were made.  Please take orders for labs to have drawn: liver, lipid  Please call us if you have new issues that need to be addressed before your next appt.  Your physician wants you to follow-up in: 6 months.  You will receive a reminder letter in the mail two months in advance. If you don't receive a letter, please call our office to schedule the follow-up appointment.

## 2015-06-22 ENCOUNTER — Other Ambulatory Visit: Payer: Self-pay | Admitting: Cardiovascular Disease

## 2015-06-23 ENCOUNTER — Other Ambulatory Visit: Payer: Self-pay

## 2015-06-23 DIAGNOSIS — I10 Essential (primary) hypertension: Secondary | ICD-10-CM

## 2015-06-23 DIAGNOSIS — I2510 Atherosclerotic heart disease of native coronary artery without angina pectoris: Secondary | ICD-10-CM

## 2015-06-23 DIAGNOSIS — E785 Hyperlipidemia, unspecified: Secondary | ICD-10-CM

## 2015-06-23 LAB — LIPID PANEL WITH LDL/HDL RATIO
Cholesterol, Total: 133 mg/dL (ref 100–199)
HDL: 35 mg/dL — ABNORMAL LOW (ref >39)
LDL Calculated: 63 mg/dL (ref 0–99)
LDl/HDL Ratio: 1.8 ratio units (ref 0.0–3.6)
TRIGLYCERIDES: 173 mg/dL — AB (ref 0–149)
VLDL Cholesterol Cal: 35 mg/dL (ref 5–40)

## 2015-06-23 LAB — HEPATIC FUNCTION PANEL
ALT: 15 IU/L (ref 0–44)
AST: 18 IU/L (ref 0–40)
Albumin: 4.2 g/dL (ref 3.5–4.8)
Alkaline Phosphatase: 69 IU/L (ref 39–117)
BILIRUBIN TOTAL: 0.2 mg/dL (ref 0.0–1.2)
Bilirubin, Direct: 0.11 mg/dL (ref 0.00–0.40)
Total Protein: 6.3 g/dL (ref 6.0–8.5)

## 2015-06-26 ENCOUNTER — Encounter: Payer: Self-pay | Admitting: Vascular Surgery

## 2015-06-26 ENCOUNTER — Other Ambulatory Visit: Payer: Self-pay

## 2015-07-17 ENCOUNTER — Encounter: Payer: Self-pay | Admitting: Family Medicine

## 2015-07-17 ENCOUNTER — Ambulatory Visit (INDEPENDENT_AMBULATORY_CARE_PROVIDER_SITE_OTHER): Payer: Commercial Managed Care - HMO | Admitting: Family Medicine

## 2015-07-17 VITALS — BP 130/58 | HR 78 | Temp 98.0°F | Resp 16 | Wt 183.2 lb

## 2015-07-17 DIAGNOSIS — H00013 Hordeolum externum right eye, unspecified eyelid: Secondary | ICD-10-CM | POA: Diagnosis not present

## 2015-07-17 MED ORDER — ERYTHROMYCIN 5 MG/GM OP OINT: 1.0000 "application " | TOPICAL_OINTMENT | Freq: Four times a day (QID) | OPHTHALMIC | Status: DC

## 2015-07-17 NOTE — Patient Instructions (Signed)
Continue warm compresses to right eye. If not improving in a week call for referral to an eye doctor.

## 2015-07-17 NOTE — Progress Notes (Signed)
Subjective:     Patient ID: Clifford Aguilar, male   DOB: 01-May-1941, 74 y.o.   MRN: [id number]  HPI  Chief Complaint  Patient presents with  . Eye Pain    Patient comes in office today with complaints of pain on his bottom right eyelid. For the past month patient states that he has had a bump he states that he tried using warm  compress with no relief.   Has treated with warm compresses without improvement. Reports prior sty in this eye which resolved with use of warm compresses   Review of Systems  Eyes: Negative for discharge, redness and visual disturbance.       Objective:   Physical Exam  Constitutional: He appears well-developed and well-nourished. No distress.  Eyes: Right eye exhibits hordeolum ( no drainage on palpation).         Assessment:    1. Hordeolum external, right-Consider chalazion - erythromycin (ROMYCIN) ophthalmic ointment; Place 1 application into the right eye 4 (four) times daily. Apply a ribbon to right lower lid sac 3-4 x day  Dispense: 3.5 g; Refill: 0    Plan:   If not improved over the next week will refer to opthalmology.

## 2015-08-23 ENCOUNTER — Telehealth: Payer: Self-pay | Admitting: *Deleted

## 2015-08-23 ENCOUNTER — Telehealth: Payer: Self-pay

## 2015-08-23 NOTE — Telephone Encounter (Signed)
Would try an alternate medication such as aspirin 81 mg daily with brilinta 60 mg twice a day Hold the Plavix for one month, coupon for free trial of new medication See if diarrhea gets better

## 2015-08-23 NOTE — Telephone Encounter (Signed)
LMOV to return call regarding bill he received that was denied.

## 2015-08-23 NOTE — Telephone Encounter (Signed)
Pt wife called, states pt is having diarrhea, sudden bouts, she thinks it is from his Plavix. States it has been very gradual(since his stents placed last year) Episodes have gotten closer Please call.

## 2015-08-24 ENCOUNTER — Telehealth: Payer: Self-pay | Admitting: Family Medicine

## 2015-08-24 ENCOUNTER — Other Ambulatory Visit: Payer: Self-pay | Admitting: Family Medicine

## 2015-08-24 DIAGNOSIS — H0289 Other specified disorders of eyelid: Secondary | ICD-10-CM

## 2015-08-24 MED ORDER — TICAGRELOR 60 MG PO TABS: 60.0000 mg | ORAL_TABLET | Freq: Two times a day (BID) | ORAL | Status: DC

## 2015-08-24 NOTE — Telephone Encounter (Signed)
Referral to ophthalmology in progress

## 2015-08-24 NOTE — Telephone Encounter (Signed)
Spoke w/ pt's wife.  Advised her of Dr. Donivan Scull recommendation.  She is agreeable and will stop by the office today to p/u samples of Brilinta 60 mg.   She does report that pt has been taking aspirin 81 mg BID for quite some time and does not remember who advised pt to do so.  She would like to know if Dr. Rockey Situ recommends he take 1 or 2 a day.

## 2015-08-24 NOTE — Telephone Encounter (Signed)
Last ov was on 07/17/2015.  Please see below,,,  Thanks.

## 2015-08-24 NOTE — Telephone Encounter (Signed)
Advised pt's spouse a directed below, she verbalized fully understanding.  Thanks,

## 2015-08-24 NOTE — Telephone Encounter (Signed)
Pt wife, Humberto Seals called states pt is still having a problem with right eye lid.  Pt is request a referral to see a specialist.  CB#[phone]/MW

## 2015-08-26 NOTE — Telephone Encounter (Signed)
One asa 81 mg daily as detailed in last note

## 2015-08-28 NOTE — Telephone Encounter (Signed)
Left message on pt's vm w/ Dr. Gollan's recommendation.  Asked him to call back w/ any questions or concerns.  

## 2015-10-03 ENCOUNTER — Other Ambulatory Visit (HOSPITAL_COMMUNITY): Payer: Commercial Managed Care - HMO

## 2015-10-03 ENCOUNTER — Ambulatory Visit: Payer: Commercial Managed Care - HMO | Admitting: Family

## 2015-10-03 ENCOUNTER — Encounter (HOSPITAL_COMMUNITY): Payer: Commercial Managed Care - HMO

## 2015-10-17 ENCOUNTER — Encounter: Payer: Self-pay | Admitting: Family

## 2015-10-20 ENCOUNTER — Ambulatory Visit (HOSPITAL_COMMUNITY)
Admission: RE | Admit: 2015-10-20 | Discharge: 2015-10-20 | Disposition: A | Discharge status: Home / Self Care | Payer: Commercial Managed Care - HMO | Source: Ambulatory Visit | Attending: Family | Admitting: Family

## 2015-10-20 ENCOUNTER — Ambulatory Visit (INDEPENDENT_AMBULATORY_CARE_PROVIDER_SITE_OTHER)
Admission: RE | Admit: 2015-10-20 | Discharge: 2015-10-20 | Disposition: A | Discharge status: Home / Self Care | Payer: Commercial Managed Care - HMO | Source: Ambulatory Visit | Attending: Vascular Surgery | Admitting: Vascular Surgery

## 2015-10-20 ENCOUNTER — Encounter: Payer: Self-pay | Admitting: Family

## 2015-10-20 ENCOUNTER — Ambulatory Visit (INDEPENDENT_AMBULATORY_CARE_PROVIDER_SITE_OTHER): Payer: Commercial Managed Care - HMO | Admitting: Family

## 2015-10-20 VITALS — BP 122/60 | HR 67 | Temp 96.6°F | Resp 18 | Ht 69.0 in | Wt 180.0 lb

## 2015-10-20 DIAGNOSIS — Z48812 Encounter for surgical aftercare following surgery on the circulatory system: Secondary | ICD-10-CM | POA: Diagnosis not present

## 2015-10-20 DIAGNOSIS — E785 Hyperlipidemia, unspecified: Secondary | ICD-10-CM | POA: Diagnosis not present

## 2015-10-20 DIAGNOSIS — I739 Peripheral vascular disease, unspecified: Secondary | ICD-10-CM | POA: Diagnosis not present

## 2015-10-20 DIAGNOSIS — Z9889 Other specified postprocedural states: Secondary | ICD-10-CM | POA: Diagnosis not present

## 2015-10-20 DIAGNOSIS — I779 Disorder of arteries and arterioles, unspecified: Secondary | ICD-10-CM

## 2015-10-20 DIAGNOSIS — Z87891 Personal history of nicotine dependence: Secondary | ICD-10-CM | POA: Insufficient documentation

## 2015-10-20 DIAGNOSIS — Z95828 Presence of other vascular implants and grafts: Secondary | ICD-10-CM

## 2015-10-20 DIAGNOSIS — I701 Atherosclerosis of renal artery: Secondary | ICD-10-CM | POA: Diagnosis not present

## 2015-10-20 DIAGNOSIS — I10 Essential (primary) hypertension: Secondary | ICD-10-CM | POA: Diagnosis not present

## 2015-10-20 DIAGNOSIS — I6523 Occlusion and stenosis of bilateral carotid arteries: Secondary | ICD-10-CM

## 2015-10-20 NOTE — Patient Instructions (Signed)
Stroke Prevention Some medical conditions and behaviors are associated with an increased chance of having a stroke. You may prevent a stroke by making healthy choices and managing medical conditions. HOW CAN I REDUCE MY RISK OF HAVING A STROKE?   Stay physically active. Get at least 30 minutes of activity on most or all days.  Do not smoke. It may also be helpful to avoid exposure to secondhand smoke.  Limit alcohol use. Moderate alcohol use is considered to be:  No more than 2 drinks per day for men.  No more than 1 drink per day for nonpregnant women.  Eat healthy foods. This involves:  Eating 5 or more servings of fruits and vegetables a day.  Making dietary changes that address high blood pressure (hypertension), high cholesterol, diabetes, or obesity.  Manage your cholesterol levels.  Making food choices that are high in fiber and low in saturated fat, trans fat, and cholesterol may control cholesterol levels.  Take any prescribed medicines to control cholesterol as directed by your health care provider.  Manage your diabetes.  Controlling your carbohydrate and sugar intake is recommended to manage diabetes.  Take any prescribed medicines to control diabetes as directed by your health care provider.  Control your hypertension.  Making food choices that are low in salt (sodium), saturated fat, trans fat, and cholesterol is recommended to manage hypertension.  Ask your health care provider if you need treatment to lower your blood pressure. Take any prescribed medicines to control hypertension as directed by your health care provider.  If you are 18-39 years of age, have your blood pressure checked every 3-5 years. If you are 40 years of age or older, have your blood pressure checked every year.  Maintain a healthy weight.  Reducing calorie intake and making food choices that are low in sodium, saturated fat, trans fat, and cholesterol are recommended to manage  weight.  Stop drug abuse.  Avoid taking birth control pills.  Talk to your health care provider about the risks of taking birth control pills if you are over 35 years old, smoke, get migraines, or have ever had a blood clot.  Get evaluated for sleep disorders (sleep apnea).  Talk to your health care provider about getting a sleep evaluation if you snore a lot or have excessive sleepiness.  Take medicines only as directed by your health care provider.  For some people, aspirin or blood thinners (anticoagulants) are helpful in reducing the risk of forming abnormal blood clots that can lead to stroke. If you have the irregular heart rhythm of atrial fibrillation, you should be on a blood thinner unless there is a good reason you cannot take them.  Understand all your medicine instructions.  Make sure that other conditions (such as anemia or atherosclerosis) are addressed. SEEK IMMEDIATE MEDICAL CARE IF:   You have sudden weakness or numbness of the face, arm, or leg, especially on one side of the body.  Your face or eyelid droops to one side.  You have sudden confusion.  You have trouble speaking (aphasia) or understanding.  You have sudden trouble seeing in one or both eyes.  You have sudden trouble walking.  You have dizziness.  You have a loss of balance or coordination.  You have a sudden, severe headache with no known cause.  You have new chest pain or an irregular heartbeat. Any of these symptoms may represent a serious problem that is an emergency. Do not wait to see if the symptoms will   go away. Get medical help at once. Call your local emergency services (911 in [place] Do not drive yourself to the hospital.   This information is not intended to replace advice given to you by your health care provider. Make sure you discuss any questions you have with your health care provider.   Document Released: 01/23/2005 Document Revised: 01/06/2015 Document Reviewed:  06/18/2013 Elsevier Interactive Patient Education 2016 [employer]    Peripheral Vascular Disease Peripheral vascular disease (PVD) is a disease of the blood vessels that are not part of your heart and brain. A simple term for PVD is poor circulation. In most cases, PVD narrows the blood vessels that carry blood from your heart to the rest of your body. This can result in a decreased supply of blood to your arms, legs, and internal organs, like your stomach or kidneys. However, it most often affects a person's lower legs and feet. There are two types of PVD.  Organic PVD. This is the more common type. It is caused by damage to the structure of blood vessels.  Functional PVD. This is caused by conditions that make blood vessels contract and tighten (spasm). Without treatment, PVD tends to get worse over time. PVD can also lead to acute ischemic limb. This is when an arm or limb suddenly has trouble getting enough blood. This is a medical emergency. CAUSES Each type of PVD has many different causes. The most common cause of PVD is buildup of a fatty material (plaque) inside of your arteries (atherosclerosis). Small amounts of plaque can break off from the walls of the blood vessels and become lodged in a smaller artery. This blocks blood flow and can cause acute ischemic limb. Other common causes of PVD include:  Blood clots that form inside of blood vessels.  Injuries to blood vessels.  Diseases that cause inflammation of blood vessels or cause blood vessel spasms.  Health behaviors and health history that increase your risk of developing PVD. RISK FACTORS  You may have a greater risk of PVD if you:  Have a family history of PVD.  Have certain medical conditions, including:  High cholesterol.  Diabetes.  High blood pressure (hypertension).  Coronary heart disease.  Past problems with blood clots.  Past injury, such as burns or a broken bone. These may have damaged blood  vessels in your limbs.  Buerger disease. This is caused by inflamed blood vessels in your hands and feet.  Some forms of arthritis.  Rare birth defects that affect the arteries in your legs.  Use tobacco.  Do not get enough exercise.  Are obese.  Are age 50 or older. SIGNS AND SYMPTOMS  PVD may cause many different symptoms. Your symptoms depend on what part of your body is not getting enough blood. Some common signs and symptoms include:  Cramps in your lower legs. This may be a symptom of poor leg circulation (claudication).  Pain and weakness in your legs while you are physically active that goes away when you rest (intermittent claudication).  Leg pain when at rest.  Leg numbness, tingling, or weakness.  Coldness in a leg or foot, especially when compared with the other leg.  Skin or hair changes. These can include:  Hair loss.  Shiny skin.  Pale or bluish skin.  Thick toenails.  Inability to get or maintain an erection (erectile dysfunction). People with PVD are more prone to developing ulcers and sores on their toes, feet, or legs. These may take longer than   normal to heal. DIAGNOSIS Your health care provider may diagnose PVD from your signs and symptoms. The health care provider will also do a physical exam. You may have tests to find out what is causing your PVD and determine its severity. Tests may include:  Blood pressure recordings from your arms and legs and measurements of the strength of your pulses (pulse volume recordings).  Imaging studies using sound waves to take pictures of the blood flow through your blood vessels (Doppler ultrasound).  Injecting a dye into your blood vessels before having imaging studies using:  X-rays (angiogram or arteriogram).  Computer-generated X-rays (CT angiogram).  A powerful electromagnetic field and a computer (magnetic resonance angiogram or MRA). TREATMENT Treatment for PVD depends on the cause of your condition  and the severity of your symptoms. It also depends on your age. Underlying causes need to be treated and controlled. These include long-lasting (chronic) conditions, such as diabetes, high cholesterol, and high blood pressure. You may need to first try making lifestyle changes and taking medicines. Surgery may be needed if these do not work. Lifestyle changes may include:  Quitting smoking.  Exercising regularly.  Following a low-fat, low-cholesterol diet. Medicines may include:  Blood thinners to prevent blood clots.  Medicines to improve blood flow.  Medicines to improve your blood cholesterol levels. Surgical procedures may include:  A procedure that uses an inflated balloon to open a blocked artery and improve blood flow (angioplasty).  A procedure to put in a tube (stent) to keep a blocked artery open (stent implant).  Surgery to reroute blood flow around a blocked artery (peripheral bypass surgery).  Surgery to remove dead tissue from an infected wound on the affected limb.  Amputation. This is surgical removal of the affected limb. This may be necessary in cases of acute ischemic limb that are not improved through medical or surgical treatments. HOME CARE INSTRUCTIONS  Take medicines only as directed by your health care provider.  Do not use any tobacco products, including cigarettes, chewing tobacco, or electronic cigarettes. If you need help quitting, ask your health care provider.  Lose weight if you are overweight, and maintain a healthy weight as directed by your health care provider.  Eat a diet that is low in fat and cholesterol. If you need help, ask your health care provider.  Exercise regularly. Ask your health care provider to suggest some good activities for you.  Use compression stockings or other mechanical devices as directed by your health care provider.  Take good care of your feet.  Wear comfortable shoes that fit well.  Check your feet often for  any cuts or sores. SEEK MEDICAL CARE IF:  You have cramps in your legs while walking.  You have leg pain when you are at rest.  You have coldness in a leg or foot.  Your skin changes.  You have erectile dysfunction.  You have cuts or sores on your feet that are not healing. SEEK IMMEDIATE MEDICAL CARE IF:  Your arm or leg turns cold and blue.  Your arms or legs become red, warm, swollen, painful, or numb.  You have chest pain or trouble breathing.  You suddenly have weakness in your face, arm, or leg.  You become very confused or lose the ability to speak.  You suddenly have a very bad headache or lose your vision.   This information is not intended to replace advice given to you by your health care provider. Make sure you discuss any questions   you have with your health care provider.   Document Released: 01/23/2005 Document Revised: 01/06/2015 Document Reviewed: 05/26/2014 Elsevier Interactive Patient Education 2016 [employer]    Renal Artery Stenosis Renal artery stenosis (RAS) is narrowing of the artery that carries blood to your kidneys. It can affect one or both kidneys. Your kidneys filter waste and extra fluid from your blood. You get rid of the waste and fluid when you urinate. Your kidneys also make an important chemical messenger (hormone) called renin. Renin helps regulate your blood pressure. The first sign of RAS may be high blood pressure. Over time, other symptoms can develop. CAUSES  Plaque buildup in your arteries (atherosclerosis) is the main cause of RAS. The plaques that cause this are made up of:  Fat.  Cholesterol.  Calcium.  Other substances. As these substances build up in your renal artery, this slows the blood supply to your kidneys. The lack of blood and oxygen causes the signs and symptoms of RAS. A much less common cause of RAS is a disease called fibromuscular dysplasia. This disease causes abnormal cell growth that narrows the renal  artery. It is not related to atherosclerosis. It occurs mostly in women who are 15-62 years old. It may be passed down through families. RISK FACTORS  You may be at risk for renal artery stenosis if you:  Are a man who is at least 72 years old.  Are a woman who is at least 71 years old.  Have high blood pressure.  Have high cholesterol.  Are a smoker.  Abuse alcohol.  Have diabetes or prediabetes.  Are overweight.  Have a family history of early heart disease. SIGNS AND SYMPTOMS  RAS usually develops slowly. You may not have any signs or symptoms at first. The earliest signs may be:  Developing high blood pressure.  A sudden increase in existing high blood pressure.  No longer responding to medicine that used to control your blood pressure. Later signs and symptoms are due to kidney damage. They may include:  Fatigue.  Shortness of breath.  Swollen legs and feet.  Dry skin.  Headaches.  Muscle cramps.  Loss of appetite.  Nausea or vomiting. DIAGNOSIS  Your health care provider may suspect RAS based on changes in your blood pressure and your risk factors. A physical exam will be done. Your health care provider may use a stethoscope to listen for a whooshing sound (bruit) that can occur where the renal artery is blocking blood flow. Several tests may be done to confirm a diagnosis of RAS. These may include:  Blood and urine tests to check your kidney function.  Imaging tests of your kidneys, such as:  A test that involves using sound waves to create an image of your kidneys and the blood flow to your kidneys (ultrasound).  A test in which dye is injected into one of your blood vessels so images can be taken as the dye flows through your renal arteries (angiogram). These tests can be done using X-rays, a CT scan (computed tomography angiogram, CTA), or a type of MRI (magnetic resonance angiogram, MRA). TREATMENT  Making lifestyle changes to reduce your risk  factors is the first treatment option for early RAS. If the blood flow to one of your kidneys is cut by more than half, you may need medicine to:  Lower your blood pressure. This is the main medical treatment for RAS. You may need more than one type of medicine for this. The two types that  work best for RAS are:  ACE inhibitors.  Angiotensin receptor blockers.  Reduce fluid in the body (diuretics).  Lower your cholesterol (statins). If medicine is not enough to control RAS, you may need surgery. This may involve:  Threading a tube with an inflatable balloon into the renal artery to force it open (angioplasty).  Removing plaque from inside the artery (endarterectomy). HOME CARE INSTRUCTIONS  Take medicines only as directed by your health care provider.  Make any lifestyle changes recommended by your health care provider. This may include:  Working with a dietitian to maintain a heart-healthy diet. This type of diet is low in saturated fat, salt, and added sugar.  Starting an exercise program as directed by your health care provider.  Maintaining a healthy weight.  Quitting smoking.  Not abusing alcohol.  Keep all follow-up visits as directed by your health care provider. This is important. SEEK MEDICAL CARE IF:  Your symptoms of RAS are not getting better.  Your symptoms are changing or getting worse. SEEK IMMEDIATE MEDICAL CARE IF:  You have very bad pain in your back or abdomen.  You have blood in your urine.   This information is not intended to replace advice given to you by your health care provider. Make sure you discuss any questions you have with your health care provider.   Document Released: 09/11/2005 Document Revised: 01/06/2015 Document Reviewed: 03/31/2014 Elsevier Interactive Patient Education [employer].

## 2015-10-20 NOTE — Progress Notes (Signed)
[facility] [place] HISTORY AND PHYSICAL   MRN : [id number]  History of Present Illness:   Clifford Aguilar is a 74 y.o. male patient of Dr. Donnetta Hutching who returns today for followup of diffuse peripheral vascular occlusive disease. He is status post prior right carotid endarterectomy in 1993; he has a known occlusion of his left internal carotid artery. He is status post bilateral renal artery stenting. Most recently his right renal artery stenitng was in March 2015. He is status post left iliac artery stenting as well. He looks quite good today. He is here with his wife.  He does have claudication but is able to walk and do his routine activities despite this. He denies any neurologic deficits. He has been stable from a cardiac standpoint. He reports that he has some hypertension blood pressure is rarely greater than 010 systolic Pt denies any history of stroke or TIA, denies dizziness, denies steal symptoms in either hand/arm.  He has not been exercising much until this last week, walking on his treadmill 15 minutes and stationary bike 15 minutes, 4 days/week, swims once/week.  After 5 minutes walking both calves start to hurt, relieved by rest.  He denies non healing wounds.    Pt Diabetic: No Pt smoker: former smoker, quit in 1992  Pt meds include: Statin :Yes Betablocker: No ASA: Yes Other anticoagulants/antiplatelets: Plavix   Current Outpatient Prescriptions  Medication Sig Dispense Refill  . aspirin 81 MG tablet Take 81 mg by mouth daily.     . benazepril (LOTENSIN) 20 MG tablet Take 1 tablet (20 mg total) by mouth 2 (two) times daily. 180 tablet 4  . Coenzyme Q10 (CO Q-10) 100 MG CAPS Take 100 mg by mouth daily.    Marland Kitchen doxazosin (CARDURA) 4 MG tablet Take 1 tablet (4 mg total) by mouth daily. 90 tablet 4  . ezetimibe (ZETIA) 10 MG tablet Take 1 tablet (10 mg total) by mouth daily. 90 tablet 3  . GARLIC PO Take 9,323 mg by mouth. Takes 7 tablets daily.    .  hydrochlorothiazide (HYDRODIURIL) 25 MG tablet Take 1 tablet (25 mg total) by mouth daily. 90 tablet 3  . lansoprazole (PREVACID) 15 MG capsule Take 1 capsule (15 mg total) by mouth daily at 12 noon. (Patient taking differently: Take 15 mg by mouth at bedtime. ) 30 capsule 6  . levothyroxine (SYNTHROID, LEVOTHROID) 25 MCG tablet Take 25 mcg by mouth daily before breakfast.     . nitroGLYCERIN (NITROLINGUAL) 0.4 MG/SPRAY spray Place 1 spray under the tongue as needed for chest pain.    . nortriptyline (PAMELOR) 25 MG capsule Take 75 mg by mouth at bedtime. For sleeping    . polyethylene glycol (MIRALAX / GLYCOLAX) packet Take 17 g by mouth daily.      . simvastatin (ZOCOR) 40 MG tablet Take 1 tablet (40 mg total) by mouth every evening. 90 tablet 3  . verapamil (COVERA HS) 240 MG (CO) 24 hr tablet Take 240 mg by mouth daily.      Marland Kitchen Zn-Pyg Afri-Nettle-Saw Palmet (SAW PALMETTO COMPLEX PO) Take 1,725 mg by mouth daily.     Marland Kitchen erythromycin [facility]) ophthalmic ointment Place 1 application into the right eye 4 (four) times daily. Apply a ribbon to right lower lid sac 3-4 x day (Patient not taking: Reported on 10/20/2015) 3.5 g 0  . ticagrelor (BRILINTA) 60 MG TABS tablet Take 1 tablet (60 mg total) by mouth 2 (two) times daily. (Patient not  taking: Reported on 10/20/2015) 180 tablet 3   No current facility-administered medications for this visit.    Past Medical History  Diagnosis Date  . PVD (peripheral vascular disease) ([place])     s/p multiple revascularization procedures.  . Carotid artery occlusion     s/p right carotid endarterectomy  . Coronary artery disease     s/p coronary bypass graft surgery in 1992, stable  . Hyperlipidemia   . Hypertension   . Myocardial infarction ([place])   . Shortness of breath   . Chronic kidney disease     renal artery stenosis  . Cancer ([place])     basel cell carcinoma left ear  . GERD (gastroesophageal reflux disease)     Social History Social History   Substance Use Topics  . Smoking status: Former Smoker    Types: Cigarettes    Quit date: 12/30/1990  . Smokeless tobacco: Former Systems developer    Quit date: 01/10/1991     Comment: quit smoking in 1992  . Alcohol Use: 0.6 - 1.2 oz/week    1-2 Glasses of wine per week     Comment: 1-2 cans of beer per week    Family History Family History  Problem Relation Age of Onset  . Heart disease Other   . Stroke Mother   . Stroke Sister   . Stroke Brother     Surgical History Past Surgical History  Procedure Laterality Date  . Surgery for sleep apnea    . Coronary artery bypass graft  1992  . Carotid endarterectomy      Right. With 46% stenosis in the right by recent Doppler  . Peripheral vascular revascularization     . Shoulder arthroscopy      Right Amnioplasty; Acromioclavicular joint resection and arthroscopic debridement  . Renal artery stenosis  12/03/2011  . Left common iliac  12/03/2011  . Abdomial angiogram  03-16-2014  . Renal angiogram  03-16-2014  . Abdominal aortagram N/A 12/03/2011    Procedure: ABDOMINAL Maxcine Ham;  Surgeon: Serafina Mitchell, MD;  Location: [facility] CATH LAB;  Service: Cardiovascular;  Laterality: N/A;  . Renal angiogram Left 12/03/2011    Procedure: RENAL ANGIOGRAM;  Surgeon: Serafina Mitchell, MD;  Location: [facility] CATH LAB;  Service: Cardiovascular;  Laterality: Left;  lt renal artery stent  . Abdominal angiogram N/A 03/16/2014    Procedure: ABDOMINAL ANGIOGRAM;  Surgeon: Rosetta Posner, MD;  Location: [facility] CATH LAB;  Service: Cardiovascular;  Laterality: N/A;  . Renal angiogram N/A 03/16/2014    Procedure: RENAL ANGIOGRAM;  Surgeon: Rosetta Posner, MD;  Location: [facility] CATH LAB;  Service: Cardiovascular;  Laterality: N/A;    Allergies  Allergen Reactions  . Hydralazine Rash  . Adhesive [Tape]     SKIN SENSITIVITY  . Carvedilol     Blurred visino  . Clonidine Derivatives Other (See Comments)    Caused blindness in left eye for the days on the drug  . Lisinopril Other  (See Comments)    Blurred vision   . Hydrazine Yellow [Tartrazine] Itching and Rash  . Isosorbide Dinitrate Rash  . Isosorbide Mononitrate Rash    Current Outpatient Prescriptions  Medication Sig Dispense Refill  . aspirin 81 MG tablet Take 81 mg by mouth daily.     . benazepril (LOTENSIN) 20 MG tablet Take 1 tablet (20 mg total) by mouth 2 (two) times daily. 180 tablet 4  . Coenzyme Q10 (CO Q-10) 100 MG CAPS Take 100 mg by mouth daily.    Marland Kitchen  doxazosin (CARDURA) 4 MG tablet Take 1 tablet (4 mg total) by mouth daily. 90 tablet 4  . ezetimibe (ZETIA) 10 MG tablet Take 1 tablet (10 mg total) by mouth daily. 90 tablet 3  . GARLIC PO Take 5,093 mg by mouth. Takes 7 tablets daily.    . hydrochlorothiazide (HYDRODIURIL) 25 MG tablet Take 1 tablet (25 mg total) by mouth daily. 90 tablet 3  . lansoprazole (PREVACID) 15 MG capsule Take 1 capsule (15 mg total) by mouth daily at 12 noon. (Patient taking differently: Take 15 mg by mouth at bedtime. ) 30 capsule 6  . levothyroxine (SYNTHROID, LEVOTHROID) 25 MCG tablet Take 25 mcg by mouth daily before breakfast.     . nitroGLYCERIN (NITROLINGUAL) 0.4 MG/SPRAY spray Place 1 spray under the tongue as needed for chest pain.    . nortriptyline (PAMELOR) 25 MG capsule Take 75 mg by mouth at bedtime. For sleeping    . polyethylene glycol (MIRALAX / GLYCOLAX) packet Take 17 g by mouth daily.      . simvastatin (ZOCOR) 40 MG tablet Take 1 tablet (40 mg total) by mouth every evening. 90 tablet 3  . verapamil (COVERA HS) 240 MG (CO) 24 hr tablet Take 240 mg by mouth daily.      Marland Kitchen Zn-Pyg Afri-Nettle-Saw Palmet (SAW PALMETTO COMPLEX PO) Take 1,725 mg by mouth daily.     Marland Kitchen erythromycin [facility]) ophthalmic ointment Place 1 application into the right eye 4 (four) times daily. Apply a ribbon to right lower lid sac 3-4 x day (Patient not taking: Reported on 10/20/2015) 3.5 g 0  . ticagrelor (BRILINTA) 60 MG TABS tablet Take 1 tablet (60 mg total) by mouth 2 (two) times  daily. (Patient not taking: Reported on 10/20/2015) 180 tablet 3   No current facility-administered medications for this visit.     REVIEW OF SYSTEMS: See HPI for pertinent positives and negatives.  Physical Examination Filed Vitals:   10/20/15 1104  BP: 122/60  Pulse: 67  Temp: 96.6 F (35.9 C)  Resp: 18  Height: 5\' 9"  (1.753 m)  Weight: 180 lb (81.647 kg)  SpO2: 96%   Body mass index is 26.57 kg/(m^2).  General:  WDWN in NAD Gait: Normal HENT: WNL Eyes: Pupils equal Pulmonary: normal non-labored breathing , without Rales, rhonchi, or wheezing Cardiac: RRR, no murmur detected  Abdomen: soft, NT, no masses palpated Skin: no rashes, no ulcers; no cellulitis.   VASCULAR EXAM  Carotid Bruits Right Left   Positive Positive        Aorta is not palpable Radial pulses are 1+ palpable right, and 2+ palpable left                     VASCULAR EXAM: Extremities without ischemic changes, without Gangrene; without open wounds.                                                                                                          LE Pulses Right Left       FEMORAL  1+ palpable  1+ palpable        POPLITEAL  not palpable   not palpable       POSTERIOR TIBIAL  not palpable   not palpable        DORSALIS PEDIS      ANTERIOR TIBIAL not palpable  not palpable     Musculoskeletal: no muscle wasting or atrophy; no peripheral edema  Neurologic: A&O X 3; Appropriate Affect ;  SENSATION: normal; MOTOR FUNCTION: 5/5 Symmetric, CN 2-12 intact Speech is fluent/normal    Non-Invasive Vascular Imaging(10/20/2015):   CEREBROVASCULAR DUPLEX EVALUATION    INDICATION: Carotid artery disease    PREVIOUS INTERVENTION(S): Right carotid endarterectomy 1993 Known occlusion of the left internal carotid artery    DUPLEX EXAM: Carotid duplex    RIGHT  LEFT  Peak Systolic Velocities (cm/s) End Diastolic Velocities (cm/s) Plaque LOCATION Peak Systolic Velocities (cm/s) End  Diastolic Velocities (cm/s) Plaque  120 19 - CCA PROXIMAL 89 9 -  108 17 HT CCA MID 55 9 HT  117 24 HT CCA DISTAL 38 9 HT  305 27 - ECA 599 180 -  248 40 CP ICA PROXIMAL 0 - Occluded  210 52 - ICA MID 0 - -  109 24 - ICA DISTAL 0 - -    N/A ICA / CCA Ratio (PSV) N/A  Occluded Vertebral Flow Antegrade  811 Brachial Systolic Pressure (mmHg) 914  Triphasic Brachial Artery Waveforms Triphasic    Plaque Morphology:  HM = Homogeneous, HT = Heterogeneous, CP = Calcific Plaque, SP = Smooth Plaque, IP = Irregular Plaque     ADDITIONAL FINDINGS: Right subclavian artery 207 cm/s (plaque), left subclavian artery 305 cm/s (plaque).    IMPRESSION: 1. 40 - 59% right restenosis of the right carotid endarterectomy site; plaque is diffuse and calcific which may obscure higher velocity. 2. Known left internal carotid artery occlusion 3. Bilaeral  external carotid artery stenosis. 4. No flow noted in the right vertebral artery. 5. Antegrade left vertebral artery.    Compared to the previous exam:  Increased velocity on the right    RENAL ARTERY DUPLEX EVALUATION    INDICATION: Follow up bilateral renal artery stents    PREVIOUS INTERVENTION(S): Right renal artery stent placed 02/2014, left renal artery stent placed 11/2011    DUPLEX EXAM:     AORTA Peak Systolic Velocity (cm/s): 72    RIGHT  LEFT   Peak Systolic Velocities (cm/s) Comments  Peak Systolic Velocities (cm/s) Comments  160  Renal Artery Origin/Proximal 257   327  Renal Artery Mid 277   206  Renal Artery Distal 266   -  Accessory Renal Artery (when present) -   4.5  Renal / Aortic Ratio (RAR) 3.8  11.3 Kidney Size (cm) 10.9  pulsatile  Renal Vein pulsatile    Peak Systolic Velocities (cm/s) Comments  Renal Artery Stent (  ) Peak Systolic Velocities (cm/s) Comments  not  Proximal Not   visualized  Mid visualized     Distal      ADDITIONAL FINDINGS: 3.2 x 3.3 left renal cyst    IMPRESSION: 1. Technically difficult exam,  the bilateral cysts are not visualized 2. Velocities on the right suggest 60 - 99% stenosis range 3. Velocity on the left suggests at least 60% stenosis.    Compared to the previous exam:  No significant change, limited visualization    ABI (Date: 10/20/2015)  R: 0.51 (0.48, 09/20/14), DP: monophasic, PT: monophasic, TBI: 0.33  L: 0.45 (0.46), DP:  monophasic, PT: monophasic, TBI: 0.09   ASSESSMENT:  Clifford Aguilar is a 74 y.o. male who is status post prior right carotid endarterectomy in 1993; he has a known occlusion of his left internal carotid artery. He is status post bilateral renal artery stenting. Most recently his right renal artery stenitng was in March 2015. He is status post left iliac artery stenting as well. He has moderate claudication in both calves, no tissue loss. He has no history of stroke or TIA. His blood pressure remains normal.  Today's carotid duplex suggests 40-59% right ICA stenosis and known left ICA occlusion which is stable from a year ago.  Renal artery duplex suggests a technically difficult exam, the bilateral renal cysts are not visualized. Velocities on the right suggest 60 - 99% stenosis range Velocity on the left suggests at least 60% stenosis. No significant change from a year ago, limited visualization.  ABI's remain stable with moderate disease in the right leg and severe in the left leg.  Fortunately he does not have DM and he quit smoking in 1992.  Face to face time with patient was 25 minutes. Over 50% of this time was spent on counseling and coordination of care.   PLAN:   Graduated walking program discussed and explained. Based on today's exam and non-invasive vascular lab results, the patient will follow up in 1 year with the following tests: ABI's, bilateral renal artery duplex, carotid duplex. I discussed in depth with the patient the nature of atherosclerosis, and emphasized the importance of maximal medical management including  strict control of blood pressure, blood glucose, and lipid levels, obtaining regular exercise, and cessation of smoking.  The patient is aware that without maximal medical management the underlying atherosclerotic disease process will progress, limiting the benefit of any interventions.  The patient was given information about stroke prevention and what symptoms should prompt the patient to seek immediate medical care.  The patient was given information about PAD including signs, symptoms, treatment, what symptoms should prompt the patient to seek immediate medical care, and risk reduction measures to take. Thank you for allowing [place] to participate in this patient's care.  Clemon Chambers, RN, MSN, FNP-C [facility] Office: [phone]  Clinic MD: Early  10/20/2015 11:42 AM

## 2015-10-23 NOTE — Addendum Note (Signed)
Addended by: Dorthula Rue L on: 10/23/2015 11:01 AM   Modules accepted: Orders

## 2015-11-13 ENCOUNTER — Other Ambulatory Visit: Payer: Self-pay

## 2015-11-13 NOTE — Telephone Encounter (Signed)
Pt is almost out.

## 2015-11-14 MED ORDER — BENAZEPRIL HCL 20 MG PO TABS: 20.0000 mg | ORAL_TABLET | Freq: Two times a day (BID) | ORAL | Status: DC

## 2015-11-21 ENCOUNTER — Telehealth: Payer: Self-pay | Admitting: *Deleted

## 2015-11-21 MED ORDER — DOXAZOSIN MESYLATE 4 MG PO TABS: 4.0000 mg | ORAL_TABLET | Freq: Every day | ORAL | Status: DC

## 2015-11-21 NOTE — Telephone Encounter (Signed)
°*  STAT* If patient is at the pharmacy, call can be transferred to refill team.   1. Which medications need to be refilled? (please list name of each medication and dose if known) Doxazosin    2. Which pharmacy/location (including street and city if local pharmacy) is medication to be sent to? [facility]    3. Do they need a 30 day or 90 day supply? 90 day

## 2015-11-21 NOTE — Telephone Encounter (Signed)
Pt's Rx was sent to pt's pharmacy as requested. Confirmation received.  °

## 2015-12-18 ENCOUNTER — Ambulatory Visit: Payer: Medicare HMO | Admitting: Cardiovascular Disease

## 2016-01-08 DIAGNOSIS — C44219 Basal cell carcinoma of skin of left ear and external auricular canal: Secondary | ICD-10-CM | POA: Diagnosis not present

## 2016-01-18 ENCOUNTER — Other Ambulatory Visit: Payer: Self-pay | Admitting: *Deleted

## 2016-01-18 DIAGNOSIS — I739 Peripheral vascular disease, unspecified: Secondary | ICD-10-CM

## 2016-01-18 MED ORDER — CLOPIDOGREL BISULFATE 75 MG PO TABS: 75.0000 mg | ORAL_TABLET | Freq: Every day | ORAL | Status: DC

## 2016-01-22 ENCOUNTER — Ambulatory Visit (INDEPENDENT_AMBULATORY_CARE_PROVIDER_SITE_OTHER): Payer: Medicare HMO | Admitting: Cardiovascular Disease

## 2016-01-22 ENCOUNTER — Encounter: Payer: Self-pay | Admitting: Cardiovascular Disease

## 2016-01-22 VITALS — BP 120/58 | HR 73 | Ht 69.0 in | Wt 184.5 lb

## 2016-01-22 DIAGNOSIS — I10 Essential (primary) hypertension: Secondary | ICD-10-CM

## 2016-01-22 DIAGNOSIS — I25718 Atherosclerosis of autologous vein coronary artery bypass graft(s) with other forms of angina pectoris: Secondary | ICD-10-CM

## 2016-01-22 DIAGNOSIS — E785 Hyperlipidemia, unspecified: Secondary | ICD-10-CM

## 2016-01-22 DIAGNOSIS — I2510 Atherosclerotic heart disease of native coronary artery without angina pectoris: Secondary | ICD-10-CM

## 2016-01-22 DIAGNOSIS — I739 Peripheral vascular disease, unspecified: Secondary | ICD-10-CM | POA: Diagnosis not present

## 2016-01-22 MED ORDER — DOXAZOSIN MESYLATE 4 MG PO TABS: 4.0000 mg | ORAL_TABLET | Freq: Every day | ORAL | Status: DC

## 2016-01-22 MED ORDER — BENAZEPRIL HCL 20 MG PO TABS: 20.0000 mg | ORAL_TABLET | Freq: Two times a day (BID) | ORAL | Status: DC

## 2016-01-22 MED ORDER — SIMVASTATIN 40 MG PO TABS: 40.0000 mg | ORAL_TABLET | Freq: Every evening | ORAL | Status: DC

## 2016-01-22 MED ORDER — EZETIMIBE 10 MG PO TABS: 10.0000 mg | ORAL_TABLET | Freq: Every day | ORAL | Status: DC

## 2016-01-22 MED ORDER — HYDROCHLOROTHIAZIDE 25 MG PO TABS: 25.0000 mg | ORAL_TABLET | Freq: Every day | ORAL | Status: DC

## 2016-01-22 MED ORDER — VERAPAMIL HCL ER 240 MG PO TBCR: 240.0000 mg | EXTENDED_RELEASE_TABLET | Freq: Every day | ORAL | Status: DC

## 2016-01-22 NOTE — Progress Notes (Signed)
Patient ID: Clifford Aguilar, male    DOB: 07-Feb-1941, 75 y.o.   MRN: [id number]  HPI Comments: 75 years old with CAD, peripheral vascular disease,  bypass surgery in 1992,  carotid endarterectomy on the right with residual 60-70% disease on last ultrasound in 2013 , chronically occluded left carotid artery,  multiple surgical procedures on his lower extremity by Dr. Donnetta Hutching, left iliac stent and left renal angioplasty with Dr. Trula Slade on 12/03/2011, who presents for routine followup of his CAD, s/p CABG S/p renal stent for severe HTN. He does have significant celiac and superior mesenteric arterial disease estimated at greater than 75%. ABIs 0.5 or less bilaterally April 2013.  In followup today he reports that he is doing well.  He denies any recent chest pain or shortness of breath. He is exercising on a regular basis. chronic leg pain with overexertion He is otherwise active with no complaints.  seen for his PAD in [place]. Monitored by Dr.  Donnetta Hutching.   Reports that he would like to stay on the combination of simvastatin and verapamil despite letter from the insurance company suggesting to switch simvastatin  Recent lab work reviewed with him in detail total cholesterol 133, LDL 60  EKG on today's visit showing normal sinus rhythm with no significant ST or T-wave changes, rate 73 bpm  Other past medical history reviewed  in January 2015, he developed symptoms of "head heaviness ", with discomfort in the back of his neck He took a NTG, and subsequently his BP dropped, he had syncope, family called 911, he was taken by emt to [facility] It was felt his hypotension related to treatment with Cardura in addition to taking sublingual nitroglycerin in the standing position. He ruled out for myocardial infarction. He underwent a nuclear stress test which showed no evidence of ischemia. Echocardiogram was also unremarkable. He was discharged home on hydralazine in addition to his other blood pressure  medications. Since then he reports stopping hydralazine secondary to rash.  In fact he has numerous medications that have given him a rash   3/15 he was seen by Dr.  Donnetta Hutching Found to have renal artery stenosis that had progressed,  3/18 he had catheterization for renal artery stent placement  Previous history of blurry vision episodes.    He has not had a cardiac catheterization or stress test in 10 years. He believes he had a catheterization 10 years ago, 3 catheterizations since his bypass surgery 20 years ago.  He quit smoking after his bypass surgery in 1992.       Allergies  Allergen Reactions  . Hydralazine Rash  . Adhesive [Tape]     SKIN SENSITIVITY  . Carvedilol     Blurred visino  . Clonidine Derivatives Other (See Comments)    Caused blindness in left eye for the days on the drug  . Lisinopril Other (See Comments)    Blurred vision   . Hydrazine Yellow [Tartrazine] Itching and Rash  . Isosorbide Dinitrate Rash  . Isosorbide Mononitrate Rash    Outpatient Encounter Prescriptions as of 01/22/2016  Medication Sig  . aspirin 81 MG tablet Take 81 mg by mouth daily.   . benazepril (LOTENSIN) 20 MG tablet Take 1 tablet (20 mg total) by mouth 2 (two) times daily.  . clopidogrel (PLAVIX) 75 MG tablet Take 1 tablet (75 mg total) by mouth daily.  . Coenzyme Q10 (CO Q-10) 100 MG CAPS Take 100 mg by mouth daily.  Marland Kitchen doxazosin (CARDURA) 4 MG tablet  Take 1 tablet (4 mg total) by mouth daily.  Marland Kitchen ezetimibe (ZETIA) 10 MG tablet Take 1 tablet (10 mg total) by mouth daily.  Marland Kitchen GARLIC PO Take [id number] mg by mouth. Takes 7 tablets daily.  . hydrochlorothiazide (HYDRODIURIL) 25 MG tablet Take 1 tablet (25 mg total) by mouth daily.  . lansoprazole (PREVACID) 15 MG capsule Take 1 capsule (15 mg total) by mouth daily at 12 noon. (Patient taking differently: Take 15 mg by mouth at bedtime. )  . levothyroxine (SYNTHROID, LEVOTHROID) 25 MCG tablet Take 25 mcg by mouth daily before breakfast.   .  nitroGLYCERIN (NITROLINGUAL) 0.4 MG/SPRAY spray Place 1 spray under the tongue as needed for chest pain.  . nortriptyline (PAMELOR) 25 MG capsule Take 75 mg by mouth at bedtime. For sleeping  . polyethylene glycol (MIRALAX / GLYCOLAX) packet Take 17 g by mouth daily.    . simvastatin (ZOCOR) 40 MG tablet Take 1 tablet (40 mg total) by mouth every evening.  . verapamil (COVERA HS) 240 MG (CO) 24 hr tablet Take 240 mg by mouth daily.    Marland Kitchen Zn-Pyg Afri-Nettle-Saw Palmet (SAW PALMETTO COMPLEX PO) Take 1,725 mg by mouth daily.   . [DISCONTINUED] simvastatin (ZOCOR) 40 MG tablet Take 1 tablet (40 mg total) by mouth every evening.  . verapamil (CALAN-SR) 240 MG CR tablet Take 1 tablet (240 mg total) by mouth at bedtime.  . [DISCONTINUED] erythromycin [facility]) ophthalmic ointment Place 1 application into the right eye 4 (four) times daily. Apply a ribbon to right lower lid sac 3-4 x day (Patient not taking: Reported on 01/22/2016)  . [DISCONTINUED] ticagrelor (BRILINTA) 60 MG TABS tablet Take 1 tablet (60 mg total) by mouth 2 (two) times daily. (Patient not taking: Reported on 01/22/2016)   No facility-administered encounter medications on file as of 01/22/2016.    Past Medical History  Diagnosis Date  . PVD (peripheral vascular disease) ([place])     s/p multiple revascularization procedures.  . Carotid artery occlusion     s/p right carotid endarterectomy  . Coronary artery disease     s/p coronary bypass graft surgery in 1992, stable  . Hyperlipidemia   . Hypertension   . Myocardial infarction ([place])   . Shortness of breath   . Chronic kidney disease     renal artery stenosis  . Cancer ([place])     basel cell carcinoma left ear  . GERD (gastroesophageal reflux disease)     Past Surgical History  Procedure Laterality Date  . Surgery for sleep apnea    . Coronary artery bypass graft  1992  . Carotid endarterectomy      Right. With 46% stenosis in the right by recent Doppler  . Peripheral  vascular revascularization     . Shoulder arthroscopy      Right Amnioplasty; Acromioclavicular joint resection and arthroscopic debridement  . Renal artery stenosis  12/03/2011  . Left common iliac  12/03/2011  . Abdomial angiogram  03-16-2014  . Renal angiogram  03-16-2014  . Abdominal aortagram N/A 12/03/2011    Procedure: ABDOMINAL Maxcine Ham;  Surgeon: Serafina Mitchell, MD;  Location: [facility] CATH LAB;  Service: Cardiovascular;  Laterality: N/A;  . Renal angiogram Left 12/03/2011    Procedure: RENAL ANGIOGRAM;  Surgeon: Serafina Mitchell, MD;  Location: [facility] CATH LAB;  Service: Cardiovascular;  Laterality: Left;  lt renal artery stent  . Abdominal angiogram N/A 03/16/2014    Procedure: ABDOMINAL ANGIOGRAM;  Surgeon: Rosetta Posner, MD;  Location: [facility]  CATH LAB;  Service: Cardiovascular;  Laterality: N/A;  . Renal angiogram N/A 03/16/2014    Procedure: RENAL ANGIOGRAM;  Surgeon: Rosetta Posner, MD;  Location: [facility] CATH LAB;  Service: Cardiovascular;  Laterality: N/A;    Social History  reports that he quit smoking about 25 years ago. His smoking use included Cigarettes. He quit smokeless tobacco use about 25 years ago. He reports that he drinks about 0.6 - 1.2 oz of alcohol per week. He reports that he does not use illicit drugs.  Family History family history includes Heart disease in his other; Stroke in his brother, mother, and sister.  Review of Systems  Constitutional: Negative.   Respiratory: Negative.   Cardiovascular: Negative.   Gastrointestinal: Negative.   Musculoskeletal: Negative.        Claudication pain with exertion of the legs  Neurological: Negative.   Hematological: Negative.   Psychiatric/Behavioral: Negative.   All other systems reviewed and are negative.   BP 120/58 mmHg  Pulse 73  Ht 5\' 9"  (1.753 m)  Wt 184 lb 8 oz (83.689 kg)  BMI 27.23 kg/m2  Physical Exam  Constitutional: He is oriented to person, place, and time. He appears well-developed and well-nourished.  HENT:   Head: Normocephalic.  Nose: Nose normal.  Mouth/Throat: Oropharynx is clear and moist.  Eyes: Conjunctivae are normal. Pupils are equal, round, and reactive to light.  Neck: Normal range of motion. Neck supple. No JVD present. Carotid bruit is present.  Cardiovascular: Normal rate, regular rhythm, S1 normal, S2 normal and intact distal pulses.  Exam reveals no gallop and no friction rub.   No murmur heard. Pulses:      Dorsalis pedis pulses are 0 on the right side, and 0 on the left side.       Posterior tibial pulses are 0 on the right side, and 0 on the left side.  Pulmonary/Chest: Effort normal and breath sounds normal. No respiratory distress. He has no wheezes. He has no rales. He exhibits no tenderness.  Abdominal: Soft. Bowel sounds are normal. He exhibits no distension. There is no tenderness.  Musculoskeletal: Normal range of motion. He exhibits no edema or tenderness.  Lymphadenopathy:    He has no cervical adenopathy.  Neurological: He is alert and oriented to person, place, and time. Coordination normal.  Skin: Skin is warm and dry. No rash noted. No erythema.  Psychiatric: He has a normal mood and affect. His behavior is normal. Judgment and thought content normal.      Assessment and Plan   Nursing note and vitals reviewed.

## 2016-01-22 NOTE — Patient Instructions (Signed)
You are doing well. No medication changes were made.  Please call us if you have new issues that need to be addressed before your next appt.  Your physician wants you to follow-up in: 6 months.  You will receive a reminder letter in the mail two months in advance. If you don't receive a letter, please call our office to schedule the follow-up appointment.   

## 2016-01-22 NOTE — Assessment & Plan Note (Signed)
Bilateral carotid disease, monitored in [place] Stressed importance of aggressive cholesterol levels

## 2016-01-22 NOTE — Assessment & Plan Note (Signed)
Cholesterol is at goal on the current lipid regimen. No changes to the medications were made.  

## 2016-01-22 NOTE — Assessment & Plan Note (Signed)
Blood pressure is well controlled on today's visit. No changes made to the medications. 

## 2016-01-22 NOTE — Assessment & Plan Note (Signed)
Currently with no symptoms of angina. No further workup at this time. Continue current medication regimen. 

## 2016-01-24 ENCOUNTER — Encounter: Payer: Self-pay | Admitting: Family Medicine

## 2016-01-24 ENCOUNTER — Telehealth: Payer: Self-pay

## 2016-01-24 NOTE — Telephone Encounter (Signed)
Pt spouse called, states Zetia rx is $495. Would like generic. [place]

## 2016-01-25 MED ORDER — EZETIMIBE 10 MG PO TABS: 10.0000 mg | ORAL_TABLET | Freq: Every day | ORAL | Status: DC

## 2016-01-25 NOTE — Telephone Encounter (Signed)
Generic rx sent in to [facility].

## 2016-01-29 ENCOUNTER — Encounter: Payer: Self-pay | Admitting: Family Medicine

## 2016-01-29 ENCOUNTER — Ambulatory Visit (INDEPENDENT_AMBULATORY_CARE_PROVIDER_SITE_OTHER): Payer: Medicare HMO | Admitting: Family Medicine

## 2016-01-29 ENCOUNTER — Ambulatory Visit: Payer: Medicare HMO | Admitting: Cardiovascular Disease

## 2016-01-29 VITALS — BP 124/62 | HR 88 | Temp 97.8°F | Resp 16 | Ht 69.0 in | Wt 185.2 lb

## 2016-01-29 DIAGNOSIS — I6523 Occlusion and stenosis of bilateral carotid arteries: Secondary | ICD-10-CM | POA: Diagnosis not present

## 2016-01-29 DIAGNOSIS — I2510 Atherosclerotic heart disease of native coronary artery without angina pectoris: Secondary | ICD-10-CM

## 2016-01-29 DIAGNOSIS — K219 Gastro-esophageal reflux disease without esophagitis: Secondary | ICD-10-CM | POA: Insufficient documentation

## 2016-01-29 DIAGNOSIS — E039 Hypothyroidism, unspecified: Secondary | ICD-10-CM | POA: Insufficient documentation

## 2016-01-29 DIAGNOSIS — N138 Other obstructive and reflux uropathy: Secondary | ICD-10-CM

## 2016-01-29 DIAGNOSIS — I739 Peripheral vascular disease, unspecified: Secondary | ICD-10-CM

## 2016-01-29 DIAGNOSIS — Z23 Encounter for immunization: Secondary | ICD-10-CM

## 2016-01-29 DIAGNOSIS — I701 Atherosclerosis of renal artery: Secondary | ICD-10-CM | POA: Diagnosis not present

## 2016-01-29 DIAGNOSIS — G4700 Insomnia, unspecified: Secondary | ICD-10-CM | POA: Insufficient documentation

## 2016-01-29 DIAGNOSIS — Z0000 Encounter for general adult medical examination without abnormal findings: Secondary | ICD-10-CM | POA: Diagnosis not present

## 2016-01-29 DIAGNOSIS — N401 Enlarged prostate with lower urinary tract symptoms: Secondary | ICD-10-CM

## 2016-01-29 DIAGNOSIS — E785 Hyperlipidemia, unspecified: Secondary | ICD-10-CM | POA: Diagnosis not present

## 2016-01-29 DIAGNOSIS — I10 Essential (primary) hypertension: Secondary | ICD-10-CM | POA: Diagnosis not present

## 2016-01-29 DIAGNOSIS — Z1211 Encounter for screening for malignant neoplasm of colon: Secondary | ICD-10-CM

## 2016-01-29 DIAGNOSIS — M25511 Pain in right shoulder: Secondary | ICD-10-CM

## 2016-01-29 MED ORDER — TRAZODONE HCL 50 MG PO TABS: 50.0000 mg | ORAL_TABLET | Freq: Every evening | ORAL | Status: DC | PRN

## 2016-01-29 NOTE — Patient Instructions (Addendum)
Please get labs fasting. Will call you about appointments and lab results. Try trazodone for insomnia.

## 2016-01-29 NOTE — Progress Notes (Signed)
Subjective:     Patient ID: Clifford Aguilar, male   DOB: June 12, 1941, 75 y.o.   MRN: [id number]  HPI  Chief Complaint  Patient presents with  . Medicare Wellness    Patient comes in office today for his annual physical he has no questions or conerns today.   Followed regularly by several consultants: Dr. Rockey Situ, cardiology; Dr. Manley Mason, dermatology; Dr. Donnetta Hutching, cardiovascular; and Dr. Paulla Dolly podiatry.    Review of Systems General: Feeling well HEENT: Pending dental exam. Eye exam annually Cardiovascular: no chest pain, shortness of breath, or palpitations GI: no heartburn-controlled with Prevacid; no change in bowel habits or blood in the stool. He is due this year for 5 year colonoscopy f/u GU: nocturia x 2-3 no change in bladder habits  Neuro: occasionally can not recall names but does not get lost driving or leave food on the stove-defers cognitive screen. States he is not able to get back to sleep after getting up for urination. Reports nortriptyline 75 mg. Not helping him anymore. Psychiatric: not depressed Musculoskeletal: Right shoulder pain intermittently at [facility] joint. Concerned about bone spurs due to hx of left shoulder surgery.    Objective:   Physical Exam  Constitutional: He appears well-developed and well-nourished. No distress.  Eyes: PERRLA, EOMI Neck: no thyromegaly, tenderness or nodules, carotids palpable bilaterally ENT: TM's intact without inflammation; No tonsillar enlargement or exudate, Lungs: Clear Heart : RRR without murmur or gallop Abd: bowel sounds present, soft, non-tender, no organomegaly Rectal: Prostate firm and non-tender Extremities: no edema Skin: left crusting lesion on left ear pending treatment per dermatology.     Assessment:    1. Medicare annual wellness visit, subsequent  2. Right shoulder pain - DG Shoulder Right; Future  3. Atherosclerosis of native coronary artery of native heart without angina pectoris: per cardiology  4. PAD  (peripheral artery disease) ([place]): per vascular surgery  5. Essential hypertension - Comprehensive metabolic panel  6. Hypothyroidism, unspecified hypothyroidism type - TSH  7. Insomnia - traZODone (DESYREL) 50 MG tablet; Take 1 tablet (50 mg total) by mouth at bedtime as needed for sleep.  Dispense: 14 tablet; Refill: 0  8. Hyperlipidemia: well controlled per June labs.  9. Screen for colon cancer - Ambulatory referral to Gastroenterology  10. Need for vaccine for TD (tetanus-diphtheria) - Td : Tetanus/diphtheria >7yo Preservative  free  11. Renal artery stenosis [facility]): per vascular surgery  12. Carotid artery stenosis, bilateral: Per vascular suergery   13. Benign prostatic hyperplasia with urinary obstruction; continue doxazosin - PSA  14. Gastroesophageal reflux disease without esophagitis:  Continue Prevacid as needed    Plan:   Further F/u pending x-ray and lab work.

## 2016-01-30 ENCOUNTER — Ambulatory Visit
Admission: RE | Admit: 2016-01-30 | Discharge: 2016-01-30 | Disposition: A | Discharge status: Home / Self Care | Payer: Medicare HMO | Source: Ambulatory Visit | Attending: Family Medicine | Admitting: Family Medicine

## 2016-01-30 ENCOUNTER — Telehealth: Payer: Self-pay

## 2016-01-30 DIAGNOSIS — M25511 Pain in right shoulder: Secondary | ICD-10-CM | POA: Insufficient documentation

## 2016-01-30 DIAGNOSIS — I708 Atherosclerosis of other arteries: Secondary | ICD-10-CM | POA: Insufficient documentation

## 2016-01-30 DIAGNOSIS — Z9889 Other specified postprocedural states: Secondary | ICD-10-CM | POA: Insufficient documentation

## 2016-01-30 DIAGNOSIS — G8929 Other chronic pain: Secondary | ICD-10-CM | POA: Insufficient documentation

## 2016-01-30 DIAGNOSIS — Z951 Presence of aortocoronary bypass graft: Secondary | ICD-10-CM | POA: Diagnosis not present

## 2016-01-30 DIAGNOSIS — I10 Essential (primary) hypertension: Secondary | ICD-10-CM | POA: Diagnosis not present

## 2016-01-30 DIAGNOSIS — E039 Hypothyroidism, unspecified: Secondary | ICD-10-CM | POA: Diagnosis not present

## 2016-01-30 DIAGNOSIS — N401 Enlarged prostate with lower urinary tract symptoms: Secondary | ICD-10-CM | POA: Diagnosis not present

## 2016-01-30 NOTE — Telephone Encounter (Signed)
Patient has been advised of x-ray report. KW

## 2016-01-30 NOTE — Telephone Encounter (Signed)
-----   Message from Carmon Ginsberg, [place] sent at 01/30/2016  9:54 AM EST ----- Arthritic changes noted. May take Tylenol for discomfort up to 3000 mg/ day.

## 2016-01-31 ENCOUNTER — Telehealth: Payer: Self-pay

## 2016-01-31 LAB — PSA: PROSTATE SPECIFIC AG, SERUM: 0.9 ng/mL (ref 0.0–4.0)

## 2016-01-31 LAB — COMPREHENSIVE METABOLIC PANEL
A/G RATIO: 1.6 (ref 1.1–2.5)
ALT: 18 IU/L (ref 0–44)
AST: 20 IU/L (ref 0–40)
Albumin: 4.1 g/dL (ref 3.5–4.8)
Alkaline Phosphatase: 70 IU/L (ref 39–117)
BILIRUBIN TOTAL: 0.4 mg/dL (ref 0.0–1.2)
BUN/Creatinine Ratio: 20 (ref 10–22)
BUN: 20 mg/dL (ref 8–27)
CALCIUM: 9.6 mg/dL (ref 8.6–10.2)
CO2: 23 mmol/L (ref 18–29)
CREATININE: 0.98 mg/dL (ref 0.76–1.27)
Chloride: 102 mmol/L (ref 96–106)
GFR calc Af Amer: 89 mL/min/{1.73_m2} (ref >59)
GFR calc non Af Amer: 77 mL/min/{1.73_m2} (ref >59)
GLOBULIN, TOTAL: 2.5 g/dL (ref 1.5–4.5)
Glucose: 105 mg/dL — ABNORMAL HIGH (ref 65–99)
Potassium: 4.3 mmol/L (ref 3.5–5.2)
SODIUM: 141 mmol/L (ref 134–144)
Total Protein: 6.6 g/dL (ref 6.0–8.5)

## 2016-01-31 LAB — TSH: TSH: 4.14 u[IU]/mL (ref 0.450–4.500)

## 2016-01-31 NOTE — Telephone Encounter (Signed)
Spoke with patient on the phone and advised of lab report, he states that prescription for Trazodone caused him not to sleep at all. Patient d/c Trazodone and started his old prescription for Nortriptyline. Patient reports he noticed some improvement on Nortriptyline but states he still is only averaging 3-4 hrs of sleep at night. Please advise. KW

## 2016-01-31 NOTE — Telephone Encounter (Signed)
[id number] 

## 2016-01-31 NOTE — Telephone Encounter (Signed)
-----   Message from Carmon Ginsberg, [place] sent at 01/31/2016  7:40 AM EST ----- Labs look ok including thyroid and prostate blood test. Sugar is mildly elevated so stay active (exercise 30 minutes daily).

## 2016-01-31 NOTE — Telephone Encounter (Signed)
Before giving up on trazodone-try two pills 30 minutes before bedtime.

## 2016-01-31 NOTE — Telephone Encounter (Signed)
Patient has been advised. KW 

## 2016-02-02 ENCOUNTER — Other Ambulatory Visit: Payer: Self-pay

## 2016-02-02 ENCOUNTER — Other Ambulatory Visit: Payer: Self-pay | Admitting: Family Medicine

## 2016-02-02 ENCOUNTER — Telehealth: Payer: Self-pay | Admitting: Family Medicine

## 2016-02-02 ENCOUNTER — Telehealth: Payer: Self-pay

## 2016-02-02 DIAGNOSIS — E039 Hypothyroidism, unspecified: Secondary | ICD-10-CM

## 2016-02-02 DIAGNOSIS — K5900 Constipation, unspecified: Secondary | ICD-10-CM

## 2016-02-02 MED ORDER — LEVOTHYROXINE SODIUM 25 MCG PO TABS: 25.0000 ug | ORAL_TABLET | Freq: Every day | ORAL | Status: DC

## 2016-02-02 MED ORDER — POLYETHYLENE GLYCOL 3350 17 G PO PACK: 17.0000 g | PACK | Freq: Every day | ORAL | Status: DC

## 2016-02-02 NOTE — Telephone Encounter (Signed)
Gastroenterology Pre-Procedure Review  Request Date:  Requesting Physician: Carmon Ginsberg, PA  PATIENT REVIEW QUESTIONS: The patient responded to the following health history questions as indicated:    1. Are you having any GI issues? no 2. Do you have a personal history of Polyps? no 3. Do you have a family history of Colon Cancer or Polyps? no 4. Diabetes Mellitus? no 5. Joint replacements in the past 12 months?no 6. Major health problems in the past 3 months?no 7. Any artificial heart valves, MVP, or defibrillator? yes (Stent in legs and kidney 2015)    MEDICATIONS & ALLERGIES:    Patient reports the following regarding taking any anticoagulation/antiplatelet therapy:   Plavix, Coumadin, Eliquis, Xarelto, Lovenox, Pradaxa, Brilinta, or Effient? Plavix 75mg  Aspirin? yes (ASA 81mg )  Patient confirms/reports the following medications:  Current Outpatient Prescriptions  Medication Sig Dispense Refill  . aspirin 81 MG tablet Take 81 mg by mouth daily.     . benazepril (LOTENSIN) 20 MG tablet Take 1 tablet (20 mg total) by mouth 2 (two) times daily. 180 tablet 2  . clopidogrel (PLAVIX) 75 MG tablet Take 1 tablet (75 mg total) by mouth daily. 90 tablet 3  . Coenzyme Q10 (CO Q-10) 100 MG CAPS Take 100 mg by mouth daily.    Marland Kitchen doxazosin (CARDURA) 4 MG tablet Take 1 tablet (4 mg total) by mouth daily. 90 tablet 3  . ezetimibe (ZETIA) 10 MG tablet Take 1 tablet (10 mg total) by mouth daily. 30 tablet 6  . GARLIC PO Take [id number] mg by mouth. Takes 7 tablets daily.    . hydrochlorothiazide (HYDRODIURIL) 25 MG tablet Take 1 tablet (25 mg total) by mouth daily. 90 tablet 3  . lansoprazole (PREVACID) 15 MG capsule Take 1 capsule (15 mg total) by mouth daily at 12 noon. (Patient taking differently: Take 15 mg by mouth at bedtime. ) 30 capsule 6  . levothyroxine (SYNTHROID, LEVOTHROID) 25 MCG tablet Take 25 mcg by mouth daily before breakfast.     . nitroGLYCERIN (NITROLINGUAL) 0.4 MG/SPRAY spray  Place 1 spray under the tongue as needed for chest pain.    . polyethylene glycol (MIRALAX / GLYCOLAX) packet Take 17 g by mouth daily.      . simvastatin (ZOCOR) 40 MG tablet Take 1 tablet (40 mg total) by mouth every evening. 90 tablet 3  . traZODone (DESYREL) 50 MG tablet Take 1 tablet (50 mg total) by mouth at bedtime as needed for sleep. 14 tablet 0  . verapamil (CALAN-SR) 240 MG CR tablet Take 1 tablet (240 mg total) by mouth at bedtime. 90 tablet 3  . Zn-Pyg Afri-Nettle-Saw Palmet (SAW PALMETTO COMPLEX PO) Take 1,725 mg by mouth daily.      No current facility-administered medications for this visit.    Patient confirms/reports the following allergies:  Allergies  Allergen Reactions  . Hydralazine Rash  . Adhesive [Tape]     SKIN SENSITIVITY  . Carvedilol     Blurred visino  . Clonidine Derivatives Other (See Comments)    Caused blindness in left eye for the days on the drug  . Lisinopril Other (See Comments)    Blurred vision   . Hydrazine Yellow [Tartrazine] Itching and Rash  . Isosorbide Dinitrate Rash  . Isosorbide Mononitrate Rash    No orders of the defined types were placed in this encounter.    AUTHORIZATION INFORMATION Primary Insurance: 1D#: Group #:  Secondary Insurance: 1D#: Group #:  SCHEDULE INFORMATION: Date: 03/12/16 Time: Location: [place]

## 2016-02-02 NOTE — Telephone Encounter (Signed)
Pt's wife stated that pt didn't sleep last night and the over the counter medication hasn't helped. Please advise. Thanks TNP

## 2016-02-02 NOTE — Telephone Encounter (Signed)
Does not get sedated with trazodone 50-100 mg. Wishes to try Nortriptyline at 100 mg. Dose (previously on 75 mg.) Will call me how he does with this. I have apprised him of likelihood of dry mouth/ constipation at higher doses.

## 2016-02-02 NOTE — Telephone Encounter (Signed)
Patient had called yesterday ( see previous message) he was advised to continue on medication and see if that shows signs of improvement.Please advise if patient should continue as instructed yesterday. KW

## 2016-02-08 ENCOUNTER — Other Ambulatory Visit: Payer: Self-pay | Admitting: Family Medicine

## 2016-02-08 DIAGNOSIS — G4700 Insomnia, unspecified: Secondary | ICD-10-CM

## 2016-02-08 MED ORDER — NORTRIPTYLINE HCL 25 MG PO CAPS: ORAL_CAPSULE | ORAL | Status: DC

## 2016-03-01 ENCOUNTER — Other Ambulatory Visit: Payer: Self-pay | Admitting: Family Medicine

## 2016-03-01 ENCOUNTER — Encounter: Payer: Self-pay | Admitting: Family Medicine

## 2016-03-01 ENCOUNTER — Ambulatory Visit (INDEPENDENT_AMBULATORY_CARE_PROVIDER_SITE_OTHER): Payer: Medicare HMO | Admitting: Family Medicine

## 2016-03-01 VITALS — BP 118/50 | HR 64 | Temp 97.9°F | Resp 16 | Wt 180.8 lb

## 2016-03-01 DIAGNOSIS — R197 Diarrhea, unspecified: Secondary | ICD-10-CM

## 2016-03-01 DIAGNOSIS — G4700 Insomnia, unspecified: Secondary | ICD-10-CM

## 2016-03-01 MED ORDER — NORTRIPTYLINE HCL 25 MG PO CAPS: ORAL_CAPSULE | ORAL | Status: DC

## 2016-03-01 NOTE — Progress Notes (Signed)
Subjective:     Patient ID: Clifford Aguilar, male   DOB: 26-Nov-1941, 75 y.o.   MRN: [id number]  HPI  Chief Complaint  Patient presents with  . Diarrhea    Patient comes in office today with complaints of diarrhea and lower abdominal cramping for the past 12 days. Patient reports that he is always gettick sick only after dinner, patient has tried otc Imodium and nausea prescription. Patient stated that he passed 7 watery stools yesterday and has lost a total of 8lbs  States he initially had nausea and vomiting which has abated. Reports diarrhea stopped two days ago. He was concerned about his chronic constipation and took a Miralax dose. Diarrhea resumed along with cramping last night. States he feels fine during the day and is eating normally. Wife who accompanies him is not ill. No recent antibiotic use. His screening colonoscopy is pending 3/14.   Review of Systems     Objective:   Physical Exam  Constitutional: He appears well-developed and well-nourished. No distress.  Abdominal: Soft. Bowel sounds are normal. There is no tenderness.       Assessment:    1. Diarrhea, unspecified type: suspect spontaneously resolving. Will get assay if loose stools return over the weekend - Stool C-Diff Toxin Assay    Plan:    Stop MIralax for now. Wait 3-4 days after diarrhea stops to resume.

## 2016-03-01 NOTE — Patient Instructions (Addendum)
Stop Miralax  for now. Get stool sample if symptoms persist.

## 2016-03-04 DIAGNOSIS — R197 Diarrhea, unspecified: Secondary | ICD-10-CM | POA: Diagnosis not present

## 2016-03-06 ENCOUNTER — Telehealth: Payer: Self-pay | Admitting: Gastroenterology

## 2016-03-06 ENCOUNTER — Telehealth: Payer: Self-pay

## 2016-03-06 LAB — CLOSTRIDIUM DIFFICILE EIA: C difficile Toxins A+B, EIA: NEGATIVE

## 2016-03-06 NOTE — Telephone Encounter (Signed)
Patient advised of lab report. Symptoms got worse Monday, wife advised patient to d/c Simvastatin, she states since then there has been no G.I upset. Patient is scheduled for colonoscopy Tuesday 3/14

## 2016-03-06 NOTE — Telephone Encounter (Signed)
-----   Message from Carmon Ginsberg, [place] sent at 03/06/2016  7:43 AM EST ----- Stool test ok. Is he feeling better?

## 2016-03-06 NOTE — Telephone Encounter (Signed)
Advised pt the generic name for Plavix is Clopidogrel. Pt was notified today per Dr. Donnetta Hutching to stop Plavix 75mg  7 days prior to procedure and restart 7 days after procedure. Pt had already taken medication today. Pt will stop tomorrow.

## 2016-03-06 NOTE — Telephone Encounter (Signed)
Patients wife has a question regarding the plavix and is it called something else?

## 2016-03-12 ENCOUNTER — Encounter
Admission: RE | Disposition: A | Discharge status: Home / Self Care | Payer: Self-pay | Source: Ambulatory Visit | Attending: Gastroenterology

## 2016-03-12 ENCOUNTER — Ambulatory Visit: Payer: Medicare HMO | Admitting: Anesthesiology

## 2016-03-12 ENCOUNTER — Ambulatory Visit
Admission: RE | Admit: 2016-03-12 | Discharge: 2016-03-12 | Disposition: A | Discharge status: Home / Self Care | Payer: Medicare HMO | Source: Ambulatory Visit | Attending: Gastroenterology | Admitting: Gastroenterology

## 2016-03-12 DIAGNOSIS — I701 Atherosclerosis of renal artery: Secondary | ICD-10-CM | POA: Diagnosis not present

## 2016-03-12 DIAGNOSIS — Z7901 Long term (current) use of anticoagulants: Secondary | ICD-10-CM | POA: Insufficient documentation

## 2016-03-12 DIAGNOSIS — Z87891 Personal history of nicotine dependence: Secondary | ICD-10-CM | POA: Diagnosis not present

## 2016-03-12 DIAGNOSIS — Z7982 Long term (current) use of aspirin: Secondary | ICD-10-CM | POA: Diagnosis not present

## 2016-03-12 DIAGNOSIS — Z91048 Other nonmedicinal substance allergy status: Secondary | ICD-10-CM | POA: Diagnosis not present

## 2016-03-12 DIAGNOSIS — D122 Benign neoplasm of ascending colon: Secondary | ICD-10-CM | POA: Insufficient documentation

## 2016-03-12 DIAGNOSIS — Z1211 Encounter for screening for malignant neoplasm of colon: Secondary | ICD-10-CM | POA: Diagnosis not present

## 2016-03-12 DIAGNOSIS — Z823 Family history of stroke: Secondary | ICD-10-CM | POA: Diagnosis not present

## 2016-03-12 DIAGNOSIS — Z860100 Personal history of colon polyps, unspecified: Secondary | ICD-10-CM | POA: Insufficient documentation

## 2016-03-12 DIAGNOSIS — K219 Gastro-esophageal reflux disease without esophagitis: Secondary | ICD-10-CM | POA: Diagnosis not present

## 2016-03-12 DIAGNOSIS — Z888 Allergy status to other drugs, medicaments and biological substances status: Secondary | ICD-10-CM | POA: Diagnosis not present

## 2016-03-12 DIAGNOSIS — K5790 Diverticulosis of intestine, part unspecified, without perforation or abscess without bleeding: Secondary | ICD-10-CM | POA: Diagnosis not present

## 2016-03-12 DIAGNOSIS — K5730 Diverticulosis of large intestine without perforation or abscess without bleeding: Secondary | ICD-10-CM | POA: Diagnosis not present

## 2016-03-12 DIAGNOSIS — Z79899 Other long term (current) drug therapy: Secondary | ICD-10-CM | POA: Diagnosis not present

## 2016-03-12 DIAGNOSIS — I2510 Atherosclerotic heart disease of native coronary artery without angina pectoris: Secondary | ICD-10-CM | POA: Insufficient documentation

## 2016-03-12 DIAGNOSIS — I129 Hypertensive chronic kidney disease with stage 1 through stage 4 chronic kidney disease, or unspecified chronic kidney disease: Secondary | ICD-10-CM | POA: Diagnosis not present

## 2016-03-12 DIAGNOSIS — I739 Peripheral vascular disease, unspecified: Secondary | ICD-10-CM | POA: Insufficient documentation

## 2016-03-12 DIAGNOSIS — I252 Old myocardial infarction: Secondary | ICD-10-CM | POA: Insufficient documentation

## 2016-03-12 DIAGNOSIS — Z09 Encounter for follow-up examination after completed treatment for conditions other than malignant neoplasm: Secondary | ICD-10-CM | POA: Diagnosis not present

## 2016-03-12 DIAGNOSIS — K635 Polyp of colon: Secondary | ICD-10-CM | POA: Diagnosis not present

## 2016-03-12 DIAGNOSIS — Z9889 Other specified postprocedural states: Secondary | ICD-10-CM | POA: Diagnosis not present

## 2016-03-12 DIAGNOSIS — K633 Ulcer of intestine: Secondary | ICD-10-CM | POA: Diagnosis not present

## 2016-03-12 DIAGNOSIS — K641 Second degree hemorrhoids: Secondary | ICD-10-CM | POA: Insufficient documentation

## 2016-03-12 DIAGNOSIS — Z85828 Personal history of other malignant neoplasm of skin: Secondary | ICD-10-CM | POA: Diagnosis not present

## 2016-03-12 DIAGNOSIS — Z951 Presence of aortocoronary bypass graft: Secondary | ICD-10-CM | POA: Insufficient documentation

## 2016-03-12 DIAGNOSIS — E785 Hyperlipidemia, unspecified: Secondary | ICD-10-CM | POA: Insufficient documentation

## 2016-03-12 DIAGNOSIS — I10 Essential (primary) hypertension: Secondary | ICD-10-CM | POA: Diagnosis not present

## 2016-03-12 DIAGNOSIS — Z8249 Family history of ischemic heart disease and other diseases of the circulatory system: Secondary | ICD-10-CM | POA: Insufficient documentation

## 2016-03-12 DIAGNOSIS — N189 Chronic kidney disease, unspecified: Secondary | ICD-10-CM | POA: Diagnosis not present

## 2016-03-12 HISTORY — PX: COLONOSCOPY WITH PROPOFOL: SHX5780

## 2016-03-12 SURGERY — COLONOSCOPY WITH PROPOFOL
Anesthesia: General

## 2016-03-12 MED ORDER — MIDAZOLAM HCL 2 MG/2ML IJ SOLN
INTRAMUSCULAR | Status: DC | PRN
Start: 2016-03-12 — End: 2016-03-12
  Administered 2016-03-12: 1 mg via INTRAVENOUS

## 2016-03-12 MED ORDER — SODIUM CHLORIDE 0.9 % IV SOLN
INTRAVENOUS | Status: DC
  Administered 2016-03-12: 1000 mL via INTRAVENOUS

## 2016-03-12 MED ORDER — PROPOFOL 500 MG/50ML IV EMUL
INTRAVENOUS | Status: DC | PRN
  Administered 2016-03-12: 100 ug/kg/min via INTRAVENOUS

## 2016-03-12 MED ORDER — PROPOFOL 10 MG/ML IV BOLUS
INTRAVENOUS | Status: DC | PRN
  Administered 2016-03-12: 40 mg via INTRAVENOUS

## 2016-03-12 NOTE — Op Note (Signed)
[facility] Patient Name: Clifford Aguilar Procedure Date: 03/12/2016 11:18 AM MRN: [id number] Account #: [id number] Date of Birth: [birth year] Admit Type: Outpatient Age: 75 Room: [facility] ENDO ROOM 4 Gender: Male Note Status: Finalized Procedure:            Colonoscopy Indications:          High risk colon cancer surveillance: Personal history                        of colonic polyps Providers:            [name], MD Referring MD:         Janine Ores. Rosanna Randy, MD (Referring MD) Medicines:            Propofol per Anesthesia Complications:        No immediate complications. Procedure:            Pre-Anesthesia Assessment:                       - Prior to the procedure, a History and Physical was                        performed, and patient medications and allergies were                        reviewed. The patient's tolerance of previous                        anesthesia was also reviewed. The risks and benefits of                        the procedure and the sedation options and risks were                        discussed with the patient. All questions were                        answered, and informed consent was obtained. Prior                        Anticoagulants: The patient has taken no previous                        anticoagulant or antiplatelet agents. ASA Grade                        Assessment: II - A patient with mild systemic disease.                        After reviewing the risks and benefits, the patient was                        deemed in satisfactory condition to undergo the                        procedure.                       After obtaining informed consent, the colonoscope was  passed under direct vision. Throughout the procedure,                        the patient's blood pressure, pulse, and oxygen                        saturations were monitored continuously. The                        Colonoscope  was introduced through the anus and                        advanced to the the cecum, identified by appendiceal                        orifice and ileocecal valve. The colonoscopy was                        performed without difficulty. The patient tolerated the                        procedure well. The quality of the bowel preparation                        was excellent. Findings:      The perianal and digital rectal examinations were normal.      A 4 mm polyp was found in the ascending colon. The polyp was sessile.       The polyp was removed with a cold biopsy forceps. Resection and       retrieval were complete.      Discontinuous areas of nonbleeding ulcerated mucosa with no stigmata of       recent bleeding were present in the transverse colon. Biopsies were       taken with a cold forceps for histology.      Non-bleeding internal hemorrhoids were found during retroflexion. The       hemorrhoids were Grade II (internal hemorrhoids that prolapse but reduce       spontaneously).      A few small-mouthed diverticula were found in the entire colon. Impression:           - One 4 mm polyp in the ascending colon, removed with a                        cold biopsy forceps. Resected and retrieved.                       - Mucosal ulceration. Biopsied.                       - Non-bleeding internal hemorrhoids.                       - Diverticulosis in the entire examined colon. Recommendation:       - Repeat colonoscopy in 5 years for surveillance. Procedure Code(s):    --- Professional ---                       [phone], Colonoscopy, flexible; with biopsy, single or  multiple Diagnosis Code(s):    --- Professional ---                       Z86.010, Personal history of colonic polyps                       D12.2, Benign neoplasm of ascending colon CPT copyright 2016 American Medical Association. All rights reserved. The codes documented in this report are preliminary and  upon coder review may  be revised to meet current compliance requirements. [name], MD 03/12/2016 11:39:59 AM This report has been signed electronically. Number of Addenda: 0 Note Initiated On: 03/12/2016 11:18 AM Scope Withdrawal Time: 0 hours 6 minutes 32 seconds  Total Procedure Duration: 0 hours 11 minutes 8 seconds       [facility]

## 2016-03-12 NOTE — H&P (Signed)
[facility]  [address], [place], [place] [address] Phone: [phone] Fax : [phone]  Primary Care Physician:  Wilhemena Durie, MD Primary Gastroenterologist:  Dr. Allen Norris  Pre-Procedure History & Physical: HPI:  Clifford Aguilar is a 75 y.o. male is here for an colonoscopy.   Past Medical History  Diagnosis Date  . PVD (peripheral vascular disease) ([place])     s/p multiple revascularization procedures.  . Carotid artery occlusion     s/p right carotid endarterectomy  . Coronary artery disease     s/p coronary bypass graft surgery in 1992, stable  . Hyperlipidemia   . Hypertension   . Myocardial infarction ([place])   . Shortness of breath   . Chronic kidney disease     renal artery stenosis  . Cancer ([place])     basel cell carcinoma left ear  . GERD (gastroesophageal reflux disease)     Past Surgical History  Procedure Laterality Date  . Surgery for sleep apnea    . Coronary artery bypass graft  1992  . Carotid endarterectomy      Right. With 46% stenosis in the right by recent Doppler  . Peripheral vascular revascularization     . Shoulder arthroscopy      Right Amnioplasty; Acromioclavicular joint resection and arthroscopic debridement  . Renal artery stenosis  12/03/2011  . Left common iliac  12/03/2011  . Abdomial angiogram  03-16-2014  . Renal angiogram  03-16-2014  . Abdominal aortagram N/A 12/03/2011    Procedure: ABDOMINAL Maxcine Ham;  Surgeon: Serafina Mitchell, MD;  Location: [facility] CATH LAB;  Service: Cardiovascular;  Laterality: N/A;  . Renal angiogram Left 12/03/2011    Procedure: RENAL ANGIOGRAM;  Surgeon: Serafina Mitchell, MD;  Location: [facility] CATH LAB;  Service: Cardiovascular;  Laterality: Left;  lt renal artery stent  . Abdominal angiogram N/A 03/16/2014    Procedure: ABDOMINAL ANGIOGRAM;  Surgeon: Rosetta Posner, MD;  Location: [facility] CATH LAB;  Service: Cardiovascular;  Laterality: N/A;  . Renal angiogram N/A 03/16/2014    Procedure: RENAL  ANGIOGRAM;  Surgeon: Rosetta Posner, MD;  Location: [facility] CATH LAB;  Service: Cardiovascular;  Laterality: N/A;    Prior to Admission medications   Medication Sig Start Date End Date Taking? Authorizing Provider  clopidogrel (PLAVIX) 75 MG tablet Take 1 tablet (75 mg total) by mouth daily. 01/18/16  Yes Rosetta Posner, MD  aspirin 81 MG tablet Take 81 mg by mouth daily.     Historical Provider, MD  benazepril (LOTENSIN) 20 MG tablet Take 1 tablet (20 mg total) by mouth 2 (two) times daily. 01/22/16   Minna Merritts, MD  Coenzyme Q10 (CO Q-10) 100 MG CAPS Take 100 mg by mouth daily.    Historical Provider, MD  doxazosin (CARDURA) 4 MG tablet Take 1 tablet (4 mg total) by mouth daily. 01/22/16   Minna Merritts, MD  ezetimibe (ZETIA) 10 MG tablet Take 1 tablet (10 mg total) by mouth daily. 01/25/16   Minna Merritts, MD  GARLIC PO Take [id number] mg by mouth. Takes 7 tablets daily.    Historical Provider, MD  hydrochlorothiazide (HYDRODIURIL) 25 MG tablet Take 1 tablet (25 mg total) by mouth daily. 01/22/16   Minna Merritts, MD  lansoprazole (PREVACID) 15 MG capsule Take 1 capsule (15 mg total) by mouth daily at 12 noon. Patient taking differently: Take 15 mg by mouth at bedtime.  01/06/14   Minna Merritts, MD  levothyroxine (SYNTHROID,  LEVOTHROID) 25 MCG tablet Take 1 tablet (25 mcg total) by mouth daily before breakfast. 02/02/16   Carmon Ginsberg, PA  nitroGLYCERIN (NITROLINGUAL) 0.4 MG/SPRAY spray Place 1 spray under the tongue as needed for chest pain.    Historical Provider, MD  nortriptyline (PAMELOR) 25 MG capsule Four capsules at bedtime 03/01/16   Carmon Ginsberg, PA  polyethylene glycol [facility] / GLYCOLAX) packet Take 17 g by mouth daily. 02/02/16   Carmon Ginsberg, PA  simvastatin (ZOCOR) 40 MG tablet Take 1 tablet (40 mg total) by mouth every evening. 01/22/16   Minna Merritts, MD  verapamil (CALAN-SR) 240 MG CR tablet Take 1 tablet (240 mg total) by mouth at bedtime. 01/22/16   Minna Merritts, MD    Zn-Pyg Afri-Nettle-Saw Palmet (SAW PALMETTO COMPLEX PO) Take 1,725 mg by mouth daily.     Historical Provider, MD    Allergies as of 02/02/2016 - Review Complete 01/29/2016  Allergen Reaction Noted  . Hydralazine Rash 03/16/2014  . Adhesive [tape]    . Carvedilol  01/27/2014  . Clonidine derivatives Other (See Comments) 11/27/2011  . Lisinopril Other (See Comments) 06/29/2012  . Hydrazine yellow [tartrazine] Itching and Rash 03/03/2014  . Isosorbide dinitrate Rash 03/03/2014  . Isosorbide mononitrate Rash 11/26/2011    Family History  Problem Relation Age of Onset  . Heart disease Other   . Stroke Mother   . Stroke Sister   . Stroke Brother     Social History   Social History  . Marital Status: Married    Spouse Name: N/A  . Number of Children: N/A  . Years of Education: N/A   Occupational History  . Not on file.   Social History Main Topics  . Smoking status: Former Smoker    Types: Cigarettes    Quit date: 12/30/1990  . Smokeless tobacco: Former Systems developer    Quit date: 01/10/1991     Comment: quit smoking in 1992  . Alcohol Use: 0.6 - 1.2 oz/week    1-2 Glasses of wine per week     Comment: 1-2 cans of beer per week  . Drug Use: No  . Sexual Activity: Yes   Other Topics Concern  . Not on file   Social History Narrative    Review of Systems: See HPI, otherwise negative ROS  Physical Exam: BP 159/76 mmHg  Pulse 63  Temp(Src) 97.2 F (36.2 C) (Tympanic)  Resp 15  Ht 5\' 9"  (1.753 m)  SpO2 100% General:   Alert,  pleasant and cooperative in NAD Head:  Normocephalic and atraumatic. Neck:  Supple; no masses or thyromegaly. Lungs:  Clear throughout to auscultation.    Heart:  Regular rate and rhythm. Abdomen:  Soft, nontender and nondistended. Normal bowel sounds, without guarding, and without rebound.   Neurologic:  Alert and  oriented x4;  grossly normal neurologically.  Impression/Plan: [name] is here for an colonoscopy to be performed for  history of colon polyps  Risks, benefits, limitations, and alternatives regarding  colonoscopy have been reviewed with the patient.  Questions have been answered.  All parties agreeable.   Ollen Bowl, MD  03/12/2016, 11:11 AM

## 2016-03-12 NOTE — Transfer of Care (Signed)
Immediate Anesthesia Transfer of Care Note  Patient: Clifford Aguilar  Procedure(s) Performed: Procedure(s): COLONOSCOPY WITH PROPOFOL (N/A)  Patient Location: PACU  Anesthesia Type:General  Level of Consciousness: awake and alert   Airway & Oxygen Therapy: Patient Spontanous Breathing  Post-op Assessment: Report given to RN  Post vital signs: Reviewed and stable  Last Vitals:  Filed Vitals:   03/12/16 1006 03/12/16 1144  BP: 159/76 105/40  Pulse: 63 53  Temp: 36.2 C 36.5 C  Resp: 15 15    Complications: No apparent anesthesia complications

## 2016-03-12 NOTE — Anesthesia Preprocedure Evaluation (Signed)
Anesthesia Evaluation  Patient identified by MRN, date of birth, ID band Patient awake    Reviewed: Allergy & Precautions, H&P , NPO status , Patient's Chart, lab work & pertinent test results, reviewed documented beta blocker date and time   Airway Mallampati: III   Neck ROM: full    Dental  (+) Poor Dentition   Pulmonary neg pulmonary ROS, shortness of breath, former smoker,    Pulmonary exam normal        Cardiovascular hypertension, + CAD, + Past MI and + Peripheral Vascular Disease  negative cardio ROS Normal cardiovascular exam     Neuro/Psych PSYCHIATRIC DISORDERS negative neurological ROS  negative psych ROS   GI/Hepatic negative GI ROS, Neg liver ROS, GERD  ,  Endo/Other  negative endocrine ROSHypothyroidism   Renal/GU negative Renal ROS  negative genitourinary   Musculoskeletal   Abdominal   Peds  Hematology negative hematology ROS (+)   Anesthesia Other Findings Past Medical History:   PVD (peripheral vascular disease) ([place])                        Comment:s/p multiple revascularization procedures.   Carotid artery occlusion                                       Comment:s/p right carotid endarterectomy   Coronary artery disease                                        Comment:s/p coronary bypass graft surgery in 1992,               stable   Hyperlipidemia                                               Hypertension                                                 Myocardial infarction ([employer])                                  Shortness of breath                                          Chronic kidney disease                                         Comment:renal artery stenosis   Cancer ([facility])                                                   Comment:basel cell carcinoma left ear   GERD (gastroesophageal  reflux disease)                     Past Surgical History:   Surgery for Sleep Apnea                                        CORONARY ARTERY BYPASS GRAFT                     1992         CAROTID ENDARTERECTOMY                                          Comment:Right. With 46% stenosis in the right by recent              Doppler   Peripheral vascular revascularization                         SHOULDER ARTHROSCOPY                                            Comment:Right Amnioplasty; Acromioclavicular joint               resection and arthroscopic debridement   renal artery stenosis                            12/03/2011   left common iliac                                12/03/2011   abdomial angiogram                               03-16-2014   RENAL ANGIOGRAM                                  03-16-2014   ABDOMINAL AORTAGRAM                             N/A 12/03/2011      Comment:Procedure: ABDOMINAL Maxcine Ham;  Surgeon: Serafina Mitchell, MD;  Location: [facility] CATH LAB;                Service: Cardiovascular;  Laterality: N/A;   RENAL ANGIOGRAM                                 Left 12/03/2011      Comment:Procedure: RENAL ANGIOGRAM;  Surgeon: Serafina Mitchell, MD;  Location: [facility] CATH LAB;  Service:               Cardiovascular;  Laterality: Left;  lt renal  artery stent   ABDOMINAL ANGIOGRAM                             N/A 03/16/2014      Comment:Procedure: ABDOMINAL ANGIOGRAM;  Surgeon: Rosetta Posner, MD;  Location: [facility] CATH LAB;  Service:               Cardiovascular;  Laterality: N/A;   RENAL ANGIOGRAM                                 N/A 03/16/2014      Comment:Procedure: RENAL ANGIOGRAM;  Surgeon: Rosetta Posner, MD;  Location[facility] CATH LAB;  Service:               Cardiovascular;  Laterality: N/A;   Reproductive/Obstetrics                             Anesthesia Physical Anesthesia Plan  ASA: III  Anesthesia Plan: General   Post-op Pain Management:    Induction:   Airway Management Planned:   Additional  Equipment:   Intra-op Plan:   Post-operative Plan:   Informed Consent: I have reviewed the patients History and Physical, chart, labs and discussed the procedure including the risks, benefits and alternatives for the proposed anesthesia with the patient or authorized representative who has indicated his/her understanding and acceptance.   Dental Advisory Given  Plan Discussed with: CRNA  Anesthesia Plan Comments:         Anesthesia Quick Evaluation

## 2016-03-13 LAB — SURGICAL PATHOLOGY

## 2016-03-13 NOTE — Anesthesia Postprocedure Evaluation (Signed)
Anesthesia Post Note  Patient: Clifford Aguilar  Procedure(s) Performed: Procedure(s) (LRB): COLONOSCOPY WITH PROPOFOL (N/A)  Patient location during evaluation: PACU Anesthesia Type: General Level of consciousness: awake and alert Pain management: pain level controlled Vital Signs Assessment: post-procedure vital signs reviewed and stable Respiratory status: spontaneous breathing, nonlabored ventilation, respiratory function stable and patient connected to nasal cannula oxygen Cardiovascular status: blood pressure returned to baseline and stable Postop Assessment: no signs of nausea or vomiting Anesthetic complications: no    Last Vitals:  Filed Vitals:   03/12/16 1200 03/12/16 1210  BP: 127/47 161/56  Pulse: 54 51  Temp:    Resp: 16 0    Last Pain:  Filed Vitals:   03/13/16 0817  PainSc: 0-No pain                 Molli Barrows

## 2016-03-15 ENCOUNTER — Telehealth: Payer: Self-pay | Admitting: Cardiovascular Disease

## 2016-03-15 NOTE — Telephone Encounter (Signed)
Patient's wife called and Wesely seems to feel much better after being off simvastatin (ZOCOR) 40 MG tablet. He has not taken it for 10 day due to a colonoscopy. His symptom nausea, vomiting, cramping and diarreah. He thinks he needs a medication change. Please call Juliann at [phone]. Thanks!

## 2016-03-17 NOTE — Telephone Encounter (Signed)
Would change to atorvastatin 40 mg daily, #30, refill x 6

## 2016-03-18 MED ORDER — ATORVASTATIN CALCIUM 40 MG PO TABS: 40.0000 mg | ORAL_TABLET | Freq: Every day | ORAL | Status: DC

## 2016-03-18 NOTE — Telephone Encounter (Signed)
Spoke with patient and he states that he has been feeling much better since being off of the zocor. Let him know that Dr. Rockey Situ recommended that he switch to atorvastatin and prescription has been sent in for him. He had no further questions at this time.

## 2016-03-19 ENCOUNTER — Encounter: Payer: Self-pay | Admitting: Gastroenterology

## 2016-03-19 DIAGNOSIS — H4089 Other specified glaucoma: Secondary | ICD-10-CM | POA: Diagnosis not present

## 2016-03-22 ENCOUNTER — Telehealth: Payer: Self-pay | Admitting: Gastroenterology

## 2016-03-22 ENCOUNTER — Other Ambulatory Visit: Payer: Self-pay | Admitting: Family Medicine

## 2016-03-22 DIAGNOSIS — K5900 Constipation, unspecified: Secondary | ICD-10-CM

## 2016-03-22 MED ORDER — POLYETHYLENE GLYCOL 3350 17 G PO PACK: 17.0000 g | PACK | Freq: Every day | ORAL | Status: AC

## 2016-03-22 NOTE — Telephone Encounter (Signed)
Patient is calling for results and would also like to know if he needs to change his diet with diverticulosis

## 2016-03-25 NOTE — Telephone Encounter (Signed)
Pt has been scheduled for an appt with Dr. Allen Norris on 03/26/16 to discuss symptoms.

## 2016-03-26 ENCOUNTER — Encounter (INDEPENDENT_AMBULATORY_CARE_PROVIDER_SITE_OTHER): Payer: Self-pay

## 2016-03-26 ENCOUNTER — Ambulatory Visit (INDEPENDENT_AMBULATORY_CARE_PROVIDER_SITE_OTHER): Payer: Medicare HMO | Admitting: Gastroenterology

## 2016-03-26 ENCOUNTER — Encounter: Payer: Self-pay | Admitting: Gastroenterology

## 2016-03-26 VITALS — BP 120/60 | HR 72 | Temp 98.1°F | Ht 69.0 in | Wt 181.0 lb

## 2016-03-26 DIAGNOSIS — R1033 Periumbilical pain: Secondary | ICD-10-CM | POA: Diagnosis not present

## 2016-03-26 NOTE — Progress Notes (Signed)
Primary Care Physician: Wilhemena Durie, MD  Primary Gastroenterologist:  Dr. [name]  Chief Complaint  Patient presents with  . Follow up Colonoscopy    Not feeling any better    HPI: Clifford Aguilar is a 75 y.o. male here after having a colonoscopy that showed a small polyp and colonic ulcer. The patient states that he had been having abdominal cramps and abdominal pain prior to that. The patient states that his symptoms continued after having a colonoscopy. He reports he thought it was from his new generic medications. He stopped taking his medications and went back to the medications he was previously taking prior to the switch and states his symptoms completely resolved. The patient has been doing well without any complaints last few days.  Current Outpatient Prescriptions  Medication Sig Dispense Refill  . aspirin 81 MG tablet Take 81 mg by mouth daily.     Marland Kitchen atorvastatin (LIPITOR) 40 MG tablet Take 1 tablet (40 mg total) by mouth daily. 30 tablet 6  . benazepril (LOTENSIN) 20 MG tablet Take 1 tablet (20 mg total) by mouth 2 (two) times daily. 180 tablet 2  . clopidogrel (PLAVIX) 75 MG tablet Take 1 tablet (75 mg total) by mouth daily. 90 tablet 3  . Coenzyme Q10 (CO Q-10) 100 MG CAPS Take 100 mg by mouth daily.    Marland Kitchen doxazosin (CARDURA) 4 MG tablet Take 1 tablet (4 mg total) by mouth daily. 90 tablet 3  . ezetimibe (ZETIA) 10 MG tablet Take 1 tablet (10 mg total) by mouth daily. 30 tablet 6  . GARLIC PO Take [id number] mg by mouth. Takes 7 tablets daily.    . hydrochlorothiazide (HYDRODIURIL) 25 MG tablet Take 1 tablet (25 mg total) by mouth daily. 90 tablet 3  . lansoprazole (PREVACID) 15 MG capsule Take 1 capsule (15 mg total) by mouth daily at 12 noon. (Patient taking differently: Take 15 mg by mouth at bedtime. ) 30 capsule 6  . levothyroxine (SYNTHROID, LEVOTHROID) 25 MCG tablet Take 1 tablet (25 mcg total) by mouth daily before breakfast. 90 tablet 3  . nitroGLYCERIN  (NITROLINGUAL) 0.4 MG/SPRAY spray Place 1 spray under the tongue as needed for chest pain.    . nortriptyline (PAMELOR) 25 MG capsule Four capsules at bedtime 360 capsule 3  . polyethylene glycol (MIRALAX / GLYCOLAX) packet Take 17 g by mouth daily. 90 packet 3  . verapamil (CALAN-SR) 240 MG CR tablet Take 1 tablet (240 mg total) by mouth at bedtime. 90 tablet 3  . Zn-Pyg Afri-Nettle-Saw Palmet (SAW PALMETTO COMPLEX PO) Take 1,725 mg by mouth daily.     . simvastatin (ZOCOR) 40 MG tablet Reported on 03/26/2016     No current facility-administered medications for this visit.    Allergies as of 03/26/2016 - Review Complete 03/26/2016  Allergen Reaction Noted  . Hydralazine Rash 03/16/2014  . Adhesive [tape]    . Carvedilol  01/27/2014  . Clonidine derivatives Other (See Comments) 11/27/2011  . Lisinopril Other (See Comments) 06/29/2012  . Hydrazine yellow [tartrazine] Itching and Rash 03/03/2014  . Isosorbide dinitrate Rash 03/03/2014  . Isosorbide mononitrate Rash 11/26/2011    ROS:  General: Negative for anorexia, weight loss, fever, chills, fatigue, weakness. ENT: Negative for hoarseness, difficulty swallowing , nasal congestion. CV: Negative for chest pain, angina, palpitations, dyspnea on exertion, peripheral edema.  Respiratory: Negative for dyspnea at rest, dyspnea on exertion, cough, sputum, wheezing.  GI: See history of present illness. GU:  Negative  for dysuria, hematuria, urinary incontinence, urinary frequency, nocturnal urination.  Endo: Negative for unusual weight change.    Physical Examination:   BP 120/60 mmHg  Pulse 72  Temp(Src) 98.1 F (36.7 C) (Oral)  Ht 5\' 9"  (1.753 m)  Wt 181 lb (82.101 kg)  BMI 26.72 kg/m2  General: Well-nourished, well-developed in no acute distress.  Eyes: No icterus. Conjunctivae pink. Mouth: Oropharyngeal mucosa moist and pink , no lesions erythema or exudate. Lungs: Clear to auscultation bilaterally. Non-labored. Heart: Regular  rate and rhythm, no murmurs rubs or gallops.  Abdomen: Bowel sounds are normal, nontender, nondistended, no hepatosplenomegaly or masses, no abdominal bruits or hernia , no rebound or guarding.   Extremities: No lower extremity edema. No clubbing or deformities. Neuro: Alert and oriented x 3.  Grossly intact. Skin: Warm and dry, no jaundice.   Psych: Alert and cooperative, normal mood and affect.  Labs:    Imaging Studies: No results found.  Assessment and Plan:   Radeen Kell is a 75 y.o. y/o male who comes in with chronic lower abdominal pain that he has found to be caused by his change in his medication. The patient has gone back to his previous biosimilar medication and reports that his symptoms have resolved. The patient will contact me if his symptoms return.   Note: This dictation was prepared with Dragon dictation along with smaller phrase technology. Any transcriptional errors that result from this process are unintentional.

## 2016-04-01 ENCOUNTER — Encounter: Payer: Self-pay | Admitting: Family Medicine

## 2016-04-01 ENCOUNTER — Ambulatory Visit (INDEPENDENT_AMBULATORY_CARE_PROVIDER_SITE_OTHER): Payer: Medicare HMO | Admitting: Family Medicine

## 2016-04-01 VITALS — BP 132/84 | HR 84 | Temp 97.7°F | Resp 16 | Wt 181.0 lb

## 2016-04-01 DIAGNOSIS — R413 Other amnesia: Secondary | ICD-10-CM | POA: Diagnosis not present

## 2016-04-01 NOTE — Patient Instructions (Signed)
We will call you with the lab results. 

## 2016-04-01 NOTE — Progress Notes (Signed)
Subjective:     Patient ID: Clifford Aguilar, male   DOB: [birth year], 75 y.o.   MRN: [id number]  HPI  Chief Complaint  Patient presents with  . Dementia    Patient comes in office today accompanied by his wife who is requesting a baseline test for Alzheimers disease  Wife wishes to get a baseline cognitive screen and monitor over time. [name] had deferred screen at recent physical but reported some trouble remembering names. Labs, including TSH were ok at that time. Has past history of significant vascular disease.   Review of Systems     Objective:   Physical Exam  Neurological: He is alert.  1.What year is it?          0 or 4--------0 2.What month is it?       0 or 3-------0 3.Remember the following address (ask patient to repeat and remember for later)                           Janice Norrie                          [address], [place]  4.What time is it?          0 or 4----------0 5. Count backwards from 20-1     0 or 2 (1 error) or 4-----1 6. Months of the year backwards       0 or 2 or 4-----2 7. Repeat previous memory phrase   0 or 3 (one error) or 4 (2 errors) or 6 (3 errors) or 8 (4 errors) or 10 (all incorrect)--------3 ______________________________________________________________________ Total score: 6 0-7 normal   8-9 mild cognitive impairment    10-28 significant cognitive impairment     Assessment:    1. Memory changes - B12 - Folate    Plan:    Further f/u pending labs. Consider repeat screening 6-12 months.

## 2016-04-02 ENCOUNTER — Telehealth: Payer: Self-pay

## 2016-04-02 LAB — FOLATE: Folate: 10.7 ng/mL (ref >3.0)

## 2016-04-02 LAB — VITAMIN B12: Vitamin B-12: 510 pg/mL (ref 211–946)

## 2016-04-02 NOTE — Telephone Encounter (Signed)
-----   Message from Carmon Ginsberg, [place] sent at 04/02/2016  7:41 AM EDT ----- Vitamin levels ok

## 2016-04-02 NOTE — Telephone Encounter (Signed)
Patients wife has been advised. KW 

## 2016-04-05 ENCOUNTER — Telehealth: Payer: Self-pay | Admitting: Cardiovascular Disease

## 2016-04-05 NOTE — Telephone Encounter (Signed)
Spoke w/ pt's wife.  She reports that pt recently had GI workup w/ colonoscopy. All findings were "normal and nothing was structurally wrong". She reports that pt has had GI issues for about 6 weeks, pt stopped verapamil for a few days and sx improved. On restarting verapamil, pt developed nausea & vomiting.  Looks like pt has c/o similar sx w/ previous meds, so she is unsure what the cause is. She reports pt's most recent BP reading 112/60 on Monday (he does not check often). She would like to know if verapamil can be changed to something that will be gentler on his stomach. Please advise.  Thank you.

## 2016-04-05 NOTE — Telephone Encounter (Signed)
Pt wife states pt Verapamil is causing him GI problems. Please call to discuss changing this medication. Call Cell if no answer on home contact #

## 2016-04-09 NOTE — Telephone Encounter (Signed)
Notes indicate the pill is a tablet Could try cutting in half, taking after food This would be 120 mg rather than 240 mg Would monitor blood pressure

## 2016-04-10 NOTE — Telephone Encounter (Signed)
Spoke w/ pt's wife.  She reports that pt stopped verapamil 1 week ago and all of his sx have subsided. BP has been excellent, 120/65 on last check. She believes that sx are 2/2 change in med manufacturer. Advised her to continue to monitor and call back if #s start to climb.

## 2016-05-03 ENCOUNTER — Telehealth: Payer: Self-pay

## 2016-05-03 NOTE — Telephone Encounter (Signed)
Phone call from pt's. Wife.  Stated the pt. Was seen in our office last October.  Requested to clarify ASA dose; stated the pt. Can't remember if he should take ASA 81 mg, 1 or 2 tablets/ day.  Advised will discuss with Dr. Donnetta Hutching, and return call next week, regarding recommendation.  Agreed.

## 2016-05-07 ENCOUNTER — Telehealth: Payer: Self-pay | Admitting: Cardiovascular Disease

## 2016-05-07 NOTE — Telephone Encounter (Signed)
Clifford Aguilar called back and stated that she is going to try to order Zetia from [place], as it is more affordable.  Asked her to call back if we can be of further assistance.

## 2016-05-07 NOTE — Telephone Encounter (Signed)
Left message for pt or wife to call back.

## 2016-05-07 NOTE — Telephone Encounter (Signed)
Pt wife called regarding pt Zetia cost. Would like to know if she can order from [place].  States the cost has increased to $100/month Please call and advise

## 2016-05-07 NOTE — Telephone Encounter (Signed)
Discussed question of ASA dose with Dr. Donnetta Hutching.  Advised to have pt. Continue to take ASA 81 mg daily.  Phone call to pt. And wife; left voice message with recommendation on ASA dose; and advised to call office if questions.

## 2016-05-13 ENCOUNTER — Other Ambulatory Visit: Payer: Self-pay

## 2016-05-13 MED ORDER — EZETIMIBE 10 MG PO TABS: 10.0000 mg | ORAL_TABLET | Freq: Every day | ORAL | Status: DC

## 2016-05-13 MED ORDER — NITROGLYCERIN 0.4 MG/SPRAY TL SOLN
1.0000 | Status: DC | PRN
Start: 2016-05-13 — End: 2017-10-01

## 2016-05-13 NOTE — Telephone Encounter (Signed)
Pt would like sent to [facility]. Fax [phone]

## 2016-05-24 ENCOUNTER — Other Ambulatory Visit: Payer: Self-pay

## 2016-05-24 MED ORDER — ATORVASTATIN CALCIUM 40 MG PO TABS
40.0000 mg | ORAL_TABLET | Freq: Every day | ORAL | Status: DC
Start: 2016-05-24 — End: 2017-04-04

## 2016-05-24 NOTE — Telephone Encounter (Signed)
90 day supply

## 2016-05-30 DIAGNOSIS — Z08 Encounter for follow-up examination after completed treatment for malignant neoplasm: Secondary | ICD-10-CM | POA: Diagnosis not present

## 2016-05-30 DIAGNOSIS — L570 Actinic keratosis: Secondary | ICD-10-CM | POA: Diagnosis not present

## 2016-05-30 DIAGNOSIS — S80861A Insect bite (nonvenomous), right lower leg, initial encounter: Secondary | ICD-10-CM | POA: Diagnosis not present

## 2016-05-30 DIAGNOSIS — Z85828 Personal history of other malignant neoplasm of skin: Secondary | ICD-10-CM | POA: Diagnosis not present

## 2016-05-30 DIAGNOSIS — L300 Nummular dermatitis: Secondary | ICD-10-CM | POA: Diagnosis not present

## 2016-05-30 DIAGNOSIS — X32XXXA Exposure to sunlight, initial encounter: Secondary | ICD-10-CM | POA: Diagnosis not present

## 2016-06-11 ENCOUNTER — Ambulatory Visit (INDEPENDENT_AMBULATORY_CARE_PROVIDER_SITE_OTHER): Payer: Medicare HMO | Admitting: Family Medicine

## 2016-06-11 VITALS — BP 120/80 | HR 78 | Temp 98.3°F | Resp 16 | Ht 69.0 in | Wt 179.2 lb

## 2016-06-11 DIAGNOSIS — K219 Gastro-esophageal reflux disease without esophagitis: Secondary | ICD-10-CM

## 2016-06-11 MED ORDER — SUCRALFATE 1 G PO TABS: 1.0000 g | ORAL_TABLET | Freq: Three times a day (TID) | ORAL | Status: DC

## 2016-06-11 NOTE — Patient Instructions (Signed)
Increase lansoprazole 15 mg.to two pills once daily in the morning. Continue for four weeks. If heartburn not resolved call for referral.

## 2016-06-11 NOTE — Progress Notes (Signed)
Subjective:     Patient ID: Clifford Aguilar, male   DOB: Dec 07, 1941, 75 y.o.   MRN: [id number]  HPI  Chief Complaint  Patient presents with  . Gastroesophageal Reflux    onset long time but it's getting worst now   States he takes Rolaids frequently in addition to lansoprazole 15 mg.daily. Denies nocturnal sx but notices heatburn particularly when he bends over. Denies heavy lifting, increased stress or caffeine use. Weight is stable. Accompanied by his wife today.   Review of Systems  Gastrointestinal:       EGD in 2004 with hiatal hernia and gastritis       Objective:   Physical Exam  Constitutional: He appears well-developed and well-nourished. No distress.  Abdominal: Soft. Bowel sounds are normal. There is no tenderness.       Assessment:    1. Gastroesophageal reflux disease without esophagitis - sucralfate (CARAFATE) 1 g tablet; Take 1 tablet (1 g total) by mouth 4 (four) times daily -  with meals and at bedtime.  Dispense: 28 tablet; Refill: 0    Plan:    Increase lansoprazole to 30 mg.for up to 4 weeks. May continue to use antacids as needed. Consider G.I. If not improving.

## 2016-07-01 ENCOUNTER — Encounter: Payer: Self-pay | Admitting: Cardiovascular Disease

## 2016-07-01 ENCOUNTER — Ambulatory Visit (INDEPENDENT_AMBULATORY_CARE_PROVIDER_SITE_OTHER): Payer: Medicare HMO | Admitting: Cardiovascular Disease

## 2016-07-01 VITALS — BP 107/63 | HR 71 | Ht 69.0 in | Wt 177.5 lb

## 2016-07-01 DIAGNOSIS — I739 Peripheral vascular disease, unspecified: Secondary | ICD-10-CM

## 2016-07-01 DIAGNOSIS — I25718 Atherosclerosis of autologous vein coronary artery bypass graft(s) with other forms of angina pectoris: Secondary | ICD-10-CM

## 2016-07-01 DIAGNOSIS — I6523 Occlusion and stenosis of bilateral carotid arteries: Secondary | ICD-10-CM

## 2016-07-01 DIAGNOSIS — I10 Essential (primary) hypertension: Secondary | ICD-10-CM

## 2016-07-01 DIAGNOSIS — E785 Hyperlipidemia, unspecified: Secondary | ICD-10-CM

## 2016-07-01 MED ORDER — EZETIMIBE 10 MG PO TABS: 10.0000 mg | ORAL_TABLET | Freq: Every day | ORAL | Status: DC

## 2016-07-01 NOTE — Progress Notes (Signed)
Patient ID: Clifford Aguilar, male   DOB: 12-09-1941, 75 y.o.   MRN: [id number] Cardiology Office Note  Date:  07/01/2016   ID:  Karanveer, Lemberg Aug 06, 1941, MRN [id number]  PCP:  Wilhemena Durie, MD   Chief Complaint  Patient presents with  . other    6 month f/u. Meds reviewed verbally with pt.    HPI:  75 years old with CAD, peripheral vascular disease, bypass surgery in 1992, carotid endarterectomy on the right with residual 60-70% disease on last ultrasound in 2013 , chronically occluded left carotid artery, multiple surgical procedures on his lower extremity by Dr. Donnetta Hutching, left iliac stent and left renal angioplasty with Dr. Trula Slade on 12/03/2011, who presents for routine followup of his CAD, s/p CABG S/p renal stent for severe HTN. He does have significant celiac and superior mesenteric arterial disease estimated at greater than 75%. ABIs 0.5 or less bilaterally April 2013.  In followup today he reports that he is doing well.  He denies any recent chest pain or shortness of breath. He is exercising on a regular basis. Wife presents with him today, reports he is doing well chronic leg pain with overexertion, but tries to walk daily He is otherwise active with no complaints.  seen for his PAD in [place]. Monitored by Dr. Donnetta Hutching.  Last carotid ultrasound October 2016  Tolerating Lipitor 40 mg daily  total cholesterol 133, LDL 60  EKG on today's visit showing normal sinus rhythm with no significant ST or T-wave changes, rate 71 bpm  Other past medical history reviewed in January 2015, he developed symptoms of "head heaviness ", with discomfort in the back of his neck He took a NTG, and subsequently his BP dropped, he had syncope, family called 911, he was taken by emt to [facility] It was felt his hypotension related to treatment with Cardura in addition to taking sublingual nitroglycerin in the standing position. He ruled out for myocardial infarction. He underwent a nuclear  stress test which showed no evidence of ischemia. Echocardiogram was also unremarkable. He was discharged home on hydralazine in addition to his other blood pressure medications. Since then he reports stopping hydralazine secondary to rash.  In fact he has numerous medications that have given him a rash   3/15 he was seen by Dr. Donnetta Hutching Found to have renal artery stenosis that had progressed,  3/18 he had catheterization for renal artery stent placement  Previous history of blurry vision episodes.   He has not had a cardiac catheterization or stress test in 10 years. He believes he had a catheterization 10 years ago, 3 catheterizations since his bypass surgery 20 years ago.  He quit smoking after his bypass surgery in 1992.  PMH:   has a past medical history of PVD (peripheral vascular disease) ([place]); Carotid artery occlusion; Coronary artery disease; Hyperlipidemia; Hypertension; Myocardial infarction ([place]); Shortness of breath; Chronic kidney disease; Cancer ([place]); and GERD (gastroesophageal reflux disease).  PSH:    Past Surgical History  Procedure Laterality Date  . Surgery for sleep apnea    . Coronary artery bypass graft  1992  . Carotid endarterectomy      Right. With 46% stenosis in the right by recent Doppler  . Peripheral vascular revascularization     . Shoulder arthroscopy      Right Amnioplasty; Acromioclavicular joint resection and arthroscopic debridement  . Renal artery stenosis  12/03/2011  . Left common iliac  12/03/2011  . Abdomial angiogram  03-16-2014  .  Renal angiogram  03-16-2014  . Abdominal aortagram N/A 12/03/2011    Procedure: ABDOMINAL Maxcine Ham;  Surgeon: Serafina Mitchell, MD;  Location[facility] CATH LAB;  Service: Cardiovascular;  Laterality: N/A;  . Renal angiogram Left 12/03/2011    Procedure: RENAL ANGIOGRAM;  Surgeon: Serafina Mitchell, MD;  Location: [facility] CATH LAB;  Service: Cardiovascular;  Laterality: Left;  lt renal artery stent  . Abdominal angiogram  N/A 03/16/2014    Procedure: ABDOMINAL ANGIOGRAM;  Surgeon: Rosetta Posner, MD;  Location: [facility] CATH LAB;  Service: Cardiovascular;  Laterality: N/A;  . Renal angiogram N/A 03/16/2014    Procedure: RENAL ANGIOGRAM;  Surgeon: Rosetta Posner, MD;  Location: [facility] CATH LAB;  Service: Cardiovascular;  Laterality: N/A;  . Colonoscopy with propofol N/A 03/12/2016    Procedure: COLONOSCOPY WITH PROPOFOL;  Surgeon: [name], MD;  Location: [facility] ENDOSCOPY;  Service: Endoscopy;  Laterality: N/A;    Current Outpatient Prescriptions  Medication Sig Dispense Refill  . aspirin 81 MG tablet Take 81 mg by mouth daily.     Marland Kitchen atorvastatin (LIPITOR) 40 MG tablet Take 1 tablet (40 mg total) by mouth daily. 90 tablet 3  . benazepril (LOTENSIN) 20 MG tablet Take 1 tablet (20 mg total) by mouth 2 (two) times daily. 180 tablet 2  . clopidogrel (PLAVIX) 75 MG tablet Take 1 tablet (75 mg total) by mouth daily. 90 tablet 3  . Coenzyme Q10 (CO Q-10) 100 MG CAPS Take 100 mg by mouth daily.    Marland Kitchen doxazosin (CARDURA) 4 MG tablet Take 1 tablet (4 mg total) by mouth daily. 90 tablet 3  . ezetimibe (ZETIA) 10 MG tablet Take 1 tablet (10 mg total) by mouth daily. 90 tablet 4  . GARLIC PO Take [id number] mg by mouth. Takes 7 tablets daily.    . hydrochlorothiazide (HYDRODIURIL) 25 MG tablet Take 1 tablet (25 mg total) by mouth daily. 90 tablet 3  . lansoprazole (PREVACID) 15 MG capsule Take 1 capsule (15 mg total) by mouth daily at 12 noon. (Patient taking differently: Take 30 mg by mouth daily at 12 noon. ) 30 capsule 6  . levothyroxine (SYNTHROID, LEVOTHROID) 25 MCG tablet Take 1 tablet (25 mcg total) by mouth daily before breakfast. 90 tablet 3  . nitroGLYCERIN (NITROLINGUAL) 0.4 MG/SPRAY spray Place 1 spray under the tongue as needed for chest pain. 12 g 3  . nortriptyline (PAMELOR) 25 MG capsule Four capsules at bedtime 360 capsule 3  . polyethylene glycol (MIRALAX / GLYCOLAX) packet Take 17 g by mouth daily. 90 packet 3  . triamcinolone  cream (KENALOG) 0.1 %     . Zn-Pyg Afri-Nettle-Saw Palmet (SAW PALMETTO COMPLEX PO) Take 1,725 mg by mouth daily.      No current facility-administered medications for this visit.     Allergies:   Hydralazine; Adhesive; Carvedilol; Clonidine derivatives; Lisinopril; Hydrazine yellow; Isosorbide dinitrate; and Isosorbide mononitrate   Social History:  The patient  reports that he quit smoking about 25 years ago. His smoking use included Cigarettes. He quit smokeless tobacco use about 25 years ago. He reports that he drinks about 0.6 - 1.2 oz of alcohol per week. He reports that he does not use illicit drugs.   Family History:   family history includes Heart disease in his other; Stroke in his brother, mother, and sister.    Review of Systems: Review of Systems  Constitutional: Negative.   Respiratory: Negative.   Cardiovascular: Negative.   Gastrointestinal: Negative.   Musculoskeletal: Negative.  Neurological: Negative.   Psychiatric/Behavioral: Negative.   All other systems reviewed and are negative.    PHYSICAL EXAM: VS:  BP 107/63 mmHg  Pulse 71  Ht 5\' 9"  (1.753 m)  Wt 177 lb 8 oz (80.513 kg)  BMI 26.20 kg/m2 , BMI Body mass index is 26.2 kg/(m^2). GEN: Well nourished, well developed, in no acute distress HEENT: normal Neck: no JVD, + bruits bilaterally, no masses Cardiac: RRR; no murmurs, rubs, or gallops,no edema  Respiratory:  clear to auscultation bilaterally, normal work of breathing GI: soft, nontender, nondistended, + BS MS: no deformity or atrophy Skin: warm and dry, no rash Neuro:  Strength and sensation are intact Psych: euthymic mood, full affect    Recent Labs: 01/30/2016: ALT 18; BUN 20; Creatinine, Ser 0.98; Potassium 4.3; Sodium 141; TSH 4.140    Lipid Panel Lab Results  Component Value Date   CHOL 133 06/22/2015   HDL 35* 06/22/2015   LDLCALC 63 06/22/2015   TRIG 173* 06/22/2015      Wt Readings from Last 3 Encounters:  07/01/16 177 lb 8  oz (80.513 kg)  06/11/16 179 lb 3.2 oz (81.285 kg)  04/01/16 181 lb (82.101 kg)       ASSESSMENT AND PLAN:  Atherosclerosis of autologous vein coronary artery bypass graft with other forms of angina pectoris ([place]) - Plan: EKG 12-Lead Currently with no symptoms of angina. No further workup at this time. Continue current medication regimen.  Hyperlipidemia Cholesterol is at goal on the current lipid regimen. No changes to the medications were made. We will recheck on his next clinic visit  Essential hypertension Blood pressure is well controlled on today's visit. No changes made to the medications. Long discussion about decreasing HCTZ. Sweating a lot this summer, blood pressure running low today Suggested he try one half pill daily  Carotid artery stenosis, bilateral Managed in [place], last carotid ultrasound reviewed with him in detail Stressed the importance of smoking cessation, aggressive lipid management  PAD (peripheral artery disease) ([place]) As above,. She stopped smoking 1992 Cholesterol goal   Total encounter time more than 25 minutes  Greater than 50% was spent in counseling and coordination of care with the patient   Disposition:   F/U  6 months   Orders Placed This Encounter  Procedures  . EKG 12-Lead     Signed, Esmond Plants, M.D., Ph.D. 07/01/2016  [place], [place]

## 2016-07-01 NOTE — Patient Instructions (Signed)
Medication Instructions:   If you have lightheaded spells and pressure is low, Cut the HCTZ in 1/2 daily or every other day  Labwork:  Next visit,  do a morning visit appt, come fasting  Testing/Procedures:  No further testing  Follow-Up: It was a pleasure seeing you in the office today. Please call us if you have new issues that need to be addressed before your next appt.  [phone]  Your physician wants you to follow-up in: 6 months.  You will receive a reminder letter in the mail two months in advance. If you don't receive a letter, please call our office to schedule the follow-up appointment.  If you need a refill on your cardiac medications before your next appointment, please call your pharmacy.

## 2016-07-08 ENCOUNTER — Encounter: Payer: Self-pay | Admitting: Cardiovascular Disease

## 2016-07-23 ENCOUNTER — Telehealth: Payer: Self-pay | Admitting: *Deleted

## 2016-07-23 NOTE — Telephone Encounter (Signed)
Left detailed voicemail message regarding unread mychart message from 07/09/16. Left message for them to check mychart and call if any further questions.

## 2016-08-02 ENCOUNTER — Telehealth: Payer: Self-pay | Admitting: Cardiovascular Disease

## 2016-08-02 NOTE — Telephone Encounter (Signed)
Pt wife calling stating since they have been back from vacation 07/29/16  Pt c/o BP issue: STAT if pt c/o blurred vision, one-sided weakness or slurred speech  1. What are your last 5 BP readings?  08/02/16 194/76 08/01/16/ 194/70 07/31/16 191/65  2. Are you having any other symptoms (ex. Dizziness, headache, blurred vision, passed out)?no symptoms,   Pt feels fine just concern about BP   3. What is your BP issue?  Thinks it was Bisoprolol  He went off that during the vacation fur to throwing up and upset stomach They came back from vacation on 07/29/16

## 2016-08-02 NOTE — Telephone Encounter (Signed)
Patients wife reports that his blood pressures have been up since he stopped taking benazepril while on vacation. She states that he started having some nausea/vomiting at night when he took that medication. Let her know that since he has been on this medication for a long time it was unlikely that it would have done this now. Told her that his symptoms could have been from a number of things such as heat exposure, what he ate, and so forth. Let her know that since they are back at home and in normal routine to try again. Instructed her to have him restart the benazepril 20 mg twice daily, continue monitoring his blood pressures, and call back if symptoms return.  She verbalized understanding of our conversation, agreement with plan of care, and had no further questions at this time.

## 2016-08-09 ENCOUNTER — Telehealth: Payer: Self-pay | Admitting: Cardiovascular Disease

## 2016-08-09 NOTE — Telephone Encounter (Signed)
Patient's wife called and patient is having a reaction with vomiting and diarreah. He has had a GI doctor work up and everything is normal. They think it's coming from benazepril (LOTENSIN) 20 MG tablet. Please call wife asap.

## 2016-08-09 NOTE — Telephone Encounter (Signed)
Patient reports nausea, vomiting, and diarrhea when taking benazepril. He has seen his PCP for this and had a complete GI workup according to his wife. So he has stopped taking this medication and had some elevated blood pressures. Current blood pressure this AM was 130's over 50's and he has not taken any medications today or yesterday. Instructed him to continue monitoring his blood pressure readings and write them down to bring to next appointment and to call back if they remain elevated. Appointment scheduled for him to come see Dr. Rockey Situ on 08/19/16 at 2:20 pm. Both him and his wife verbalized understanding of our conversation, agreement with plan of care, and had no further questions at this time.

## 2016-08-19 ENCOUNTER — Ambulatory Visit (INDEPENDENT_AMBULATORY_CARE_PROVIDER_SITE_OTHER): Payer: Medicare HMO | Admitting: Cardiovascular Disease

## 2016-08-19 ENCOUNTER — Encounter: Payer: Self-pay | Admitting: Cardiovascular Disease

## 2016-08-19 VITALS — BP 140/64 | HR 88 | Ht 69.0 in | Wt 182.8 lb

## 2016-08-19 DIAGNOSIS — I739 Peripheral vascular disease, unspecified: Secondary | ICD-10-CM | POA: Diagnosis not present

## 2016-08-19 DIAGNOSIS — I2510 Atherosclerotic heart disease of native coronary artery without angina pectoris: Secondary | ICD-10-CM | POA: Diagnosis not present

## 2016-08-19 DIAGNOSIS — E785 Hyperlipidemia, unspecified: Secondary | ICD-10-CM

## 2016-08-19 DIAGNOSIS — I10 Essential (primary) hypertension: Secondary | ICD-10-CM

## 2016-08-19 DIAGNOSIS — I6523 Occlusion and stenosis of bilateral carotid arteries: Secondary | ICD-10-CM

## 2016-08-19 DIAGNOSIS — I25718 Atherosclerosis of autologous vein coronary artery bypass graft(s) with other forms of angina pectoris: Secondary | ICD-10-CM

## 2016-08-19 MED ORDER — DOXAZOSIN MESYLATE 8 MG PO TABS: 8.0000 mg | ORAL_TABLET | Freq: Two times a day (BID) | ORAL | 11 refills | Status: DC

## 2016-08-19 NOTE — Patient Instructions (Addendum)
Medication Instructions:   Please increase the doxazosin up to 4 mg twice a day  Wait a few day Check pressures If they continue to run high, Increase up to 8 mg in the AM and 4 mg in the pm  Wait a few day Check pressures If they continue to run high, Increase up to 8 mg in the AM and 8 mg in the pm  CAll the office with blood pressure numbers  Labwork:  No new labs  Testing/Procedures:  No new testing   Follow-Up: It was a pleasure seeing you in the office today. Please call us if you have new issues that need to be addressed before your next appt.  [phone]  Your physician wants you to follow-up in: 6 months.  You will receive a reminder letter in the mail two months in advance. If you don't receive a letter, please call our office to schedule the follow-up appointment.  If you need a refill on your cardiac medications before your next appointment, please call your pharmacy.

## 2016-08-19 NOTE — Progress Notes (Signed)
Cardiology Office Note  Date:  08/19/2016   ID:  Clifford, Aguilar Jan 02, 1941, MRN [id number]  PCP:  Wilhemena Durie, MD   Chief Complaint  Patient presents with  . Other    C/o nausea and diarrhea. Meds reviewed verbally with pt.    HPI:  75 years old with CAD, peripheral vascular disease, bypass surgery in 1992, carotid endarterectomy on the right with residual 60-70% disease on last ultrasound in 2013 , chronically occluded left carotid artery, multiple surgical procedures on his lower extremity by Dr. Donnetta Hutching, left iliac stent and left renal angioplasty with Dr. Trula Slade on 12/03/2011, who presents for routine followup of his CAD, s/p CABG  S/p renal stent for severe HTN. He does have significant celiac and superior mesenteric arterial disease estimated at greater than 75%. ABIs 0.5 or less bilaterally April 2013.  In follow-up today, he reports that he Stopped benazepril, this was causing diarrhea, Reports symptoms have improved without the medication Now BP elevated, 99991111 to 99991111 systolic on a regular basis He does have some leg edema, takes HCTZ daily Tolerating low-dose Cardura Also tolerating Lipitor and Zetia  Previous carotid ultrasound reviewed with him in detail Carotid 10/16 40 to 59% on the right Followed in [place]  chronic leg pain with overexertion  Other past medical history reviewed in January 2015, he developed symptoms of "head heaviness ", with discomfort in the back of his neck He took a NTG, and subsequently his BP dropped, he had syncope, family called 911, he was [facility] by em[facility] It was felt his hypotension related to treatment with Cardura in addition to taking sublingual nitroglycerin in the standing position. He ruled out for myocardial infarction. He underwent a nuclear stress test which showed no evidence of ischemia. Echocardiogram was also unremarkable. He was discharged home on hydralazine in addition to his other blood pressure  medications. Since then he reports stopping hydralazine secondary to rash.  In fact he has numerous medications that have given him a rash   3/15 he was seen by Dr. Donnetta Hutching Found to have renal artery stenosis that had progressed,  3/18 he had catheterization for renal artery stent placement  Previous history of blurry vision episodes.   He has not had a cardiac catheterization or stress test in 10 years. He believes he had a catheterization 10 years ago, 3 catheterizations since his bypass surgery 20 years ago.  He quit smoking after his bypass surgery in 1992.  Allergies: imdur/isoorbide dinitrate, lisinsopril, clonidine, hydralazine, coreg,   PMH:   has a past medical history of Cancer ([place]); Carotid artery occlusion; Chronic kidney disease; Coronary artery disease; GERD (gastroesophageal reflux disease); Hyperlipidemia; Hypertension; Myocardial infarction [facility]); PVD (peripheral vascular disease) ([place]); and Shortness of breath.  PSH:    Past Surgical History:  Procedure Laterality Date  . abdomial angiogram  03-16-2014  . ABDOMINAL ANGIOGRAM N/A 03/16/2014   Procedure: ABDOMINAL ANGIOGRAM;  Surgeon: Rosetta Posner, MD;  Location: [facility] CATH LAB;  Service: Cardiovascular;  Laterality: N/A;  . ABDOMINAL AORTAGRAM N/A 12/03/2011   Procedure: ABDOMINAL AORTAGRAM;  Surgeon: Serafina Mitchell, MD;  Location: [facility] CATH LAB;  Service: Cardiovascular;  Laterality: N/A;  . CAROTID ENDARTERECTOMY     Right. With 46% stenosis in the right by recent Doppler  . COLONOSCOPY WITH PROPOFOL N/A 03/12/2016   Procedure: COLONOSCOPY WITH PROPOFOL;  Surgeon: [name], MD;  Location: [facility] ENDOSCOPY;  Service: Endoscopy;  Laterality: N/A;  . CORONARY ARTERY BYPASS GRAFT  1992  .  left common iliac  12/03/2011  . Peripheral vascular revascularization     . RENAL ANGIOGRAM  03-16-2014  . RENAL ANGIOGRAM Left 12/03/2011   Procedure: RENAL ANGIOGRAM;  Surgeon: Serafina Mitchell, MD;  Location: [facility] CATH LAB;   Service: Cardiovascular;  Laterality: Left;  lt renal artery stent  . RENAL ANGIOGRAM N/A 03/16/2014   Procedure: RENAL ANGIOGRAM;  Surgeon: Rosetta Posner, MD;  Location: [facility] CATH LAB;  Service: Cardiovascular;  Laterality: N/A;  . renal artery stenosis  12/03/2011  . SHOULDER ARTHROSCOPY     Right Amnioplasty; Acromioclavicular joint resection and arthroscopic debridement  . Surgery for Sleep Apnea      Current Outpatient Prescriptions  Medication Sig Dispense Refill  . aspirin 81 MG tablet Take 81 mg by mouth daily.     Marland Kitchen atorvastatin (LIPITOR) 40 MG tablet Take 1 tablet (40 mg total) by mouth daily. 90 tablet 3  . clopidogrel (PLAVIX) 75 MG tablet Take 1 tablet (75 mg total) by mouth daily. 90 tablet 3  . Coenzyme Q10 (CO Q-10) 100 MG CAPS Take 100 mg by mouth daily.    Marland Kitchen doxazosin (CARDURA) 8 MG tablet Take 1 tablet (8 mg total) by mouth 2 (two) times daily. 60 tablet 11  . ezetimibe (ZETIA) 10 MG tablet Take 1 tablet (10 mg total) by mouth daily. 90 tablet 4  . GARLIC PO Take [id number] mg by mouth. Takes 7 tablets daily.    . hydrochlorothiazide (HYDRODIURIL) 25 MG tablet Take 1 tablet (25 mg total) by mouth daily. 90 tablet 3  . lansoprazole (PREVACID) 15 MG capsule Take 1 capsule (15 mg total) by mouth daily at 12 noon. (Patient taking differently: Take 30 mg by mouth daily at 12 noon. ) 30 capsule 6  . levothyroxine (SYNTHROID, LEVOTHROID) 25 MCG tablet Take 1 tablet (25 mcg total) by mouth daily before breakfast. 90 tablet 3  . nitroGLYCERIN (NITROLINGUAL) 0.4 MG/SPRAY spray Place 1 spray under the tongue as needed for chest pain. 12 g 3  . nortriptyline (PAMELOR) 25 MG capsule Four capsules at bedtime 360 capsule 3  . polyethylene glycol (MIRALAX / GLYCOLAX) packet Take 17 g by mouth daily. 90 packet 3  . triamcinolone cream (KENALOG) 0.1 %     . Zn-Pyg Afri-Nettle-Saw Palmet (SAW PALMETTO COMPLEX PO) Take 1,725 mg by mouth daily.      No current facility-administered medications for  this visit.      Allergies:   Hydralazine; Adhesive [tape]; Benazepril; Carvedilol; Clonidine derivatives; Lisinopril; Hydrazine yellow [tartrazine]; Isosorbide dinitrate; and Isosorbide mononitrate   Social History:  The patient  reports that he quit smoking about 25 years ago. His smoking use included Cigarettes. He quit smokeless tobacco use about 25 years ago. He reports that he drinks about 0.6 - 1.2 oz of alcohol per week . He reports that he does not use drugs.   Family History:   family history includes Heart disease in his other; Stroke in his brother, mother, and sister.    Review of Systems: Review of Systems  Constitutional: Negative.   Respiratory: Negative.   Cardiovascular: Negative.   Gastrointestinal: Positive for diarrhea.  Musculoskeletal: Negative.        Leg pain  Neurological: Negative.   Psychiatric/Behavioral: Negative.   All other systems reviewed and are negative.    PHYSICAL EXAM: VS:  BP 140/64 (BP Location: Left Arm, Patient Position: Sitting, Cuff Size: Normal)   Pulse 88   Ht 5\' 9"  (1.753 m)  Wt 182 lb 12 oz (82.9 kg)   BMI 26.99 kg/m  , BMI Body mass index is 26.99 kg/m. GEN: Well nourished, well developed, in no acute distress  HEENT: normal  Neck: no JVD, + bilateral carotid bruits, no masses Cardiac: RRR; no murmurs, rubs, or gallops,no edema  Respiratory:  clear to auscultation bilaterally, normal work of breathing GI: soft, nontender, nondistended, + BS MS: no deformity or atrophy  Skin: warm and dry, no rash Neuro:  Strength and sensation are intact Psych: euthymic mood, full affect    Recent Labs: 01/30/2016: ALT 18; BUN 20; Creatinine, Ser 0.98; Potassium 4.3; Sodium 141; TSH 4.140    Lipid Panel Lab Results  Component Value Date   CHOL 133 06/22/2015   HDL 35 (L) 06/22/2015   LDLCALC 63 06/22/2015   TRIG 173 (H) 06/22/2015      Wt Readings from Last 3 Encounters:  08/19/16 182 lb 12 oz (82.9 kg)  07/01/16 177 lb 8  oz (80.5 kg)  06/11/16 179 lb 3.2 oz (81.3 kg)       ASSESSMENT AND PLAN:  Atherosclerosis of native coronary artery of native heart without angina pectoris Marlowe Kays with no symptoms of angina No further testing at this time  PAD (peripheral artery disease) (HCC) Diffuse PAD and coronary disease, followed in [facility] Carotid ultrasound discussed with him in detail  Essential hypertension Blood pressure is well controlled on today's visit. No changes made to the medications.  Hyperlipidemia Cholesterol is at goal on the current lipid regimen. No changes to the medications were made.  Atherosclerosis of autologous vein coronary artery bypass graft with other forms of angina pectoris ([place]) Currently with no symptoms of angina. No further workup at this time. Continue current medication regimen.  Carotid artery stenosis, bilateral  Occluded on the left, 40-59% disease on the right Cholesterol at goal  HTN: No Ca channel blocker, this will make leg swelling worse Significant medication intolerances as detailed above Recommended he increase Cardura to 4 mg twice a day with slow titration up to 8 mg twice a day if needed for hypertension Suggested he continue to monitor blood pressure and call our office if blood pressure continues to run high    Total encounter time more than 25 minutes  Greater than 50% was spent in counseling and coordination of care with the patient   Disposition:   F/U  6 months   Signed, Esmond Plants, M.D., Ph.D. 08/19/2016  [place], [place]

## 2016-08-30 DIAGNOSIS — K592 Neurogenic bowel, not elsewhere classified: Secondary | ICD-10-CM | POA: Diagnosis not present

## 2016-08-30 DIAGNOSIS — R69 Illness, unspecified: Secondary | ICD-10-CM | POA: Diagnosis not present

## 2016-08-30 DIAGNOSIS — R609 Edema, unspecified: Secondary | ICD-10-CM | POA: Diagnosis not present

## 2016-08-30 DIAGNOSIS — J029 Acute pharyngitis, unspecified: Secondary | ICD-10-CM | POA: Diagnosis not present

## 2016-08-30 DIAGNOSIS — G8252 Quadriplegia, C1-C4 incomplete: Secondary | ICD-10-CM | POA: Diagnosis not present

## 2016-09-09 ENCOUNTER — Other Ambulatory Visit: Payer: Self-pay

## 2016-09-09 ENCOUNTER — Telehealth: Payer: Self-pay | Admitting: Cardiovascular Disease

## 2016-09-09 MED ORDER — EZETIMIBE 10 MG PO TABS: 10.0000 mg | ORAL_TABLET | Freq: Every day | ORAL | 3 refills | Status: DC

## 2016-09-09 NOTE — Telephone Encounter (Signed)
Pt wife is calling with BP readings: 9/11 -203/70 9/10-175/70 9/9-159/60 9/8-165/61 9/7-180/60 9/6-175/58 9/5-178/62

## 2016-09-09 NOTE — Telephone Encounter (Signed)
Send rx to [facility] fax [phone] Phone [phone]

## 2016-09-09 NOTE — Telephone Encounter (Signed)
Pt was advised on 8/21 to: "Please increase the doxazosin up to 4 mg twice a day  Wait a few day Check pressures If they continue to run high, Increase up to 8 mg in the AM and 4 mg in the pm  Wait a few day Check pressures If they continue to run high, Increase up to 8 mg in the AM and 8 mg in the pm  CAll the office with blood pressure numbers"

## 2016-09-09 NOTE — Telephone Encounter (Signed)
Of the algorithm below, what is he taking?

## 2016-09-10 ENCOUNTER — Other Ambulatory Visit: Payer: Self-pay | Admitting: *Deleted

## 2016-09-10 MED ORDER — EZETIMIBE 10 MG PO TABS: 10.0000 mg | ORAL_TABLET | Freq: Every day | ORAL | 6 refills | Status: DC

## 2016-09-10 NOTE — Telephone Encounter (Signed)
Spoke with patients wife and let her know that she could pick up prescription for zetia at [facility] in [place] for $25.00 cash price for 30 day supply. She then requested it be sent there. Prescription sent in for that and paper prescription placed in "Save" bin on [address].

## 2016-09-10 NOTE — Telephone Encounter (Signed)
Patients wife called in stating that her husband is currently taking the Doxazosin 8 mg twice a day and he has been at that dose for over week. He is also on the HCTZ 25 mg daily as well. Current blood pressures were provided in previous message.  9/11 203/70 9/10 175/70 9/9 159/60 9/8 165/61 9/7 180/60 9/6 175/58 9/5 178/62 His wife states that this medication is not handling his pressures well and wants to know what else they can try. Let her know that I would forward this to Dr. Rockey Situ and see what his recommendations are. She also wants [place] to fax prescription to [facility]. Let her know that I would check on this and be in touch.

## 2016-09-10 NOTE — Telephone Encounter (Signed)
Would recommend a renal artery U/S for stenosis This can cause BP to go up OK to do in [place] as he normal does if he can get in soon  While we are waiting for u/s result Could start labetolol 100 mg twice a day After a few days, if bp elevated, would increase up to 200 mg twice a day Would send in 200 mg pills (break in 1/2 to start)

## 2016-09-10 NOTE — Telephone Encounter (Signed)
Pt wife is calling back with fax # for his rx Zetia. [phone]. This is gotten from [place] [facility]

## 2016-09-11 MED ORDER — LABETALOL HCL 200 MG PO TABS: 200.0000 mg | ORAL_TABLET | Freq: Two times a day (BID) | ORAL | 6 refills | Status: DC

## 2016-09-11 NOTE — Telephone Encounter (Signed)
Spoke w/ pt's wife.  Advised her of Dr. Donivan Scull recommendation.  Pt is sched for renal u/s 10/24.   Asked her to continue to monitor BP and call if readings do not improve.

## 2016-09-11 NOTE — Addendum Note (Signed)
Addended by: Dede Query R on: 09/11/2016 10:30 AM   Modules accepted: Orders

## 2016-10-11 ENCOUNTER — Encounter: Payer: Self-pay | Admitting: Podiatry

## 2016-10-11 ENCOUNTER — Ambulatory Visit (INDEPENDENT_AMBULATORY_CARE_PROVIDER_SITE_OTHER): Payer: Medicare HMO | Admitting: Podiatry

## 2016-10-11 DIAGNOSIS — L851 Acquired keratosis [keratoderma] palmaris et plantaris: Secondary | ICD-10-CM

## 2016-10-11 DIAGNOSIS — L84 Corns and callosities: Secondary | ICD-10-CM | POA: Diagnosis not present

## 2016-10-11 DIAGNOSIS — M79672 Pain in left foot: Secondary | ICD-10-CM

## 2016-10-11 DIAGNOSIS — M79671 Pain in right foot: Secondary | ICD-10-CM

## 2016-10-13 NOTE — Progress Notes (Signed)
Subjective: Patient presents to the office today for chief complaint of painful callus lesions of the feet. Patient states that the pain is ongoing and is affecting their ability to ambulate without pain. Patient presents today for further treatment and evaluation.  Objective:  Physical Exam General: Alert and oriented x3 in no acute distress  Dermatology: Hyperkeratotic lesion present on the plantar aspect of the fifth MPJ right foot with a central core. Pain on palpation with a central nucleated core noted.  Skin is warm, dry and supple bilateral lower extremities. Negative for open lesions or macerations.  Vascular: Palpable pedal pulses bilaterally. No edema or erythema noted. Capillary refill within normal limits.  Neurological: Epicritic and protective threshold grossly intact bilaterally.   Musculoskeletal Exam: Pain on palpation at the keratotic lesion noted. Range of motion within normal limits bilateral. Muscle strength 5/5 in all groups bilateral.  Assessment: #1 porokeratosis fifth MPJ right foot #2 pain in right foot   Plan of Care:  #1 Patient evaluated #2 Excisional debridement of  keratoic lesion using a chisel blade was performed without incident.  #3 Treated area(s) with Salinocaine and dressed with light dressing. #4 Patient is to return to the clinic PRN.   Brent M. Evans, DPM [facility]   

## 2016-10-18 ENCOUNTER — Encounter: Payer: Self-pay | Admitting: Family

## 2016-10-22 ENCOUNTER — Ambulatory Visit (HOSPITAL_COMMUNITY)
Admission: RE | Admit: 2016-10-22 | Discharge: 2016-10-22 | Disposition: A | Discharge status: Home / Self Care | Payer: Medicare HMO | Source: Ambulatory Visit | Attending: Family | Admitting: Family

## 2016-10-22 ENCOUNTER — Ambulatory Visit (INDEPENDENT_AMBULATORY_CARE_PROVIDER_SITE_OTHER)
Admission: RE | Admit: 2016-10-22 | Discharge: 2016-10-22 | Disposition: A | Discharge status: Home / Self Care | Payer: Medicare HMO | Source: Ambulatory Visit | Attending: Family | Admitting: Family

## 2016-10-22 ENCOUNTER — Encounter: Payer: Self-pay | Admitting: Family

## 2016-10-22 ENCOUNTER — Ambulatory Visit (INDEPENDENT_AMBULATORY_CARE_PROVIDER_SITE_OTHER): Payer: Medicare HMO | Admitting: Family

## 2016-10-22 VITALS — BP 110/68 | HR 77 | Temp 97.2°F | Resp 16 | Ht 69.0 in | Wt 182.0 lb

## 2016-10-22 DIAGNOSIS — I6523 Occlusion and stenosis of bilateral carotid arteries: Secondary | ICD-10-CM | POA: Insufficient documentation

## 2016-10-22 DIAGNOSIS — I7789 Other specified disorders of arteries and arterioles: Secondary | ICD-10-CM | POA: Insufficient documentation

## 2016-10-22 DIAGNOSIS — Z95828 Presence of other vascular implants and grafts: Secondary | ICD-10-CM

## 2016-10-22 DIAGNOSIS — I779 Disorder of arteries and arterioles, unspecified: Secondary | ICD-10-CM | POA: Insufficient documentation

## 2016-10-22 DIAGNOSIS — Z9889 Other specified postprocedural states: Secondary | ICD-10-CM

## 2016-10-22 DIAGNOSIS — I701 Atherosclerosis of renal artery: Secondary | ICD-10-CM | POA: Insufficient documentation

## 2016-10-22 DIAGNOSIS — Z48812 Encounter for surgical aftercare following surgery on the circulatory system: Secondary | ICD-10-CM

## 2016-10-22 DIAGNOSIS — I6521 Occlusion and stenosis of right carotid artery: Secondary | ICD-10-CM | POA: Diagnosis not present

## 2016-10-22 DIAGNOSIS — I6522 Occlusion and stenosis of left carotid artery: Secondary | ICD-10-CM

## 2016-10-22 LAB — VAS US CAROTID
LCCADDIAS: -12 cm/s
LCCAPSYS: 85 cm/s
LEFT ECA DIAS: -35 cm/s
Left CCA dist sys: -45 cm/s
Left CCA prox dias: 11 cm/s
RCCAPSYS: 141 cm/s
RIGHT CCA MID DIAS: 29 cm/s
Right CCA prox dias: 19 cm/s
Right cca dist sys: -179 cm/s

## 2016-10-22 NOTE — Patient Instructions (Signed)
Stroke Prevention Some medical conditions and behaviors are associated with an increased chance of having a stroke. You may prevent a stroke by making healthy choices and managing medical conditions. HOW CAN I REDUCE MY RISK OF HAVING A STROKE?   Stay physically active. Get at least 30 minutes of activity on most or all days.  Do not smoke. It may also be helpful to avoid exposure to secondhand smoke.  Limit alcohol use. Moderate alcohol use is considered to be:  No more than 2 drinks per day for men.  No more than 1 drink per day for nonpregnant women.  Eat healthy foods. This involves:  Eating 5 or more servings of fruits and vegetables a day.  Making dietary changes that address high blood pressure (hypertension), high cholesterol, diabetes, or obesity.  Manage your cholesterol levels.  Making food choices that are high in fiber and low in saturated fat, trans fat, and cholesterol may control cholesterol levels.  Take any prescribed medicines to control cholesterol as directed by your health care provider.  Manage your diabetes.  Controlling your carbohydrate and sugar intake is recommended to manage diabetes.  Take any prescribed medicines to control diabetes as directed by your health care provider.  Control your hypertension.  Making food choices that are low in salt (sodium), saturated fat, trans fat, and cholesterol is recommended to manage hypertension.  Ask your health care provider if you need treatment to lower your blood pressure. Take any prescribed medicines to control hypertension as directed by your health care provider.  If you are 18-39 years of age, have your blood pressure checked every 3-5 years. If you are 40 years of age or older, have your blood pressure checked every year.  Maintain a healthy weight.  Reducing calorie intake and making food choices that are low in sodium, saturated fat, trans fat, and cholesterol are recommended to manage  weight.  Stop drug abuse.  Avoid taking birth control pills.  Talk to your health care provider about the risks of taking birth control pills if you are over 35 years old, smoke, get migraines, or have ever had a blood clot.  Get evaluated for sleep disorders (sleep apnea).  Talk to your health care provider about getting a sleep evaluation if you snore a lot or have excessive sleepiness.  Take medicines only as directed by your health care provider.  For some people, aspirin or blood thinners (anticoagulants) are helpful in reducing the risk of forming abnormal blood clots that can lead to stroke. If you have the irregular heart rhythm of atrial fibrillation, you should be on a blood thinner unless there is a good reason you cannot take them.  Understand all your medicine instructions.  Make sure that other conditions (such as anemia or atherosclerosis) are addressed. SEEK IMMEDIATE MEDICAL CARE IF:   You have sudden weakness or numbness of the face, arm, or leg, especially on one side of the body.  Your face or eyelid droops to one side.  You have sudden confusion.  You have trouble speaking (aphasia) or understanding.  You have sudden trouble seeing in one or both eyes.  You have sudden trouble walking.  You have dizziness.  You have a loss of balance or coordination.  You have a sudden, severe headache with no known cause.  You have new chest pain or an irregular heartbeat. Any of these symptoms may represent a serious problem that is an emergency. Do not wait to see if the symptoms will   go away. Get medical help at once. Call your local emergency services (911 in [place] Do not drive yourself to the hospital.   This information is not intended to replace advice given to you by your health care provider. Make sure you discuss any questions you have with your health care provider.   Document Released: 01/23/2005 Document Revised: 01/06/2015 Document Reviewed:  06/18/2013 Elsevier Interactive Patient Education 2016 [employer]     Peripheral Vascular Disease Peripheral vascular disease (PVD) is a disease of the blood vessels that are not part of your heart and brain. A simple term for PVD is poor circulation. In most cases, PVD narrows the blood vessels that carry blood from your heart to the rest of your body. This can result in a decreased supply of blood to your arms, legs, and internal organs, like your stomach or kidneys. However, it most often affects a person's lower legs and feet. There are two types of PVD.  Organic PVD. This is the more common type. It is caused by damage to the structure of blood vessels.  Functional PVD. This is caused by conditions that make blood vessels contract and tighten (spasm). Without treatment, PVD tends to get worse over time. PVD can also lead to acute ischemic limb. This is when an arm or limb suddenly has trouble getting enough blood. This is a medical emergency. CAUSES Each type of PVD has many different causes. The most common cause of PVD is buildup of a fatty material (plaque) inside of your arteries (atherosclerosis). Small amounts of plaque can break off from the walls of the blood vessels and become lodged in a smaller artery. This blocks blood flow and can cause acute ischemic limb. Other common causes of PVD include:  Blood clots that form inside of blood vessels.  Injuries to blood vessels.  Diseases that cause inflammation of blood vessels or cause blood vessel spasms.  Health behaviors and health history that increase your risk of developing PVD. RISK FACTORS  You may have a greater risk of PVD if you:  Have a family history of PVD.  Have certain medical conditions, including:  High cholesterol.  Diabetes.  High blood pressure (hypertension).  Coronary heart disease.  Past problems with blood clots.  Past injury, such as burns or a broken bone. These may have damaged blood  vessels in your limbs.  Buerger disease. This is caused by inflamed blood vessels in your hands and feet.  Some forms of arthritis.  Rare birth defects that affect the arteries in your legs.  Use tobacco.  Do not get enough exercise.  Are obese.  Are age 34 or older. SIGNS AND SYMPTOMS  PVD may cause many different symptoms. Your symptoms depend on what part of your body is not getting enough blood. Some common signs and symptoms include:  Cramps in your lower legs. This may be a symptom of poor leg circulation (claudication).  Pain and weakness in your legs while you are physically active that goes away when you rest (intermittent claudication).  Leg pain when at rest.  Leg numbness, tingling, or weakness.  Coldness in a leg or foot, especially when compared with the other leg.  Skin or hair changes. These can include:  Hair loss.  Shiny skin.  Pale or bluish skin.  Thick toenails.  Inability to get or maintain an erection (erectile dysfunction). People with PVD are more prone to developing ulcers and sores on their toes, feet, or legs. These may take longer  normal to heal. DIAGNOSIS Your health care provider may diagnose PVD from your signs and symptoms. The health care provider will also do a physical exam. You may have tests to find out what is causing your PVD and determine its severity. Tests may include:  Blood pressure recordings from your arms and legs and measurements of the strength of your pulses (pulse volume recordings).  Imaging studies using sound waves to take pictures of the blood flow through your blood vessels (Doppler ultrasound).  Injecting a dye into your blood vessels before having imaging studies using:  X-rays (angiogram or arteriogram).  Computer-generated X-rays (CT angiogram).  A powerful electromagnetic field and a computer (magnetic resonance angiogram or MRA). TREATMENT Treatment for PVD depends on the cause of your condition  and the severity of your symptoms. It also depends on your age. Underlying causes need to be treated and controlled. These include long-lasting (chronic) conditions, such as diabetes, high cholesterol, and high blood pressure. You may need to first try making lifestyle changes and taking medicines. Surgery may be needed if these do not work. Lifestyle changes may include:  Quitting smoking.  Exercising regularly.  Following a low-fat, low-cholesterol diet. Medicines may include:  Blood thinners to prevent blood clots.  Medicines to improve blood flow.  Medicines to improve your blood cholesterol levels. Surgical procedures may include:  A procedure that uses an inflated balloon to open a blocked artery and improve blood flow (angioplasty).  A procedure to put in a tube (stent) to keep a blocked artery open (stent implant).  Surgery to reroute blood flow around a blocked artery (peripheral bypass surgery).  Surgery to remove dead tissue from an infected wound on the affected limb.  Amputation. This is surgical removal of the affected limb. This may be necessary in cases of acute ischemic limb that are not improved through medical or surgical treatments. HOME CARE INSTRUCTIONS  Take medicines only as directed by your health care provider.  Do not use any tobacco products, including cigarettes, chewing tobacco, or electronic cigarettes. If you need help quitting, ask your health care provider.  Lose weight if you are overweight, and maintain a healthy weight as directed by your health care provider.  Eat a diet that is low in fat and cholesterol. If you need help, ask your health care provider.  Exercise regularly. Ask your health care provider to suggest some good activities for you.  Use compression stockings or other mechanical devices as directed by your health care provider.  Take good care of your feet.  Wear comfortable shoes that fit well.  Check your feet often for  any cuts or sores. SEEK MEDICAL CARE IF:  You have cramps in your legs while walking.  You have leg pain when you are at rest.  You have coldness in a leg or foot.  Your skin changes.  You have erectile dysfunction.  You have cuts or sores on your feet that are not healing. SEEK IMMEDIATE MEDICAL CARE IF:  Your arm or leg turns cold and blue.  Your arms or legs become red, warm, swollen, painful, or numb.  You have chest pain or trouble breathing.  You suddenly have weakness in your face, arm, or leg.  You become very confused or lose the ability to speak.  You suddenly have a very bad headache or lose your vision.   This information is not intended to replace advice given to you by your health care provider. Make sure you discuss any questions   questions you have with your health care provider.   Document Released: 01/23/2005 Document Revised: 01/06/2015 Document Reviewed: 05/26/2014 Elsevier Interactive Patient Education 2016 [employer]     Renal Artery Stenosis Renal artery stenosis (RAS) is narrowing of the artery that carries blood to your kidneys. It can affect one or both kidneys. Your kidneys filter waste and extra fluid from your blood. You get rid of the waste and fluid when you urinate. Your kidneys also make an important chemical messenger (hormone) called renin. Renin helps regulate your blood pressure. The first sign of RAS may be high blood pressure. Over time, other symptoms can develop. CAUSES  Plaque buildup in your arteries (atherosclerosis) is the main cause of RAS. The plaques that cause this are made up of:  Fat.  Cholesterol.  Calcium.  Other substances. As these substances build up in your renal artery, this slows the blood supply to your kidneys. The lack of blood and oxygen causes the signs and symptoms of RAS. A much less common cause of RAS is a disease called fibromuscular dysplasia. This disease causes abnormal cell growth that narrows the  renal artery. It is not related to atherosclerosis. It occurs mostly in women who are 80-63 years old. It may be passed down through families. RISK FACTORS  You may be at risk for renal artery stenosis if you:  Are a man who is at least 73 years old.  Are a woman who is at least 73 years old.  Have high blood pressure.  Have high cholesterol.  Are a smoker.  Abuse alcohol.  Have diabetes or prediabetes.  Are overweight.  Have a family history of early heart disease. SIGNS AND SYMPTOMS  RAS usually develops slowly. You may not have any signs or symptoms at first. The earliest signs may be:  Developing high blood pressure.  A sudden increase in existing high blood pressure.  No longer responding to medicine that used to control your blood pressure. Later signs and symptoms are due to kidney damage. They may include:  Fatigue.  Shortness of breath.  Swollen legs and feet.  Dry skin.  Headaches.  Muscle cramps.  Loss of appetite.  Nausea or vomiting. DIAGNOSIS  Your health care provider may suspect RAS based on changes in your blood pressure and your risk factors. A physical exam will be done. Your health care provider may use a stethoscope to listen for a whooshing sound (bruit) that can occur where the renal artery is blocking blood flow. Several tests may be done to confirm a diagnosis of RAS. These may include:  Blood and urine tests to check your kidney function.  Imaging tests of your kidneys, such as:  A test that involves using sound waves to create an image of your kidneys and the blood flow to your kidneys (ultrasound).  A test in which dye is injected into one of your blood vessels so images can be taken as the dye flows through your renal arteries (angiogram). These tests can be done using X-rays, a CT scan (computed tomography angiogram, CTA), or a type of MRI (magnetic resonance angiogram, MRA). TREATMENT  Making lifestyle changes to reduce your risk  factors is the first treatment option for early RAS. If the blood flow to one of your kidneys is cut by more than half, you may need medicine to:  Lower your blood pressure. This is the main medical treatment for RAS. You may need more than one type of medicine for this. The two  types that work best for RAS are:  ACE inhibitors.  Angiotensin receptor blockers.  Reduce fluid in the body (diuretics).  Lower your cholesterol (statins). If medicine is not enough to control RAS, you may need surgery. This may involve:  Threading a tube with an inflatable balloon into the renal artery to force it open (angioplasty).  Removing plaque from inside the artery (endarterectomy). HOME CARE INSTRUCTIONS  Take medicines only as directed by your health care provider.  Make any lifestyle changes recommended by your health care provider. This may include:  Working with a dietitian to maintain a heart-healthy diet. This type of diet is low in saturated fat, salt, and added sugar.  Starting an exercise program as directed by your health care provider.  Maintaining a healthy weight.  Quitting smoking.  Not abusing alcohol.  Keep all follow-up visits as directed by your health care provider. This is important. SEEK MEDICAL CARE IF:  Your symptoms of RAS are not getting better.  Your symptoms are changing or getting worse. SEEK IMMEDIATE MEDICAL CARE IF:  You have very bad pain in your back or abdomen.  You have blood in your urine.   This information is not intended to replace advice given to you by your health care provider. Make sure you discuss any questions you have with your health care provider.   Document Released: 09/11/2005 Document Revised: 01/06/2015 Document Reviewed: 03/31/2014 Elsevier Interactive Patient Education [employer].

## 2016-10-22 NOTE — Progress Notes (Signed)
[facility] [place] HISTORY AND PHYSICAL   MRN : [id number]  History of Present Illness:   Clifford Aguilar is a 75 y.o. male patient of Dr. Donnetta Hutching who returns today for followup of diffuse peripheral vascular occlusive disease. He is status post prior right carotid endarterectomy in 1993; he has a known occlusion of his left internal carotid artery. He is status post bilateral renal artery stenting. Most recently his right renal artery stenitng was in March 2015. He is status post left iliac artery stenting as well.  He does have claudication but is able to walk and do his routine activities despite this. He denies any neurologic deficits. He has been stable from a cardiac standpoint. He reports that he has some hypertension, blood pressure is rarely greater than [id number] systolic Pt denies any history of stroke or TIA, denies dizziness, denies steal symptoms in either hand/arm.  He walks on his treadmill 15 minutes and stationary bike 15 minutes, 4 days/week, swims once/week.  He walks 45 minutes 4+ days/week in his yard with several breaks until claudication starts. After 5 minutes walking both calves start to hurt, right worse than lef, relieved by rest.  He denies non healing wounds.    Pt Diabetic: No Pt smoker: former smoker, quit in 1992  Pt meds include: Statin :Yes Betablocker: No ASA: Yes Other anticoagulants/antiplatelets: Plavix    Current Outpatient Prescriptions  Medication Sig Dispense Refill  . aspirin 81 MG tablet Take 81 mg by mouth daily.     Marland Kitchen atorvastatin (LIPITOR) 40 MG tablet Take 1 tablet (40 mg total) by mouth daily. 90 tablet 3  . clopidogrel (PLAVIX) 75 MG tablet Take 1 tablet (75 mg total) by mouth daily. 90 tablet 3  . Coenzyme Q10 (CO Q-10) 100 MG CAPS Take 100 mg by mouth daily.    Marland Kitchen doxazosin (CARDURA) 8 MG tablet Take 1 tablet (8 mg total) by mouth 2 (two) times daily. 60 tablet 11  . ezetimibe (ZETIA) 10 MG tablet Take 1 tablet  (10 mg total) by mouth daily. 30 tablet 6  . GARLIC PO Take [id number] mg by mouth. Takes 7 tablets daily.    . hydrochlorothiazide (HYDRODIURIL) 25 MG tablet Take 1 tablet (25 mg total) by mouth daily. 90 tablet 3  . labetalol (NORMODYNE) 200 MG tablet Take 1 tablet (200 mg total) by mouth 2 (two) times daily. (Patient taking differently: Take 200 mg by mouth 2 (two) times daily. Takes 1 1/2 tabs per day.) 30 tablet 6  . lansoprazole (PREVACID) 15 MG capsule Take 1 capsule (15 mg total) by mouth daily at 12 noon. (Patient taking differently: Take 30 mg by mouth daily at 12 noon. ) 30 capsule 6  . levothyroxine (SYNTHROID, LEVOTHROID) 25 MCG tablet Take 1 tablet (25 mcg total) by mouth daily before breakfast. 90 tablet 3  . nitroGLYCERIN (NITROLINGUAL) 0.4 MG/SPRAY spray Place 1 spray under the tongue as needed for chest pain. 12 g 3  . nortriptyline (PAMELOR) 25 MG capsule Four capsules at bedtime 360 capsule 3  . polyethylene glycol (MIRALAX / GLYCOLAX) packet Take 17 g by mouth daily. 90 packet 3  . triamcinolone cream (KENALOG) 0.1 %     . Zn-Pyg Afri-Nettle-Saw Palmet (SAW PALMETTO COMPLEX PO) Take 1,725 mg by mouth daily.      No current facility-administered medications for this visit.     Past Medical History:  Diagnosis Date  . Cancer ([place])    basel cell carcinoma left  ear  . Carotid artery occlusion    s/p right carotid endarterectomy  . Chronic kidney disease    renal artery stenosis  . Coronary artery disease    s/p coronary bypass graft surgery in 1992, stable  . GERD (gastroesophageal reflux disease)   . Hyperlipidemia   . Hypertension   . Myocardial infarction   . PVD (peripheral vascular disease) ([place])    s/p multiple revascularization procedures.  . Shortness of breath     Social History Social History  Substance Use Topics  . Smoking status: Former Smoker    Types: Cigarettes    Quit date: 12/30/1990  . Smokeless tobacco: Former Systems developer    Quit date: 01/10/1991      Comment: quit smoking in 1992  . Alcohol use 0.6 - 1.2 oz/week    1 - 2 Glasses of wine per week     Comment: 1-2 cans of beer per week    Family History Family History  Problem Relation Age of Onset  . Stroke Mother   . Heart disease Other   . Stroke Sister   . Stroke Brother     Surgical History Past Surgical History:  Procedure Laterality Date  . abdomial angiogram  03-16-2014  . ABDOMINAL ANGIOGRAM N/A 03/16/2014   Procedure: ABDOMINAL ANGIOGRAM;  Surgeon: Rosetta Posner, MD;  Location: [facility] CATH LAB;  Service: Cardiovascular;  Laterality: N/A;  . ABDOMINAL AORTAGRAM N/A 12/03/2011   Procedure: ABDOMINAL AORTAGRAM;  Surgeon: Serafina Mitchell, MD;  Location: [facility] CATH LAB;  Service: Cardiovascular;  Laterality: N/A;  . CAROTID ENDARTERECTOMY     Right. With 46% stenosis in the right by recent Doppler  . COLONOSCOPY WITH PROPOFOL N/A 03/12/2016   Procedure: COLONOSCOPY WITH PROPOFOL;  Surgeon: [name], MD;  Location: [facility] ENDOSCOPY;  Service: Endoscopy;  Laterality: N/A;  . CORONARY ARTERY BYPASS GRAFT  1992  . left common iliac  12/03/2011  . Peripheral vascular revascularization     . RENAL ANGIOGRAM  03-16-2014  . RENAL ANGIOGRAM Left 12/03/2011   Procedure: RENAL ANGIOGRAM;  Surgeon: Serafina Mitchell, MD;  [facility] C[facility] LAB;  Service: Cardiovascular;  Laterality: Left;  lt renal artery stent  . RENAL ANGIOGRAM N/A 03/16/2014   Procedure: RENAL ANGIOGRAM;  Surgeon: Rosetta Posner, MD;  Location: [facility] CATH LAB;  Service: Cardiovascular;  Laterality: N/A;  . renal artery stenosis  12/03/2011  . SHOULDER ARTHROSCOPY     Right Amnioplasty; Acromioclavicular joint resection and arthroscopic debridement  . Surgery for Sleep Apnea      Allergies  Allergen Reactions  . Hydralazine Rash  . Adhesive [Tape]     SKIN SENSITIVITY  . Benazepril     Nausea, vomiting and diarrhea  . Carvedilol     Blurred visino  . Clonidine Derivatives Other (See Comments)    Caused blindness in left  eye for the days on the drug  . Lisinopril Other (See Comments)    Blurred vision   . Hydrazine Yellow [Tartrazine] Itching and Rash  . Isosorbide Dinitrate Rash  . Isosorbide Mononitrate Rash    Current Outpatient Prescriptions  Medication Sig Dispense Refill  . aspirin 81 MG tablet Take 81 mg by mouth daily.     Marland Kitchen atorvastatin (LIPITOR) 40 MG tablet Take 1 tablet (40 mg total) by mouth daily. 90 tablet 3  . clopidogrel (PLAVIX) 75 MG tablet Take 1 tablet (75 mg total) by mouth daily. 90 tablet 3  . Coenzyme Q10 (CO Q-10) 100 MG  CAPS Take 100 mg by mouth daily.    Marland Kitchen doxazosin (CARDURA) 8 MG tablet Take 1 tablet (8 mg total) by mouth 2 (two) times daily. 60 tablet 11  . ezetimibe (ZETIA) 10 MG tablet Take 1 tablet (10 mg total) by mouth daily. 30 tablet 6  . GARLIC PO Take [id number] mg by mouth. Takes 7 tablets daily.    . hydrochlorothiazide (HYDRODIURIL) 25 MG tablet Take 1 tablet (25 mg total) by mouth daily. 90 tablet 3  . labetalol (NORMODYNE) 200 MG tablet Take 1 tablet (200 mg total) by mouth 2 (two) times daily. (Patient taking differently: Take 200 mg by mouth 2 (two) times daily. Takes 1 1/2 tabs per day.) 30 tablet 6  . lansoprazole (PREVACID) 15 MG capsule Take 1 capsule (15 mg total) by mouth daily at 12 noon. (Patient taking differently: Take 30 mg by mouth daily at 12 noon. ) 30 capsule 6  . levothyroxine (SYNTHROID, LEVOTHROID) 25 MCG tablet Take 1 tablet (25 mcg total) by mouth daily before breakfast. 90 tablet 3  . nitroGLYCERIN (NITROLINGUAL) 0.4 MG/SPRAY spray Place 1 spray under the tongue as needed for chest pain. 12 g 3  . nortriptyline (PAMELOR) 25 MG capsule Four capsules at bedtime 360 capsule 3  . polyethylene glycol (MIRALAX / GLYCOLAX) packet Take 17 g by mouth daily. 90 packet 3  . triamcinolone cream (KENALOG) 0.1 %     . Zn-Pyg Afri-Nettle-Saw Palmet (SAW PALMETTO COMPLEX PO) Take 1,725 mg by mouth daily.      No current facility-administered medications for  this visit.      REVIEW OF SYSTEMS: See HPI for pertinent positives and negatives.  Physical Examination Vitals:   10/22/16 1019 10/22/16 1022  BP: (!) 125/58 110/68  Pulse: 77   Resp: 16   Temp: 97.2 F (36.2 C)   TempSrc: Oral   SpO2: 92%   Weight: 182 lb (82.6 kg)   Height: 5\' 9"  (1.753 m)    Body mass index is 26.88 kg/m.  General:  WDWN in NAD Gait: Normal HENT: WNL Eyes: Pupils equal Pulmonary: normal non-labored breathing, good air movement in all fields, no rales, rhonchi, or wheezing Cardiac: RRR, no murmur detected  Abdomen: soft, NT, no masses palpated Skin: no rashes, no ulcers; no cellulitis.   VASCULAR EXAM  Carotid Bruits Right Left   Positive Positive        Aorta is not palpable Radial pulses are 1+ palpable right, and 2+ palpable left                     VASCULAR EXAM: Extremities without ischemic changes, without Gangrene; without open wounds.  LE Pulses Right Left       FEMORAL  2+ palpable  1+ palpable       POPLITEAL  not palpable  not palpable       POSTERIOR TIBIAL  not palpable  not palpable       DORSALIS PEDIS      ANTERIOR TIBIAL not palpable not palpable    Musculoskeletal: no muscle wasting or atrophy; no peripheral edema          Neurologic: A&O X 3; Appropriate Affect ;  SENSATION: normal; MOTOR FUNCTION: 5/5 Symmetric, CN 2-12 intact Speech is fluent/normal    ASSESSMENT:  Clifford Aguilar is a 75 y.o. male who is status post prior right carotid endarterectomy in 1993; he has a known occlusion of his left internal carotid artery. He is status post bilateral renal artery stenting. Most recently his right renal artery stenitng was in March 2015. He is status post left iliac artery stenting as well. He has moderate claudication in both calves, no signs of ischemia in his  feet/legs. He has no history of stroke or TIA. His blood pressure remains normal taking three antihypertensive medcations.   DATA Today's carotid duplex suggests 40-59% right ICA stenosis and known left ICA occlusion which is stable from a year ago. Right vertebral artery flow is not detected, left is antegrade.  Bilateral subclavian arteries are triphasic.  No significant change compared to previous exam on 10/20/15.  Renal artery duplex suggests patent bilateral renal artery stents. Velocities suggest >60% stenosis bilaterally (318 cm/s, 334 cm/s on the right; 332 cm/s on the left). Bilateral kidney size has decreased, velocity at the proximal right renal artery increased since previous exam on 10/20/15.    ABI's have declined slightly bialterally with severe disease in both legs: right ABI is 0.47, was 0.51; left ABI is 0.41, was 0.45; all monophasic waveforms. Right toe pressure is 50, left is 54.   Fortunately he does not have DM and he quit smoking in 1992.   PLAN:   Based on today's exam and non-invasive vascular lab results, and after discussing with Dr. Donnetta Hutching the peak systolic velocities of the bilateral renal arteries, the patient will follow up in 1 year with the following tests: ABI's, bilateral renal artery duplex, bilateral aortoiliac duplex, carotid duplex.  I discussed in depth with the patient the nature of atherosclerosis, and emphasized the importance of maximal medical management including strict control of blood pressure, blood glucose, and lipid levels, obtaining regular exercise, and cessation of smoking.  The patient is aware that without maximal medical management the underlying atherosclerotic disease process will progress, limiting the benefit of any interventions.  The patient was given information about stroke prevention and what symptoms should prompt the patient to seek immediate medical care.  The patient was given information about PAD including signs,  symptoms, treatment, what symptoms should prompt the patient to seek immediate medical care, and risk reduction measures to take. Thank you for allowing [place] to participate in this patient's care.  Clemon Chambers, RN, MSN, FNP-C [facility] Office: [phone]  Clinic MD: Early 10/22/2016 10:27 AM

## 2016-11-06 ENCOUNTER — Encounter: Payer: Self-pay | Admitting: Family

## 2016-11-18 ENCOUNTER — Other Ambulatory Visit: Payer: Self-pay | Admitting: Cardiovascular Disease

## 2016-11-18 MED ORDER — LABETALOL HCL 200 MG PO TABS: 200.0000 mg | ORAL_TABLET | Freq: Two times a day (BID) | ORAL | 3 refills | Status: DC

## 2016-11-18 NOTE — Telephone Encounter (Signed)
Requested Prescriptions   Signed Prescriptions Disp Refills  . labetalol (NORMODYNE) 200 MG tablet 180 tablet 3    Sig: Take 1 tablet (200 mg total) by mouth 2 (two) times daily.    Authorizing Provider: Minna Merritts    Ordering User: Britt Bottom

## 2016-11-18 NOTE — Telephone Encounter (Signed)
°*  STAT* If patient is at the pharmacy, call can be transferred to refill team.   1. Which medications need to be refilled? (please list name of each medication and dose if known) Labetalol 200 mg 1.5 a pill a day  2. Which pharmacy/location (including street and city if local pharmacy) is medication to be sent to? [facility]   3. Do they need a 30 day or 90 day supply? 90 day

## 2016-11-19 NOTE — Addendum Note (Signed)
Addended by: Lianne Cure A on: 11/19/2016 01:41 PM   Modules accepted: Orders

## 2016-11-20 ENCOUNTER — Telehealth: Payer: Self-pay | Admitting: Cardiovascular Disease

## 2016-11-20 ENCOUNTER — Other Ambulatory Visit: Payer: Self-pay | Admitting: *Deleted

## 2016-11-20 MED ORDER — LABETALOL HCL 200 MG PO TABS: 200.0000 mg | ORAL_TABLET | Freq: Two times a day (BID) | ORAL | 3 refills | Status: DC

## 2016-11-20 MED ORDER — DOXAZOSIN MESYLATE 8 MG PO TABS: 8.0000 mg | ORAL_TABLET | Freq: Two times a day (BID) | ORAL | 3 refills | Status: DC

## 2016-11-20 NOTE — Telephone Encounter (Signed)
° ° °  ° °*  STAT* If patient is at the pharmacy, call can be transferred to refill team.   1. Which medications need to be refilled? (please list name of each medication and dose if known) DOXAZOSIN 8 MG PO BID   2. Which pharmacy/location (including street and city if local pharmacy) is medication to be sent to?   [place] mail order fax [phone]   3. Do they need a 30 day or 90 day supply? 90  PATIENT ALSO REQUESTED A 90 DAY SUPPLY OF LABETALOL SENT TO [facility] BUT IT LOOKS LIKE IT WAS ALREADY SENT ON 01/19/16. PATIENT AWARE AND WILL CALL PHARMACY TO CLARIFY.

## 2016-11-20 NOTE — Telephone Encounter (Signed)
Requested Prescriptions   Signed Prescriptions Disp Refills  . doxazosin (CARDURA) 8 MG tablet 180 tablet 3    Sig: Take 1 tablet (8 mg total) by mouth 2 (two) times daily.    Authorizing Provider: Minna Merritts    Ordering User: Eugenio Hoes, MARINA C  . labetalol (NORMODYNE) 200 MG tablet 180 tablet 3    Sig: Take 1 tablet (200 mg total) by mouth 2 (two) times daily.    Authorizing Provider: Minna Merritts    Ordering User: Britt Bottom

## 2016-11-26 ENCOUNTER — Other Ambulatory Visit: Payer: Self-pay | Admitting: *Deleted

## 2016-11-26 DIAGNOSIS — I739 Peripheral vascular disease, unspecified: Secondary | ICD-10-CM

## 2016-11-26 MED ORDER — CLOPIDOGREL BISULFATE 75 MG PO TABS: 75.0000 mg | ORAL_TABLET | Freq: Every day | ORAL | 3 refills | Status: DC

## 2016-12-05 ENCOUNTER — Other Ambulatory Visit: Payer: Self-pay | Admitting: *Deleted

## 2016-12-05 ENCOUNTER — Telehealth: Payer: Self-pay | Admitting: Cardiovascular Disease

## 2016-12-05 MED ORDER — LABETALOL HCL 200 MG PO TABS
200.0000 mg | ORAL_TABLET | Freq: Two times a day (BID) | ORAL | 3 refills | Status: DC
Start: 2016-12-05 — End: 2017-02-17

## 2016-12-05 NOTE — Telephone Encounter (Signed)
Requested Prescriptions   Signed Prescriptions Disp Refills  . labetalol (NORMODYNE) 200 MG tablet 180 tablet 3    Sig: Take 1 tablet (200 mg total) by mouth 2 (two) times daily.    Authorizing Provider: Minna Merritts    Ordering User: Britt Bottom

## 2016-12-05 NOTE — Telephone Encounter (Signed)
°*  STAT* If patient is at the pharmacy, call can be transferred to refill team.   1. Which medications need to be refilled? (please list name of each medication and dose if known) Labetalol 200 mg   2. Which pharmacy/location (including street and city if local pharmacy) is medication to be sent to? [facility]   3. Do they need a 30 day or 90 day supply? 90 day

## 2016-12-25 ENCOUNTER — Encounter: Payer: Self-pay | Admitting: Cardiovascular Disease

## 2016-12-25 ENCOUNTER — Other Ambulatory Visit: Payer: Self-pay | Admitting: *Deleted

## 2016-12-25 MED ORDER — EZETIMIBE 10 MG PO TABS: 10.0000 mg | ORAL_TABLET | Freq: Every day | ORAL | 3 refills | Status: DC

## 2016-12-25 NOTE — Telephone Encounter (Signed)
Requested Prescriptions   Signed Prescriptions Disp Refills   ezetimibe (ZETIA) 10 MG tablet 90 tablet 3    Sig: Take 1 tablet (10 mg total) by mouth daily.    Authorizing Provider: GOLLAN, TIMOTHY J    Ordering User: LOPEZ, MARINA C    

## 2016-12-25 NOTE — Telephone Encounter (Signed)
*  STAT* If patient is at the pharmacy, call can be transferred to refill team.   1. Which medications need to be refilled? (please list name of each medication and dose if known) ezetimibe (ZETIA) 10 MG tablet  2. Which pharmacy/location (including street and city if local pharmacy) is medication to be sent to? [facility] in [place] (medication is cheaper at [facility])  3. Do they need a 30 day or 90 day supply? 90 day

## 2017-02-02 ENCOUNTER — Other Ambulatory Visit: Payer: Self-pay | Admitting: Vascular Surgery

## 2017-02-02 DIAGNOSIS — I739 Peripheral vascular disease, unspecified: Secondary | ICD-10-CM

## 2017-02-17 ENCOUNTER — Encounter: Payer: Self-pay | Admitting: Cardiovascular Disease

## 2017-02-17 ENCOUNTER — Ambulatory Visit (INDEPENDENT_AMBULATORY_CARE_PROVIDER_SITE_OTHER): Payer: Medicare HMO | Admitting: Cardiovascular Disease

## 2017-02-17 VITALS — BP 132/70 | HR 52 | Ht 69.0 in | Wt 189.5 lb

## 2017-02-17 DIAGNOSIS — I2510 Atherosclerotic heart disease of native coronary artery without angina pectoris: Secondary | ICD-10-CM | POA: Diagnosis not present

## 2017-02-17 DIAGNOSIS — I25718 Atherosclerosis of autologous vein coronary artery bypass graft(s) with other forms of angina pectoris: Secondary | ICD-10-CM | POA: Diagnosis not present

## 2017-02-17 DIAGNOSIS — I739 Peripheral vascular disease, unspecified: Secondary | ICD-10-CM | POA: Diagnosis not present

## 2017-02-17 DIAGNOSIS — E782 Mixed hyperlipidemia: Secondary | ICD-10-CM

## 2017-02-17 DIAGNOSIS — I6523 Occlusion and stenosis of bilateral carotid arteries: Secondary | ICD-10-CM

## 2017-02-17 DIAGNOSIS — I10 Essential (primary) hypertension: Secondary | ICD-10-CM

## 2017-02-17 MED ORDER — LOSARTAN POTASSIUM 100 MG PO TABS: 100.0000 mg | ORAL_TABLET | Freq: Every day | ORAL | 6 refills | Status: DC

## 2017-02-17 MED ORDER — LABETALOL HCL 200 MG PO TABS: 200.0000 mg | ORAL_TABLET | ORAL | 3 refills | Status: DC | PRN

## 2017-02-17 MED ORDER — HYDROCHLOROTHIAZIDE 25 MG PO TABS: 25.0000 mg | ORAL_TABLET | Freq: Every day | ORAL | 3 refills | Status: DC

## 2017-02-17 NOTE — Progress Notes (Signed)
Cardiology Office Note  Date:  02/17/2017   ID:  Wake, Acre 1941/08/28, MRN [id number]  PCP:  Wilhemena Durie, MD   Chief Complaint  Patient presents with  . other    6 month f/u c/o leg irritation/itchy. Pt complaints about labetalol. Meds reviewed verbally with pt.    HPI:  76 years old with CAD, peripheral vascular disease, bypass surgery in 1992, carotid endarterectomy on the right with residual 60-70% disease on last ultrasound in 2013 , chronically occluded left carotid artery, multiple surgical procedures on his lower extremity by Dr. Donnetta Hutching, left iliac stent and left renal angioplasty with Dr. Trula Slade on 12/03/2011, who presents for routine followup of his CAD, s/p CABG chronic leg pain with overexertion  S/p renal stent for severe HTN. He does have significant celiac and superior mesenteric arterial disease estimated at greater than 75%. ABIs 0.5 or less bilaterally April 2013.  In follow-up today he reports having significant fatigue, labile blood pressure which he attributes to the labetalol. He is taking one full pill in the morning, only takes a low dose in the evening, possibly half pill. Her pressure seems to go up and down Falls asleep while sitting there. Otherwise he feels well  Previously had issues with benazepril, GI problems, diarrhea Symptoms improved by stopping benazepril Minimal leg edema, takes HCTZ daily Takes Cardura 8 mg twice a day Also tolerating Lipitor and Zetia  Previous carotid ultrasound Carotid 10/16 40 to 59% on the right Followed in [place]  7 pound weight gain in the past year  EKG on today's visit shows normal sinus rhythm with rate 57 bpm, no significant ST or T-wave changes  Other past medical history reviewed in January 2015, he developed symptoms of "head heaviness ", with discomfort in the back of his neck He took a NTG, and subsequently his BP dropped, he had syncope, family called 911, he was taken by emt  to [facility] It was felt his hypotension related to treatment with Cardura in addition to taking sublingual nitroglycerin in the standing position. He ruled out for myocardial infarction. He underwent a nuclear stress test which showed no evidence of ischemia. Echocardiogram was also unremarkable. He was discharged home on hydralazine in addition to his other blood pressure medications. Since then he reports stopping hydralazine secondary to rash.  In fact he has numerous medications that have given him a rash   3/15 he was seen by Dr. Donnetta Hutching Found to have renal artery stenosis that had progressed,  3/18 he had catheterization for renal artery stent placement  Previous history of blurry vision episodes.   He has not had a cardiac catheterization or stress test in 10 years. He believes he had a catheterization 10 years ago, 3 catheterizations since his bypass surgery 20 years ago.  He quit smoking after his bypass surgery in 1992.  Allergies: imdur/isoorbide dinitrate, lisinsopril, clonidine, hydralazine, coreg,  Benazepril   PMH:   has a past medical history of Cancer ([place]); Carotid artery occlusion; Chronic kidney disease; Coronary artery disease; GERD (gastroesophageal reflux disease); Hyperlipidemia; Hypertension; Myocardial infarction; PVD (peripheral vascular disease) ([place]); and Shortness of breath.  PSH:    Past Surgical History:  Procedure Laterality Date  . abdomial angiogram  03-16-2014  . ABDOMINAL ANGIOGRAM N/A 03/16/2014   Procedure: ABDOMINAL ANGIOGRAM;  Surgeon: Rosetta Posner, MD;  Location: [facility] CATH LAB;  Service: Cardiovascular;  Laterality: N/A;  . ABDOMINAL AORTAGRAM N/A 12/03/2011   Procedure: ABDOMINAL Maxcine Ham;  Surgeon: Butch Penny  Trula Slade, MD;  Location: [place] CATH LAB;  Service: Cardiovascular;  Laterality: N/A;  . CAROTID ENDARTERECTOMY     Right. With 46% stenosis in the right by recent Doppler  . COLONOSCOPY WITH PROPOFOL N/A 03/12/2016   Procedure: COLONOSCOPY  WITH PROPOFOL;  Surgeon: [name], MD;  Location: [facility] ENDOSCOPY;  Service: Endoscopy;  Laterality: N/A;  . CORONARY ARTERY BYPASS GRAFT  1992  . left common iliac  12/03/2011  . Peripheral vascular revascularization     . RENAL ANGIOGRAM  03-16-2014  . RENAL ANGIOGRAM Left 12/03/2011   Procedure: RENAL ANGIOGRAM;  Surgeon: Serafina Mitchell, MD;  Location: [facility] CATH LAB;  Service: Cardiovascular;  Laterality: Left;  lt renal artery stent  . RENAL ANGIOGRAM N/A 03/16/2014   Procedure: RENAL ANGIOGRAM;  Surgeon: Rosetta Posner, MD;  Location: [facility] CATH LAB;  Service: Cardiovascular;  Laterality: N/A;  . renal artery stenosis  12/03/2011  . SHOULDER ARTHROSCOPY     Right Amnioplasty; Acromioclavicular joint resection and arthroscopic debridement  . Surgery for Sleep Apnea      Current Outpatient Prescriptions  Medication Sig Dispense Refill  . aspirin 81 MG tablet Take 81 mg by mouth daily.     Marland Kitchen atorvastatin (LIPITOR) 40 MG tablet Take 1 tablet (40 mg total) by mouth daily. 90 tablet 3  . clopidogrel (PLAVIX) 75 MG tablet Take 1 tablet (75 mg total) by mouth daily. 90 tablet 3  . Coenzyme Q10 (CO Q-10) 100 MG CAPS Take 100 mg by mouth daily.    Marland Kitchen doxazosin (CARDURA) 8 MG tablet Take 1 tablet (8 mg total) by mouth 2 (two) times daily. 180 tablet 3  . ezetimibe (ZETIA) 10 MG tablet Take 1 tablet (10 mg total) by mouth daily. 90 tablet 3  . GARLIC PO Take [id number] mg by mouth. Takes 7 tablets daily.    . hydrochlorothiazide (HYDRODIURIL) 25 MG tablet Take 1 tablet (25 mg total) by mouth daily. 90 tablet 3  . labetalol (NORMODYNE) 200 MG tablet Take 1 tablet (200 mg total) by mouth as needed (tachycardia). 180 tablet 3  . lansoprazole (PREVACID) 15 MG capsule Take 1 capsule (15 mg total) by mouth daily at 12 noon. (Patient taking differently: Take 30 mg by mouth daily at 12 noon. ) 30 capsule 6  . levothyroxine (SYNTHROID, LEVOTHROID) 25 MCG tablet Take 1 tablet (25 mcg total) by mouth daily before  breakfast. 90 tablet 3  . nitroGLYCERIN (NITROLINGUAL) 0.4 MG/SPRAY spray Place 1 spray under the tongue as needed for chest pain. 12 g 3  . nortriptyline (PAMELOR) 25 MG capsule Four capsules at bedtime 360 capsule 3  . polyethylene glycol (MIRALAX / GLYCOLAX) packet Take 17 g by mouth daily. 90 packet 3  . triamcinolone cream (KENALOG) 0.1 %     . Zn-Pyg Afri-Nettle-Saw Palmet (SAW PALMETTO COMPLEX PO) Take 1,725 mg by mouth daily.     Marland Kitchen losartan (COZAAR) 100 MG tablet Take 1 tablet (100 mg total) by mouth daily. 30 tablet 6   No current facility-administered medications for this visit.      Allergies:   Hydralazine; Adhesive [tape]; Benazepril; Carvedilol; Clonidine derivatives; Lisinopril; Hydrazine yellow [tartrazine]; Isosorbide dinitrate; and Isosorbide mononitrate   Social History:  The patient  reports that he quit smoking about 26 years ago. His smoking use included Cigarettes. He quit smokeless tobacco use about 26 years ago. He reports that he drinks about 0.6 - 1.2 oz of alcohol per week . He reports that he does not  use drugs.   Family History:   family history includes Heart disease in his other; Stroke in his brother, mother, and sister.    Review of Systems: Review of Systems  Constitutional: Positive for malaise/fatigue.  Respiratory: Negative.   Cardiovascular: Negative.   Gastrointestinal: Negative.   Musculoskeletal: Negative.   Neurological: Negative.   Psychiatric/Behavioral: Negative.   All other systems reviewed and are negative.    PHYSICAL EXAM: VS:  BP 132/70 (BP Location: Left Arm, Patient Position: Sitting, Cuff Size: Normal)   Pulse (!) 52   Ht 5\' 9"  (1.753 m)   Wt 189 lb 8 oz (86 kg)   BMI 27.98 kg/m  , BMI Body mass index is 27.98 kg/m. GEN: Well nourished, well developed, in no acute distress  HEENT: normal  Neck: no JVD, carotid bruits, or masses Cardiac: RRR; no murmurs, rubs, or gallops,no edema  Respiratory:  clear to auscultation  bilaterally, normal work of breathing GI: soft, nontender, nondistended, + BS MS: no deformity or atrophy  Skin: warm and dry, no rash Neuro:  Strength and sensation are intact Psych: euthymic mood, full affect    Recent Labs: No results found for requested labs within last 8760 hours.    Lipid Panel Lab Results  Component Value Date   CHOL 133 06/22/2015   HDL 35 (L) 06/22/2015   LDLCALC 63 06/22/2015   TRIG 173 (H) 06/22/2015      Wt Readings from Last 3 Encounters:  02/17/17 189 lb 8 oz (86 kg)  10/22/16 182 lb (82.6 kg)  08/19/16 182 lb 12 oz (82.9 kg)       ASSESSMENT AND PLAN:  Atherosclerosis of native coronary artery of native heart without angina pectoris - Plan: EKG 12-Lead Currently with no symptoms of angina. No further workup at this time. Continue current medication regimen.  Atherosclerosis of autologous vein coronary artery bypass graft with other forms of angina pectoris ([place]) - Plan: EKG 12-Lead  Bilateral carotid artery stenosis - Plan: EKG 12-Lead Followed in [place], stable disease  PAD (peripheral artery disease) ([place]) - Plan: EKG 12-Lead Reports stable disease, no claudication symptoms  Essential hypertension Labile blood pressures likely secondary to short acting labetalol. Has worsening fatigue likely from beta blocker. Discussed other medication options, long history of allergies. He has not tried ARB such as losartan. Recommended he try 50 mg losartan daily, stop the labetalol take this only as needed. If blood pressure runs high would increase losartan up to 100 mg daily. Other option includes low-dose amlodipine. No leg swelling on today's visit  Mixed hyperlipidemia Previous lipid panel well-controlled in 2016. Recommended he have this done with annual primary care visit. Tolerating Lipitor   Total encounter time more than 25 minutes  Greater than 50% was spent in counseling and coordination of care with the  patient   Disposition:   F/U  6 months   Orders Placed This Encounter  Procedures  . EKG 12-Lead     Signed, Esmond Plants, M.D., Ph.D. 02/17/2017  [place], [place]

## 2017-02-17 NOTE — Patient Instructions (Signed)
Medication Instructions:   Please start losartan 1/2 pill daily in Am Stop the labetolol (take only as needed for high pressure) If blood pressure runs high, We can increase the losartan up to a full pill daily  Other options include amlodipine Josem Kaufmann  Labwork:  No new labs needed  Testing/Procedures:  No further testing at this time   I recommend watching educational videos on topics of interest to you at:       [email or web address]  Enter code: [email or web address]    Follow-Up: It was a pleasure seeing you in the office today. Please call us if you have new issues that need to be addressed before your next appt.  [phone]  Your physician wants you to follow-up in: 6 months.  You will receive a reminder letter in the mail two months in advance. If you don't receive a letter, please call our office to schedule the follow-up appointment.  If you need a refill on your cardiac medications before your next appointment, please call your pharmacy.

## 2017-02-27 DIAGNOSIS — D2261 Melanocytic nevi of right upper limb, including shoulder: Secondary | ICD-10-CM | POA: Diagnosis not present

## 2017-02-27 DIAGNOSIS — D0439 Carcinoma in situ of skin of other parts of face: Secondary | ICD-10-CM | POA: Diagnosis not present

## 2017-02-27 DIAGNOSIS — L570 Actinic keratosis: Secondary | ICD-10-CM | POA: Diagnosis not present

## 2017-02-27 DIAGNOSIS — L308 Other specified dermatitis: Secondary | ICD-10-CM | POA: Diagnosis not present

## 2017-02-27 DIAGNOSIS — D485 Neoplasm of uncertain behavior of skin: Secondary | ICD-10-CM | POA: Diagnosis not present

## 2017-02-27 DIAGNOSIS — X32XXXA Exposure to sunlight, initial encounter: Secondary | ICD-10-CM | POA: Diagnosis not present

## 2017-02-27 DIAGNOSIS — Z85828 Personal history of other malignant neoplasm of skin: Secondary | ICD-10-CM | POA: Diagnosis not present

## 2017-02-27 DIAGNOSIS — C44329 Squamous cell carcinoma of skin of other parts of face: Secondary | ICD-10-CM | POA: Diagnosis not present

## 2017-02-27 DIAGNOSIS — D225 Melanocytic nevi of trunk: Secondary | ICD-10-CM | POA: Diagnosis not present

## 2017-03-01 ENCOUNTER — Other Ambulatory Visit: Payer: Self-pay | Admitting: Family Medicine

## 2017-03-01 DIAGNOSIS — G4700 Insomnia, unspecified: Secondary | ICD-10-CM

## 2017-03-06 ENCOUNTER — Telehealth: Payer: Self-pay | Admitting: Cardiovascular Disease

## 2017-03-06 NOTE — Telephone Encounter (Signed)
Spoke w/ pt.  He states that his eyesight has been worsening since starting losartan, reports blurriness in his left eye followed by his right eye. Advised pt that he may hold his losartan and monitor his BP, but recommended that he call the [facility] and see if he can be seen today. He is agreeable and will call them now. Asked him to call back if we can be of further assistance.

## 2017-03-06 NOTE — Telephone Encounter (Signed)
Pt wife states since pt has started taking Losartan, his vision has gotten blurry, she states it continually has gotten worse.

## 2017-03-06 NOTE — Telephone Encounter (Signed)
This encounter was created in error - please disregard.

## 2017-03-06 NOTE — Telephone Encounter (Signed)
Pt's wife called back stating that pt c/o burry vision w/ several of his previous BP meds and she does not feel that he needs to see an eye doctor. Advised her that I recommended to pt that he may hold losartan to see if sx improve and to make sure that he monitor his BP closely, but I have to medically recommend he have his eyes checked if he is having vision changes. She states that she will have pt hold his losartan to see if his vision improves and will keep a close eye on his BP. Asked her to call back w/ any further questions or concerns.

## 2017-03-12 ENCOUNTER — Telehealth: Payer: Self-pay | Admitting: Cardiovascular Disease

## 2017-03-12 DIAGNOSIS — H0289 Other specified disorders of eyelid: Secondary | ICD-10-CM | POA: Diagnosis not present

## 2017-03-12 NOTE — Telephone Encounter (Signed)
Patient wife calling.   Patient is having blurry vision she attributes to taking losartin .  Please call.

## 2017-03-12 NOTE — Telephone Encounter (Signed)
Patient states that he stopped taking the losartan and his eyes got better. He went for eye exam today at [facility] eye center and his vision was good as well. He states that the losartan caused the blurred vision and wanted to know if we needed to try something different. Blood pressure reading this AM was 145/60 and yesterday it was 147/60. Instructed him to continue monitoring blood pressures daily and to keep a log so we can see how they trend. Let him know that I would forward to Dr. Rockey Situ for his recommendations and that he should call back if they stay consistently greater than 140/80. He verbalized understanding of our conversation, agreement with plan, and had no further questions.

## 2017-03-13 ENCOUNTER — Ambulatory Visit (INDEPENDENT_AMBULATORY_CARE_PROVIDER_SITE_OTHER): Payer: Medicare HMO | Admitting: Family Medicine

## 2017-03-13 ENCOUNTER — Encounter: Payer: Self-pay | Admitting: Family Medicine

## 2017-03-13 VITALS — BP 122/70 | HR 90 | Temp 98.0°F | Resp 16 | Ht 68.5 in | Wt 191.0 lb

## 2017-03-13 DIAGNOSIS — N401 Enlarged prostate with lower urinary tract symptoms: Secondary | ICD-10-CM

## 2017-03-13 DIAGNOSIS — I6523 Occlusion and stenosis of bilateral carotid arteries: Secondary | ICD-10-CM

## 2017-03-13 DIAGNOSIS — Z0000 Encounter for general adult medical examination without abnormal findings: Secondary | ICD-10-CM

## 2017-03-13 DIAGNOSIS — K219 Gastro-esophageal reflux disease without esophagitis: Secondary | ICD-10-CM

## 2017-03-13 DIAGNOSIS — E782 Mixed hyperlipidemia: Secondary | ICD-10-CM | POA: Diagnosis not present

## 2017-03-13 DIAGNOSIS — I10 Essential (primary) hypertension: Secondary | ICD-10-CM

## 2017-03-13 DIAGNOSIS — G3184 Mild cognitive impairment, so stated: Secondary | ICD-10-CM

## 2017-03-13 DIAGNOSIS — I739 Peripheral vascular disease, unspecified: Secondary | ICD-10-CM

## 2017-03-13 DIAGNOSIS — E039 Hypothyroidism, unspecified: Secondary | ICD-10-CM

## 2017-03-13 DIAGNOSIS — N138 Other obstructive and reflux uropathy: Secondary | ICD-10-CM

## 2017-03-13 DIAGNOSIS — I2510 Atherosclerotic heart disease of native coronary artery without angina pectoris: Secondary | ICD-10-CM | POA: Diagnosis not present

## 2017-03-13 NOTE — Progress Notes (Addendum)
Subjective:     Patient ID: Clifford Aguilar, male   DOB: 08-Sep-1941, 76 y.o.   MRN: [id number]  HPI  Chief Complaint  Patient presents with  . Annual Exam    Patient comes in office today for his annual physical he has no complaints to address today and states that he is feeling well. Patient reports that he follows a well balanced diet, and is actively exercising (walking, tredmill) and sleeps on average of 4-5hrs a night. Patients last reported: Tdap 03/14/06, TD 01/29/16, Flu 11/2016, Prevnar 11/21/14, Pneumo 01/09/12, Shingles 03/26/07, PSA 01/30/16 (0.9 ng/mL) and colonoscopy 03/12/16.   Continues to f/u with Cardiology, Dr. Rockey Situ; vascular surgery, Dr Donnetta Hutching, dermatology, Dr. Manley Mason; podiatry, Dr. Paulla Dolly. Reports recent eye exam and regular dental visits.   Review of Systems General: Feeling well HEENT: recently discontinued losartan due to eye blurriness Cardiovascular: no chest pain, shortness of breath, or palpitations GI: heartburn controlled by medication no change in bowel habits or blood in the stool GU: nocturia x 1-2. no change in bladder habits  Neuro: Reports mild memory deficits-names- but denies getting lost while driving or leaves food on the stove. Psychiatric: not depressed Musculoskeletal: no joint pain    Objective:   Physical Exam Eyes: PERRLA, EOMI Neck: no thyromegaly, tenderness or nodules, bilateral carotid bruits, no cervical adenopathy ENT: TM's intact without inflammation; tonsils/uvula absent Lungs: Clear Heart : RRR without murmur or gallop Abd: bowel sounds present, soft, non-tender, no organomegaly Rectal: Prostate firm and non-tender, Stool heme negative Extremities: no edema, pedal pulses intact Skin: no atypical lesions noted. Neuro: 1.What year is it?          0 or 4    0 2.What month is it?       0 or 3    0 3.Remember the following address (ask patient to repeat and remember for later)                           Janice Norrie   [address] [place]  4.What time is it?          0 or 4 ----0   5. Count backwards from 20-1     0 or 2 (1 error) or 4---   0 6. Months of the year backwards       0 or 2 or 4----    2 7. Repeat previous memory phrase   0 or 3 (one error) or 4 (2 errors) or 6 (3 errors) or 8 (4 errors) or 10 (all incorrect)     6 ______________________________________________________________________ Total score:8 0-7 normal   8-9 mild cognitive impairment    10-28 significant cognitive impairment    Assessment:    1. Medicare annual wellness visit, subsequent  2. Mixed hyperlipidemia - Lipid panel  3. Benign prostatic hyperplasia with urinary obstruction - PSA  4. Hypothyroidism, unspecified type - T4, free - TSH  5. Gastroesophageal reflux disease without esophagitis: taper off PPI  6. Essential hypertension: stable today - Comprehensive metabolic panel  7. PAD (peripheral artery disease) ([place]): per vascular surgery  8. Bilateral carotid artery stenosis: per vascular surgery  9. Atherosclerosis of native coronary artery of native heart without angina pectoris: per cardiology  10. Mild cognitive impairment: monitor has had prior norma B12/folate     Plan:    Further f/u pending lab work. Discussed tapering Prevacid and substituting with Zantac if needed.

## 2017-03-13 NOTE — Telephone Encounter (Signed)
Numerous medication intolerances Previous vision problems secondary to other medications Makes it feel it is not a medication specific issue but perhaps we need to run his blood pressure high, possible vascular occlusion Would monitor blood pressure for now

## 2017-03-13 NOTE — Patient Instructions (Signed)
We will call you with the lab results. Try to taper Prevacid to every other day for two weeks then stop. If heartburn recurs may use Zantac 75 mg. To 150 mg daily.

## 2017-03-14 NOTE — Telephone Encounter (Signed)
Left message for pt call back:  

## 2017-03-17 ENCOUNTER — Other Ambulatory Visit: Payer: Self-pay | Admitting: Family Medicine

## 2017-03-17 NOTE — Telephone Encounter (Signed)
Spoke w/ pt's wife.  She reports that pt stopped losartan and his sx resolved that day. He had an eye exam which was unchanged since last year. She said that pt's PCP mentioned the same thing, that pt's vision seems to worsen w/ lower BP. She reports the following readings for the past week:  102/50, 98/58, 162/66, 161/65, 113/51, 147/64, 143/60, 175/70  She reports that pt's daughter is terminally ill and that his BP was elevated on days that they visited the hospital. She will have pt stop losartan, she will continue to monitor BP and call back if we can be of further assistance.

## 2017-03-17 NOTE — Telephone Encounter (Signed)
Pt wife is returning your call.  

## 2017-03-20 DIAGNOSIS — N138 Other obstructive and reflux uropathy: Secondary | ICD-10-CM | POA: Diagnosis not present

## 2017-03-20 DIAGNOSIS — I10 Essential (primary) hypertension: Secondary | ICD-10-CM | POA: Diagnosis not present

## 2017-03-20 DIAGNOSIS — N401 Enlarged prostate with lower urinary tract symptoms: Secondary | ICD-10-CM | POA: Diagnosis not present

## 2017-03-20 DIAGNOSIS — E039 Hypothyroidism, unspecified: Secondary | ICD-10-CM | POA: Diagnosis not present

## 2017-03-20 DIAGNOSIS — E782 Mixed hyperlipidemia: Secondary | ICD-10-CM | POA: Diagnosis not present

## 2017-03-21 ENCOUNTER — Telehealth: Payer: Self-pay

## 2017-03-21 LAB — LIPID PANEL
CHOL/HDL RATIO: 4.1 ratio (ref 0.0–5.0)
Cholesterol, Total: 141 mg/dL (ref 100–199)
HDL: 34 mg/dL — ABNORMAL LOW (ref >39)
LDL CALC: 70 mg/dL (ref 0–99)
Triglycerides: 183 mg/dL — ABNORMAL HIGH (ref 0–149)
VLDL CHOLESTEROL CAL: 37 mg/dL (ref 5–40)

## 2017-03-21 LAB — COMPREHENSIVE METABOLIC PANEL
A/G RATIO: 1.6 (ref 1.2–2.2)
ALBUMIN: 4.1 g/dL (ref 3.5–4.8)
ALT: 15 IU/L (ref 0–44)
AST: 22 IU/L (ref 0–40)
Alkaline Phosphatase: 81 IU/L (ref 39–117)
BUN / CREAT RATIO: 17 (ref 10–24)
BUN: 17 mg/dL (ref 8–27)
Bilirubin Total: 0.6 mg/dL (ref 0.0–1.2)
CALCIUM: 9.4 mg/dL (ref 8.6–10.2)
CO2: 23 mmol/L (ref 18–29)
CREATININE: 1 mg/dL (ref 0.76–1.27)
Chloride: 98 mmol/L (ref 96–106)
GFR, EST AFRICAN AMERICAN: 86 mL/min/{1.73_m2} (ref >59)
GFR, EST NON AFRICAN AMERICAN: 74 mL/min/{1.73_m2} (ref >59)
GLOBULIN, TOTAL: 2.5 g/dL (ref 1.5–4.5)
Glucose: 109 mg/dL — ABNORMAL HIGH (ref 65–99)
POTASSIUM: 4.4 mmol/L (ref 3.5–5.2)
SODIUM: 139 mmol/L (ref 134–144)
TOTAL PROTEIN: 6.6 g/dL (ref 6.0–8.5)

## 2017-03-21 LAB — T4, FREE: Free T4: 1.24 ng/dL (ref 0.82–1.77)

## 2017-03-21 LAB — TSH: TSH: 6.22 u[IU]/mL — ABNORMAL HIGH (ref 0.450–4.500)

## 2017-03-21 LAB — PSA: Prostate Specific Ag, Serum: 0.9 ng/mL (ref 0.0–4.0)

## 2017-03-21 NOTE — Telephone Encounter (Signed)
Patient was not available to speak at the time, advised wife of lab report as stated below. KW

## 2017-03-21 NOTE — Telephone Encounter (Signed)
-----   Message from Carmon Ginsberg, [place] sent at 03/21/2017  7:34 AM EDT ----- Regarding: labs Mildly elevated sugar otherwise labs ok.Keep up with exercise and low fat food choices

## 2017-03-27 ENCOUNTER — Telehealth: Payer: Self-pay | Admitting: Cardiovascular Disease

## 2017-03-27 NOTE — Telephone Encounter (Signed)
Per Dr. Rockey Situ on 03/13/17: "Numerous medication intolerances Previous vision problems secondary to other medications Makes it feel it is not a medication specific issue but perhaps we need to run his blood pressure high, possible vascular occlusion Would monitor blood pressure for now"  He is calling to report his recent BP readings since stopping losartan and make Dr. Rockey Situ aware.

## 2017-03-27 NOTE — Telephone Encounter (Signed)
Pt c/o BP issue: STAT if pt c/o blurred vision, one-sided weakness or slurred speech  1. What are your last 5 BP readings?  03/27/17 184/65  03/26/17 159/64 03/25/17 183/66 03/24/17 204/72 03/23/17 179/67    2. Are you having any other symptoms (ex. Dizziness, headache, blurred vision, passed out)?  No symptoms   3. What is your BP issue? Just high, since we took him off the BP Medication  Please advise.

## 2017-03-31 NOTE — Telephone Encounter (Signed)
For days with elevated BP, Would take 1/2 dose losartan If BP continues to run high, take other 1/2 dose losartan IF BP low, hold losartan (SBP <140 to 150)

## 2017-04-01 MED ORDER — LOSARTAN POTASSIUM 100 MG PO TABS: 50.0000 mg | ORAL_TABLET | ORAL | 3 refills | Status: DC | PRN

## 2017-04-01 NOTE — Telephone Encounter (Signed)
Spoke w/ pt's wife. Advised her of Dr. Donivan Scull recommendation.  She is agreeable, though hesitant, as this has caused pt to have vision issues. She is agreeable to having him take 1/2 pill prn, but will call back if he has any issues.

## 2017-04-01 NOTE — Telephone Encounter (Signed)
Left message for pt to call back  °

## 2017-04-03 ENCOUNTER — Other Ambulatory Visit: Payer: Self-pay

## 2017-04-03 ENCOUNTER — Other Ambulatory Visit: Payer: Self-pay | Admitting: Cardiovascular Disease

## 2017-04-03 MED ORDER — LOSARTAN POTASSIUM 100 MG PO TABS: 50.0000 mg | ORAL_TABLET | Freq: Every day | ORAL | 1 refills | Status: DC | PRN

## 2017-04-03 MED ORDER — LOSARTAN POTASSIUM 100 MG PO TABS: 50.0000 mg | ORAL_TABLET | Freq: Every day | ORAL | 3 refills | Status: DC | PRN

## 2017-04-04 ENCOUNTER — Other Ambulatory Visit: Payer: Self-pay

## 2017-04-04 ENCOUNTER — Telehealth: Payer: Self-pay | Admitting: Cardiovascular Disease

## 2017-04-04 MED ORDER — ATORVASTATIN CALCIUM 40 MG PO TABS: 40.0000 mg | ORAL_TABLET | Freq: Every day | ORAL | 3 refills | Status: DC

## 2017-04-04 NOTE — Telephone Encounter (Signed)
°*  STAT* If patient is at the pharmacy, call can be transferred to refill team.   1. Which medications need to be refilled? (please list name of each medication and dose if known) atorvastatin 40 mg   2. Which pharmacy/location (including street and city if local pharmacy) is medication to be sent to? [place] mail order   3. Do they need a 30 day or 90 day supply? 90 day

## 2017-04-04 NOTE — Telephone Encounter (Signed)
Requested Prescriptions   Signed Prescriptions Disp Refills  . atorvastatin (LIPITOR) 40 MG tablet 90 tablet 3    Sig: Take 1 tablet (40 mg total) by mouth daily.    Authorizing Provider: Minna Merritts    Ordering User: Janan Ridge

## 2017-04-10 DIAGNOSIS — L905 Scar conditions and fibrosis of skin: Secondary | ICD-10-CM | POA: Diagnosis not present

## 2017-04-10 DIAGNOSIS — C44329 Squamous cell carcinoma of skin of other parts of face: Secondary | ICD-10-CM | POA: Diagnosis not present

## 2017-04-10 DIAGNOSIS — D485 Neoplasm of uncertain behavior of skin: Secondary | ICD-10-CM | POA: Diagnosis not present

## 2017-04-10 DIAGNOSIS — C44219 Basal cell carcinoma of skin of left ear and external auricular canal: Secondary | ICD-10-CM | POA: Diagnosis not present

## 2017-04-17 ENCOUNTER — Encounter: Payer: Self-pay | Admitting: Family Medicine

## 2017-04-17 DIAGNOSIS — C4491 Basal cell carcinoma of skin, unspecified: Secondary | ICD-10-CM | POA: Diagnosis not present

## 2017-04-17 DIAGNOSIS — D0439 Carcinoma in situ of skin of other parts of face: Secondary | ICD-10-CM | POA: Diagnosis not present

## 2017-04-28 ENCOUNTER — Other Ambulatory Visit: Payer: Self-pay | Admitting: Family Medicine

## 2017-04-28 DIAGNOSIS — E039 Hypothyroidism, unspecified: Secondary | ICD-10-CM

## 2017-05-05 ENCOUNTER — Telehealth: Payer: Self-pay | Admitting: Cardiovascular Disease

## 2017-05-05 NOTE — Telephone Encounter (Signed)
Pt wife states pt is on Losartan 100 mg, she states pt is taking 1/4 and still loses his vision in his left eye, and has dizziness, she asks if pt can have 25 mg prescribed and they can cut that in half. Please call and advise.

## 2017-06-11 ENCOUNTER — Ambulatory Visit
Admission: RE | Admit: 2017-06-11 | Discharge: 2017-06-11 | Disposition: A | Discharge status: Home / Self Care | Payer: Medicare HMO | Source: Ambulatory Visit | Attending: Family Medicine | Admitting: Family Medicine

## 2017-06-11 ENCOUNTER — Other Ambulatory Visit: Payer: Self-pay | Admitting: Family Medicine

## 2017-06-11 ENCOUNTER — Ambulatory Visit (INDEPENDENT_AMBULATORY_CARE_PROVIDER_SITE_OTHER): Payer: Medicare HMO | Admitting: Family Medicine

## 2017-06-11 ENCOUNTER — Encounter: Payer: Self-pay | Admitting: Family Medicine

## 2017-06-11 ENCOUNTER — Telehealth: Payer: Self-pay | Admitting: Family Medicine

## 2017-06-11 VITALS — BP 136/64 | HR 62 | Temp 97.9°F | Resp 16 | Wt 185.4 lb

## 2017-06-11 DIAGNOSIS — S8992XA Unspecified injury of left lower leg, initial encounter: Secondary | ICD-10-CM

## 2017-06-11 DIAGNOSIS — S82002A Unspecified fracture of left patella, initial encounter for closed fracture: Secondary | ICD-10-CM | POA: Diagnosis not present

## 2017-06-11 DIAGNOSIS — W19XXXA Unspecified fall, initial encounter: Secondary | ICD-10-CM | POA: Diagnosis not present

## 2017-06-11 DIAGNOSIS — S82001A Unspecified fracture of right patella, initial encounter for closed fracture: Secondary | ICD-10-CM | POA: Diagnosis not present

## 2017-06-11 DIAGNOSIS — G3184 Mild cognitive impairment, so stated: Secondary | ICD-10-CM | POA: Diagnosis not present

## 2017-06-11 DIAGNOSIS — S82092A Other fracture of left patella, initial encounter for closed fracture: Secondary | ICD-10-CM

## 2017-06-11 DIAGNOSIS — M25562 Pain in left knee: Secondary | ICD-10-CM | POA: Diagnosis present

## 2017-06-11 NOTE — Telephone Encounter (Signed)
Encounter opened on error. KW °

## 2017-06-11 NOTE — Progress Notes (Signed)
Subjective:     Patient ID: Clifford Aguilar, male   DOB: 06/16/1941, 76 y.o.   MRN: [id number]  HPI  Chief Complaint  Patient presents with  . Fall    Patient comes in office today with complaints of left knee pain and swelling since 06/05/17. Patient reports that on 6/7 he was stepping out back of his house and tripped over cord on ground and hit concrete. Patient states that he believes that he heard a "pop sound" but states that he has had swelling around the back of his knee. Patient has been taking otc Naproxen and applying ice.   States his left knee hurts when using stairs but not when his walking. Reports swelling felt more over the last two days. Accompanied by his wife-also want a neurology referral to further evaluate his MCI.   Review of Systems     Objective:   Physical Exam  Constitutional: He appears well-developed and well-nourished. No distress.  Musculoskeletal:  Left knee with minimal swelling, Ligaments stable. KF/KE 5/5. Several bruises noted around his knee.  Tender over his patella and proximal calf. No pitting edema.       Assessment:    1. Injury of left knee, initial encounter - DG Knee Complete 4 Views Left; Future  2. Mild cognitive impairment - Ambulatory referral to Neurology    Plan:    Continue Naproxen, ice and elevation. Further f/u pending x-ray results

## 2017-06-11 NOTE — Patient Instructions (Signed)
We will call with the x-ray report. Continue Naproxen, elevation, and ice.

## 2017-06-13 DIAGNOSIS — M25562 Pain in left knee: Secondary | ICD-10-CM | POA: Diagnosis not present

## 2017-06-25 DIAGNOSIS — M25562 Pain in left knee: Secondary | ICD-10-CM | POA: Diagnosis not present

## 2017-06-30 DIAGNOSIS — Z0000 Encounter for general adult medical examination without abnormal findings: Secondary | ICD-10-CM | POA: Diagnosis not present

## 2017-06-30 DIAGNOSIS — Z6827 Body mass index (BMI) 27.0-27.9, adult: Secondary | ICD-10-CM | POA: Diagnosis not present

## 2017-06-30 DIAGNOSIS — K567 Ileus, unspecified: Secondary | ICD-10-CM | POA: Diagnosis not present

## 2017-06-30 DIAGNOSIS — K219 Gastro-esophageal reflux disease without esophagitis: Secondary | ICD-10-CM | POA: Diagnosis not present

## 2017-06-30 DIAGNOSIS — I10 Essential (primary) hypertension: Secondary | ICD-10-CM | POA: Diagnosis not present

## 2017-06-30 DIAGNOSIS — S82092D Other fracture of left patella, subsequent encounter for closed fracture with routine healing: Secondary | ICD-10-CM | POA: Diagnosis not present

## 2017-06-30 DIAGNOSIS — E789 Disorder of lipoprotein metabolism, unspecified: Secondary | ICD-10-CM | POA: Diagnosis not present

## 2017-06-30 DIAGNOSIS — Z7982 Long term (current) use of aspirin: Secondary | ICD-10-CM | POA: Diagnosis not present

## 2017-06-30 DIAGNOSIS — G479 Sleep disorder, unspecified: Secondary | ICD-10-CM | POA: Diagnosis not present

## 2017-06-30 DIAGNOSIS — E039 Hypothyroidism, unspecified: Secondary | ICD-10-CM | POA: Diagnosis not present

## 2017-07-07 ENCOUNTER — Ambulatory Visit: Payer: Medicare HMO | Admitting: Podiatry

## 2017-07-08 ENCOUNTER — Encounter: Payer: Self-pay | Admitting: Podiatry

## 2017-07-08 ENCOUNTER — Ambulatory Visit (INDEPENDENT_AMBULATORY_CARE_PROVIDER_SITE_OTHER): Payer: Medicare HMO | Admitting: Podiatry

## 2017-07-08 DIAGNOSIS — L84 Corns and callosities: Secondary | ICD-10-CM | POA: Diagnosis not present

## 2017-07-22 ENCOUNTER — Other Ambulatory Visit: Payer: Self-pay | Admitting: Cardiovascular Disease

## 2017-07-23 DIAGNOSIS — M25562 Pain in left knee: Secondary | ICD-10-CM | POA: Diagnosis not present

## 2017-07-26 NOTE — Progress Notes (Signed)
Subjective: Patient presents to the office today for chief complaint of painful callus lesions of the feet. Patient states that the pain is ongoing and is affecting their ability to ambulate without pain. Patient presents today for further treatment and evaluation.  Objective:  Physical Exam General: Alert and oriented x3 in no acute distress  Dermatology: Hyperkeratotic lesion present on the plantar aspect of the fifth MPJ right foot with a central core. Pain on palpation with a central nucleated core noted.  Skin is warm, dry and supple bilateral lower extremities. Negative for open lesions or macerations.  Vascular: Palpable pedal pulses bilaterally. No edema or erythema noted. Capillary refill within normal limits.  Neurological: Epicritic and protective threshold grossly intact bilaterally.   Musculoskeletal Exam: Pain on palpation at the keratotic lesion noted. Range of motion within normal limits bilateral. Muscle strength 5/5 in all groups bilateral.  Assessment: #1 porokeratosis fifth MPJ right foot #2 pain in right foot   Plan of Care:  #1 Patient evaluated #2 Excisional debridement of  keratoic lesion using a chisel blade was performed without incident.  #3 Treated area(s) with Salinocaine and dressed with light dressing. #4 Patient is to return to the clinic PRN.   Edrick Kins, [place]

## 2017-08-13 ENCOUNTER — Telehealth: Payer: Self-pay | Admitting: Family Medicine

## 2017-08-13 DIAGNOSIS — R4189 Other symptoms and signs involving cognitive functions and awareness: Secondary | ICD-10-CM | POA: Diagnosis not present

## 2017-08-13 DIAGNOSIS — G4733 Obstructive sleep apnea (adult) (pediatric): Secondary | ICD-10-CM | POA: Diagnosis not present

## 2017-08-13 DIAGNOSIS — E538 Deficiency of other specified B group vitamins: Secondary | ICD-10-CM | POA: Diagnosis not present

## 2017-08-13 DIAGNOSIS — R69 Illness, unspecified: Secondary | ICD-10-CM | POA: Diagnosis not present

## 2017-08-13 DIAGNOSIS — E559 Vitamin D deficiency, unspecified: Secondary | ICD-10-CM | POA: Diagnosis not present

## 2017-08-13 DIAGNOSIS — R29898 Other symptoms and signs involving the musculoskeletal system: Secondary | ICD-10-CM | POA: Diagnosis not present

## 2017-08-13 DIAGNOSIS — M2170 Unequal limb length (acquired), unspecified site: Secondary | ICD-10-CM | POA: Diagnosis not present

## 2017-08-13 NOTE — Telephone Encounter (Signed)
Pt wife called states Dr Manuella Ghazi is requesting pt stop taking the Rx nortriptyline (PAMELOR) 25 MG capsule. Dr Manuella Ghazi has advised that Mikki Santee would need to discuss with pt how to stop taking this.  CB#[id number]/MW

## 2017-08-14 NOTE — Telephone Encounter (Signed)
Discussed tapering down one capsule weekly as on 4 at bedtime presently. Currently undergoing workup for decreased memory.

## 2017-08-20 ENCOUNTER — Other Ambulatory Visit (HOSPITAL_COMMUNITY): Payer: Self-pay | Admitting: Neurology

## 2017-08-20 DIAGNOSIS — R413 Other amnesia: Secondary | ICD-10-CM

## 2017-08-22 DIAGNOSIS — L570 Actinic keratosis: Secondary | ICD-10-CM | POA: Diagnosis not present

## 2017-08-22 DIAGNOSIS — C4401 Basal cell carcinoma of skin of lip: Secondary | ICD-10-CM | POA: Diagnosis not present

## 2017-08-22 DIAGNOSIS — Z08 Encounter for follow-up examination after completed treatment for malignant neoplasm: Secondary | ICD-10-CM | POA: Diagnosis not present

## 2017-08-22 DIAGNOSIS — Z85828 Personal history of other malignant neoplasm of skin: Secondary | ICD-10-CM | POA: Diagnosis not present

## 2017-08-22 DIAGNOSIS — X32XXXA Exposure to sunlight, initial encounter: Secondary | ICD-10-CM | POA: Diagnosis not present

## 2017-08-22 DIAGNOSIS — D485 Neoplasm of uncertain behavior of skin: Secondary | ICD-10-CM | POA: Diagnosis not present

## 2017-08-25 ENCOUNTER — Other Ambulatory Visit: Payer: Self-pay | Admitting: Family Medicine

## 2017-08-25 ENCOUNTER — Telehealth: Payer: Self-pay | Admitting: Family Medicine

## 2017-08-25 NOTE — Telephone Encounter (Signed)
Pt is returning call.  CB#[phone]/MW

## 2017-08-25 NOTE — Telephone Encounter (Signed)
Spoke with pharmacist who states that patient still has refills on Rx# [id number]. Contacted wife back and instructed her to contact pharmacy. KW

## 2017-08-25 NOTE — Telephone Encounter (Signed)
Should have 3 refills left on 03/2017 order.

## 2017-08-25 NOTE — Telephone Encounter (Signed)
Pt's wife Almyra Free called stating that pt needs a refill on levothyroxine (SYNTHROID, LEVOTHROID) 25 MCG tablet.She states the prescription has expired She would like this faxed to [facility] mail order at [phone]

## 2017-08-25 NOTE — Telephone Encounter (Signed)
Please review. KW 

## 2017-08-25 NOTE — Telephone Encounter (Signed)
[id number] 

## 2017-08-28 ENCOUNTER — Ambulatory Visit (HOSPITAL_COMMUNITY)
Admission: RE | Admit: 2017-08-28 | Discharge: 2017-08-28 | Disposition: A | Discharge status: Home / Self Care | Payer: Medicare HMO | Source: Ambulatory Visit | Attending: Neurology | Admitting: Neurology

## 2017-08-28 DIAGNOSIS — I6522 Occlusion and stenosis of left carotid artery: Secondary | ICD-10-CM | POA: Diagnosis not present

## 2017-08-28 DIAGNOSIS — G9389 Other specified disorders of brain: Secondary | ICD-10-CM | POA: Insufficient documentation

## 2017-08-28 DIAGNOSIS — R4189 Other symptoms and signs involving cognitive functions and awareness: Secondary | ICD-10-CM | POA: Diagnosis not present

## 2017-08-28 DIAGNOSIS — R413 Other amnesia: Secondary | ICD-10-CM | POA: Diagnosis not present

## 2017-09-30 NOTE — Progress Notes (Signed)
Cardiology Office Note  Date:  10/01/2017   ID:  Clifford Aguilar, Clifford Aguilar 03-15-1941, MRN [id number]  PCP:  Carmon Ginsberg, [place]   Chief Complaint  Patient presents with  . other    6 month follow up. Meds reviewed by the pt. verbally. Pt. c/o blood pressure fluctuating.     HPI:  76 years old with  CAD,  peripheral vascular disease,  bypass surgery in 1992,  carotid endarterectomy on the right with residual 60-70% disease on last ultrasound in 2013 , chronically occluded left carotid artery,  multiple surgical procedures on his lower extremity by Dr. Donnetta Hutching,  left iliac stent and left renal angioplasty with Dr. 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% who presents for routine followup of his CAD, s/p CABG  In follow-up today he reports that he is feeling well Continues to have mildly labile pressures, breaking losartan down to 25 mg as needed Range at home sometimes dropping low 606 systolic Previously blood pressure runs low he has vision problems, blurry No regular exercise program  Reviewed previous vascular studies with him in detail Reviewed lab work with him, LDL 70 Recent mild weight loss over the past year, changed his diet Denies any chest pain concerning for angina  EKG on today's visit shows normal sinus rhythm with rate 51 bpm, no significant ST or T-wave changes  Other past medical history reviewed Previously had issues with benazepril, GI problems, diarrhea Symptoms improved by stopping benazepril  Previous carotid ultrasound Carotid 10/16 40 to 59% on the right Followed in [place]  in January 2015, he developed symptoms of "head heaviness ", with discomfort in the back of his neck He took a NTG, and subsequently his BP dropped, he had syncope, family called 911, he was taken by emt to [facility] It was felt his hypotension  related to treatment with Cardura in addition to taking sublingual nitroglycerin in the standing position. He ruled out for myocardial infarction. He underwent a nuclear stress test which showed no evidence of ischemia. Echocardiogram was also unremarkable. He was discharged home on hydralazine in addition to his other blood pressure medications. Since then he reports stopping hydralazine secondary to rash.  In fact he has numerous medications that have given him a rash   3/15 he was seen by Dr. Donnetta Hutching Found to have renal artery stenosis that had progressed,  3/18 he had catheterization for renal artery stent placement  Previous history of blurry vision episodes.   He has not had a cardiac catheterization or stress test in 10 years. He believes he had a catheterization 10 years ago, 3 catheterizations since his bypass surgery 20 years ago.  He quit smoking after his bypass surgery in 1992.  Allergies: imdur/isoorbide dinitrate, lisinsopril, clonidine, hydralazine, coreg,  Benazepril   PMH:   has a past medical history of Cancer ([place]); Carotid artery occlusion; Chronic kidney disease; Coronary artery disease; GERD (gastroesophageal reflux disease); Hyperlipidemia; Hypertension; Myocardial infarction [facility]); PVD (peripheral vascular disease) ([place]); and Shortness of breath.  PSH:    Past Surgical History:  Procedure Laterality Date  . abdomial angiogram  03-16-2014  . ABDOMINAL ANGIOGRAM N/A 03/16/2014   Procedure: ABDOMINAL ANGIOGRAM;  Surgeon: Rosetta Posner, MD;  Location: [facility] CATH LAB;  Service: Cardiovascular;  Laterality: N/A;  . ABDOMINAL AORTAGRAM N/A 12/03/2011   Procedure: ABDOMINAL AORTAGRAM;  Surgeon: Serafina Mitchell, MD;  Location: [facility] CATH  LAB;  Service: Cardiovascular;  Laterality: N/A;  . CAROTID ENDARTERECTOMY     Right. With 46% stenosis in the right by recent Doppler  . COLONOSCOPY WITH PROPOFOL N/A 03/12/2016   Procedure: COLONOSCOPY WITH PROPOFOL;  Surgeon: [name], MD;  Location: [facility] ENDOSCOPY;  Service: Endoscopy;  Laterality: N/A;  . CORONARY ARTERY BYPASS GRAFT  1992  . left common iliac  12/03/2011  . Peripheral vascular revascularization     . RENAL ANGIOGRAM  03-16-2014  . RENAL ANGIOGRAM Left 12/03/2011   Procedure: RENAL ANGIOGRAM;  Surgeon: Serafina Mitchell, MD;  Location: [facility] CATH LAB;  Service: Cardiovascular;  Laterality: Left;  lt renal artery stent  . RENAL ANGIOGRAM N/A 03/16/2014   Procedure: RENAL ANGIOGRAM;  Surgeon: Rosetta Posner, MD;  Location: [facility] CATH LAB;  Service: Cardiovascular;  Laterality: N/A;  . renal artery stenosis  12/03/2011  . SHOULDER ARTHROSCOPY     Right Amnioplasty; Acromioclavicular joint resection and arthroscopic debridement  . Surgery for Sleep Apnea      Current Outpatient Prescriptions  Medication Sig Dispense Refill  . aspirin 81 MG tablet Take 81 mg by mouth daily.     Marland Kitchen atorvastatin (LIPITOR) 40 MG tablet TAKE 1 TABLET DAILY 90 tablet 3  . cholecalciferol (VITAMIN D) 1000 units tablet Take 1,000 Units by mouth daily.    . clopidogrel (PLAVIX) 75 MG tablet Take 1 tablet (75 mg total) by mouth daily. 90 tablet 3  . Coenzyme Q10 (CO Q-10) 100 MG CAPS Take 100 mg by mouth daily.    Marland Kitchen doxazosin (CARDURA) 8 MG tablet Take 1 tablet (8 mg total) by mouth 2 (two) times daily. 180 tablet 3  . ezetimibe (ZETIA) 10 MG tablet Take 1 tablet (10 mg total) by mouth daily. 90 tablet 3  . GARLIC PO Take 2,703 mg by mouth. Takes 7 tablets daily.    . hydrochlorothiazide (HYDRODIURIL) 25 MG tablet Take 1 tablet (25 mg total) by mouth daily. 90 tablet 3  . lansoprazole (PREVACID) 15 MG capsule Take 1 capsule (15 mg total) by mouth daily at 12 noon. (Patient taking differently: Take 30 mg by mouth daily at 12 noon. ) 30 capsule 6  . levothyroxine (SYNTHROID, LEVOTHROID) 25 MCG tablet TAKE 1 TABLET DAILY BEFORE BREAKFAST 90 tablet 3  . losartan (COZAAR) 100 MG tablet Take 0.5 tablets (50 mg total) by mouth daily as needed  (for elevated BP). (Patient taking differently: Take 50 mg by mouth daily as needed (for elevated BP). As directed.) 90 tablet 1  . nitroGLYCERIN (NITROLINGUAL) 0.4 MG/SPRAY spray Place 1 spray under the tongue as needed for chest pain. 12 g 3  . nortriptyline (PAMELOR) 25 MG capsule TAKE 4 CAPSULES AT BEDTIME 360 capsule 3  . polyethylene glycol (MIRALAX / GLYCOLAX) packet Take 17 g by mouth daily. 90 packet 3  . Zn-Pyg Afri-Nettle-Saw Palmet (SAW PALMETTO COMPLEX PO) Take 1,725 mg by mouth daily.      No current facility-administered medications for this visit.      Allergies:   Hydralazine; Adhesive [tape]; Benazepril; Carvedilol; Clonidine derivatives; Lisinopril; Losartan; Hydrazine yellow [tartrazine]; Isosorbide dinitrate; and Isosorbide mononitrate   Social History:  The patient  reports that he quit smoking about 26 years ago. His smoking use included Cigarettes. He quit smokeless tobacco use about 26 years ago. He reports that he drinks about 0.6 - 1.2 oz of alcohol per week . He reports that he does not use drugs.   Family History:  family history includes Heart disease in his other; Stroke in his brother, mother, and sister.    Review of Systems: Review of Systems  Constitutional: Positive for malaise/fatigue.  Respiratory: Negative.   Cardiovascular: Negative.   Gastrointestinal: Negative.   Musculoskeletal: Negative.   Neurological: Negative.   Psychiatric/Behavioral: Negative.   All other systems reviewed and are negative.    PHYSICAL EXAM: VS:  BP (!) 150/60 (BP Location: Left Arm, Patient Position: Sitting, Cuff Size: Normal)   Pulse (!) 51   Ht 5\' 9"  (1.753 m)   Wt 184 lb 12 oz (83.8 kg)   BMI 27.28 kg/m  , BMI Body mass index is 27.28 kg/m. GEN: Well nourished, well developed, in no acute distress  HEENT: normal  Neck: no JVD, + 2 carotid bruit on the right, no masses Cardiac: RRR; no murmurs, rubs, or gallops,no edema  Respiratory:  clear to auscultation  bilaterally, normal work of breathing GI: soft, nontender, nondistended, + BS MS: no deformity or atrophy  Skin: warm and dry, no rash Neuro:  Strength and sensation are intact Psych: euthymic mood, full affect    Recent Labs: 03/20/2017: ALT 15; BUN 17; Creatinine, Ser 1.00; Potassium 4.4; Sodium 139; TSH 6.220    Lipid Panel Lab Results  Component Value Date   CHOL 141 03/20/2017   HDL 34 (L) 03/20/2017   LDLCALC 70 03/20/2017   TRIG 183 (H) 03/20/2017      Wt Readings from Last 3 Encounters:  10/01/17 184 lb 12 oz (83.8 kg)  06/11/17 185 lb 6.4 oz (84.1 kg)  03/13/17 191 lb (86.6 kg)       ASSESSMENT AND PLAN:  Atherosclerosis of native coronary artery of native heart without angina pectoris - Plan: EKG 12-Lead Currently with no symptoms of angina. No further workup at this time. Continue current medication regimen.Stable  Atherosclerosis of autologous vein coronary artery bypass graft with other forms of angina pectoris ([place]) - Plan: EKG 12-Lead  Bilateral carotid artery stenosis - Plan: EKG 12-Lead Followed in [place], stable disease Moderate disease on the right, bruit on exam  PAD (peripheral artery disease) ([place]) - Plan: EKG 12-Lead Reports stable disease, no claudication symptoms Following Blue Recommend we avoid excessively low blood pressure given mesenteric disease, vision problems with low blood pressure  Essential hypertension Suggested he continue to take losartan 25 mg as needed for high pressures Stay on his other medications  Mixed hyperlipidemia Recommended he increase Lipitor up to 80 mg daily with Zetia goal LDL 60 He has had recent weight loss   Total encounter time more than 25 minutes  Greater than 50% was spent in counseling and coordination of care with the patient   Disposition:   F/U  12 months   No orders of the defined types were placed in this encounter.    Signed, Esmond Plants, M.D., Ph.D. 10/01/2017  [place], [place]

## 2017-10-01 ENCOUNTER — Ambulatory Visit (INDEPENDENT_AMBULATORY_CARE_PROVIDER_SITE_OTHER): Payer: Medicare HMO | Admitting: Cardiovascular Disease

## 2017-10-01 ENCOUNTER — Encounter: Payer: Self-pay | Admitting: Cardiovascular Disease

## 2017-10-01 VITALS — BP 150/60 | HR 51 | Ht 69.0 in | Wt 184.8 lb

## 2017-10-01 DIAGNOSIS — I10 Essential (primary) hypertension: Secondary | ICD-10-CM | POA: Diagnosis not present

## 2017-10-01 DIAGNOSIS — I25118 Atherosclerotic heart disease of native coronary artery with other forms of angina pectoris: Secondary | ICD-10-CM | POA: Diagnosis not present

## 2017-10-01 DIAGNOSIS — I701 Atherosclerosis of renal artery: Secondary | ICD-10-CM

## 2017-10-01 DIAGNOSIS — I739 Peripheral vascular disease, unspecified: Secondary | ICD-10-CM

## 2017-10-01 DIAGNOSIS — Z23 Encounter for immunization: Secondary | ICD-10-CM

## 2017-10-01 DIAGNOSIS — I6523 Occlusion and stenosis of bilateral carotid arteries: Secondary | ICD-10-CM | POA: Diagnosis not present

## 2017-10-01 DIAGNOSIS — E782 Mixed hyperlipidemia: Secondary | ICD-10-CM

## 2017-10-01 MED ORDER — NITROGLYCERIN 0.4 MG/SPRAY TL SOLN: 1.0000 | 3 refills | Status: DC | PRN

## 2017-10-01 MED ORDER — ATORVASTATIN CALCIUM 80 MG PO TABS: 80.0000 mg | ORAL_TABLET | Freq: Every day | ORAL | 4 refills | Status: DC

## 2017-10-01 MED ORDER — EZETIMIBE 10 MG PO TABS: 10.0000 mg | ORAL_TABLET | Freq: Every day | ORAL | 3 refills | Status: DC

## 2017-10-01 MED ORDER — LOSARTAN POTASSIUM 25 MG PO TABS: 25.0000 mg | ORAL_TABLET | Freq: Every day | ORAL | 4 refills | Status: DC | PRN

## 2017-10-01 NOTE — Patient Instructions (Addendum)
Medication Instructions:   Please decrease the losartan down to 25 mg Take as needed, for blood pressure >170  Please increase the atorvastatin up to 80 mg daily  Labwork:  No new labs needed  Testing/Procedures:  No further testing at this time   Follow-Up: It was a pleasure seeing you in the office today. Please call us if you have new issues that need to be addressed before your next appt.  [phone]  Your physician wants you to follow-up in: 12 months.  You will receive a reminder letter in the mail two months in advance. If you don't receive a letter, please call our office to schedule the follow-up appointment.  If you need a refill on your cardiac medications before your next appointment, please call your pharmacy.

## 2017-10-08 ENCOUNTER — Other Ambulatory Visit: Payer: Self-pay | Admitting: *Deleted

## 2017-10-08 MED ORDER — NITROGLYCERIN 0.4 MG/SPRAY TL SOLN: 1.0000 | 3 refills | Status: DC | PRN

## 2017-10-10 ENCOUNTER — Telehealth: Payer: Self-pay | Admitting: Cardiovascular Disease

## 2017-10-10 NOTE — Telephone Encounter (Signed)
error 

## 2017-10-14 ENCOUNTER — Other Ambulatory Visit: Payer: Self-pay | Admitting: *Deleted

## 2017-10-14 MED ORDER — NITROGLYCERIN 0.4 MG/SPRAY TL SOLN: 1.0000 | 3 refills | Status: DC | PRN

## 2017-10-15 ENCOUNTER — Telehealth: Payer: Self-pay | Admitting: Cardiovascular Disease

## 2017-10-15 NOTE — Telephone Encounter (Signed)
Request from [facility] received for updated prescription with detailed instructions. Prescription faxed back to number provided. Confirmation on fax received and placed in "Faxed" bin on Merriam Brandner's desk.

## 2017-11-03 DIAGNOSIS — C4401 Basal cell carcinoma of skin of lip: Secondary | ICD-10-CM | POA: Diagnosis not present

## 2017-11-03 DIAGNOSIS — L988 Other specified disorders of the skin and subcutaneous tissue: Secondary | ICD-10-CM | POA: Diagnosis not present

## 2017-11-03 DIAGNOSIS — Z85828 Personal history of other malignant neoplasm of skin: Secondary | ICD-10-CM | POA: Diagnosis not present

## 2017-11-03 DIAGNOSIS — L578 Other skin changes due to chronic exposure to nonionizing radiation: Secondary | ICD-10-CM | POA: Diagnosis not present

## 2017-11-04 ENCOUNTER — Ambulatory Visit (INDEPENDENT_AMBULATORY_CARE_PROVIDER_SITE_OTHER)
Admission: RE | Admit: 2017-11-04 | Discharge: 2017-11-04 | Disposition: A | Discharge status: Home / Self Care | Payer: Medicare HMO | Source: Ambulatory Visit | Attending: Family | Admitting: Family

## 2017-11-04 ENCOUNTER — Ambulatory Visit (HOSPITAL_COMMUNITY)
Admission: RE | Admit: 2017-11-04 | Discharge: 2017-11-04 | Disposition: A | Discharge status: Home / Self Care | Payer: Medicare HMO | Source: Ambulatory Visit | Attending: Family | Admitting: Family

## 2017-11-04 ENCOUNTER — Ambulatory Visit (INDEPENDENT_AMBULATORY_CARE_PROVIDER_SITE_OTHER): Payer: Medicare HMO | Admitting: Family

## 2017-11-04 ENCOUNTER — Encounter: Payer: Self-pay | Admitting: Family

## 2017-11-04 VITALS — BP 182/59 | HR 58 | Temp 97.0°F | Resp 20 | Ht 69.0 in | Wt 185.0 lb

## 2017-11-04 DIAGNOSIS — I701 Atherosclerosis of renal artery: Secondary | ICD-10-CM | POA: Diagnosis not present

## 2017-11-04 DIAGNOSIS — I779 Disorder of arteries and arterioles, unspecified: Secondary | ICD-10-CM

## 2017-11-04 DIAGNOSIS — Z48812 Encounter for surgical aftercare following surgery on the circulatory system: Secondary | ICD-10-CM | POA: Diagnosis not present

## 2017-11-04 DIAGNOSIS — I6522 Occlusion and stenosis of left carotid artery: Secondary | ICD-10-CM | POA: Diagnosis not present

## 2017-11-04 DIAGNOSIS — I70202 Unspecified atherosclerosis of native arteries of extremities, left leg: Secondary | ICD-10-CM | POA: Insufficient documentation

## 2017-11-04 DIAGNOSIS — I7789 Other specified disorders of arteries and arterioles: Secondary | ICD-10-CM | POA: Diagnosis not present

## 2017-11-04 DIAGNOSIS — Z87891 Personal history of nicotine dependence: Secondary | ICD-10-CM

## 2017-11-04 DIAGNOSIS — Z9889 Other specified postprocedural states: Secondary | ICD-10-CM

## 2017-11-04 DIAGNOSIS — I6521 Occlusion and stenosis of right carotid artery: Secondary | ICD-10-CM

## 2017-11-04 DIAGNOSIS — I6523 Occlusion and stenosis of bilateral carotid arteries: Secondary | ICD-10-CM | POA: Diagnosis present

## 2017-11-04 DIAGNOSIS — Z95828 Presence of other vascular implants and grafts: Secondary | ICD-10-CM

## 2017-11-04 DIAGNOSIS — I70291 Other atherosclerosis of native arteries of extremities, right leg: Secondary | ICD-10-CM | POA: Diagnosis not present

## 2017-11-04 LAB — VAS US CAROTID
LCCADDIAS: -9 cm/s
LEFT ECA DIAS: 71 cm/s
LEFT VERTEBRAL DIAS: -18 cm/s
Left CCA dist sys: -47 cm/s
Left CCA prox dias: 6 cm/s
Left CCA prox sys: 64 cm/s
RCCADSYS: -106 cm/s
RCCAPSYS: 143 cm/s
RIGHT CCA MID DIAS: 13 cm/s
Right CCA prox dias: 15 cm/s

## 2017-11-04 NOTE — Progress Notes (Signed)
[facility] [place] HISTORY AND PHYSICAL   MRN : [id number]  History of Present Illness:   Clifford Aguilar is a 76 y.o. male patient of Dr. Donnetta Hutching who returns today for followup of diffuse peripheral vascular occlusive disease. He is status post prior right carotid endarterectomy in 1993; he has a known occlusion of his left internal carotid artery. He is status post bilateral renal artery stenting. Most recently his right renal artery stenitng was in March 2015. He is status post left iliac artery stenting as well.  He does have claudication in both calves, left worse than right, but is able to walk and do his routine activities despite this. He denies any neurologic deficits. He has been stable from a cardiac standpoint.  He reports that he has some hypertension, blood pressure is rarely greater than 675 systolic. Pt denies any history of stroke or TIA, denies dizziness, denies steal symptoms in either hand/arm.  He walks on his treadmill 15 minutes and uses stationary bike 15 minutes, 4 days/week. He walks 45 minutes 4+ days/week in his yard with several breaks until claudication starts.  He denies non healing wounds.   Pt Diabetic: No Pt smoker: former smoker, quit in 1992  Pt meds include: Statin :Yes Betablocker: No ASA: Yes Other anticoagulants/antiplatelets: Plavix    Current Outpatient Medications  Medication Sig Dispense Refill  . atorvastatin (LIPITOR) 80 MG tablet Take 1 tablet (80 mg total) by mouth daily. 90 tablet 4  . cholecalciferol (VITAMIN D) 1000 units tablet Take 1,000 Units by mouth daily.    . clopidogrel (PLAVIX) 75 MG tablet Take 1 tablet (75 mg total) by mouth daily. 90 tablet 3  . Coenzyme Q10 (CO Q-10) 100 MG CAPS Take 100 mg by mouth daily.    Marland Kitchen doxazosin (CARDURA) 8 MG tablet Take 1 tablet (8 mg total) by mouth 2 (two) times daily. 180 tablet 3  . ezetimibe (ZETIA) 10 MG tablet Take 1 tablet (10 mg total) by mouth daily. 90  tablet 3  . GARLIC PO Take 9,163 mg by mouth. Takes 7 tablets daily.    . hydrochlorothiazide (HYDRODIURIL) 25 MG tablet Take 1 tablet (25 mg total) by mouth daily. 90 tablet 3  . lansoprazole (PREVACID) 15 MG capsule Take 1 capsule (15 mg total) by mouth daily at 12 noon. (Patient taking differently: Take 30 mg by mouth daily at 12 noon. ) 30 capsule 6  . levothyroxine (SYNTHROID, LEVOTHROID) 25 MCG tablet TAKE 1 TABLET DAILY BEFORE BREAKFAST 90 tablet 3  . losartan (COZAAR) 25 MG tablet Take 1 tablet (25 mg total) by mouth daily as needed (for elevated BP). 90 tablet 4  . nitroGLYCERIN (NITROLINGUAL) 0.4 MG/SPRAY spray Place 1 spray under the tongue every 5 (five) minutes x 3 doses as needed for chest pain. 12 g 3  . nortriptyline (PAMELOR) 25 MG capsule Take by mouth.    . polyethylene glycol (MIRALAX / GLYCOLAX) packet Take 17 g by mouth daily. 90 packet 3  . Zn-Pyg Afri-Nettle-Saw Palmet (SAW PALMETTO COMPLEX PO) Take 1,725 mg by mouth daily.     Marland Kitchen aspirin 81 MG tablet Take 81 mg by mouth daily.      No current facility-administered medications for this visit.     Past Medical History:  Diagnosis Date  . Cancer ([place])    basel cell carcinoma left ear  . Carotid artery occlusion    s/p right carotid endarterectomy  . Chronic kidney disease  renal artery stenosis  . Coronary artery disease    s/p coronary bypass graft surgery in 1992, stable  . GERD (gastroesophageal reflux disease)   . Hyperlipidemia   . Hypertension   . Myocardial infarction ([place])   . PVD (peripheral vascular disease) ([place])    s/p multiple revascularization procedures.  . Shortness of breath     Social History Social History   Tobacco Use  . Smoking status: Former Smoker    Types: Cigarettes    Last attempt to quit: 12/30/1990    Years since quitting: 26.8  . Smokeless tobacco: Former Systems developer    Quit date: 01/10/1991  . Tobacco comment: quit smoking in 1992  Substance Use Topics  . Alcohol use: Yes     Alcohol/week: 0.6 - 1.2 oz    Types: 1 - 2 Glasses of wine per week    Comment: 1-2 a week  . Drug use: No    Family History Family History  Problem Relation Age of Onset  . Stroke Mother   . Heart disease Other   . Stroke Sister   . Stroke Brother     Surgical History Past Surgical History:  Procedure Laterality Date  . abdomial angiogram  03-16-2014  . CAROTID ENDARTERECTOMY     Right. With 46% stenosis in the right by recent Doppler  . CORONARY ARTERY BYPASS GRAFT  1992  . left common iliac  12/03/2011  . Peripheral vascular revascularization     . RENAL ANGIOGRAM  03-16-2014  . renal artery stenosis  12/03/2011  . SHOULDER ARTHROSCOPY     Right Amnioplasty; Acromioclavicular joint resection and arthroscopic debridement  . Surgery for Sleep Apnea      Allergies  Allergen Reactions  . Hydralazine Rash  . Adhesive [Tape]     SKIN SENSITIVITY  . Benazepril     Nausea, vomiting and diarrhea  . Carvedilol     Blurred visino  . Clonidine Derivatives Other (See Comments)    Caused blindness in left eye for the days on the drug  . Lisinopril Other (See Comments)    Blurred vision   . Hydrazine Yellow [Tartrazine] Itching and Rash  . Isosorbide Dinitrate Rash  . Isosorbide Mononitrate Rash    Current Outpatient Medications  Medication Sig Dispense Refill  . atorvastatin (LIPITOR) 80 MG tablet Take 1 tablet (80 mg total) by mouth daily. 90 tablet 4  . cholecalciferol (VITAMIN D) 1000 units tablet Take 1,000 Units by mouth daily.    . clopidogrel (PLAVIX) 75 MG tablet Take 1 tablet (75 mg total) by mouth daily. 90 tablet 3  . Coenzyme Q10 (CO Q-10) 100 MG CAPS Take 100 mg by mouth daily.    Marland Kitchen doxazosin (CARDURA) 8 MG tablet Take 1 tablet (8 mg total) by mouth 2 (two) times daily. 180 tablet 3  . ezetimibe (ZETIA) 10 MG tablet Take 1 tablet (10 mg total) by mouth daily. 90 tablet 3  . GARLIC PO Take 9,892 mg by mouth. Takes 7 tablets daily.    . hydrochlorothiazide  (HYDRODIURIL) 25 MG tablet Take 1 tablet (25 mg total) by mouth daily. 90 tablet 3  . lansoprazole (PREVACID) 15 MG capsule Take 1 capsule (15 mg total) by mouth daily at 12 noon. (Patient taking differently: Take 30 mg by mouth daily at 12 noon. ) 30 capsule 6  . levothyroxine (SYNTHROID, LEVOTHROID) 25 MCG tablet TAKE 1 TABLET DAILY BEFORE BREAKFAST 90 tablet 3  . losartan (COZAAR) 25 MG tablet Take 1  tablet (25 mg total) by mouth daily as needed (for elevated BP). 90 tablet 4  . nitroGLYCERIN (NITROLINGUAL) 0.4 MG/SPRAY spray Place 1 spray under the tongue every 5 (five) minutes x 3 doses as needed for chest pain. 12 g 3  . nortriptyline (PAMELOR) 25 MG capsule Take by mouth.    . polyethylene glycol (MIRALAX / GLYCOLAX) packet Take 17 g by mouth daily. 90 packet 3  . Zn-Pyg Afri-Nettle-Saw Palmet (SAW PALMETTO COMPLEX PO) Take 1,725 mg by mouth daily.     Marland Kitchen aspirin 81 MG tablet Take 81 mg by mouth daily.      No current facility-administered medications for this visit.      REVIEW OF SYSTEMS: See HPI for pertinent positives and negatives.  Physical Examination Vitals:   11/04/17 1028 11/04/17 1034  BP: 140/65 (!) 182/59  Pulse: (!) 58   Resp: 20   Temp: (!) 97 F (36.1 C)   TempSrc: Oral   SpO2: 98%   Weight: 185 lb (83.9 kg)   Height: 5\' 9"  (1.753 m)    Body mass index is 27.32 kg/m.  General:  WDWN male in NAD Gait: Normal HENT: WNL Eyes: Pupils equal Pulmonary: normal non-labored breathing, good air movement in all fields, no rales, rhonchi, or wheezing Cardiac: RRR, no murmur detected  Abdomen: soft, NT, no masses palpated Skin: no rashes, no ulcers; no cellulitis.   VASCULAR EXAM  Carotid Bruits Right Left   Positive Positive   Abdominal aortic pulse is not palpable Radial pulses are 2+ palpable bilaterally  VASCULAR EXAM: Extremitieswithoutischemic changes, withoutGangrene; withoutopen wounds.  LE Pulses Right Left   FEMORAL 2+ palpable 1+ palpable  POPLITEAL not palpable not palpable  POSTERIOR TIBIAL not palpable not palpable  DORSALIS PEDIS ANTERIOR TIBIAL not palpable not palpable    Musculoskeletal: no muscle wasting or atrophy; no peripheral edema Neurologic:A&O X 3; appropriate affect, sensation is normal, MOTOR FUNCTION: 5/5 Symmetric, CN 2-12 intact, speech is fluent/normal.    ASSESSMENT:  Clifford Aguilar is a 76 y.o. male who is status post prior right carotid endarterectomy in 1993; he has a known occlusion of his left internal carotid artery. He is status post bilateral renal artery stenting. Most recently his right renal artery stenitng was in March 2015. He is status post left iliac artery stenting as well. He has moderate claudication in both calves, no signs of ischemia in his feet/legs. He has no history of stroke or TIA. His blood pressure remains normal taking three antihypertensive medcations.   Fortunately he does not have DM and he quit smoking in 1992.    I advised pt to follow up with his PCP re 3.62 cm x 4.47 cm left renal cyst.    DATA  Carotid Duplex (11/04/17): 40-59% right ICA stenosis. Known left ICA occlusion. Bilateral ECA stenosis.  Right vertebral artery flow is not detected, left is antegrade.  Right subclavian artery waveforms are biphasic, left are triphasic.  No significant change compared to previous exams on 10/20/15 and 10/22/16.  Renal Artery Duplex (11/04/17): Technically difficult exam.  3.62 cm x 4.47 cm left renal cyst (not mentioned on previous duplex). Unable to visualize the renal artery stents.  No focal stenosis visualized in the right renal artery, limited visualization. 60-99% stenosis visualized in the left renal artery, limited visualization. 740 cm/s at the left renal artery origin/proximal.  Unable to detect increased velocity in the right renal artery compared to the exam on  10-22-16.   Bilateral Aortoiliac  Artery Duplex (11/04/17): Technically difficult exam due to body habitus and breathing artifact.  Significant stenosis (50-99%) visualized in the right distal EIA (559 cm/s) (diffuse calcific disease observed). Significant stenosis (50-99%) visualized in the left distal EIA (425 cm/s), unable to visualize stent.   Increased velocity in the right distal EIA (not imaged previously).   ABI (Date: 11/04/2017):  R:   ABI: 0.53 (was 0.47 on 10-22-16),   PT: mono  DP: mono  TBI:  0.32   L:   ABI: 0.44 (was 0.41),   PT: mono  DP: mono  TBI: 0.25 Stable bilateral ABI with moderate disease on the right, severe disease on the left, all monophasic waveforms.   PLAN:   Based on today's exam and non-invasive vascular lab results, and after discussing with Dr. Donnetta Hutching, the patient will follow up in 6 months with the following tests: bilateral renal artery duplex, bilateral aortoiliac duplex;  1 year for carotid duplex and ABI's.  I advised pt to notify us if he develops concerns re worsening circulation in his feet or legs.   I discussed in depth with the patient the nature of atherosclerosis, and emphasized the importance of maximal medical management including strict control of blood pressure, blood glucose, and lipid levels, obtaining regular exercise, and cessation of smoking.  The patient is aware that without maximal medical management the underlying atherosclerotic disease process will progress, limiting the benefit of any interventions.  The patient was given information about stroke prevention and what symptoms should prompt the patient to seek immediate medical care.  The patient was given information about PAD including signs, symptoms, treatment, what symptoms should prompt the patient to seek immediate medical care, and risk reduction measures to take.  Thank you for allowing [place] to participate in this patient's care.  Clemon Chambers, RN, MSN,  FNP-C [facility] Office: [phone]  Clinic MD: Early 11/04/2017 10:48 AM

## 2017-11-04 NOTE — Patient Instructions (Addendum)
Before your next abdominal ultrasound:  Take two Extra-Strength Gas-X capsules at bedtime the night before the test. Take another two Extra-Strength Gas-X capsules 3 hours before the test.  Avoid gas forming foods the day before the test.        Stroke Prevention Some health problems and behaviors may make it more likely for you to have a stroke. Below are ways to lessen your risk of having a stroke.  Be active for at least 30 minutes on most or all days.  Do not smoke. Try not to be around others who smoke.  Do not drink too much alcohol. ? Do not have more than 2 drinks a day if you are a man. ? Do not have more than 1 drink a day if you are a woman and are not pregnant.  Eat healthy foods, such as fruits and vegetables. If you were put on a specific diet, follow the diet as told.  Keep your cholesterol levels under control through diet and medicines. Look for foods that are low in saturated fat, trans fat, cholesterol, and are high in fiber.  If you have diabetes, follow all diet plans and take your medicine as told.  Ask your doctor if you need treatment to lower your blood pressure. If you have high blood pressure (hypertension), follow all diet plans and take your medicine as told by your doctor.  If you are 39-6 years old, have your blood pressure checked every 3-5 years. If you are age 50 or older, have your blood pressure checked every year.  Keep a healthy weight. Eat foods that are low in calories, salt, saturated fat, trans fat, and cholesterol.  Do not take drugs.  Avoid birth control pills, if this applies. Talk to your doctor about the risks of taking birth control pills.  Talk to your doctor if you have sleep problems (sleep apnea).  Take all medicine as told by your doctor. ? You may be told to take aspirin or blood thinner medicine. Take this medicine as told by your doctor. ? Understand your medicine instructions.  Make sure any other conditions you have  are being taken care of.  Get help right away if:  You suddenly lose feeling (you feel numb) or have weakness in your face, arm, or leg.  Your face or eyelid hangs down to one side.  You suddenly feel confused.  You have trouble talking (aphasia) or understanding what people are saying.  You suddenly have trouble seeing in one or both eyes.  You suddenly have trouble walking.  You are dizzy.  You lose your balance or your movements are clumsy (uncoordinated).  You suddenly have a very bad headache and you do not know the cause.  You have new chest pain.  Your heart feels like it is fluttering or skipping a beat (irregular heartbeat). Do not wait to see if the symptoms above go away. Get help right away. Call your local emergency services (911 in [place] Do not drive yourself to the hospital. This information is not intended to replace advice given to you by your health care provider. Make sure you discuss any questions you have with your health care provider. Document Released: 06/16/2012 Document Revised: 05/23/2016 Document Reviewed: 06/18/2013 Elsevier Interactive Patient Education  2018 [place].     Peripheral Vascular Disease Peripheral vascular disease (PVD) is a disease of the blood vessels that are not part of your heart and brain. A simple term for PVD is poor circulation.  In most cases, PVD narrows the blood vessels that carry blood from your heart to the rest of your body. This can result in a decreased supply of blood to your arms, legs, and internal organs, like your stomach or kidneys. However, it most often affects a person's lower legs and feet. There are two types of PVD.  Organic PVD. This is the more common type. It is caused by damage to the structure of blood vessels.  Functional PVD. This is caused by conditions that make blood vessels contract and tighten (spasm).  Without treatment, PVD tends to get worse over time. PVD can also lead to acute  ischemic limb. This is when an arm or limb suddenly has trouble getting enough blood. This is a medical emergency. What are the causes? Each type of PVD has many different causes. The most common cause of PVD is buildup of a fatty material (plaque) inside of your arteries (atherosclerosis). Small amounts of plaque can break off from the walls of the blood vessels and become lodged in a smaller artery. This blocks blood flow and can cause acute ischemic limb. Other common causes of PVD include:  Blood clots that form inside of blood vessels.  Injuries to blood vessels.  Diseases that cause inflammation of blood vessels or cause blood vessel spasms.  Health behaviors and health history that increase your risk of developing PVD.  What increases the risk? You may have a greater risk of PVD if you:  Have a family history of PVD.  Have certain medical conditions, including: ? High cholesterol. ? Diabetes. ? High blood pressure (hypertension). ? Coronary heart disease. ? Past problems with blood clots. ? Past injury, such as burns or a broken bone. These may have damaged blood vessels in your limbs. ? Buerger disease. This is caused by inflamed blood vessels in your hands and feet. ? Some forms of arthritis. ? Rare birth defects that affect the arteries in your legs.  Use tobacco.  Do not get enough exercise.  Are obese.  Are age 25 or older.  What are the signs or symptoms? PVD may cause many different symptoms. Your symptoms depend on what part of your body is not getting enough blood. Some common signs and symptoms include:  Cramps in your lower legs. This may be a symptom of poor leg circulation (claudication).  Pain and weakness in your legs while you are physically active that goes away when you rest (intermittent claudication).  Leg pain when at rest.  Leg numbness, tingling, or weakness.  Coldness in a leg or foot, especially when compared with the other leg.  Skin  or hair changes. These can include: ? Hair loss. ? Shiny skin. ? Pale or bluish skin. ? Thick toenails.  Inability to get or maintain an erection (erectile dysfunction).  People with PVD are more prone to developing ulcers and sores on their toes, feet, or legs. These may take longer than normal to heal. How is this diagnosed? Your health care provider may diagnose PVD from your signs and symptoms. The health care provider will also do a physical exam. You may have tests to find out what is causing your PVD and determine its severity. Tests may include:  Blood pressure recordings from your arms and legs and measurements of the strength of your pulses (pulse volume recordings).  Imaging studies using sound waves to take pictures of the blood flow through your blood vessels (Doppler ultrasound).  Injecting a dye into your blood vessels  before having imaging studies using: ? X-rays (angiogram or arteriogram). ? Computer-generated X-rays (CT angiogram). ? A powerful electromagnetic field and a computer (magnetic resonance angiogram or MRA).  How is this treated? Treatment for PVD depends on the cause of your condition and the severity of your symptoms. It also depends on your age. Underlying causes need to be treated and controlled. These include long-lasting (chronic) conditions, such as diabetes, high cholesterol, and high blood pressure. You may need to first try making lifestyle changes and taking medicines. Surgery may be needed if these do not work. Lifestyle changes may include:  Quitting smoking.  Exercising regularly.  Following a low-fat, low-cholesterol diet.  Medicines may include:  Blood thinners to prevent blood clots.  Medicines to improve blood flow.  Medicines to improve your blood cholesterol levels.  Surgical procedures may include:  A procedure that uses an inflated balloon to open a blocked artery and improve blood flow (angioplasty).  A procedure to put in  a tube (stent) to keep a blocked artery open (stent implant).  Surgery to reroute blood flow around a blocked artery (peripheral bypass surgery).  Surgery to remove dead tissue from an infected wound on the affected limb.  Amputation. This is surgical removal of the affected limb. This may be necessary in cases of acute ischemic limb that are not improved through medical or surgical treatments.  Follow these instructions at home:  Take medicines only as directed by your health care provider.  Do not use any tobacco products, including cigarettes, chewing tobacco, or electronic cigarettes. If you need help quitting, ask your health care provider.  Lose weight if you are overweight, and maintain a healthy weight as directed by your health care provider.  Eat a diet that is low in fat and cholesterol. If you need help, ask your health care provider.  Exercise regularly. Ask your health care provider to suggest some good activities for you.  Use compression stockings or other mechanical devices as directed by your health care provider.  Take good care of your feet. ? Wear comfortable shoes that fit well. ? Check your feet often for any cuts or sores. Contact a health care provider if:  You have cramps in your legs while walking.  You have leg pain when you are at rest.  You have coldness in a leg or foot.  Your skin changes.  You have erectile dysfunction.  You have cuts or sores on your feet that are not healing. Get help right away if:  Your arm or leg turns cold and blue.  Your arms or legs become red, warm, swollen, painful, or numb.  You have chest pain or trouble breathing.  You suddenly have weakness in your face, arm, or leg.  You become very confused or lose the ability to speak.  You suddenly have a very bad headache or lose your vision. This information is not intended to replace advice given to you by your health care provider. Make sure you discuss any  questions you have with your health care provider. Document Released: 01/23/2005 Document Revised: 05/23/2016 Document Reviewed: 05/26/2014 Elsevier Interactive Patient Education  2017 [employer].

## 2017-11-10 DIAGNOSIS — E559 Vitamin D deficiency, unspecified: Secondary | ICD-10-CM | POA: Diagnosis not present

## 2017-11-13 DIAGNOSIS — G4733 Obstructive sleep apnea (adult) (pediatric): Secondary | ICD-10-CM | POA: Diagnosis not present

## 2017-11-13 DIAGNOSIS — E559 Vitamin D deficiency, unspecified: Secondary | ICD-10-CM | POA: Diagnosis not present

## 2017-11-13 DIAGNOSIS — R69 Illness, unspecified: Secondary | ICD-10-CM | POA: Diagnosis not present

## 2017-11-13 DIAGNOSIS — R4189 Other symptoms and signs involving cognitive functions and awareness: Secondary | ICD-10-CM | POA: Diagnosis not present

## 2017-11-24 ENCOUNTER — Telehealth: Payer: Self-pay | Admitting: Cardiovascular Disease

## 2017-11-24 NOTE — Telephone Encounter (Signed)
Pharmacy calling stating they received atorvastatin  They need to know the dose on medication.  And to please send it in   Please advise  Ref: [id number]

## 2017-11-24 NOTE — Telephone Encounter (Signed)
Spoke with Lattie Haw from [facility] regarding dose of Atorvastatin dose.  Make her aware per last offive visit 10/01/2017 Dr Rockey Situ increased Atorvastatin to 80MG .

## 2017-12-17 ENCOUNTER — Other Ambulatory Visit: Payer: Self-pay | Admitting: Cardiovascular Disease

## 2017-12-17 ENCOUNTER — Other Ambulatory Visit: Payer: Self-pay | Admitting: Vascular Surgery

## 2017-12-17 DIAGNOSIS — I739 Peripheral vascular disease, unspecified: Secondary | ICD-10-CM

## 2018-02-16 DIAGNOSIS — F5101 Primary insomnia: Secondary | ICD-10-CM | POA: Diagnosis not present

## 2018-02-16 DIAGNOSIS — G4733 Obstructive sleep apnea (adult) (pediatric): Secondary | ICD-10-CM | POA: Diagnosis not present

## 2018-02-16 DIAGNOSIS — F0280 Dementia in other diseases classified elsewhere without behavioral disturbance: Secondary | ICD-10-CM | POA: Diagnosis not present

## 2018-02-16 DIAGNOSIS — F0150 Vascular dementia without behavioral disturbance: Secondary | ICD-10-CM | POA: Diagnosis not present

## 2018-02-16 DIAGNOSIS — G309 Alzheimer's disease, unspecified: Secondary | ICD-10-CM | POA: Diagnosis not present

## 2018-02-18 ENCOUNTER — Ambulatory Visit
Admission: RE | Admit: 2018-02-18 | Discharge: 2018-02-18 | Disposition: A | Discharge status: Home / Self Care | Payer: PPO | Source: Ambulatory Visit | Attending: Physician Assistant | Admitting: Physician Assistant

## 2018-02-18 ENCOUNTER — Encounter: Payer: Self-pay | Admitting: Physician Assistant

## 2018-02-18 ENCOUNTER — Ambulatory Visit (INDEPENDENT_AMBULATORY_CARE_PROVIDER_SITE_OTHER): Payer: PPO | Admitting: Physician Assistant

## 2018-02-18 VITALS — BP 142/78 | HR 60 | Temp 98.0°F | Resp 16 | Wt 190.0 lb

## 2018-02-18 DIAGNOSIS — I10 Essential (primary) hypertension: Secondary | ICD-10-CM

## 2018-02-18 DIAGNOSIS — R937 Abnormal findings on diagnostic imaging of other parts of musculoskeletal system: Secondary | ICD-10-CM | POA: Insufficient documentation

## 2018-02-18 DIAGNOSIS — J90 Pleural effusion, not elsewhere classified: Secondary | ICD-10-CM | POA: Diagnosis not present

## 2018-02-18 DIAGNOSIS — S299XXA Unspecified injury of thorax, initial encounter: Secondary | ICD-10-CM | POA: Diagnosis not present

## 2018-02-18 DIAGNOSIS — I25718 Atherosclerosis of autologous vein coronary artery bypass graft(s) with other forms of angina pectoris: Secondary | ICD-10-CM | POA: Diagnosis not present

## 2018-02-18 DIAGNOSIS — R0781 Pleurodynia: Secondary | ICD-10-CM | POA: Diagnosis not present

## 2018-02-18 MED ORDER — OXYCODONE-ACETAMINOPHEN 5-325 MG PO TABS: 0.5000 | ORAL_TABLET | Freq: Three times a day (TID) | ORAL | 0 refills | Status: DC | PRN

## 2018-02-18 NOTE — Progress Notes (Signed)
Patient: Clifford Aguilar Male    DOB: 10-17-1941   77 y.o.   MRN: [id number] Visit Date: 02/18/2018  Today's Provider: Trinna Post, PA-C   Chief Complaint  Patient presents with  . Fall    02/16/2018; left sided rib pain   Subjective:    Clifford Aguilar is a 77 y/o man with history of CABG on Plavix, HTN, and hypothyroidism presenting today for fall on 02/16/2018. He states he tripped over a rug in the bathroom and fell onto the entryway of his shower. He fell onto his left side. Since then, has has a bruise and extreme pain when breathing. He did not hit his head or lose consciousness. He has not had shortness of breath since that point. He has not had abdominal pain. He has not had vomiting. He endorses good appetite. He is able to ambulate.   Fall  The accident occurred 2 days ago. The fall occurred while walking. Point of impact: Left ribs. Pain location: Left ribs. The symptoms are aggravated by movement. Pertinent negatives include no abdominal pain, bowel incontinence, fever, headaches, hearing loss, hematuria, loss of consciousness, nausea, numbness, tingling, visual change or vomiting. He has tried acetaminophen and NSAID for the symptoms. The treatment provided no relief.       Allergies  Allergen Reactions  . Hydralazine Rash  . Adhesive [Tape]     SKIN SENSITIVITY  . Benazepril     Nausea, vomiting and diarrhea  . Carvedilol     Blurred visino  . Clonidine Derivatives Other (See Comments)    Caused blindness in left eye for the days on the drug  . Lisinopril Other (See Comments)    Blurred vision   . Hydrazine Yellow [Tartrazine] Itching and Rash  . Isosorbide Dinitrate Rash  . Isosorbide Mononitrate Rash     Current Outpatient Medications:  .  aspirin 81 MG tablet, Take 81 mg by mouth daily. , Disp: , Rfl:  .  atorvastatin (LIPITOR) 80 MG tablet, Take 1 tablet (80 mg total) by mouth daily., Disp: 90 tablet, Rfl: 4 .  cholecalciferol (VITAMIN D) 1000  units tablet, Take 1,000 Units by mouth daily., Disp: , Rfl:  .  clopidogrel (PLAVIX) 75 MG tablet, Take 1 tablet (75 mg total) by mouth daily., Disp: 90 tablet, Rfl: 3 .  clopidogrel (PLAVIX) 75 MG tablet, TAKE 1 TABLET DAILY, Disp: 90 tablet, Rfl: 3 .  Coenzyme Q10 (CO Q-10) 100 MG CAPS, Take 100 mg by mouth daily., Disp: , Rfl:  .  doxazosin (CARDURA) 8 MG tablet, TAKE 1 TABLET TWICE A DAY, Disp: 180 tablet, Rfl: 3 .  ezetimibe (ZETIA) 10 MG tablet, Take 1 tablet (10 mg total) by mouth daily., Disp: 90 tablet, Rfl: 3 .  GARLIC PO, Take 3,329 mg by mouth. Takes 7 tablets daily., Disp: , Rfl:  .  hydrochlorothiazide (HYDRODIURIL) 25 MG tablet, Take 1 tablet (25 mg total) by mouth daily., Disp: 90 tablet, Rfl: 3 .  lansoprazole (PREVACID) 15 MG capsule, Take 1 capsule (15 mg total) by mouth daily at 12 noon. (Patient taking differently: Take 30 mg by mouth daily at 12 noon. ), Disp: 30 capsule, Rfl: 6 .  levothyroxine (SYNTHROID, LEVOTHROID) 25 MCG tablet, TAKE 1 TABLET DAILY BEFORE BREAKFAST, Disp: 90 tablet, Rfl: 3 .  losartan (COZAAR) 25 MG tablet, Take 1 tablet (25 mg total) by mouth daily as needed (for elevated BP)., Disp: 90 tablet, Rfl: 4 .  nitroGLYCERIN (  NITROLINGUAL) 0.4 MG/SPRAY spray, Place 1 spray under the tongue every 5 (five) minutes x 3 doses as needed for chest pain., Disp: 12 g, Rfl: 3 .  nortriptyline (PAMELOR) 25 MG capsule, Take by mouth., Disp: , Rfl:  .  polyethylene glycol (MIRALAX / GLYCOLAX) packet, Take 17 g by mouth daily., Disp: 90 packet, Rfl: 3 .  Zn-Pyg Afri-Nettle-Saw Palmet (SAW PALMETTO COMPLEX PO), Take 1,725 mg by mouth daily. , Disp: , Rfl:   Review of Systems  Constitutional: Negative.  Negative for fever.  Respiratory: Negative for apnea, cough, choking, chest tightness, shortness of breath, wheezing and stridor.   Gastrointestinal: Negative for abdominal pain, bowel incontinence, nausea and vomiting.  Genitourinary: Negative for hematuria.    Neurological: Negative for tingling, loss of consciousness, numbness and headaches.    Social History   Tobacco Use  . Smoking status: Former Smoker    Types: Cigarettes    Last attempt to quit: 12/30/1990    Years since quitting: 27.1  . Smokeless tobacco: Former Systems developer    Quit date: 01/10/1991  . Tobacco comment: quit smoking in 1992  Substance Use Topics  . Alcohol use: Yes    Alcohol/week: 0.6 - 1.2 oz    Types: 1 - 2 Glasses of wine per week    Comment: 1-2 a week   Objective:   BP (!) 142/78 (BP Location: Left Arm, Patient Position: Sitting, Cuff Size: Large)   Pulse 60   Temp 98 F (36.7 C) (Oral)   Resp 16   Wt 190 lb (86.2 kg)   BMI 28.06 kg/m  Vitals:   02/18/18 1122  BP: (!) 142/78  Pulse: 60  Resp: 16  Temp: 98 F (36.7 C)  TempSrc: Oral  Weight: 190 lb (86.2 kg)     Physical Exam  Constitutional: He is oriented to person, place, and time. He appears well-developed and well-nourished.  Cardiovascular: Normal rate and regular rhythm.  Pulmonary/Chest: Effort normal and breath sounds normal. No respiratory distress. He exhibits tenderness.    Area of injury is nontender to palpation, no palpable step offs.   Neurological: He is alert and oriented to person, place, and time.  Skin: Skin is warm and dry.  Psychiatric: He has a normal mood and affect. His behavior is normal.        Assessment & Plan:     1. Rib pain on left side  Patient oxygenating well today in office with good air entry on exam. No other signs of traumatic injury and it has been >24 hours since injury. He is on Plavix and Aspirin 2/2 CABG so is not a good candidate for NSAIDs. Will give low dose percocet with sedation precautions to control for pain. CXR shows small left sided pleural effusion but no pneumothorax. I have personally reviewed the images and agree with the findings. Patient is not SOB in clinic. Strict return precautions advised.    - DG Chest 2 View; Future  -  oxyCODONE-acetaminophen (PERCOCET/ROXICET) 5-325 MG tablet; Take 0.5 tablets by mouth every 8 (eight) hours as needed for severe pain.  Dispense: 10 tablet; Refill: 0  2. 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  3. Essential hypertension  Return if symptoms worsen or fail to improve.  The entirety of the information documented in the History of Present Illness, Review of Systems and Physical Exam were  personally obtained by me. Portions of this information were initially documented by Ashley Royalty, CMA and reviewed by me for thoroughness and accuracy.         Trinna Post, PA-C  [facility]

## 2018-02-18 NOTE — Patient Instructions (Signed)
Chest Wall Pain °Chest wall pain is pain in or around the bones and muscles of your chest. Sometimes, an injury causes this pain. Sometimes, the cause may not be known. This pain may take several weeks or longer to get better. °Follow these instructions at home: °Pay attention to any changes in your symptoms. Take these actions to help with your pain: °· Rest as told by your doctor. °· Avoid activities that cause pain. Try not to use your chest, belly (abdominal), or side muscles to lift heavy things. °· If directed, apply ice to the painful area: °? Put ice in a plastic bag. °? Place a towel between your skin and the bag. °? Leave the ice on for 20 minutes, 2-3 times per day. °· Take over-the-counter and prescription medicines only as told by your doctor. °· Do not use tobacco products, including cigarettes, chewing tobacco, and e-cigarettes. If you need help quitting, ask your doctor. °· Keep all follow-up visits as told by your doctor. This is important. ° °Contact a doctor if: °· You have a fever. °· Your chest pain gets worse. °· You have new symptoms. °Get help right away if: °· You feel sick to your stomach (nauseous) or you throw up (vomit). °· You feel sweaty or light-headed. °· You have a cough with phlegm (sputum) or you cough up blood. °· You are short of breath. °This information is not intended to replace advice given to you by your health care provider. Make sure you discuss any questions you have with your health care provider. °Document Released: 06/03/2008 Document Revised: 05/23/2016 Document Reviewed: 03/13/2015 °Elsevier Interactive Patient Education © 2018 Elsevier Inc. ° °

## 2018-02-19 ENCOUNTER — Encounter: Payer: Self-pay | Admitting: *Deleted

## 2018-02-19 ENCOUNTER — Telehealth: Payer: Self-pay | Admitting: Cardiovascular Disease

## 2018-02-19 ENCOUNTER — Telehealth: Payer: Self-pay

## 2018-02-19 NOTE — Telephone Encounter (Signed)
Pt pharmacy has been updated to [facility].

## 2018-02-19 NOTE — Telephone Encounter (Signed)
Patient was advised he reports that he is taking medication as prescribed for pain but is still having soreness when up and moving around, he states when he lays down the pain is not so bad. KW

## 2018-02-19 NOTE — Telephone Encounter (Signed)
-----   Message from Trinna Post, [place] sent at 02/18/2018  3:16 PM EST ----- Small left pleural effusion, some chronic changes in lungs. Looks like there is a left 8th rib fracture, consistent with patient's injuries. Otherwise, lungs look well inflated. Please take pain medicine as needed and please seek emergent care for fever, shortness of breath, more difficulty breathing, increasing chest pain.

## 2018-02-19 NOTE — Telephone Encounter (Signed)
Pt spouse calling to change the pharmacy  She states they changed insurance company  He does not need a refill yet but would like to go ahead and change in in our system  [facility] mail order   Fax [phone]

## 2018-02-20 NOTE — Telephone Encounter (Signed)
The soreness is to be expected. He can take a whole pill every 8 hours if the half is not relieving pain enough. Please have him call back if he needs a refill on his pain medication, if he is taking a whole pill it will only be a few days worth.

## 2018-02-20 NOTE — Telephone Encounter (Signed)
Patients wife was advised. KW 

## 2018-02-23 ENCOUNTER — Other Ambulatory Visit: Payer: Self-pay | Admitting: Physician Assistant

## 2018-02-23 DIAGNOSIS — R0781 Pleurodynia: Secondary | ICD-10-CM

## 2018-02-23 MED ORDER — OXYCODONE-ACETAMINOPHEN 5-325 MG PO TABS: 1.0000 | ORAL_TABLET | Freq: Three times a day (TID) | ORAL | 0 refills | Status: AC | PRN

## 2018-02-23 NOTE — Telephone Encounter (Signed)
Left message advising pt.   Thanks,   -laura

## 2018-02-23 NOTE — Telephone Encounter (Signed)
Patient is requesting refill on oxyCODONE-acetaminophen (PERCOCET/ROXICET) 5-325 MG tablet

## 2018-02-23 NOTE — Telephone Encounter (Signed)
Last visit, patient instructed to take oxycodone-acetaminophen 5-325, 1/2 - 1 tablet every 8 hours for pain from left sided rib fracture. 1/2 pill did not adequately control his pain so he increased to one pill every 8 hours and is thus requiring refill. Will send this in today. Please continue to watch for fever, increasing SOB, worsening pain, productive cough.

## 2018-02-27 DIAGNOSIS — F0150 Vascular dementia without behavioral disturbance: Secondary | ICD-10-CM | POA: Insufficient documentation

## 2018-02-27 DIAGNOSIS — G309 Alzheimer's disease, unspecified: Secondary | ICD-10-CM

## 2018-02-27 DIAGNOSIS — F0280 Dementia in other diseases classified elsewhere without behavioral disturbance: Secondary | ICD-10-CM

## 2018-03-17 ENCOUNTER — Encounter: Payer: Self-pay | Admitting: Cardiovascular Disease

## 2018-03-24 ENCOUNTER — Other Ambulatory Visit: Payer: Self-pay

## 2018-03-24 DIAGNOSIS — I739 Peripheral vascular disease, unspecified: Secondary | ICD-10-CM

## 2018-03-24 MED ORDER — CLOPIDOGREL BISULFATE 75 MG PO TABS: 75.0000 mg | ORAL_TABLET | Freq: Every day | ORAL | 3 refills | Status: DC

## 2018-03-30 ENCOUNTER — Ambulatory Visit (INDEPENDENT_AMBULATORY_CARE_PROVIDER_SITE_OTHER): Payer: PPO | Admitting: Family Medicine

## 2018-03-30 ENCOUNTER — Encounter: Payer: Self-pay | Admitting: Family Medicine

## 2018-03-30 VITALS — BP 115/74 | HR 66 | Temp 98.0°F | Resp 16 | Wt 192.8 lb

## 2018-03-30 DIAGNOSIS — N281 Cyst of kidney, acquired: Secondary | ICD-10-CM | POA: Insufficient documentation

## 2018-03-30 DIAGNOSIS — L309 Dermatitis, unspecified: Secondary | ICD-10-CM | POA: Diagnosis not present

## 2018-03-30 NOTE — Progress Notes (Signed)
Subjective:     Patient ID: Clifford Aguilar, male   DOB: [birth year], 77 y.o.   MRN: [id number] Chief Complaint  Patient presents with  . Rash    Patient comes into office today with complaints of rash that has been coming and going for the past year. Patient states that rash always appears around his ankles and is red, patient reports that hew has been using otc hydrocortisone.    HPI Reports occasional leg swelling with hx if varices. Also states his lower back itches at site of old back surgery scar. Accompanied by his wife today.  Review of Systems     Objective:   Physical Exam  Constitutional: He appears well-developed and well-nourished. No distress.  Skin:  Distal lower extremities with prominent varices and a couple of discrete areas of excoriation. Patches of hyperpigmentation noted on each leg but no erythema or pitting. Lower back is unremarkable.       Assessment:    1. Dermatitis: suspect venous stasis but will get second opinion from his dermatologist. - Ambulatory referral to Dermatology    Plan:    Continue hydrocortisone cream as needed.

## 2018-03-30 NOTE — Patient Instructions (Signed)
Continue hydrocortisone as needed. We will call you about the dermatology referral.

## 2018-03-31 ENCOUNTER — Other Ambulatory Visit: Payer: Self-pay | Admitting: *Deleted

## 2018-03-31 ENCOUNTER — Other Ambulatory Visit: Payer: Self-pay | Admitting: Family Medicine

## 2018-03-31 ENCOUNTER — Other Ambulatory Visit: Payer: Self-pay

## 2018-03-31 DIAGNOSIS — E039 Hypothyroidism, unspecified: Secondary | ICD-10-CM

## 2018-03-31 DIAGNOSIS — I739 Peripheral vascular disease, unspecified: Secondary | ICD-10-CM

## 2018-03-31 MED ORDER — LEVOTHYROXINE SODIUM 25 MCG PO TABS: 25.0000 ug | ORAL_TABLET | Freq: Every day | ORAL | 3 refills | Status: DC

## 2018-03-31 MED ORDER — CLOPIDOGREL BISULFATE 75 MG PO TABS: 75.0000 mg | ORAL_TABLET | Freq: Every day | ORAL | 3 refills | Status: DC

## 2018-03-31 NOTE — Telephone Encounter (Signed)
Done-pending physical this month.

## 2018-03-31 NOTE — Telephone Encounter (Signed)
Pt needs refill of levothyroxine sent to [facility].

## 2018-03-31 NOTE — Telephone Encounter (Signed)
See below

## 2018-04-01 MED ORDER — DOXAZOSIN MESYLATE 8 MG PO TABS: 8.0000 mg | ORAL_TABLET | Freq: Two times a day (BID) | ORAL | 3 refills | Status: DC

## 2018-04-01 MED ORDER — ATORVASTATIN CALCIUM 80 MG PO TABS: 80.0000 mg | ORAL_TABLET | Freq: Every day | ORAL | 4 refills | Status: DC

## 2018-04-01 MED ORDER — HYDROCHLOROTHIAZIDE 25 MG PO TABS: 25.0000 mg | ORAL_TABLET | Freq: Every day | ORAL | 3 refills | Status: DC

## 2018-04-07 ENCOUNTER — Encounter: Payer: PPO | Admitting: Family Medicine

## 2018-04-08 DIAGNOSIS — G4733 Obstructive sleep apnea (adult) (pediatric): Secondary | ICD-10-CM | POA: Diagnosis not present

## 2018-04-14 ENCOUNTER — Encounter: Payer: Self-pay | Admitting: Family Medicine

## 2018-04-14 ENCOUNTER — Other Ambulatory Visit: Payer: Self-pay

## 2018-04-14 ENCOUNTER — Ambulatory Visit (INDEPENDENT_AMBULATORY_CARE_PROVIDER_SITE_OTHER): Payer: PPO | Admitting: Family Medicine

## 2018-04-14 VITALS — BP 140/70 | HR 71 | Temp 97.8°F | Resp 16 | Ht 69.0 in | Wt 187.8 lb

## 2018-04-14 DIAGNOSIS — I739 Peripheral vascular disease, unspecified: Secondary | ICD-10-CM

## 2018-04-14 DIAGNOSIS — G3184 Mild cognitive impairment, so stated: Secondary | ICD-10-CM | POA: Diagnosis not present

## 2018-04-14 DIAGNOSIS — E039 Hypothyroidism, unspecified: Secondary | ICD-10-CM

## 2018-04-14 DIAGNOSIS — I25718 Atherosclerosis of autologous vein coronary artery bypass graft(s) with other forms of angina pectoris: Secondary | ICD-10-CM

## 2018-04-14 DIAGNOSIS — I701 Atherosclerosis of renal artery: Secondary | ICD-10-CM

## 2018-04-14 DIAGNOSIS — G4733 Obstructive sleep apnea (adult) (pediatric): Secondary | ICD-10-CM | POA: Diagnosis not present

## 2018-04-14 DIAGNOSIS — E782 Mixed hyperlipidemia: Secondary | ICD-10-CM | POA: Diagnosis not present

## 2018-04-14 DIAGNOSIS — Z0000 Encounter for general adult medical examination without abnormal findings: Secondary | ICD-10-CM

## 2018-04-14 DIAGNOSIS — I10 Essential (primary) hypertension: Secondary | ICD-10-CM

## 2018-04-14 DIAGNOSIS — N138 Other obstructive and reflux uropathy: Secondary | ICD-10-CM | POA: Diagnosis not present

## 2018-04-14 DIAGNOSIS — I779 Disorder of arteries and arterioles, unspecified: Secondary | ICD-10-CM

## 2018-04-14 DIAGNOSIS — N401 Enlarged prostate with lower urinary tract symptoms: Secondary | ICD-10-CM

## 2018-04-14 DIAGNOSIS — Z9889 Other specified postprocedural states: Secondary | ICD-10-CM

## 2018-04-14 DIAGNOSIS — Z95828 Presence of other vascular implants and grafts: Secondary | ICD-10-CM

## 2018-04-14 NOTE — Patient Instructions (Addendum)
We will call you with the lab results. Will discuss referral to a urologist at that time. Do try the CPAP at night for your sleep apnea.

## 2018-04-14 NOTE — Progress Notes (Signed)
SASubjective:     Patient ID: Clifford Aguilar, male   DOB: [birth year], 77 y.o.   MRN: [id number] Chief Complaint  Patient presents with  . Annual Exam    Patient comes in office today for his annual physical, in general he states that he feels well and is staying active daily doing yard work. Patient states that he has poor sleep and is suppose to be in a C-pap machine. Patient last breported Td 01/29/16, Zoster 03/20/07, Prevnar 11/21/14, Colonoscopy 03/12/16, PSA 03/20/17.    HPI Followed by Dr. Manuella Ghazi, neurology, for cognitive impairment and for OSA. Other providers include: Dr. Luana Shu; Dr. Donnetta Hutching, vascular; Dr. Allen Norris, G.I., Dr. Amalia Hailey, podiatry,Dr. Kellie Moor, dermatology; Dr. Thomasene Ripple, ophthalmology; Dr. Jacelyn Grip. Have provided him today with rx for Shingrix vaccine. Vascular follow up studies pending in May.  Review of Systems General: Feeling well HEENT: regular dental visits and eye exams Cardiovascular: no chest pain, shortness of breath, or palpitations GI: no heartburn, no change in bowel habits or blood in the stool GU: nocturia x 3-4, no change in bladder habits. GERD controlled by medication. Neuro: feels cognition after a medication change. Followed by neurology. Confirmed to have OSA and will be trying CPAP soon. Psychiatric: not depressed Musculoskeletal: no joint pain. He has tripped 2-3 times in the last year incurring injury. No syncope.    Objective:   Physical Exam  Constitutional: He appears well-developed and well-nourished. No distress.  Eyes: Pupils aphakic, EOMI Neck: no thyromegaly, tenderness or nodules, no cervical adenopathy, bilateral carotid bruits c/w past history ENT: TM's intact without inflammation; No tonsillar enlargement or exudate, Lungs: Clear Heart : RRR without murmur or gallop Abd: bowel sounds present, soft, non-tender, no organomegaly Rectal: Prostate firm and non-tender with minimal enlargement. Extremities: no edema       Assessment:    1. Medicare annual wellness visit, subsequent  2. Essential hypertension - Comprehensive metabolic panel  3. Benign prostatic hyperplasia with urinary obstruction - PSA  4. Mixed hyperlipidemia - Lipid panel  5. Atherosclerosis of autologous vein coronary artery bypass graft with other forms of angina pectoris [facility]): per cardiology  6. Mild cognitive impairment: per neurology  7. PAD (peripheral artery disease) ([place]): per vascular surgery  8. OSA (obstructive sleep apnea):  Pending CPAP trial  9. Hypothyroidism, unspecified typ - T4, free - TSH    Plan:    Further f/u pending lab results. Will discuss referral to urology at that time. Saw Dr. Bernardo Heater in 2014.

## 2018-04-15 DIAGNOSIS — N401 Enlarged prostate with lower urinary tract symptoms: Secondary | ICD-10-CM | POA: Diagnosis not present

## 2018-04-15 DIAGNOSIS — E039 Hypothyroidism, unspecified: Secondary | ICD-10-CM | POA: Diagnosis not present

## 2018-04-15 DIAGNOSIS — N138 Other obstructive and reflux uropathy: Secondary | ICD-10-CM | POA: Diagnosis not present

## 2018-04-15 DIAGNOSIS — I10 Essential (primary) hypertension: Secondary | ICD-10-CM | POA: Diagnosis not present

## 2018-04-15 DIAGNOSIS — E782 Mixed hyperlipidemia: Secondary | ICD-10-CM | POA: Diagnosis not present

## 2018-04-16 LAB — COMPREHENSIVE METABOLIC PANEL
A/G RATIO: 1.7 (ref 1.2–2.2)
ALBUMIN: 4.1 g/dL (ref 3.5–4.8)
ALK PHOS: 100 IU/L (ref 39–117)
ALT: 16 IU/L (ref 0–44)
AST: 19 IU/L (ref 0–40)
BUN / CREAT RATIO: 19 (ref 10–24)
BUN: 16 mg/dL (ref 8–27)
Bilirubin Total: 0.7 mg/dL (ref 0.0–1.2)
CALCIUM: 9.4 mg/dL (ref 8.6–10.2)
CO2: 23 mmol/L (ref 20–29)
CREATININE: 0.86 mg/dL (ref 0.76–1.27)
Chloride: 102 mmol/L (ref 96–106)
GFR calc Af Amer: 99 mL/min/{1.73_m2} (ref >59)
GFR, EST NON AFRICAN AMERICAN: 85 mL/min/{1.73_m2} (ref >59)
GLOBULIN, TOTAL: 2.4 g/dL (ref 1.5–4.5)
Glucose: 106 mg/dL — ABNORMAL HIGH (ref 65–99)
Potassium: 4.8 mmol/L (ref 3.5–5.2)
SODIUM: 140 mmol/L (ref 134–144)
Total Protein: 6.5 g/dL (ref 6.0–8.5)

## 2018-04-16 LAB — LIPID PANEL
CHOLESTEROL TOTAL: 133 mg/dL (ref 100–199)
Chol/HDL Ratio: 3.7 ratio (ref 0.0–5.0)
HDL: 36 mg/dL — ABNORMAL LOW (ref >39)
LDL Calculated: 73 mg/dL (ref 0–99)
TRIGLYCERIDES: 121 mg/dL (ref 0–149)
VLDL CHOLESTEROL CAL: 24 mg/dL (ref 5–40)

## 2018-04-16 LAB — TSH: TSH: 5.29 u[IU]/mL — ABNORMAL HIGH (ref 0.450–4.500)

## 2018-04-16 LAB — T4, FREE: Free T4: 1.07 ng/dL (ref 0.82–1.77)

## 2018-04-16 LAB — PSA: Prostate Specific Ag, Serum: 0.7 ng/mL (ref 0.0–4.0)

## 2018-04-20 ENCOUNTER — Telehealth: Payer: Self-pay | Admitting: Family Medicine

## 2018-04-20 NOTE — Telephone Encounter (Signed)
See if they will accept a verbal Pneumovax 23 order. He is high risk and is 5 years out from his prior shot.

## 2018-04-20 NOTE — Telephone Encounter (Signed)
Please advise 

## 2018-04-20 NOTE — Telephone Encounter (Signed)
Pt requesting a pneumonia RX vaccine called into [facility]

## 2018-04-21 NOTE — Telephone Encounter (Signed)
Spoke with pharmacist on phone and authorized prescription. KW

## 2018-05-07 DIAGNOSIS — X32XXXA Exposure to sunlight, initial encounter: Secondary | ICD-10-CM | POA: Diagnosis not present

## 2018-05-07 DIAGNOSIS — L570 Actinic keratosis: Secondary | ICD-10-CM | POA: Diagnosis not present

## 2018-05-07 DIAGNOSIS — Z08 Encounter for follow-up examination after completed treatment for malignant neoplasm: Secondary | ICD-10-CM | POA: Diagnosis not present

## 2018-05-07 DIAGNOSIS — C44319 Basal cell carcinoma of skin of other parts of face: Secondary | ICD-10-CM | POA: Diagnosis not present

## 2018-05-07 DIAGNOSIS — Z85828 Personal history of other malignant neoplasm of skin: Secondary | ICD-10-CM | POA: Diagnosis not present

## 2018-05-07 DIAGNOSIS — D485 Neoplasm of uncertain behavior of skin: Secondary | ICD-10-CM | POA: Diagnosis not present

## 2018-05-12 ENCOUNTER — Ambulatory Visit (INDEPENDENT_AMBULATORY_CARE_PROVIDER_SITE_OTHER)
Admission: RE | Admit: 2018-05-12 | Discharge: 2018-05-12 | Disposition: A | Discharge status: Home / Self Care | Payer: PPO | Source: Ambulatory Visit | Attending: Family | Admitting: Family

## 2018-05-12 ENCOUNTER — Ambulatory Visit: Payer: PPO | Admitting: Family

## 2018-05-12 ENCOUNTER — Ambulatory Visit (HOSPITAL_COMMUNITY)
Admission: RE | Admit: 2018-05-12 | Discharge: 2018-05-12 | Disposition: A | Discharge status: Home / Self Care | Payer: PPO | Source: Ambulatory Visit | Attending: Family | Admitting: Family

## 2018-05-12 VITALS — BP 176/67 | HR 52 | Temp 97.3°F | Resp 16 | Ht 69.0 in | Wt 184.0 lb

## 2018-05-12 DIAGNOSIS — I6522 Occlusion and stenosis of left carotid artery: Secondary | ICD-10-CM | POA: Diagnosis not present

## 2018-05-12 DIAGNOSIS — Z87891 Personal history of nicotine dependence: Secondary | ICD-10-CM | POA: Insufficient documentation

## 2018-05-12 DIAGNOSIS — I779 Disorder of arteries and arterioles, unspecified: Secondary | ICD-10-CM

## 2018-05-12 DIAGNOSIS — E785 Hyperlipidemia, unspecified: Secondary | ICD-10-CM | POA: Insufficient documentation

## 2018-05-12 DIAGNOSIS — I10 Essential (primary) hypertension: Secondary | ICD-10-CM | POA: Insufficient documentation

## 2018-05-12 DIAGNOSIS — I6521 Occlusion and stenosis of right carotid artery: Secondary | ICD-10-CM

## 2018-05-12 DIAGNOSIS — I701 Atherosclerosis of renal artery: Secondary | ICD-10-CM | POA: Diagnosis not present

## 2018-05-12 DIAGNOSIS — Z95828 Presence of other vascular implants and grafts: Secondary | ICD-10-CM

## 2018-05-12 DIAGNOSIS — Z9889 Other specified postprocedural states: Secondary | ICD-10-CM

## 2018-05-12 NOTE — Progress Notes (Signed)
[facility] [place]   CC: Follow up peripheral artery occlusive disease, renal artery stenosis, extracranial carotid artery stenosis   History of Present Illness Clifford Aguilar is a 77 y.o. male whom Dr. Donnetta Hutching has been monitoring for diffuse peripheral vascular occlusive disease.  He is status post prior right carotid endarterectomy in 1993; he has a known occlusion of his left internal carotid artery. He is status post bilateral renal artery stenting. Most recently his right renal artery stenitng was in March 2015. He is status post left iliac artery stenting as well.  He does have claudication in both calves, left worse than right, but is able to walk and do his routine activities despite this. He denies any neurologic deficits. He has been stable from a cardiac standpoint.  He reports that he has some hypertension,blood pressure is rarely greater than 742 systolic. Pt denies any history of stroke or TIA, denies dizziness, denies steal symptoms in either hand/arm.  [place] his treadmill 15 minutes and uses stationary bike 15 minutes, 4 days/week. He walks 45 minutes 4+ days/week in his yard with several breaks until claudication starts, no change in his claudication sx's.   He denies non healing wounds.  He states his blood pressure at home is about 140/80, states he knows his blood pressure is up because he is in a medical office.    Pt Diabetic: No Pt smoker: former smoker, quit in 1992  Pt meds include: Statin :Yes Betablocker: No ASA: Yes Other anticoagulants/antiplatelets: Plavix    Past Medical History:  Diagnosis Date  . Cancer ([place])    basel cell carcinoma left ear  . Carotid artery occlusion    s/p right carotid endarterectomy  . Chronic kidney disease    renal artery stenosis  . Coronary artery disease    s/p coronary bypass graft surgery in 1992, stable  . GERD (gastroesophageal reflux disease)   . Hyperlipidemia   .  Hypertension   . Myocardial infarction ([place])   . PVD (peripheral vascular disease) ([place])    s/p multiple revascularization procedures.  . Shortness of breath     Social History Social History   Tobacco Use  . Smoking status: Former Smoker    Types: Cigarettes    Last attempt to quit: 12/30/1990    Years since quitting: 27.3  . Smokeless tobacco: Former Systems developer    Quit date: 01/10/1991  . Tobacco comment: quit smoking in 1992  Substance Use Topics  . Alcohol use: Yes    Alcohol/week: 0.6 - 1.2 oz    Types: 1 - 2 Glasses of wine per week    Comment: 1-2 a week  . Drug use: No    Family History Family History  Problem Relation Age of Onset  . Stroke Mother   . Heart disease Other   . Stroke Sister   . Stroke Brother     Past Surgical History:  Procedure Laterality Date  . abdomial angiogram  03-16-2014  . ABDOMINAL ANGIOGRAM N/A 03/16/2014   Procedure: ABDOMINAL ANGIOGRAM;  Surgeon: Rosetta Posner, MD;  Location: [facility] CATH LAB;  Service: Cardiovascular;  Laterality: N/A;  . ABDOMINAL AORTAGRAM N/A 12/03/2011   Procedure: ABDOMINAL AORTAGRAM;  Surgeon: Serafina Mitchell, MD;  Location: [facility] CATH LAB;  Service: Cardiovascular;  Laterality: N/A;  . CAROTID ENDARTERECTOMY     Right. With 46% stenosis in the right by recent Doppler  . COLONOSCOPY WITH PROPOFOL N/A 03/12/2016   Procedure: COLONOSCOPY WITH PROPOFOL;  Surgeon:  [name], MD;  Location: [facility] ENDOSCOPY;  Service: Endoscopy;  Laterality: N/A;  . CORONARY ARTERY BYPASS GRAFT  1992  . left common iliac  12/03/2011  . Peripheral vascular revascularization     . RENAL ANGIOGRAM  03-16-2014  . RENAL ANGIOGRAM Left 12/03/2011   Procedure: RENAL ANGIOGRAM;  Surgeon: Serafina Mitchell, MD;  Location: [facility] CATH LAB;  Service: Cardiovascular;  Laterality: Left;  lt renal artery stent  . RENAL ANGIOGRAM N/A 03/16/2014   Procedure: RENAL ANGIOGRAM;  Surgeon: Rosetta Posner, MD;  Location: [facility] CATH LAB;  Service: Cardiovascular;  Laterality: N/A;   . renal artery stenosis  12/03/2011  . SHOULDER ARTHROSCOPY     Right Amnioplasty; Acromioclavicular joint resection and arthroscopic debridement  . Surgery for Sleep Apnea      Allergies  Allergen Reactions  . Hydralazine Rash  . Adhesive [Tape]     SKIN SENSITIVITY  . Benazepril     Nausea, vomiting and diarrhea  . Carvedilol     Blurred visino  . Clonidine Derivatives Other (See Comments)    Caused blindness in left eye for the days on the drug  . Lisinopril Other (See Comments)    Blurred vision   . Losartan     Blurry vision if doses>25mg . Only use it for his BP is >170  . Hydrazine Yellow [Tartrazine] Itching and Rash  . Isosorbide Dinitrate Rash  . Isosorbide Mononitrate Rash    Current Outpatient Medications  Medication Sig Dispense Refill  . aspirin 81 MG tablet Take 81 mg by mouth daily.     Marland Kitchen atorvastatin (LIPITOR) 80 MG tablet Take 1 tablet (80 mg total) by mouth daily. (Patient taking differently: Take 40 mg by mouth daily. ) 90 tablet 4  . cholecalciferol (VITAMIN D) 1000 units tablet Take 1,000 Units by mouth daily.    . clopidogrel (PLAVIX) 75 MG tablet Take 1 tablet (75 mg total) by mouth daily. 90 tablet 3  . Coenzyme Q10 (CO Q-10) 100 MG CAPS Take 100 mg by mouth daily.    Marland Kitchen doxazosin (CARDURA) 8 MG tablet Take 1 tablet (8 mg total) by mouth 2 (two) times daily. 180 tablet 3  . ezetimibe (ZETIA) 10 MG tablet Take 1 tablet (10 mg total) by mouth daily. 90 tablet 3  . GARLIC PO Take 1,478 mg by mouth. Takes 7 tablets daily.    . hydrochlorothiazide (HYDRODIURIL) 25 MG tablet Take 1 tablet (25 mg total) by mouth daily. 90 tablet 3  . lansoprazole (PREVACID) 15 MG capsule Take 1 capsule (15 mg total) by mouth daily at 12 noon. (Patient taking differently: Take 30 mg by mouth daily at 12 noon. ) 30 capsule 6  . levothyroxine (SYNTHROID, LEVOTHROID) 25 MCG tablet Take 1 tablet (25 mcg total) by mouth daily before breakfast. 90 tablet 3  . losartan (COZAAR) 25 MG  tablet Take 1 tablet (25 mg total) by mouth daily as needed (for elevated BP). 90 tablet 4  . nitroGLYCERIN (NITROLINGUAL) 0.4 MG/SPRAY spray Place 1 spray under the tongue every 5 (five) minutes x 3 doses as needed for chest pain. 12 g 3  . polyethylene glycol (MIRALAX / GLYCOLAX) packet Take 17 g by mouth daily. 90 packet 3  . Zn-Pyg Afri-Nettle-Saw Palmet (SAW PALMETTO COMPLEX PO) Take 1,725 mg by mouth daily.      No current facility-administered medications for this visit.     ROS: See HPI for pertinent positives and negatives.   Physical Examination  Vitals:  05/12/18 1026 05/12/18 1029  BP: (!) 186/70 (!) 176/67  Pulse: (!) 52   Resp: 16   Temp: (!) 97.3 F (36.3 C)   TempSrc: Oral   SpO2: 98%   Weight: 184 lb (83.5 kg)   Height: 5\' 9"  (1.753 m)    Body mass index is 27.17 kg/m.  General: WDWN male in NAD Gait: Normal HENT: no gross abnormalities  Eyes: PERRLA Pulmonary: normal non-labored breathing, good air movement in all fields,no rales, rhonchi, or wheezing Cardiac: RRR, no murmur detected Abdomen: soft, NT, no masses palpated Skin: no rashes, no ulcers; no cellulitis.   VASCULAR EXAM  Carotid Bruits Right Left   Positive Positive   Abdominal aortic pulse is not palpable Radial pulses are 2+ palpable bilaterally  VASCULAR EXAM: Extremitieswithoutischemic changes, withoutGangrene; withoutopen wounds.  LE Pulses Right Left  FEMORAL 2+ palpable 2+ palpable  POPLITEAL not palpable not palpable  POSTERIOR TIBIAL not palpable not palpable  DORSALIS PEDIS ANTERIOR TIBIAL not palpable not palpable   Musculoskeletal: no muscle wasting or atrophy; no peripheral edema Neurologic:A&O X 3; appropriate affect, sensation is normal, MOTOR FUNCTION: 5/5 Symmetric, CN 2-12 intact, speechis fluent/normal.   Psychiatric: Thought content is normal, mood appropriate for clinical situation.     ASSESSMENT: Clifford Aguilar is a 77 y.o. male who is status post prior right carotid endarterectomy in 1993; he has a known occlusion of his left internal carotid artery. He is status post bilateral renal artery stenting. Most recently his right renal artery stenitng was in March 2015. He is status post left iliac artery stenting as well. He has moderate claudication in both calves, nosigns of ischemia in his feet/legs. He has a remote history of several small strokes as identified on brain imaging wife states, but he does not recall any stroke or TIA sx's.  His blood pressure is elevated now, but at home remains in controltaking three antihypertensive medcations.If his blood pressure drops below a certain threshold, vision in his left eye becomes blurry.   Fortunately he does not have DM and he quit smoking in 1992.   I advised pt to follow up with his PCP re 3.4 cm x 4.4 cm left renal cyst, and right renal cyst of1.4 cm x 2.2 cm. I also advised him to follow up with his PCP at his 11-04-17 visit re renal cysts noted.     DATA  Abdominal/Visceral Duplex (05-12-18): Right: normal sized kidney. >60% stenosis (417cm/s at origin, 395  Cm/s proximal. Right renal cyst measures 1.4 cm x 2.2 cm. Left: Normal size of kidney. >60% stenosis (287 cm/s at origin, 235 cm/s at mid, 220 cm/s at distal). Left renal cyst measures 3.4 cm x 4.4 cm No significant change compared to the exam on 11-04-17.    Bilateral Aortoiliac Artery Duplex  (05-12-18): >50% stenosis in the distal EIA bilaterally. Left EIA stent not visualized. Left CIA stent difficult to visualize secondary to acoustic shadowing from calcified plaque; all monophasic waveforms. 346 cm/s at right EIA distal (was 559 cm/s on 11-04-17) 430 cm/s at left EIA distal (was 425 cm/s on 11-04-17)   Carotid Duplex (11/04/17): 40-59% right ICA stenosis. Known left ICA occlusion. Bilateral ECA stenosis.  Right vertebral artery flow is  not detected, left is antegrade.  Right subclavian artery waveforms are biphasic, left are triphasic.  No significant change compared to previous exams on10/21/16 and 10/22/16.    ABI (Date: 11/04/2017):  R:  ? ABI: 0.53 (was 0.47 on 10-22-16),  ?  PT: mono ? DP: mono ? TBI:  0.32   L:  ? ABI: 0.44 (was 0.41),  ? PT: mono ? DP: mono ? TBI: 0.25 Stable bilateral ABI with moderate disease on the right, severe disease on the left, all monophasic waveforms.     PLAN:   Based on today's exam and non-invasive vascular lab results,and after discussing with Dr. Evonnie Dawes patient will follow up in 1 year with the following tests: bilateral renal artery duplex, bilateral aortoiliac duplex; carotid duplex, and ABI's..  I advised pt to notify us if he develops concerns re worsening circulation in his feet or legs.    I discussed in depth with the patient the nature of atherosclerosis, and emphasized the importance of maximal medical management including strict control of blood pressure, blood glucose, and lipid levels, obtaining regular exercise, and continued cessation of smoking.  The patient is aware that without maximal medical management the underlying atherosclerotic disease process will progress, limiting the benefit of any interventions.  The patient was given information about PAD including signs, symptoms, treatment, what symptoms should prompt the patient to seek immediate medical care, and risk reduction measures to take.  Clemon Chambers, RN, MSN, FNP-C [facility] [place] [facility] Phone: [phone]  Clinic MD: Early  05/12/18 10:31 AM

## 2018-05-12 NOTE — Patient Instructions (Addendum)
Before your next abdominal ultrasound:  Take two Extra-Strength Gas-X capsules at bedtime the night before the test. Take another two Extra-Strength Gas-X capsules 3 hours before the test.  Avoid gas forming foods and beverages the day before the test.      Stroke Prevention Some health problems and behaviors may make it more likely for you to have a stroke. Below are ways to lessen your risk of having a stroke.  Be active for at least 30 minutes on most or all days.  Do not smoke. Try not to be around others who smoke.  Do not drink too much alcohol. ? Do not have more than 2 drinks a day if you are a man. ? Do not have more than 1 drink a day if you are a woman and are not pregnant.  Eat healthy foods, such as fruits and vegetables. If you were put on a specific diet, follow the diet as told.  Keep your cholesterol levels under control through diet and medicines. Look for foods that are low in saturated fat, trans fat, cholesterol, and are high in fiber.  If you have diabetes, follow all diet plans and take your medicine as told.  Ask your doctor if you need treatment to lower your blood pressure. If you have high blood pressure (hypertension), follow all diet plans and take your medicine as told by your doctor.  If you are 26-31 years old, have your blood pressure checked every 3-5 years. If you are age 12 or older, have your blood pressure checked every year.  Keep a healthy weight. Eat foods that are low in calories, salt, saturated fat, trans fat, and cholesterol.  Do not take drugs.  Avoid birth control pills, if this applies. Talk to your doctor about the risks of taking birth control pills.  Talk to your doctor if you have sleep problems (sleep apnea).  Take all medicine as told by your doctor. ? You may be told to take aspirin or blood thinner medicine. Take this medicine as told by your doctor. ? Understand your medicine instructions.  Make sure any other  conditions you have are being taken care of.  Get help right away if:  You suddenly lose feeling (you feel numb) or have weakness in your face, arm, or leg.  Your face or eyelid hangs down to one side.  You suddenly feel confused.  You have trouble talking (aphasia) or understanding what people are saying.  You suddenly have trouble seeing in one or both eyes.  You suddenly have trouble walking.  You are dizzy.  You lose your balance or your movements are clumsy (uncoordinated).  You suddenly have a very bad headache and you do not know the cause.  You have new chest pain.  Your heart feels like it is fluttering or skipping a beat (irregular heartbeat). Do not wait to see if the symptoms above go away. Get help right away. Call your local emergency services (911 in [place] Do not drive yourself to the hospital. This information is not intended to replace advice given to you by your health care provider. Make sure you discuss any questions you have with your health care provider. Document Released: 06/16/2012 Document Revised: 05/23/2016 Document Reviewed: 06/18/2013 Elsevier Interactive Patient Education  2018 [place].     Peripheral Vascular Disease Peripheral vascular disease (PVD) is a disease of the blood vessels that are not part of your heart and brain. A simple term for PVD is poor circulation.  In most cases, PVD narrows the blood vessels that carry blood from your heart to the rest of your body. This can result in a decreased supply of blood to your arms, legs, and internal organs, like your stomach or kidneys. However, it most often affects a person's lower legs and feet. There are two types of PVD.  Organic PVD. This is the more common type. It is caused by damage to the structure of blood vessels.  Functional PVD. This is caused by conditions that make blood vessels contract and tighten (spasm).  Without treatment, PVD tends to get worse over time. PVD can  also lead to acute ischemic limb. This is when an arm or limb suddenly has trouble getting enough blood. This is a medical emergency. Follow these instructions at home:  Take medicines only as told by your doctor.  Do not use any tobacco products, including cigarettes, chewing tobacco, or electronic cigarettes. If you need help quitting, ask your doctor.  Lose weight if you are overweight, and maintain a healthy weight as told by your doctor.  Eat a diet that is low in fat and cholesterol. If you need help, ask your doctor.  Exercise regularly. Ask your doctor for some good activities for you.  Take good care of your feet. ? Wear comfortable shoes that fit well. ? Check your feet often for any cuts or sores. Contact a doctor if:  You have cramps in your legs while walking.  You have leg pain when you are at rest.  You have coldness in a leg or foot.  Your skin changes.  You are unable to get or have an erection (erectile dysfunction).  You have cuts or sores on your feet that are not healing. Get help right away if:  Your arm or leg turns cold and blue.  Your arms or legs become red, warm, swollen, painful, or numb.  You have chest pain or trouble breathing.  You suddenly have weakness in your face, arm, or leg.  You become very confused or you cannot speak.  You suddenly have a very bad headache.  You suddenly cannot see. This information is not intended to replace advice given to you by your health care provider. Make sure you discuss any questions you have with your health care provider. Document Released: 03/12/2010 Document Revised: 05/23/2016 Document Reviewed: 05/26/2014 Elsevier Interactive Patient Education  2017 [place].     Renal Artery Stenosis Renal artery stenosis (RAS) is narrowing of the artery that carries blood to your kidneys. It can affect one or both kidneys. Your kidneys filter waste and extra fluid from your blood. You get rid of the  waste and fluid when you urinate. Your kidneys also make an important chemical messenger (hormone) called renin. Renin helps regulate your blood pressure. The first sign of RAS may be high blood pressure. Over time, other symptoms can develop. What are the causes? Plaque buildup in your arteries (atherosclerosis) is the main cause of RAS. The plaques that cause this are made up of:  Fat.  Cholesterol.  Calcium.  Other substances.  As these substances build up in your renal artery, this slows the blood supply to your kidneys. The lack of blood and oxygen causes the signs and symptoms of RAS. A much less common cause of RAS is a disease called fibromuscular dysplasia. This disease causes abnormal cell growth that narrows the renal artery. It is not related to atherosclerosis. It occurs mostly in women who are 25-50 years  old. It may be passed down through families. What increases the risk? You may be at risk for renal artery stenosis if you:  Are a man who is at least 75 years old.  Are a woman who is at least 75 years old.  Have high blood pressure.  Have high cholesterol.  Are a smoker.  Abuse alcohol.  Have diabetes or prediabetes.  Are overweight.  Have a family history of early heart disease.  What are the signs or symptoms? RAS usually develops slowly. You may not have any signs or symptoms at first. The earliest signs may be:  Developing high blood pressure.  A sudden increase in existing high blood pressure.  No longer responding to medicine that used to control your blood pressure.  Later signs and symptoms are due to kidney damage. They may include:  Fatigue.  Shortness of breath.  Swollen legs and feet.  Dry skin.  Headaches.  Muscle cramps.  Loss of appetite.  Nausea or vomiting.  How is this diagnosed? Your health care provider may suspect RAS based on changes in your blood pressure and your risk factors. A physical exam will be done. Your  health care provider may use a stethoscope to listen for a whooshing sound (bruit) that can occur where the renal artery is blocking blood flow. Several tests may be done to confirm a diagnosis of RAS. These may include:  Blood and urine tests to check your kidney function.  Imaging tests of your kidneys, such as: ? A test that involves using sound waves to create an image of your kidneys and the blood flow to your kidneys (ultrasound). ? A test in which dye is injected into one of your blood vessels so images can be taken as the dye flows through your renal arteries (angiogram). These tests can be done using X-rays, a CT scan (computed tomography angiogram, CTA), or a type of MRI (magnetic resonance angiogram, MRA).  How is this treated? Making lifestyle changes to reduce your risk factors is the first treatment option for early RAS. If the blood flow to one of your kidneys is cut by more than half, you may need medicine to:  Lower your blood pressure. This is the main medical treatment for RAS. You may need more than one type of medicine for this. The two types that work best for RAS are: ? ACE inhibitors. ? Angiotensin receptor blockers.  Reduce fluid in the body (diuretics).  Lower your cholesterol (statins).  If medicine is not enough to control RAS, you may need surgery. This may involve:  Threading a tube with an inflatable balloon into the renal artery to force it open (angioplasty).  Removing plaque from inside the artery (endarterectomy).  Follow these instructions at home:  Take medicines only as directed by your health care provider.  Make any lifestyle changes recommended by your health care provider. This may include: ? Working with a dietitian to maintain a heart-healthy diet. This type of diet is low in saturated fat, salt, and added sugar. ? Starting an exercise program as directed by your health care provider. ? Maintaining a healthy weight. ? Quitting  smoking. ? Not abusing alcohol.  Keep all follow-up visits as directed by your health care provider. This is important. Contact a health care provider if:  Your symptoms of RAS are not getting better.  Your symptoms are changing or getting worse. Get help right away if:  You have very bad pain in your back or abdomen.  You have blood in your urine. This information is not intended to replace advice given to you by your health care provider. Make sure you discuss any questions you have with your health care provider. Document Released: 09/11/2005 Document Revised: 05/23/2016 Document Reviewed: 03/31/2014 Elsevier Interactive Patient Education  [employer].

## 2018-05-13 ENCOUNTER — Ambulatory Visit: Payer: PPO | Admitting: Family Medicine

## 2018-05-13 ENCOUNTER — Other Ambulatory Visit: Payer: Self-pay | Admitting: Family Medicine

## 2018-05-13 DIAGNOSIS — N281 Cyst of kidney, acquired: Secondary | ICD-10-CM

## 2018-05-13 NOTE — [place] Notes (Signed)
Cardiology Office Note  Date:  05/14/2018   ID:  Yusuf, Yu 10-19-1941, MRN [id number]  PCP:  Carmon Ginsberg, [place]   Chief Complaint  Patient presents with  . other    Afib. Meds reviewed verbally with pt.    HPI:  77 years old with  CAD,  peripheral vascular disease,  bypass surgery in 1992,  carotid endarterectomy on the right with residual 60-70% disease on last ultrasound in 2013 , chronically occluded left carotid artery,  multiple surgical procedures on his lower extremity by Dr. Donnetta Hutching,  left iliac stent and left renal angioplasty with Dr. 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% who presents for routine followup of his CAD, s/p CABG  Recently seen by vascular in the office and irregular rhythm appreciated He denies any significant symptoms or palpitations or tachycardia Blood pressure elevated on visit in their office May 14 and again today Reports that he gets very nervous Wife present today reports blood pressure at home 650 systolic  When blood pressure runs low in the past has had blurry vision No regular exercise program  Lab work reviewed with him total cholesterol 130 LDL 70 Trying to lose some weight Denies any chest pain concerning for angina  EKG personally reviewed by myself on todays visit Shows normal sinus rhythm rate 63 bpm APCs and PVCs  Other past medical history reviewed Previously had issues with benazepril, GI problems, diarrhea Symptoms improved by stopping benazepril  Previous carotid ultrasound Carotid 10/16 40 to 59% on the right Followed in [place]  in January 2015, he developed symptoms of "head heaviness ", with discomfort in the back of his neck He took a NTG, and subsequently his BP dropped, he had syncope, family called 911, he was taken by emt to [facility] It was felt his hypotension  related to treatment with Cardura in addition to taking sublingual nitroglycerin in the standing position. He ruled out for myocardial infarction. He underwent a nuclear stress test which showed no evidence of ischemia. Echocardiogram was also unremarkable. He was discharged home on hydralazine in addition to his other blood pressure medications. Since then he reports stopping hydralazine secondary to rash.  In fact he has numerous medications that have given him a rash   3/15 he was seen by Dr. Donnetta Hutching Found to have renal artery stenosis that had progressed,  3/18 he had catheterization for renal artery stent placement  Previous history of blurry vision episodes.   He has not had a cardiac catheterization or stress test in 10 years. He believes he had a catheterization 10 years ago, 3 catheterizations since his bypass surgery 20 years ago.  He quit smoking after his bypass surgery in 1992.  Allergies: imdur/isoorbide dinitrate, lisinsopril, clonidine, hydralazine, coreg,  Benazepril   PMH:   has a past medical history of Cancer ([place]), Carotid artery occlusion, Chronic kidney disease, Coronary artery disease, GERD (gastroesophageal reflux disease), Hyperlipidemia, Hypertension, Myocardial infarction [facility]), PVD (peripheral vascular disease) ([place]), and Shortness of breath.  PSH:    Past Surgical History:  Procedure Laterality Date  . abdomial angiogram  03-16-2014  . ABDOMINAL ANGIOGRAM N/A 03/16/2014   Procedure: ABDOMINAL ANGIOGRAM;  Surgeon: Rosetta Posner, MD;  Location: [facility] CATH LAB;  Service: Cardiovascular;  Laterality: N/A;  . ABDOMINAL AORTAGRAM N/A 12/03/2011   Procedure: ABDOMINAL AORTAGRAM;  Surgeon: Serafina Mitchell, MD;  Location: [facility] CATH LAB;  Service: Cardiovascular;  Laterality: N/A;  . CAROTID ENDARTERECTOMY     Right. With 46% stenosis in the right by recent Doppler  . COLONOSCOPY WITH PROPOFOL N/A 03/12/2016   Procedure: COLONOSCOPY WITH PROPOFOL;  Surgeon: [name], MD;  Location: [facility] ENDOSCOPY;  Service: Endoscopy;  Laterality: N/A;  . CORONARY ARTERY BYPASS GRAFT  1992  . left common iliac  12/03/2011  . Peripheral vascular revascularization     . RENAL ANGIOGRAM  03-16-2014  . RENAL ANGIOGRAM Left 12/03/2011   Procedure: RENAL ANGIOGRAM;  Surgeon: Serafina Mitchell, MD;  Location[facility] C[facility] LAB;  Service: Cardiovascular;  Laterality: Left;  lt renal artery stent  . RENAL ANGIOGRAM N/A 03/16/2014   Procedure: RENAL ANGIOGRAM;  Surgeon: Rosetta Posner, MD;  Location: [facility] CATH LAB;  Service: Cardiovascular;  Laterality: N/A;  . renal artery stenosis  12/03/2011  . SHOULDER ARTHROSCOPY     Right Amnioplasty; Acromioclavicular joint resection and arthroscopic debridement  . Surgery for Sleep Apnea      Current Outpatient Medications  Medication Sig Dispense Refill  . aspirin 81 MG tablet Take 81 mg by mouth daily.     Marland Kitchen atorvastatin (LIPITOR) 80 MG tablet Take 1 tablet (80 mg total) by mouth daily. (Patient taking differently: Take 40 mg by mouth daily. ) 90 tablet 4  . cholecalciferol (VITAMIN D) 1000 units tablet Take 1,000 Units by mouth daily.    . clopidogrel (PLAVIX) 75 MG tablet Take 1 tablet (75 mg total) by mouth daily. 90 tablet 3  . Coenzyme Q10 (CO Q-10) 100 MG CAPS Take 100 mg by mouth daily.    Marland Kitchen doxazosin (CARDURA) 8 MG tablet Take 1 tablet (8 mg total) by mouth 2 (two) times daily. 180 tablet 3  . ezetimibe (ZETIA) 10 MG tablet Take 1 tablet (10 mg total) by mouth daily. 90 tablet 3  . GARLIC PO Take 4,235 mg by mouth. Takes 7 tablets daily.    . hydrochlorothiazide (HYDRODIURIL) 25 MG tablet Take 1 tablet (25 mg total) by mouth daily. 90 tablet 3  . lansoprazole (PREVACID) 15 MG capsule Take 1 capsule (15 mg total) by mouth daily at 12 noon. (Patient taking differently: Take 30 mg by mouth daily at 12 noon. ) 30 capsule 6  . levothyroxine (SYNTHROID, LEVOTHROID) 25 MCG tablet Take 1 tablet (25 mcg total) by mouth daily before breakfast.  90 tablet 3  . losartan (COZAAR) 25 MG tablet Take 1 tablet (25 mg total) by mouth daily as needed (for elevated BP). 90 tablet 4  . nitroGLYCERIN (NITROLINGUAL) 0.4 MG/SPRAY spray Place 1 spray under the tongue every 5 (five) minutes x 3 doses as needed for chest pain. 12 g 3  . polyethylene glycol (MIRALAX / GLYCOLAX) packet Take 17 g by mouth daily. 90 packet 3  . Zn-Pyg Afri-Nettle-Saw Palmet (SAW PALMETTO COMPLEX PO) Take 1,725 mg by mouth daily.      No current facility-administered medications for this visit.      Allergies:   Hydralazine; Adhesive [tape]; Benazepril; Carvedilol; Clonidine derivatives; Lisinopril; Losartan; Hydrazine yellow [tartrazine]; Isosorbide dinitrate; and Isosorbide mononitrate   Social History:  The patient  reports that he quit smoking about 27 years ago. His smoking use included cigarettes. He quit smokeless tobacco use about 27 years ago. He reports that he drinks about 0.6 - 1.2 oz of alcohol per week. He reports that he does not use drugs.   Family History:   family history includes Heart disease in his  other; Stroke in his brother, mother, and sister.    Review of Systems: Review of Systems  Constitutional: Negative.   Respiratory: Negative.   Cardiovascular: Negative.   Gastrointestinal: Negative.   Musculoskeletal: Negative.   Neurological: Negative.   Psychiatric/Behavioral: The patient is nervous/anxious.   All other systems reviewed and are negative.    PHYSICAL EXAM: VS:  BP (!) 160/60 (BP Location: Left Arm, Patient Position: Sitting, Cuff Size: Normal)   Pulse 63   Ht 5\' 9"  (1.753 m)   Wt 185 lb (83.9 kg)   BMI 27.32 kg/m  , BMI Body mass index is 27.32 kg/m. Constitutional:  oriented to person, place, and time. No distress.  HENT:  Head: Normocephalic and atraumatic.  Eyes:  no discharge. No scleral icterus.  Neck: Normal range of motion. Neck supple. No JVD present.  Bilateral carotid bruits Cardiovascular: Normal rate,  regular rhythm, normal heart sounds and intact distal pulses. Exam reveals no gallop and no friction rub. No edema Decreased lower extremity pulses No murmur heard. Pulmonary/Chest: Effort normal and breath sounds normal. No stridor. No respiratory distress.  no wheezes.  no rales.  no tenderness.  Abdominal: Soft.  no distension.  no tenderness.  Musculoskeletal: Normal range of motion.  no  tenderness or deformity.  Neurological:  normal muscle tone. Coordination normal. No atrophy Skin: Skin is warm and dry. No rash noted. not diaphoretic.  Psychiatric:  normal mood and affect. behavior is normal. Thought content normal.   Recent Labs: 04/15/2018: ALT 16; BUN 16; Creatinine, Ser 0.86; Potassium 4.8; Sodium 140; TSH 5.290    Lipid Panel Lab Results  Component Value Date   CHOL 133 04/15/2018   HDL 36 (L) 04/15/2018   LDLCALC 73 04/15/2018   TRIG 121 04/15/2018      Wt Readings from Last 3 Encounters:  05/14/18 185 lb (83.9 kg)  05/12/18 184 lb (83.5 kg)  04/14/18 187 lb 12.8 oz (85.2 kg)       ASSESSMENT AND PLAN:  Atherosclerosis of native coronary artery of native heart without angina pectoris - Plan: EKG 12-Lead Currently with no symptoms of angina. No further workup at this time. Continue current medication regimen.Stable  Bilateral carotid artery stenosis - Plan: EKG 12-Lead Followed in [place], stable disease Stressed importance of weight loss, lifestyle modification to keep his lipid numbers low He had significant weight gain earlier this year when sedentary  PAD (peripheral artery disease) (HCC) - Plan: EKG 12-Lead Reports stable disease, no claudication symptoms Followed in [place] We will need to avoid  low blood pressure given mesenteric disease, vision problems with low blood pressure  Essential hypertension No medication changes, elevated in the office but typically well controlled at home per patient's wife 106 systolic  Mixed  hyperlipidemia continue Lipitor up to 80 mg daily with Zetia  Recent weight gain   Total encounter time more than 25 minutes  Greater than 50% was spent in counseling and coordination of care with the patient   Disposition:   F/U  12 months   Orders Placed This Encounter  Procedures  . EKG 12-Lead     Signed, Esmond Plants, M.D., Ph.D. 05/14/2018  [place], [place]

## 2018-05-14 ENCOUNTER — Encounter: Payer: Self-pay | Admitting: Family

## 2018-05-14 ENCOUNTER — Encounter: Payer: Self-pay | Admitting: Cardiovascular Disease

## 2018-05-14 ENCOUNTER — Ambulatory Visit: Payer: PPO | Admitting: Cardiovascular Disease

## 2018-05-14 VITALS — BP 160/60 | HR 63 | Ht 69.0 in | Wt 185.0 lb

## 2018-05-14 DIAGNOSIS — E782 Mixed hyperlipidemia: Secondary | ICD-10-CM

## 2018-05-14 DIAGNOSIS — I25118 Atherosclerotic heart disease of native coronary artery with other forms of angina pectoris: Secondary | ICD-10-CM

## 2018-05-14 DIAGNOSIS — I25718 Atherosclerosis of autologous vein coronary artery bypass graft(s) with other forms of angina pectoris: Secondary | ICD-10-CM | POA: Diagnosis not present

## 2018-05-14 DIAGNOSIS — I739 Peripheral vascular disease, unspecified: Secondary | ICD-10-CM | POA: Diagnosis not present

## 2018-05-14 DIAGNOSIS — I701 Atherosclerosis of renal artery: Secondary | ICD-10-CM | POA: Diagnosis not present

## 2018-05-14 DIAGNOSIS — I10 Essential (primary) hypertension: Secondary | ICD-10-CM | POA: Diagnosis not present

## 2018-05-14 DIAGNOSIS — I6523 Occlusion and stenosis of bilateral carotid arteries: Secondary | ICD-10-CM

## 2018-05-14 NOTE — Patient Instructions (Signed)

## 2018-06-10 DIAGNOSIS — C44319 Basal cell carcinoma of skin of other parts of face: Secondary | ICD-10-CM | POA: Diagnosis not present

## 2018-06-10 DIAGNOSIS — C44329 Squamous cell carcinoma of skin of other parts of face: Secondary | ICD-10-CM | POA: Diagnosis not present

## 2018-06-10 DIAGNOSIS — D2239 Melanocytic nevi of other parts of face: Secondary | ICD-10-CM | POA: Diagnosis not present

## 2018-06-18 DIAGNOSIS — R4189 Other symptoms and signs involving cognitive functions and awareness: Secondary | ICD-10-CM | POA: Diagnosis not present

## 2018-06-18 DIAGNOSIS — G4700 Insomnia, unspecified: Secondary | ICD-10-CM | POA: Diagnosis not present

## 2018-06-18 DIAGNOSIS — G4733 Obstructive sleep apnea (adult) (pediatric): Secondary | ICD-10-CM | POA: Diagnosis not present

## 2018-06-23 ENCOUNTER — Encounter: Payer: Self-pay | Admitting: Podiatry

## 2018-06-23 ENCOUNTER — Encounter

## 2018-06-23 ENCOUNTER — Ambulatory Visit: Payer: PPO | Admitting: Podiatry

## 2018-06-23 DIAGNOSIS — L851 Acquired keratosis [keratoderma] palmaris et plantaris: Secondary | ICD-10-CM

## 2018-06-23 DIAGNOSIS — L989 Disorder of the skin and subcutaneous tissue, unspecified: Secondary | ICD-10-CM | POA: Diagnosis not present

## 2018-06-25 NOTE — [place] Notes (Signed)
    Subjective: 77 year old male presenting today for follow up evaluation of a painful callus lesion of the right foot. He states the callus has gotten bigger and needs it trimmed again. He describes the pain as burning. He has not done anything for treatment. Walking and wearing certain shoes increases the pain. Patient presents today for further treatment and evaluation.   Past Medical History:  Diagnosis Date  . Cancer ([place])    basel cell carcinoma left ear  . Carotid artery occlusion    s/p right carotid endarterectomy  . Chronic kidney disease    renal artery stenosis  . Coronary artery disease    s/p coronary bypass graft surgery in 1992, stable  . GERD (gastroesophageal reflux disease)   . Hyperlipidemia   . Hypertension   . Myocardial infarction ([place])   . PVD (peripheral vascular disease) ([place])    s/p multiple revascularization procedures.  . Shortness of breath      Objective:  Physical Exam General: Alert and oriented x3 in no acute distress  Dermatology: Hyperkeratotic lesion present on the plantar aspect of the fifth MPJ right foot with a central core. Pain on palpation with a central nucleated core noted.  Skin is warm, dry and supple bilateral lower extremities. Negative for open lesions or macerations.  Vascular: Palpable pedal pulses bilaterally. No edema or erythema noted. Capillary refill within normal limits.  Neurological: Epicritic and protective threshold grossly intact bilaterally.   Musculoskeletal Exam: Pain on palpation at the keratotic lesion noted. Range of motion within normal limits bilateral. Muscle strength 5/5 in all groups bilateral.  Assessment: #1 porokeratosis fifth MPJ right foot #2 pain in right foot   Plan of Care:  #1 Patient evaluated #2 Excisional debridement of  keratoic lesion using a chisel blade was performed without incident.  #3 Treated area(s) with Salinocaine and dressed with light dressing. #4 Patient is to return to  the clinic PRN.   Edrick Kins, [place]

## 2018-06-30 ENCOUNTER — Ambulatory Visit (INDEPENDENT_AMBULATORY_CARE_PROVIDER_SITE_OTHER): Payer: PPO | Admitting: Family Medicine

## 2018-06-30 ENCOUNTER — Encounter: Payer: Self-pay | Admitting: Family Medicine

## 2018-06-30 VITALS — BP 152/80 | HR 56 | Temp 98.5°F | Resp 15 | Wt 186.6 lb

## 2018-06-30 DIAGNOSIS — R209 Unspecified disturbances of skin sensation: Secondary | ICD-10-CM

## 2018-06-30 DIAGNOSIS — Z1159 Encounter for screening for other viral diseases: Secondary | ICD-10-CM | POA: Diagnosis not present

## 2018-06-30 DIAGNOSIS — IMO0001 Reserved for inherently not codable concepts without codable children: Secondary | ICD-10-CM

## 2018-06-30 DIAGNOSIS — Z9189 Other specified personal risk factors, not elsewhere classified: Secondary | ICD-10-CM | POA: Diagnosis not present

## 2018-06-30 LAB — POCT GLYCOSYLATED HEMOGLOBIN (HGB A1C): HEMOGLOBIN A1C: 6 % — AB (ref 4.0–5.6)

## 2018-06-30 NOTE — [place] Notes (Signed)
  Subjective:     Patient ID: Clifford Aguilar, male   DOB: May 31, 1941, 77 y.o.   MRN: [id number] Chief Complaint  Patient presents with  . Numbness    Patient comes in office today with concerns of numbness in his toes for the past 6 months or more. Patient states that his feet get cold and has concerns it might be a circulation issue.    HPI States he has to wear socks at night. Reports both numbness and feeling cold. Has hx of peripheral vascular disease with multiple revascularization procedures.  Review of Systems     Objective:   Physical Exam  Constitutional: He appears well-developed and well-nourished. No distress.  Cardiovascular:  Pulses:      Dorsalis pedis pulses are 1+ on the right side, and 1+ on the left side.       Posterior tibial pulses are 1+ on the right side, and 1+ on the left side.  Neurological:  Sensation to nylon filament present in both feet.  Skin: Skin is warm.       Assessment:    1. Encounter for hepatitis C virus screening test for high risk patient - Hepatitis C Antibody  2. Paresthesias/numbness - CBC with Differential/Platelet - B12 - Folate - POCT glycosylated hemoglobin (Hb A1C)    Plan:    Further f/u pending lab results.

## 2018-06-30 NOTE — Patient Instructions (Signed)
We will call you with the lab results. 

## 2018-07-01 ENCOUNTER — Telehealth: Payer: Self-pay

## 2018-07-01 LAB — FOLATE: Folate: 18.6 ng/mL (ref >3.0)

## 2018-07-01 LAB — CBC WITH DIFFERENTIAL/PLATELET
BASOS ABS: 0 10*3/uL (ref 0.0–0.2)
Basos: 0 %
EOS (ABSOLUTE): 0.1 10*3/uL (ref 0.0–0.4)
Eos: 2 %
Hematocrit: 36 % — ABNORMAL LOW (ref 37.5–51.0)
Hemoglobin: 12.3 g/dL — ABNORMAL LOW (ref 13.0–17.7)
Immature Grans (Abs): 0 10*3/uL (ref 0.0–0.1)
Immature Granulocytes: 0 %
LYMPHS ABS: 1.6 10*3/uL (ref 0.7–3.1)
Lymphs: 23 %
MCH: 29.6 pg (ref 26.6–33.0)
MCHC: 34.2 g/dL (ref 31.5–35.7)
MCV: 87 fL (ref 79–97)
MONOCYTES: 9 %
MONOS ABS: 0.6 10*3/uL (ref 0.1–0.9)
NEUTROS ABS: 4.4 10*3/uL (ref 1.4–7.0)
Neutrophils: 66 %
Platelets: 196 10*3/uL (ref 150–450)
RBC: 4.16 x10E6/uL (ref 4.14–5.80)
RDW: 14 % (ref 12.3–15.4)
WBC: 6.7 10*3/uL (ref 3.4–10.8)

## 2018-07-01 LAB — VITAMIN B12: VITAMIN B 12: 449 pg/mL (ref 232–1245)

## 2018-07-01 LAB — HEPATITIS C ANTIBODY: Hep C Virus Ab: <0.1 s/co ratio (ref 0.0–0.9)

## 2018-07-01 NOTE — Telephone Encounter (Signed)
Patients wife was advised and lab was added. KW

## 2018-07-01 NOTE — Telephone Encounter (Signed)
-----   Message from Carmon Ginsberg, [place] sent at 07/01/2018  7:31 AM EDT ----- Hep C, B12 and Folate all ok. You have a mild anemia so we are going to add an additional lab to check. Kat Please add Ferritin to current draw.

## 2018-07-03 ENCOUNTER — Telehealth: Payer: Self-pay

## 2018-07-03 LAB — SPECIMEN STATUS REPORT

## 2018-07-03 LAB — FERRITIN: FERRITIN: 41 ng/mL (ref 30–400)

## 2018-07-03 NOTE — Telephone Encounter (Signed)
Pt returned call. Thanks TNP °

## 2018-07-03 NOTE — Telephone Encounter (Signed)
Patients wife was advised. KW 

## 2018-07-03 NOTE — Telephone Encounter (Signed)
[id number] 

## 2018-07-03 NOTE — Telephone Encounter (Signed)
-----   Message from Carmon Ginsberg, [place] sent at 07/03/2018  7:37 AM EDT ----- [place] level  Ok. You may wish to discuss your feet symptoms with your vascular surgeon, Dr. Donnetta Hutching.

## 2018-07-06 ENCOUNTER — Telehealth: Payer: Self-pay | Admitting: *Deleted

## 2018-07-06 NOTE — Telephone Encounter (Signed)
Call and requested appointment to see Clifford Aguilar due to increase numbness in feet and legs. Denies any coldness. Claims color and temperature age good. Has had a recent visit with PCP who recommended notifying [facility]. Instructed to report to [facility] ER for any worsening condition.

## 2018-07-14 DIAGNOSIS — N281 Cyst of kidney, acquired: Secondary | ICD-10-CM | POA: Diagnosis not present

## 2018-07-15 ENCOUNTER — Other Ambulatory Visit: Payer: Self-pay | Admitting: Nephrology

## 2018-07-15 DIAGNOSIS — N281 Cyst of kidney, acquired: Secondary | ICD-10-CM

## 2018-07-22 ENCOUNTER — Ambulatory Visit: Payer: PPO

## 2018-07-28 DIAGNOSIS — I10 Essential (primary) hypertension: Secondary | ICD-10-CM | POA: Diagnosis not present

## 2018-07-28 DIAGNOSIS — G4733 Obstructive sleep apnea (adult) (pediatric): Secondary | ICD-10-CM | POA: Diagnosis not present

## 2018-07-29 ENCOUNTER — Ambulatory Visit
Admission: RE | Admit: 2018-07-29 | Discharge: 2018-07-29 | Disposition: A | Discharge status: Home / Self Care | Payer: PPO | Source: Ambulatory Visit | Attending: Nephrology | Admitting: Nephrology

## 2018-07-29 DIAGNOSIS — N281 Cyst of kidney, acquired: Secondary | ICD-10-CM | POA: Diagnosis not present

## 2018-07-30 ENCOUNTER — Other Ambulatory Visit: Payer: Self-pay

## 2018-07-30 ENCOUNTER — Ambulatory Visit: Payer: PPO | Admitting: Family

## 2018-07-30 ENCOUNTER — Encounter: Payer: Self-pay | Admitting: Family

## 2018-07-30 VITALS — BP 165/72 | HR 56 | Resp 20 | Ht 66.0 in | Wt 185.0 lb

## 2018-07-30 DIAGNOSIS — R208 Other disturbances of skin sensation: Secondary | ICD-10-CM | POA: Diagnosis not present

## 2018-07-30 DIAGNOSIS — R200 Anesthesia of skin: Secondary | ICD-10-CM | POA: Diagnosis not present

## 2018-07-30 DIAGNOSIS — Z9889 Other specified postprocedural states: Secondary | ICD-10-CM | POA: Diagnosis not present

## 2018-07-30 DIAGNOSIS — Z95828 Presence of other vascular implants and grafts: Secondary | ICD-10-CM | POA: Diagnosis not present

## 2018-07-30 DIAGNOSIS — I779 Disorder of arteries and arterioles, unspecified: Secondary | ICD-10-CM

## 2018-07-30 DIAGNOSIS — I6522 Occlusion and stenosis of left carotid artery: Secondary | ICD-10-CM | POA: Diagnosis not present

## 2018-07-30 DIAGNOSIS — Z87891 Personal history of nicotine dependence: Secondary | ICD-10-CM | POA: Diagnosis not present

## 2018-07-30 NOTE — Progress Notes (Signed)
[facility] [place]   CC: worsening numbness and burning in the soles of his feet with history of peripheral artery occlusive disease  History of Present Illness Clifford Aguilar is a 77 y.o. male whom Dr. Donnetta Hutching has been monitoring for diffuse peripheral vascular occlusive disease.  He is status post prior right carotid endarterectomy in 1993; he has a known occlusion of his left internal carotid artery. He is status post bilateral renal artery stenting. Most recently his right renal artery stenitng was in March 2015. He is status post left iliac artery stenting as well.  He does have claudicationin both calves, left worse than right,but is able to walk and do his routine activities despite this. He has been stable from a cardiac standpoint.  He reports that he has some hypertension,blood pressure is rarely greater than 427 systolic. Pt denies any history of stroke or TIA, denies dizziness, denies steal symptoms in either hand/arm.  [place] his treadmill 15 minutes andusesstationary bike 15 minutes, 4 days/week.  He denies non healing wounds.  He states his blood pressure at home is about 140/80, states he knows his blood pressure is up because he is in a medical office.  He returns today after phone call received from pt and requested appointment to see Vinnie Level due to increase numbness in feet and legs, started about 2-3 months ago, since he saw me in May 2019. Denies any coldness. Claims color and temperature age good. Has had a recent visit with PCP who recommended notifying [facility].     Pt Diabetic: No Pt smoker: former smoker, quit in 1992  Pt meds include: Statin :Yes Betablocker: No ASA: Yes Other anticoagulants/antiplatelets: Plavix     Past Medical History:  Diagnosis Date  . Cancer ([place])    basel cell carcinoma left ear  . Carotid artery occlusion    s/p right carotid endarterectomy  . Chronic kidney disease    renal artery  stenosis  . Coronary artery disease    s/p coronary bypass graft surgery in 1992, stable  . GERD (gastroesophageal reflux disease)   . Hyperlipidemia   . Hypertension   . Myocardial infarction ([place])   . PVD (peripheral vascular disease) ([place])    s/p multiple revascularization procedures.  . Shortness of breath     Social History Social History   Tobacco Use  . Smoking status: Former Smoker    Types: Cigarettes    Last attempt to quit: 12/30/1990    Years since quitting: 27.6  . Smokeless tobacco: Former Systems developer    Quit date: 01/10/1991  . Tobacco comment: quit smoking in 1992  Substance Use Topics  . Alcohol use: Yes    Alcohol/week: 0.6 - 1.2 oz    Types: 1 - 2 Glasses of wine per week    Comment: 1-2 a week  . Drug use: No    Family History Family History  Problem Relation Age of Onset  . Stroke Mother   . Heart disease Other   . Stroke Sister   . Stroke Brother     Past Surgical History:  Procedure Laterality Date  . abdomial angiogram  03-16-2014  . ABDOMINAL ANGIOGRAM N/A 03/16/2014   Procedure: ABDOMINAL ANGIOGRAM;  Surgeon: Rosetta Posner, MD;  Location[facility] CATH LAB;  Service: Cardiovascular;  Laterality: N/A;  . ABDOMINAL AORTAGRAM N/A 12/03/2011   Procedure: ABDOMINAL AORTAGRAM;  Surgeon: Serafina Mitchell, MD;  Location: [facility] CATH LAB;  Service: Cardiovascular;  Laterality: N/A;  . CAROTID  ENDARTERECTOMY     Right. With 46% stenosis in the right by recent Doppler  . COLONOSCOPY WITH PROPOFOL N/A 03/12/2016   Procedure: COLONOSCOPY WITH PROPOFOL;  Surgeon: [name], MD;  Location: [facility] ENDOSCOPY;  Service: Endoscopy;  Laterality: N/A;  . CORONARY ARTERY BYPASS GRAFT  1992  . left common iliac  12/03/2011  . Peripheral vascular revascularization     . RENAL ANGIOGRAM  03-16-2014  . RENAL ANGIOGRAM Left 12/03/2011   Procedure: RENAL ANGIOGRAM;  Surgeon: Serafina Mitchell, MD;  Location: [facility] CATH LAB;  Service: Cardiovascular;  Laterality: Left;  lt renal artery stent   . RENAL ANGIOGRAM N/A 03/16/2014   Procedure: RENAL ANGIOGRAM;  Surgeon: Rosetta Posner, MD;  Location: [facility] CATH LAB;  Service: Cardiovascular;  Laterality: N/A;  . renal artery stenosis  12/03/2011  . SHOULDER ARTHROSCOPY     Right Amnioplasty; Acromioclavicular joint resection and arthroscopic debridement  . Surgery for Sleep Apnea      Allergies  Allergen Reactions  . Hydralazine Rash  . Adhesive [Tape]     SKIN SENSITIVITY  . Benazepril     Nausea, vomiting and diarrhea  . Carvedilol     Blurred visino  . Clonidine Derivatives Other (See Comments)    Caused blindness in left eye for the days on the drug  . Latex Other (See Comments)  . Lisinopril Other (See Comments)    Blurred vision   . Losartan     Blurry vision if doses>25mg . Only use it for his BP is >170  . Trazodone Other (See Comments)    Cognitive impairment   . Hydrazine Yellow [Tartrazine] Itching and Rash  . Isosorbide Dinitrate Rash  . Isosorbide Mononitrate Rash    Current Outpatient Medications  Medication Sig Dispense Refill  . aspirin 81 MG tablet Take 81 mg by mouth daily.     Marland Kitchen atorvastatin (LIPITOR) 80 MG tablet Take 1 tablet (80 mg total) by mouth daily. (Patient taking differently: Take 40 mg by mouth daily. ) 90 tablet 4  . cholecalciferol (VITAMIN D) 1000 units tablet Take 1,000 Units by mouth daily.    . clopidogrel (PLAVIX) 75 MG tablet Take 1 tablet (75 mg total) by mouth daily. 90 tablet 3  . Coenzyme Q10 (CO Q-10) 100 MG CAPS Take 100 mg by mouth daily.    Marland Kitchen doxazosin (CARDURA) 8 MG tablet Take 1 tablet (8 mg total) by mouth 2 (two) times daily. 180 tablet 3  . ezetimibe (ZETIA) 10 MG tablet Take 1 tablet (10 mg total) by mouth daily. 90 tablet 3  . GARLIC PO Take 3,846 mg by mouth. Takes 7 tablets daily.    . hydrochlorothiazide (HYDRODIURIL) 25 MG tablet Take 1 tablet (25 mg total) by mouth daily. 90 tablet 3  . lansoprazole (PREVACID) 15 MG capsule Take 1 capsule (15 mg total) by mouth  daily at 12 noon. (Patient taking differently: Take 30 mg by mouth daily at 12 noon. ) 30 capsule 6  . levothyroxine (SYNTHROID, LEVOTHROID) 25 MCG tablet Take 1 tablet (25 mcg total) by mouth daily before breakfast. 90 tablet 3  . losartan (COZAAR) 25 MG tablet Take 1 tablet (25 mg total) by mouth daily as needed (for elevated BP). 90 tablet 4  . nitroGLYCERIN (NITROLINGUAL) 0.4 MG/SPRAY spray Place 1 spray under the tongue every 5 (five) minutes x 3 doses as needed for chest pain. 12 g 3  . polyethylene glycol (MIRALAX / GLYCOLAX) packet Take 17 g by mouth daily.  90 packet 3  . Zn-Pyg Afri-Nettle-Saw Palmet (SAW PALMETTO COMPLEX PO) Take 1,725 mg by mouth daily.      No current facility-administered medications for this visit.     ROS: See HPI for pertinent positives and negatives.   Physical Examination  Vitals:   07/30/18 1221 07/30/18 1223  BP: (!) 172/64 (!) 165/72  Pulse: (!) 56   Resp: 20   SpO2: 96%   Weight: 185 lb (83.9 kg)   Height: 5\' 6"  (1.676 m)    Body mass index is 29.86 kg/m.  General:WDWNmalein NAD Gait:Normal HENT:no gross abnormalities  Eyes: PERRLA Pulmonary:normal non-labored breathing, good air movement in all fields,no rales, rhonchi, or wheezing Cardiac:RRR, no murmur detected Abdomen: soft, NT, no masses palpated Skin:no rashes, no ulcers; no cellulitis.   VASCULAR EXAM  Carotid Bruits Right Left   Positive Positive   Abdominal aortic pulseis not palpable Radial pulses are2+ palpablebilaterally  VASCULAR EXAM: Extremitieswithoutischemic changes, withoutGangrene; withoutopen wounds.  LE Pulses Right Left  FEMORAL 2+ palpable 2+ palpable  POPLITEAL not palpable not palpable  POSTERIOR TIBIAL not palpable, + Doppler signal not palpable, + Doppler signal  DORSALIS PEDIS ANTERIOR TIBIAL not palpable, + Doppler signal not palpable, no Doppler signal       PERONEAL +  Doppler signal + Doppler signal   Musculoskeletal: no muscle wasting or atrophy; no peripheral edema Neurologic:A&O X 3; appropriateaffect, sensation is normal,MOTOR FUNCTION: 5/5 Symmetric, CN 2-12 intact, speechis fluent/normal.   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 He is status post bilateral renal artery stenting. Most recently his right renal artery stenitng was in March 2015. He is status post left iliac artery stenting as well. He has moderate claudication in both calves, nosigns of ischemia in his feet/legs. He has a remote history of several small strokes as identified on brain imaging wife states, but he does not recall any stroke or TIA sx's.  His blood pressure is elevated now, but at home remains in controltaking three antihypertensive medcations.If his blood pressure drops below a certain threshold, vision in his left eye becomes blurry.   Fortunately he does not have DM and he quit smoking in 1992.  He does not have weakness or pain in his legs while walking on his treadmill or using stationary bike, but dose when he walks outside, legs feel tired after walking in his yard and working in his garden, relieved by rest.  His concern is worsening numbness and burning in the soles of both feet equally, numbness is constant, burning only with walking.  No signs of ischemia in his feet or legs.   Pt has seen his PCP re 3.4 cm x 4.4 cm left renal cyst, and right renal cyst of1.4 cm x 2.2 cm.     DATA  None since 05-12-18  Abdominal/Visceral Duplex (05-12-18): Right: normal sized kidney. >60% stenosis (417cm/s at origin, 395  Cm/s proximal. Right renal cyst measures 1.4 cm x 2.2 cm. Left: Normal size of kidney. >60% stenosis (287 cm/s at  origin, 235 cm/s at mid, 220 cm/s at distal). Left renal cyst measures 3.4 cm x 4.4 cm No significant change compared to the exam on 11-04-17.    Bilateral Aortoiliac Artery Duplex  (05-12-18): >50% stenosis in the distal EIA bilaterally. Left EIA stent not  visualized. Left CIA stent difficult to visualize secondary to acoustic shadowing from calcified plaque; all monophasic waveforms. 346 cm/s at right EIA distal (was 559 cm/s on 11-04-17) 430 cm/s at left EIA distal (was 425 cm/s on 11-04-17)   CarotidDuplex(11/04/17): 40-59% right ICA stenosis. Known left ICA occlusion. Bilateral ECA stenosis. Right vertebral artery flow is not detected, left is antegrade. Rightsubclavianartery waveforms are biphasic, left aretriphasic.  No significant change compared to previous examson10/21/16and 10/22/16.    ABI(Date:11/04/2017):  R:  ? ABI:0.53(was 0.47 on 10-22-16),  ? BJ:YNWG ? NF:AOZH ? TBI:0.32  L:  ? ABI:0.44(was 0.41),  ? YQ:MVHQ ? IO:NGEX ? TBI:0.25 Stable bilateral ABI with moderate disease on the right, severe disease on the left, all monophasic waveforms.    PLAN:   Based on today's exam and non-invasive vascular lab results,schedule ABI's ASAP and see Dr. Donnetta Hutching afterward to discuss whether vascular intervention would help burning and numbness in feet.   Continue daily treadmill and stationary bike use.   I advised pt to notify us if he develops concerns re worsening circulation in his feet or legs.    I discussed in depth with the patient the nature of atherosclerosis, and emphasized the importance of maximal medical management including strict control of blood pressure, blood glucose, and lipid levels, obtaining regular exercise, and continued cessation of smoking.  The patient is aware that without maximal medical management the underlying atherosclerotic disease process will progress, limiting the benefit of any interventions.  The  patient was given information about PAD including signs, symptoms, treatment, what symptoms should prompt the patient to seek immediate medical care, and risk reduction measures to take.  Clemon Chambers, RN, MSN, FNP-C [facility] [employer] Phone: [phone]  Clinic MD: [facility]  07/30/18 12:48 PM

## 2018-08-03 ENCOUNTER — Other Ambulatory Visit: Payer: Self-pay

## 2018-08-03 DIAGNOSIS — R200 Anesthesia of skin: Secondary | ICD-10-CM

## 2018-08-03 DIAGNOSIS — Z01812 Encounter for preprocedural laboratory examination: Secondary | ICD-10-CM

## 2018-08-03 DIAGNOSIS — I739 Peripheral vascular disease, unspecified: Secondary | ICD-10-CM

## 2018-08-03 DIAGNOSIS — I779 Disorder of arteries and arterioles, unspecified: Secondary | ICD-10-CM

## 2018-08-03 DIAGNOSIS — R208 Other disturbances of skin sensation: Secondary | ICD-10-CM

## 2018-08-04 ENCOUNTER — Ambulatory Visit (HOSPITAL_COMMUNITY)
Admission: RE | Admit: 2018-08-04 | Discharge: 2018-08-04 | Disposition: A | Discharge status: Home / Self Care | Payer: PPO | Source: Ambulatory Visit | Attending: Vascular Surgery | Admitting: Vascular Surgery

## 2018-08-04 DIAGNOSIS — I739 Peripheral vascular disease, unspecified: Secondary | ICD-10-CM | POA: Insufficient documentation

## 2018-08-04 DIAGNOSIS — R200 Anesthesia of skin: Secondary | ICD-10-CM | POA: Diagnosis not present

## 2018-08-04 DIAGNOSIS — Z01812 Encounter for preprocedural laboratory examination: Secondary | ICD-10-CM | POA: Insufficient documentation

## 2018-08-04 DIAGNOSIS — R208 Other disturbances of skin sensation: Secondary | ICD-10-CM | POA: Diagnosis not present

## 2018-08-04 DIAGNOSIS — I779 Disorder of arteries and arterioles, unspecified: Secondary | ICD-10-CM | POA: Insufficient documentation

## 2018-08-11 ENCOUNTER — Ambulatory Visit: Payer: PPO | Admitting: Vascular Surgery

## 2018-08-11 ENCOUNTER — Encounter: Payer: Self-pay | Admitting: Vascular Surgery

## 2018-08-11 ENCOUNTER — Other Ambulatory Visit: Payer: Self-pay

## 2018-08-11 VITALS — BP 134/67 | HR 53 | Temp 97.9°F | Resp 18 | Ht 69.0 in | Wt 181.0 lb

## 2018-08-11 DIAGNOSIS — I779 Disorder of arteries and arterioles, unspecified: Secondary | ICD-10-CM

## 2018-08-11 DIAGNOSIS — R200 Anesthesia of skin: Secondary | ICD-10-CM | POA: Diagnosis not present

## 2018-08-11 NOTE — Progress Notes (Signed)
[facility]ist of [place]  Patient name: Clifford Aguilar MRN: [id number] DOB: 14-Jun-1941 Sex: male  REASON FOR VISIT: Discuss lower extremity arterial insufficiency  HPI: Clifford Aguilar is a 76 y.o. male well-known to our practice with greater than 20-year history of peripheral vascular occlusive disease dating back to right carotid endarterectomy in 1993.  He has had subsequent renal artery stenoses treated with angioplasty and iliac artery angioplasty in one setting.  He has known infrainguinal occlusive disease and has had very poor ankle arm indices for years.  This has and not progressed.  He has been extremely compliant with recommendations for his care and stop smoking in 1993 and is on a very active exercise program and attempts to eat well.  He does have numbness on the bottom of his feet which is classic neuropathy.  He does not have any arterial rest pain.  He does have stable calf claudication.  Past Medical History:  Diagnosis Date  . Cancer ([place])    basel cell carcinoma left ear  . Carotid artery occlusion    s/p right carotid endarterectomy  . Chronic kidney disease    renal artery stenosis  . Coronary artery disease    s/p coronary bypass graft surgery in 1992, stable  . GERD (gastroesophageal reflux disease)   . Hyperlipidemia   . Hypertension   . Myocardial infarction ([place])   . PVD (peripheral vascular disease) ([place])    s/p multiple revascularization procedures.  . Shortness of breath     Family History  Problem Relation Age of Onset  . Stroke Mother   . Heart disease Other   . Stroke Sister   . Stroke Brother     SOCIAL HISTORY: Social History   Tobacco Use  . Smoking status: Former Smoker    Types: Cigarettes    Last attempt to quit: 12/30/1990    Years since quitting: 27.6  . Smokeless tobacco: Former Systems developer    Quit date: 01/10/1991  . Tobacco comment: quit smoking in 1992  Substance Use Topics  .  Alcohol use: Yes    Alcohol/week: 1.0 - 2.0 standard drinks    Types: 1 - 2 Glasses of wine per week    Comment: 1-2 a week    Allergies  Allergen Reactions  . Hydralazine Rash  . Adhesive [Tape]     SKIN SENSITIVITY  . Benazepril     Nausea, vomiting and diarrhea  . Carvedilol     Blurred visino  . Clonidine Derivatives Other (See Comments)    Caused blindness in left eye for the days on the drug  . Latex Other (See Comments)  . Lisinopril Other (See Comments)    Blurred vision   . Losartan     Blurry vision if doses>25mg . Only use it for his BP is >170  . Trazodone Other (See Comments)    Cognitive impairment   . Hydrazine Yellow [Tartrazine] Itching and Rash  . Isosorbide Dinitrate Rash  . Isosorbide Mononitrate Rash    Current Outpatient Medications  Medication Sig Dispense Refill  . aspirin 81 MG tablet Take 81 mg by mouth daily.     Marland Kitchen atorvastatin (LIPITOR) 80 MG tablet Take 1 tablet (80 mg total) by mouth daily. (Patient taking differently: Take 40 mg by mouth daily. ) 90 tablet 4  . cholecalciferol (VITAMIN D) 1000 units tablet Take 1,000 Units by mouth daily.    . clopidogrel (PLAVIX) 75 MG tablet Take 1 tablet (75 mg  total) by mouth daily. 90 tablet 3  . Coenzyme Q10 (CO Q-10) 100 MG CAPS Take 100 mg by mouth daily.    Marland Kitchen doxazosin (CARDURA) 8 MG tablet Take 1 tablet (8 mg total) by mouth 2 (two) times daily. 180 tablet 3  . ezetimibe (ZETIA) 10 MG tablet Take 1 tablet (10 mg total) by mouth daily. 90 tablet 3  . GARLIC PO Take 1,610 mg by mouth. Takes 7 tablets daily.    . hydrochlorothiazide (HYDRODIURIL) 25 MG tablet Take 1 tablet (25 mg total) by mouth daily. 90 tablet 3  . lansoprazole (PREVACID) 15 MG capsule Take 1 capsule (15 mg total) by mouth daily at 12 noon. (Patient taking differently: Take 30 mg by mouth daily at 12 noon. ) 30 capsule 6  . levothyroxine (SYNTHROID, LEVOTHROID) 25 MCG tablet Take 1 tablet (25 mcg total) by mouth daily before breakfast.  90 tablet 3  . losartan (COZAAR) 25 MG tablet Take 1 tablet (25 mg total) by mouth daily as needed (for elevated BP). 90 tablet 4  . nitroGLYCERIN (NITROLINGUAL) 0.4 MG/SPRAY spray Place 1 spray under the tongue every 5 (five) minutes x 3 doses as needed for chest pain. 12 g 3  . polyethylene glycol (MIRALAX / GLYCOLAX) packet Take 17 g by mouth daily. 90 packet 3  . Zn-Pyg Afri-Nettle-Saw Palmet (SAW PALMETTO COMPLEX PO) Take 1,725 mg by mouth daily.      No current facility-administered medications for this visit.     REVIEW OF SYSTEMS:  [X]  denotes positive finding, [ ]  denotes negative finding Cardiac  Comments:  Chest pain or chest pressure:    Shortness of breath upon exertion:    Short of breath when lying flat:    Irregular heart rhythm:        Vascular    Pain in calf, thigh, or hip brought on by ambulation: x   Pain in feet at night that wakes you up from your sleep:     Blood clot in your veins:    Leg swelling:           PHYSICAL EXAM: Vitals:   08/11/18 1233 08/11/18 1240  BP: (!) 154/68 134/67  Pulse: 88 (!) 53  Resp: 18   Temp: 97.9 F (36.6 C)   TempSrc: Oral   SpO2: 97%   Weight: 181 lb (82.1 kg)   Height: 5\' 9"  (1.753 m)     GENERAL: The patient is a well-nourished male, in no acute distress. The vital signs are documented above. CARDIOVASCULAR: Carotids without bruits bilaterally.  2+ radial pulses bilaterally.  2+ femoral pulses bilaterally.  Absent popliteal and distal pulses. PULMONARY: There is good air exchange  MUSCULOSKELETAL: There are no major deformities or cyanosis. NEUROLOGIC: No focal weakness or paresthesias are detected. SKIN: There are no ulcers or rashes noted. PSYCHIATRIC: The patient has a normal affect.  DATA:  Recent noninvasive vascular laboratory studies showed no particular change.  Ankle arm index is 0.33 on the right and 0.37 on the left monophasic waveforms bilaterally  MEDICAL ISSUES: Do not feel his numbness is related  to arterial insufficiency and is more typical of typical neuropathy.  Again explained symptoms of arterial rest pain and also symptoms of tissue loss.  He will notify us immediately should this occur.  Otherwise we will see him in 1 year as planned with repeat noninvasive studies    Rosetta Posner, MD [facility] Tel [phone] Pager (  [phone]  

## 2018-08-11 NOTE — Progress Notes (Signed)
Vitals:   08/11/18 1233  BP: (!) 154/68  Pulse: 88  Resp: 18  Temp: 97.9 F (36.6 C)  TempSrc: Oral  SpO2: 97%  Weight: 181 lb (82.1 kg)  Height: 5\' 9"  (1.753 m)

## 2018-08-28 DIAGNOSIS — I10 Essential (primary) hypertension: Secondary | ICD-10-CM | POA: Diagnosis not present

## 2018-08-28 DIAGNOSIS — G4733 Obstructive sleep apnea (adult) (pediatric): Secondary | ICD-10-CM | POA: Diagnosis not present

## 2018-09-09 ENCOUNTER — Encounter: Payer: Self-pay | Admitting: Family Medicine

## 2018-09-18 ENCOUNTER — Ambulatory Visit: Payer: PPO | Admitting: Family Medicine

## 2018-09-22 ENCOUNTER — Encounter: Payer: PPO | Admitting: Podiatry

## 2018-09-24 DIAGNOSIS — F0280 Dementia in other diseases classified elsewhere without behavioral disturbance: Secondary | ICD-10-CM | POA: Diagnosis not present

## 2018-09-24 DIAGNOSIS — F0150 Vascular dementia without behavioral disturbance: Secondary | ICD-10-CM | POA: Diagnosis not present

## 2018-09-24 DIAGNOSIS — G309 Alzheimer's disease, unspecified: Secondary | ICD-10-CM | POA: Diagnosis not present

## 2018-09-27 NOTE — Progress Notes (Signed)
This encounter was created in error - please disregard.

## 2018-09-28 DIAGNOSIS — G4733 Obstructive sleep apnea (adult) (pediatric): Secondary | ICD-10-CM | POA: Diagnosis not present

## 2018-09-28 DIAGNOSIS — I10 Essential (primary) hypertension: Secondary | ICD-10-CM | POA: Diagnosis not present

## 2018-10-13 ENCOUNTER — Ambulatory Visit: Payer: PPO | Admitting: Podiatry

## 2018-10-22 DIAGNOSIS — G4733 Obstructive sleep apnea (adult) (pediatric): Secondary | ICD-10-CM | POA: Diagnosis not present

## 2018-10-22 DIAGNOSIS — I10 Essential (primary) hypertension: Secondary | ICD-10-CM | POA: Diagnosis not present

## 2018-11-03 ENCOUNTER — Encounter: Payer: Self-pay | Admitting: Podiatry

## 2018-11-03 ENCOUNTER — Ambulatory Visit: Payer: PPO | Admitting: Podiatry

## 2018-11-03 DIAGNOSIS — L989 Disorder of the skin and subcutaneous tissue, unspecified: Secondary | ICD-10-CM | POA: Diagnosis not present

## 2018-11-05 NOTE — Progress Notes (Signed)
    Subjective: 77 year old male presenting today for follow up evaluation of painful callus lesions noted to bilateral feet, left worse than the right, that has been ongoing for several years. Walking increases his pain. He has not done anything for treatment at home. Patient presents today for further treatment and evaluation.   Past Medical History:  Diagnosis Date  . Cancer ([place])    basel cell carcinoma left ear  . Carotid artery occlusion    s/p right carotid endarterectomy  . Chronic kidney disease    renal artery stenosis  . Coronary artery disease    s/p coronary bypass graft surgery in 1992, stable  . GERD (gastroesophageal reflux disease)   . Hyperlipidemia   . Hypertension   . Myocardial infarction ([place])   . PVD (peripheral vascular disease) ([place])    s/p multiple revascularization procedures.  . Shortness of breath      Objective:  Physical Exam General: Alert and oriented x3 in no acute distress  Dermatology: Hyperkeratotic lesions present on the plantar aspect of the fifth MPJ of the bilateral feet. Pain on palpation with a central nucleated core noted.  Skin is warm, dry and supple bilateral lower extremities. Negative for open lesions or macerations.  Vascular: Palpable pedal pulses bilaterally. No edema or erythema noted. Capillary refill within normal limits.  Neurological: Epicritic and protective threshold grossly intact bilaterally.   Musculoskeletal Exam: Pain on palpation at the keratotic lesions noted. Range of motion within normal limits bilateral. Muscle strength 5/5 in all groups bilateral.  Assessment: #1 porokeratosis fifth MPJs bilateral   Plan of Care:  #1 Patient evaluated #2 Excisional debridement of  keratoic lesion using a chisel blade was performed without incident.  #3 Treated area(s) with Salinocaine and dressed with light dressing. #4 Recommended good shoe gear.  #5 Continue using custom orthotics.  #6 Patient is to return to the  clinic PRN.   Edrick Kins, [place]

## 2018-11-11 DIAGNOSIS — C44612 Basal cell carcinoma of skin of right upper limb, including shoulder: Secondary | ICD-10-CM | POA: Diagnosis not present

## 2018-11-11 DIAGNOSIS — L570 Actinic keratosis: Secondary | ICD-10-CM | POA: Diagnosis not present

## 2018-11-11 DIAGNOSIS — Z08 Encounter for follow-up examination after completed treatment for malignant neoplasm: Secondary | ICD-10-CM | POA: Diagnosis not present

## 2018-11-11 DIAGNOSIS — X32XXXA Exposure to sunlight, initial encounter: Secondary | ICD-10-CM | POA: Diagnosis not present

## 2018-11-11 DIAGNOSIS — D485 Neoplasm of uncertain behavior of skin: Secondary | ICD-10-CM | POA: Diagnosis not present

## 2018-11-11 DIAGNOSIS — D044 Carcinoma in situ of skin of scalp and neck: Secondary | ICD-10-CM | POA: Diagnosis not present

## 2018-11-11 DIAGNOSIS — Z85828 Personal history of other malignant neoplasm of skin: Secondary | ICD-10-CM | POA: Diagnosis not present

## 2018-11-22 DIAGNOSIS — I10 Essential (primary) hypertension: Secondary | ICD-10-CM | POA: Diagnosis not present

## 2018-11-22 DIAGNOSIS — G4733 Obstructive sleep apnea (adult) (pediatric): Secondary | ICD-10-CM | POA: Diagnosis not present

## 2018-12-02 DIAGNOSIS — D044 Carcinoma in situ of skin of scalp and neck: Secondary | ICD-10-CM | POA: Diagnosis not present

## 2018-12-02 DIAGNOSIS — C44612 Basal cell carcinoma of skin of right upper limb, including shoulder: Secondary | ICD-10-CM | POA: Diagnosis not present

## 2018-12-07 ENCOUNTER — Emergency Department: Payer: PPO

## 2018-12-07 ENCOUNTER — Inpatient Hospital Stay
Admission: EM | Admit: 2018-12-07 | Discharge: 2018-12-09 | DRG: 287 | Disposition: A | Discharge status: Home / Self Care | Payer: PPO | Attending: Internal Medicine | Admitting: Internal Medicine

## 2018-12-07 ENCOUNTER — Encounter: Payer: Self-pay | Admitting: Emergency Medicine

## 2018-12-07 ENCOUNTER — Other Ambulatory Visit: Payer: Self-pay

## 2018-12-07 DIAGNOSIS — N189 Chronic kidney disease, unspecified: Secondary | ICD-10-CM | POA: Diagnosis present

## 2018-12-07 DIAGNOSIS — Z7982 Long term (current) use of aspirin: Secondary | ICD-10-CM

## 2018-12-07 DIAGNOSIS — Z87891 Personal history of nicotine dependence: Secondary | ICD-10-CM | POA: Diagnosis not present

## 2018-12-07 DIAGNOSIS — I129 Hypertensive chronic kidney disease with stage 1 through stage 4 chronic kidney disease, or unspecified chronic kidney disease: Secondary | ICD-10-CM | POA: Diagnosis present

## 2018-12-07 DIAGNOSIS — Z885 Allergy status to narcotic agent status: Secondary | ICD-10-CM

## 2018-12-07 DIAGNOSIS — N400 Enlarged prostate without lower urinary tract symptoms: Secondary | ICD-10-CM | POA: Diagnosis present

## 2018-12-07 DIAGNOSIS — E039 Hypothyroidism, unspecified: Secondary | ICD-10-CM | POA: Diagnosis present

## 2018-12-07 DIAGNOSIS — K219 Gastro-esophageal reflux disease without esophagitis: Secondary | ICD-10-CM | POA: Diagnosis present

## 2018-12-07 DIAGNOSIS — I739 Peripheral vascular disease, unspecified: Secondary | ICD-10-CM | POA: Diagnosis present

## 2018-12-07 DIAGNOSIS — Z823 Family history of stroke: Secondary | ICD-10-CM

## 2018-12-07 DIAGNOSIS — I480 Paroxysmal atrial fibrillation: Secondary | ICD-10-CM | POA: Diagnosis present

## 2018-12-07 DIAGNOSIS — Z888 Allergy status to other drugs, medicaments and biological substances status: Secondary | ICD-10-CM | POA: Diagnosis not present

## 2018-12-07 DIAGNOSIS — Z79890 Hormone replacement therapy (postmenopausal): Secondary | ICD-10-CM | POA: Diagnosis not present

## 2018-12-07 DIAGNOSIS — E785 Hyperlipidemia, unspecified: Secondary | ICD-10-CM | POA: Diagnosis present

## 2018-12-07 DIAGNOSIS — I2582 Chronic total occlusion of coronary artery: Secondary | ICD-10-CM | POA: Diagnosis present

## 2018-12-07 DIAGNOSIS — Z951 Presence of aortocoronary bypass graft: Secondary | ICD-10-CM | POA: Diagnosis not present

## 2018-12-07 DIAGNOSIS — I252 Old myocardial infarction: Secondary | ICD-10-CM | POA: Diagnosis not present

## 2018-12-07 DIAGNOSIS — R0789 Other chest pain: Secondary | ICD-10-CM

## 2018-12-07 DIAGNOSIS — Z91040 Latex allergy status: Secondary | ICD-10-CM | POA: Diagnosis not present

## 2018-12-07 DIAGNOSIS — I25810 Atherosclerosis of coronary artery bypass graft(s) without angina pectoris: Secondary | ICD-10-CM | POA: Diagnosis not present

## 2018-12-07 DIAGNOSIS — R7989 Other specified abnormal findings of blood chemistry: Secondary | ICD-10-CM | POA: Diagnosis not present

## 2018-12-07 DIAGNOSIS — I10 Essential (primary) hypertension: Secondary | ICD-10-CM | POA: Diagnosis not present

## 2018-12-07 DIAGNOSIS — R079 Chest pain, unspecified: Secondary | ICD-10-CM | POA: Diagnosis present

## 2018-12-07 DIAGNOSIS — E876 Hypokalemia: Secondary | ICD-10-CM | POA: Diagnosis present

## 2018-12-07 DIAGNOSIS — Z7902 Long term (current) use of antithrombotics/antiplatelets: Secondary | ICD-10-CM

## 2018-12-07 DIAGNOSIS — I248 Other forms of acute ischemic heart disease: Secondary | ICD-10-CM | POA: Diagnosis present

## 2018-12-07 DIAGNOSIS — I214 Non-ST elevation (NSTEMI) myocardial infarction: Secondary | ICD-10-CM | POA: Diagnosis present

## 2018-12-07 DIAGNOSIS — I2510 Atherosclerotic heart disease of native coronary artery without angina pectoris: Secondary | ICD-10-CM | POA: Diagnosis present

## 2018-12-07 LAB — CBC
HCT: 39.1 % (ref 39.0–52.0)
Hemoglobin: 12.8 g/dL — ABNORMAL LOW (ref 13.0–17.0)
MCH: 29.6 pg (ref 26.0–34.0)
MCHC: 32.7 g/dL (ref 30.0–36.0)
MCV: 90.5 fL (ref 80.0–100.0)
Platelets: 219 10*3/uL (ref 150–400)
RBC: 4.32 MIL/uL (ref 4.22–5.81)
RDW: 12.6 % (ref 11.5–15.5)
WBC: 6.4 10*3/uL (ref 4.0–10.5)
nRBC: 0 % (ref 0.0–0.2)

## 2018-12-07 LAB — BASIC METABOLIC PANEL
Anion gap: 7 (ref 5–15)
BUN: 19 mg/dL (ref 8–23)
CO2: 25 mmol/L (ref 22–32)
Calcium: 9.1 mg/dL (ref 8.9–10.3)
Chloride: 105 mmol/L (ref 98–111)
Creatinine, Ser: 0.86 mg/dL (ref 0.61–1.24)
GFR calc non Af Amer: >60 mL/min (ref >60)
Glucose, Bld: 186 mg/dL — ABNORMAL HIGH (ref 70–99)
Potassium: 3.6 mmol/L (ref 3.5–5.1)
Sodium: 137 mmol/L (ref 135–145)

## 2018-12-07 LAB — TROPONIN I
Troponin I: 0.04 ng/mL (ref <0.03)
Troponin I: 2.86 ng/mL (ref <0.03)

## 2018-12-07 LAB — PROTIME-INR
INR: 1.14
PROTHROMBIN TIME: 14.5 s (ref 11.4–15.2)

## 2018-12-07 LAB — APTT: APTT: 31 s (ref 24–36)

## 2018-12-07 MED ORDER — NITROGLYCERIN 0.4 MG SL SUBL: 0.4000 mg | SUBLINGUAL_TABLET | SUBLINGUAL | Status: DC | PRN

## 2018-12-07 MED ORDER — MORPHINE SULFATE (PF) 2 MG/ML IV SOLN: 2.0000 mg | INTRAVENOUS | Status: DC | PRN

## 2018-12-07 MED ORDER — EZETIMIBE 10 MG PO TABS
10.0000 mg | ORAL_TABLET | Freq: Every day | ORAL | Status: DC
  Administered 2018-12-08 – 2018-12-09 (×2): 10 mg via ORAL
  Filled 2018-12-07 (×2): qty 1

## 2018-12-07 MED ORDER — CLOPIDOGREL BISULFATE 75 MG PO TABS
75.0000 mg | ORAL_TABLET | Freq: Every day | ORAL | Status: DC
  Administered 2018-12-08 – 2018-12-09 (×2): 75 mg via ORAL
  Filled 2018-12-07 (×2): qty 1

## 2018-12-07 MED ORDER — HEPARIN BOLUS VIA INFUSION
4000.0000 [IU] | Freq: Once | INTRAVENOUS | Status: AC
  Administered 2018-12-07: 4000 [IU] via INTRAVENOUS
  Filled 2018-12-07: qty 4000

## 2018-12-07 MED ORDER — LEVOTHYROXINE SODIUM 25 MCG PO TABS
25.0000 ug | ORAL_TABLET | Freq: Every day | ORAL | Status: DC
  Administered 2018-12-08 – 2018-12-09 (×2): 25 ug via ORAL
  Filled 2018-12-07 (×2): qty 1

## 2018-12-07 MED ORDER — ALPRAZOLAM 0.25 MG PO TABS: 0.2500 mg | ORAL_TABLET | Freq: Two times a day (BID) | ORAL | Status: DC | PRN

## 2018-12-07 MED ORDER — AMLODIPINE BESYLATE 10 MG PO TABS
10.0000 mg | ORAL_TABLET | Freq: Every day | ORAL | Status: DC
  Administered 2018-12-07 – 2018-12-09 (×2): 10 mg via ORAL
  Filled 2018-12-07 (×3): qty 1

## 2018-12-07 MED ORDER — ATORVASTATIN CALCIUM 20 MG PO TABS
80.0000 mg | ORAL_TABLET | Freq: Every day | ORAL | Status: DC
  Administered 2018-12-07 – 2018-12-09 (×3): 80 mg via ORAL
  Filled 2018-12-07 (×3): qty 4

## 2018-12-07 MED ORDER — CO Q-10 100 MG PO CAPS: 100.0000 mg | ORAL_CAPSULE | Freq: Every day | ORAL | Status: DC

## 2018-12-07 MED ORDER — ONDANSETRON HCL 4 MG/2ML IJ SOLN: 4.0000 mg | Freq: Four times a day (QID) | INTRAMUSCULAR | Status: DC | PRN

## 2018-12-07 MED ORDER — PANTOPRAZOLE SODIUM 40 MG PO TBEC
40.0000 mg | DELAYED_RELEASE_TABLET | Freq: Every day | ORAL | Status: DC
  Administered 2018-12-07 – 2018-12-09 (×3): 40 mg via ORAL
  Filled 2018-12-07 (×3): qty 1

## 2018-12-07 MED ORDER — DOXAZOSIN MESYLATE 8 MG PO TABS
8.0000 mg | ORAL_TABLET | Freq: Two times a day (BID) | ORAL | Status: DC
  Administered 2018-12-07 – 2018-12-09 (×3): 8 mg via ORAL
  Filled 2018-12-07 (×5): qty 1

## 2018-12-07 MED ORDER — ALUM & MAG HYDROXIDE-SIMETH 200-200-20 MG/5ML PO SUSP
30.0000 mL | Freq: Once | ORAL | Status: AC
  Administered 2018-12-07: 30 mL via ORAL
  Filled 2018-12-07: qty 30

## 2018-12-07 MED ORDER — HYDROCHLOROTHIAZIDE 25 MG PO TABS: 25.0000 mg | ORAL_TABLET | Freq: Every day | ORAL | Status: DC

## 2018-12-07 MED ORDER — ENOXAPARIN SODIUM 40 MG/0.4ML ~~LOC~~ SOLN: 40.0000 mg | SUBCUTANEOUS | Status: DC

## 2018-12-07 MED ORDER — POLYETHYLENE GLYCOL 3350 17 G PO PACK
17.0000 g | PACK | Freq: Every day | ORAL | Status: DC
  Administered 2018-12-08 – 2018-12-09 (×2): 17 g via ORAL
  Filled 2018-12-07 (×2): qty 1

## 2018-12-07 MED ORDER — MELATONIN 5 MG PO TABS
5.0000 mg | ORAL_TABLET | Freq: Every day | ORAL | Status: DC
  Administered 2018-12-07 – 2018-12-08 (×2): 5 mg via ORAL
  Filled 2018-12-07 (×3): qty 1

## 2018-12-07 MED ORDER — ACETAMINOPHEN 325 MG PO TABS: 650.0000 mg | ORAL_TABLET | ORAL | Status: DC | PRN

## 2018-12-07 MED ORDER — ASPIRIN EC 81 MG PO TBEC: 81.0000 mg | DELAYED_RELEASE_TABLET | Freq: Every day | ORAL | Status: DC

## 2018-12-07 MED ORDER — HEPARIN (PORCINE) 25000 UT/250ML-% IV SOLN
950.0000 [IU]/h | INTRAVENOUS | Status: DC
  Administered 2018-12-07: 950 [IU]/h via INTRAVENOUS
  Filled 2018-12-07: qty 250

## 2018-12-07 MED ORDER — LOSARTAN POTASSIUM 25 MG PO TABS: 25.0000 mg | ORAL_TABLET | Freq: Every day | ORAL | Status: DC | PRN

## 2018-12-07 MED ORDER — MELATONIN-PYRIDOXINE 3-1 MG PO TABS: ORAL_TABLET | Freq: Every day | ORAL | Status: DC

## 2018-12-07 MED ORDER — VITAMIN D3 25 MCG (1000 UNIT) PO TABS
1000.0000 [IU] | ORAL_TABLET | Freq: Every day | ORAL | Status: DC
  Administered 2018-12-08 – 2018-12-09 (×2): 1000 [IU] via ORAL
  Filled 2018-12-07 (×2): qty 1

## 2018-12-07 NOTE — Progress Notes (Signed)
PHARMACIST - PHYSICIAN ORDER COMMUNICATION  CONCERNING: P&T Medication Policy on Herbal Medications  DESCRIPTION:  This patient's order for:  Co Q-10 capsules  has been noted.  This product(s) is classified as an "herbal" or natural product. Due to a lack of definitive safety studies or FDA approval, nonstandard manufacturing practices, plus the potential risk of unknown drug-drug interactions while on inpatient medications, the Pharmacy and Therapeutics Committee does not permit the use of "herbal" or natural products of this type within [facility].   ACTION TAKEN: The pharmacy department is unable to verify this order at this time and your patient has been informed of this safety policy. Please reevaluate patient's clinical condition at discharge and address if the herbal or natural product(s) should be resumed at that time.

## 2018-12-07 NOTE — ED Notes (Signed)
Called for dining tray.

## 2018-12-07 NOTE — ED Notes (Signed)
Lab called to verify blue top in lab to run aPTT and Protime-INR

## 2018-12-07 NOTE — ED Notes (Signed)
Heparin bolus and infusion verified with Gershon Mussel, RN at pt bedside.

## 2018-12-07 NOTE — H&P (Signed)
[facility] [place] NAME: Clifford Aguilar    MR#:  [id number]  DATE OF BIRTH:  1941/09/04  DATE OF ADMISSION:  12/07/2018  PRIMARY CARE PHYSICIAN: Carmon Ginsberg, PA   REQUESTING/REFERRING PHYSICIAN:   CHIEF COMPLAINT:   Chief Complaint  Patient presents with  . Chest Pain  . Shortness of Breath    HISTORY OF PRESENT ILLNESS: Clifford Aguilar  is a 77 y.o. male with a known history per below presents emergency room with acute mid chest pressure radiating into bilateral shoulders, associated with nausea, shortness of breath, in the emergency room patient was found to be mildly bradycardic and hypertensive, chest x-ray negative, EKG benign, patient evaluated emergency room, wife at the bedside, patient now be admitted for acute chest pain.  PAST MEDICAL HISTORY:   Past Medical History:  Diagnosis Date  . Cancer ([place])    basel cell carcinoma left ear  . Carotid artery occlusion    s/p right carotid endarterectomy  . Chronic kidney disease    renal artery stenosis  . Coronary artery disease    s/p coronary bypass graft surgery in 1992, stable  . GERD (gastroesophageal reflux disease)   . Hyperlipidemia   . Hypertension   . Myocardial infarction ([place])   . PVD (peripheral vascular disease) ([place])    s/p multiple revascularization procedures.  . Shortness of breath     PAST SURGICAL HISTORY:  Past Surgical History:  Procedure Laterality Date  . abdomial angiogram  03-16-2014  . ABDOMINAL ANGIOGRAM N/A 03/16/2014   Procedure: ABDOMINAL ANGIOGRAM;  Surgeon: Rosetta Posner, MD;  Location[facility] CATH LAB;  Service: Cardiovascular;  Laterality: N/A;  . ABDOMINAL AORTAGRAM N/A 12/03/2011   Procedure: ABDOMINAL AORTAGRAM;  Surgeon: Serafina Mitchell, MD;  Location: [facility] CATH LAB;  Service: Cardiovascular;  Laterality: N/A;  . CAROTID ENDARTERECTOMY     Right. With 46% stenosis in the right by recent Doppler  . COLONOSCOPY WITH PROPOFOL N/A 03/12/2016    Procedure: COLONOSCOPY WITH PROPOFOL;  Surgeon: [name], MD;  Location: [facility] ENDOSCOPY;  Service: Endoscopy;  Laterality: N/A;  . CORONARY ARTERY BYPASS GRAFT  1992  . left common iliac  12/03/2011  . Peripheral vascular revascularization     . RENAL ANGIOGRAM  03-16-2014  . RENAL ANGIOGRAM Left 12/03/2011   Procedure: RENAL ANGIOGRAM;  Surgeon: Serafina Mitchell, MD;  Location: [facility] CATH LAB;  Service: Cardiovascular;  Laterality: Left;  lt renal artery stent  . RENAL ANGIOGRAM N/A 03/16/2014   Procedure: RENAL ANGIOGRAM;  Surgeon: Rosetta Posner, MD;  Location: [facility] CATH LAB;  Service: Cardiovascular;  Laterality: N/A;  . renal artery stenosis  12/03/2011  . SHOULDER ARTHROSCOPY     Right Amnioplasty; Acromioclavicular joint resection and arthroscopic debridement  . Surgery for Sleep Apnea      SOCIAL HISTORY:  Social History   Tobacco Use  . Smoking status: Former Smoker    Types: Cigarettes    Last attempt to quit: 12/30/1990    Years since quitting: 27.9  . Smokeless tobacco: Former Systems developer    Quit date: 01/10/1991  . Tobacco comment: quit smoking in 1992  Substance Use Topics  . Alcohol use: Yes    Alcohol/week: 1.0 - 2.0 standard drinks    Types: 1 - 2 Glasses of wine per week    Comment: 1-2 a week    FAMILY HISTORY:  Family History  Problem Relation Age of Onset  . Stroke Mother   .  Heart disease Other   . Stroke Sister   . Stroke Brother     DRUG ALLERGIES:  Allergies  Allergen Reactions  . Hydralazine Rash  . Adhesive [Tape]     SKIN SENSITIVITY  . Benazepril     Nausea, vomiting and diarrhea  . Carvedilol     Blurred visino  . Clonidine Derivatives Other (See Comments)    Caused blindness in left eye for the days on the drug  . Latex Other (See Comments)  . Lisinopril Other (See Comments)    Blurred vision   . Losartan     Blurry vision if doses>25mg . Only use it for his BP is >170  . Trazodone Other (See Comments)    Cognitive impairment   . Hydrazine  Yellow [Tartrazine] Itching and Rash  . Isosorbide Dinitrate Rash  . Isosorbide Mononitrate Rash    REVIEW OF SYSTEMS:   CONSTITUTIONAL: No fever, fatigue or weakness.  EYES: No blurred or double vision.  EARS, NOSE, AND THROAT: No tinnitus or ear pain.  RESPIRATORY: No cough, +shortness of breath, no wheezing or hemoptysis.  CARDIOVASCULAR: + chest pain, no orthopnea, edema.  GASTROINTESTINAL: No nausea, vomiting, diarrhea or abdominal pain.  GENITOURINARY: No dysuria, hematuria.  ENDOCRINE: No polyuria, nocturia,  HEMATOLOGY: No anemia, easy bruising or bleeding SKIN: No rash or lesion. MUSCULOSKELETAL: No joint pain or arthritis.   NEUROLOGIC: No tingling, numbness, weakness.  PSYCHIATRY: No anxiety or depression.   MEDICATIONS AT HOME:  Prior to Admission medications   Medication Sig Start Date End Date Taking? Authorizing Provider  aspirin 81 MG tablet Take 81 mg by mouth daily.     [provider]  atorvastatin (LIPITOR) 80 MG tablet Take 1 tablet (80 mg total) by mouth daily. Patient taking differently: Take 40 mg by mouth daily.  04/01/18   Minna Merritts, MD  cholecalciferol (VITAMIN D) 1000 units tablet Take 1,000 Units by mouth daily.    [provider]  clopidogrel (PLAVIX) 75 MG tablet Take 1 tablet (75 mg total) by mouth daily. 03/31/18   Rosetta Posner, MD  Coenzyme Q10 (CO Q-10) 100 MG CAPS Take 100 mg by mouth daily.    [provider]  doxazosin (CARDURA) 8 MG tablet Take 1 tablet (8 mg total) by mouth 2 (two) times daily. 04/01/18   Minna Merritts, MD  ezetimibe (ZETIA) 10 MG tablet Take 1 tablet (10 mg total) by mouth daily. 10/01/17   Minna Merritts, MD  GARLIC PO Take 3,762 mg by mouth. Takes 7 tablets daily.    [provider]  hydrochlorothiazide (HYDRODIURIL) 25 MG tablet Take 1 tablet (25 mg total) by mouth daily. 04/01/18   Minna Merritts, MD  lansoprazole (PREVACID) 15 MG capsule Take 1 capsule (15 mg total) by mouth  daily at 12 noon. Patient taking differently: Take 30 mg by mouth daily at 12 noon.  01/06/14   Minna Merritts, MD  levothyroxine (SYNTHROID, LEVOTHROID) 25 MCG tablet Take 1 tablet (25 mcg total) by mouth daily before breakfast. 03/31/18   Carmon Ginsberg, PA  losartan (COZAAR) 25 MG tablet Take 1 tablet (25 mg total) by mouth daily as needed (for elevated BP). 10/01/17   Minna Merritts, MD  Melatonin-Pyridoxine (MELATIN PO) Take by mouth.    [provider]  nitroGLYCERIN (NITROLINGUAL) 0.4 MG/SPRAY spray Place 1 spray under the tongue every 5 (five) minutes x 3 doses as needed for chest pain. 10/14/17   Minna Merritts,  MD  polyethylene glycol (MIRALAX / GLYCOLAX) packet Take 17 g by mouth daily. 03/22/16   Carmon Ginsberg, PA  Zn-Pyg Afri-Nettle-Saw Palmet (SAW PALMETTO COMPLEX PO) Take 1,725 mg by mouth daily.     [provider]      PHYSICAL EXAMINATION:   VITAL SIGNS: Blood pressure (!) 155/84, pulse (!) 57, temperature 97.7 F (36.5 C), temperature source Oral, resp. rate 19, height 5\' 10"  (1.778 m), weight 81.6 kg, SpO2 96 %.  GENERAL:  77 y.o.-year-old patient lying in the bed with no acute distress.  Obese EYES: Pupils equal, round, reactive to light and accommodation. No scleral icterus. Extraocular muscles intact.  HEENT: Head atraumatic, normocephalic. Oropharynx and nasopharynx clear.  NECK:  Supple, no jugular venous distention. No thyroid enlargement, no tenderness.  LUNGS: Normal breath sounds bilaterally, no wheezing, rales,rhonchi or crepitation. No use of accessory muscles of respiration.  CARDIOVASCULAR: S1, S2 normal. No murmurs, rubs, or gallops.  ABDOMEN: Soft, nontender, nondistended. Bowel sounds present. No organomegaly or mass.  EXTREMITIES: No pedal edema, cyanosis, or clubbing.  NEUROLOGIC: Cranial nerves II through XII are intact. Muscle strength 5/5 in all extremities. Sensation intact. Gait not checked.  PSYCHIATRIC: The patient is alert  and oriented x 3.  SKIN: No obvious rash, lesion, or ulcer.   LABORATORY PANEL:   CBC Recent Labs  Lab 12/07/18 1547  WBC 6.4  HGB 12.8*  HCT 39.1  PLT 219  MCV 90.5  MCH 29.6  MCHC 32.7  RDW 12.6   ------------------------------------------------------------------------------------------------------------------  Chemistries  Recent Labs  Lab 12/07/18 1547  NA 137  K 3.6  CL 105  CO2 25  GLUCOSE 186*  BUN 19  CREATININE 0.86  CALCIUM 9.1   ------------------------------------------------------------------------------------------------------------------ estimated creatinine clearance is 76.6 mL/min (by C-G formula based on SCr of 0.86 mg/dL). ------------------------------------------------------------------------------------------------------------------ No results for input(s): TSH, T4TOTAL, T3FREE, THYROIDAB in the last 72 hours.  Invalid input(s): FREET3   Coagulation profile No results for input(s): INR, PROTIME in the last 168 hours. ------------------------------------------------------------------------------------------------------------------- No results for input(s): DDIMER in the last 72 hours. -------------------------------------------------------------------------------------------------------------------  Cardiac Enzymes Recent Labs  Lab 12/07/18 1547  TROPONINI 0.04*   ------------------------------------------------------------------------------------------------------------------ Invalid input(s): POCBNP  ---------------------------------------------------------------------------------------------------------------  Urinalysis    Component Value Date/Time   COLORURINE Yellow 01/12/2014 1647   APPEARANCEUR Clear 01/12/2014 1647   LABSPEC 1.012 01/12/2014 1647   PHURINE 7.0 01/12/2014 1647   GLUCOSEU Negative 01/12/2014 1647   HGBUR Negative 01/12/2014 1647   BILIRUBINUR Negative 01/12/2014 1647   KETONESUR Negative 01/12/2014 1647    PROTEINUR Negative 01/12/2014 1647   NITRITE Negative 01/12/2014 1647   LEUKOCYTESUR Negative 01/12/2014 1647     RADIOLOGY: Dg Chest 2 View  Result Date: 12/07/2018 CLINICAL DATA:  77 y/o  M; episode of chest pain. EXAM: CHEST - 2 VIEW COMPARISON:  02/18/2018 chest radiograph FINDINGS: Stable cardiac silhouette given projection and technique. Post median sternotomy and CABG with wires in alignment. Aortic atherosclerosis with calcification. Clear lungs. No pleural effusion or pneumothorax. No acute osseous abnormality is evident. Multilevel degenerative changes of thoracic spine. IMPRESSION: No acute pulmonary process identified. Electronically Signed   By: Kristine Garbe M.D.   On: 12/07/2018 16:23    EKG: Orders placed or performed during the hospital encounter of 12/07/18  . EKG 12-Lead  . EKG 12-Lead  . ED EKG within 10 minutes  . ED EKG within 10 minutes  . ED EKG  . ED EKG    IMPRESSION AND PLAN: *Acute chest pain *Chronic  coronary artery disease *Chronic GERD *Chronic hypertension *Acute mild asymptomatic bradycardia  Admit to observation unit on our chest pain protocol, rule out acute coronary syndrome with cardiac enzymes x3 sets, continue DAPT with aspirin/Plavix, statin therapy-check lipids in the morning, losartan, nitrates as needed, IV morphine PRN breakthrough pain, supplemental oxygen wean as tolerated, if rules out will proceed with nuclear medicine stress testing in the morning    All the records are reviewed and case discussed with ED provider. Management plans discussed with the patient, family and they are in agreement.  CODE STATUS:full Code Status History    Date Active Date Inactive Code Status Order ID Comments User Context   03/16/2014 1808 03/17/2014 1347 Full Code [id number]  Rosetta Posner, MD Inpatient       TOTAL TIME TAKING CARE OF THIS PATIENT: 40 minutes.    Avel Peace Salary M.D on 12/07/2018   Between 7am to 6pm - Pager -  [phone]  After 6pm go to [email or web address] - password [email or web address] [place] [facility]  Office  [phone]  CC: Primary care physician; Carmon Ginsberg, PA   Note: This dictation was prepared with Dragon dictation along with smaller phrase technology. Any transcriptional errors that result from this process are unintentional.

## 2018-12-07 NOTE — ED Notes (Signed)
Pt is given a dinner tray to eat.

## 2018-12-07 NOTE — ED Triage Notes (Signed)
Pt reports Cp that started 1.5 hours ago. States he took 2 nitro.

## 2018-12-07 NOTE — ED Notes (Signed)
Date and time results received: 12/07/18 1644   Test: troponin Critical Value: 0.04  Name of Provider Notified: Dr Cherylann Banas

## 2018-12-07 NOTE — ED Provider Notes (Signed)
[facility] Emergency Department Provider Note ____________________________________________   First MD Initiated Contact with Patient 12/07/18 1547     (approximate)  I have reviewed the triage vital signs and the nursing notes.   HISTORY  Chief Complaint Chest Pain and Shortness of Breath    HPI Clifford Aguilar is a 77 y.o. male with PMH as noted below who presents with chest pain, acute onset this afternoon around 2 PM, and now resolved.  It is described as pressure in his chest and radiating up to his bilateral shoulders.  It occurred right after he ate lunch.  The patient states that he felt nauseous but denies lightheadedness.  He reports that he had mild shortness of breath.  He states he feels completely back to normal now.  He reports having chest pain in 1993 when he had a heart attack but nothing since then.  Past Medical History:  Diagnosis Date  . Cancer ([place])    basel cell carcinoma left ear  . Carotid artery occlusion    s/p right carotid endarterectomy  . Chronic kidney disease    renal artery stenosis  . Coronary artery disease    s/p coronary bypass graft surgery in 1992, stable  . GERD (gastroesophageal reflux disease)   . Hyperlipidemia   . Hypertension   . Myocardial infarction ([place])   . PVD (peripheral vascular disease) ([place])    s/p multiple revascularization procedures.  . Shortness of breath     Patient Active Problem List   Diagnosis Date Noted  . Renal cyst, left 03/30/2018  . Personal history of colonic polyps   . Benign neoplasm of ascending colon   . Hypothyroidism 01/29/2016  . Insomnia 01/29/2016  . GERD (gastroesophageal reflux disease) 01/29/2016  . Hyperlipidemia 04/27/2014  . History of renal angiogram 03/16/2014  . Decreased libido 10/22/2013  . Benign prostatic hyperplasia with urinary obstruction 10/22/2013  . Renal artery stenosis ([place]) 07/27/2013  . Basal cell carcinoma of face 04/27/2013  . HTN  (hypertension) 09/05/2011  . CAD, AUTOLOGOUS BYPASS GRAFT 03/27/2009  . Carotid artery stenosis 03/27/2009  . Coronary atherosclerosis 03/25/2009  . PAD (peripheral artery disease) ([place]) 03/25/2009    Past Surgical History:  Procedure Laterality Date  . abdomial angiogram  03-16-2014  . ABDOMINAL ANGIOGRAM N/A 03/16/2014   Procedure: ABDOMINAL ANGIOGRAM;  Surgeon: Rosetta Posner, MD;  Location: [facility] CATH LAB;  Service: Cardiovascular;  Laterality: N/A;  . ABDOMINAL AORTAGRAM N/A 12/03/2011   Procedure: ABDOMINAL AORTAGRAM;  Surgeon: Serafina Mitchell, MD;  Location: [facility] CATH LAB;  Service: Cardiovascular;  Laterality: N/A;  . CAROTID ENDARTERECTOMY     Right. With 46% stenosis in the right by recent Doppler  . COLONOSCOPY WITH PROPOFOL N/A 03/12/2016   Procedure: COLONOSCOPY WITH PROPOFOL;  Surgeon: [name], MD;  Location: [facility] ENDOSCOPY;  Service: Endoscopy;  Laterality: N/A;  . CORONARY ARTERY BYPASS GRAFT  1992  . left common iliac  12/03/2011  . Peripheral vascular revascularization     . RENAL ANGIOGRAM  03-16-2014  . RENAL ANGIOGRAM Left 12/03/2011   Procedure: RENAL ANGIOGRAM;  Surgeon: Serafina Mitchell, MD;  Location: [facility] CATH LAB;  Service: Cardiovascular;  Laterality: Left;  lt renal artery stent  . RENAL ANGIOGRAM N/A 03/16/2014   Procedure: RENAL ANGIOGRAM;  Surgeon: Rosetta Posner, MD;  Location[facility] CATH LAB;  Service: Cardiovascular;  Laterality: N/A;  . renal artery stenosis  12/03/2011  . SHOULDER ARTHROSCOPY     Right Amnioplasty; Acromioclavicular  joint resection and arthroscopic debridement  . Surgery for Sleep Apnea      Prior to Admission medications   Medication Sig Start Date End Date Taking? Authorizing Provider  aspirin 81 MG tablet Take 81 mg by mouth daily.     [provider]  atorvastatin (LIPITOR) 80 MG tablet Take 1 tablet (80 mg total) by mouth daily. Patient taking differently: Take 40 mg by mouth daily.  04/01/18   Minna Merritts, MD    cholecalciferol (VITAMIN D) 1000 units tablet Take 1,000 Units by mouth daily.    [provider]  clopidogrel (PLAVIX) 75 MG tablet Take 1 tablet (75 mg total) by mouth daily. 03/31/18   Rosetta Posner, MD  Coenzyme Q10 (CO Q-10) 100 MG CAPS Take 100 mg by mouth daily.    [provider]  doxazosin (CARDURA) 8 MG tablet Take 1 tablet (8 mg total) by mouth 2 (two) times daily. 04/01/18   Minna Merritts, MD  ezetimibe (ZETIA) 10 MG tablet Take 1 tablet (10 mg total) by mouth daily. 10/01/17   Minna Merritts, MD  GARLIC PO Take 5,176 mg by mouth. Takes 7 tablets daily.    [provider]  hydrochlorothiazide (HYDRODIURIL) 25 MG tablet Take 1 tablet (25 mg total) by mouth daily. 04/01/18   Minna Merritts, MD  lansoprazole (PREVACID) 15 MG capsule Take 1 capsule (15 mg total) by mouth daily at 12 noon. Patient taking differently: Take 30 mg by mouth daily at 12 noon.  01/06/14   Minna Merritts, MD  levothyroxine (SYNTHROID, LEVOTHROID) 25 MCG tablet Take 1 tablet (25 mcg total) by mouth daily before breakfast. 03/31/18   Carmon Ginsberg, PA  losartan (COZAAR) 25 MG tablet Take 1 tablet (25 mg total) by mouth daily as needed (for elevated BP). 10/01/17   Minna Merritts, MD  Melatonin-Pyridoxine (MELATIN PO) Take by mouth.    [provider]  nitroGLYCERIN (NITROLINGUAL) 0.4 MG/SPRAY spray Place 1 spray under the tongue every 5 (five) minutes x 3 doses as needed for chest pain. 10/14/17   Minna Merritts, MD  polyethylene glycol (MIRALAX / GLYCOLAX) packet Take 17 g by mouth daily. 03/22/16   Carmon Ginsberg, PA  Zn-Pyg Afri-Nettle-Saw Palmet (SAW PALMETTO COMPLEX PO) Take 1,725 mg by mouth daily.     [provider]    Allergies Hydralazine; Adhesive [tape]; Benazepril; Carvedilol; Clonidine derivatives; Latex; Lisinopril; Losartan; Trazodone; Hydrazine yellow [tartrazine]; Isosorbide dinitrate; and Isosorbide mononitrate  Family History  Problem  Relation Age of Onset  . Stroke Mother   . Heart disease Other   . Stroke Sister   . Stroke Brother     Social History Social History   Tobacco Use  . Smoking status: Former Smoker    Types: Cigarettes    Last attempt to quit: 12/30/1990    Years since quitting: 27.9  . Smokeless tobacco: Former Systems developer    Quit date: 01/10/1991  . Tobacco comment: quit smoking in 1992  Substance Use Topics  . Alcohol use: Yes    Alcohol/week: 1.0 - 2.0 standard drinks    Types: 1 - 2 Glasses of wine per week    Comment: 1-2 a week  . Drug use: No    Review of Systems  Constitutional: No fever. Eyes: No redness. ENT: No neck pain. Cardiovascular: Positive for resolved chest pain. Respiratory: Positive for resolved shortness of breath. Gastrointestinal: Positive for resolved nausea. Genitourinary: Negative for flank pain.  Musculoskeletal: Negative  for back pain. Skin: Negative for rash. Neurological: Negative for headache.   ____________________________________________   PHYSICAL EXAM:  VITAL SIGNS: ED Triage Vitals [12/07/18 1532]  Enc Vitals Group     BP (!) 156/88     Pulse Rate 89     Resp 20     Temp 97.7 F (36.5 C)     Temp Source Oral     SpO2 96 %     Weight      Height      Head Circumference      Peak Flow      Pain Score 3     Pain Loc      Pain Edu?      Excl. in [place]?     Constitutional: Alert and oriented. Well appearing and in no acute distress. Eyes: Conjunctivae are normal.  Head: Atraumatic. Nose: No congestion/rhinnorhea. Mouth/Throat: Mucous membranes are moist.   Neck: Normal range of motion.  Cardiovascular: Normal rate, regular rhythm. Grossly normal heart sounds.  Good peripheral circulation. Respiratory: Normal respiratory effort.  No retractions. Lungs CTAB. Gastrointestinal: No distention.  Musculoskeletal: No lower extremity edema.  Extremities warm and well perfused.  Neurologic:  Normal speech and language. No gross focal neurologic  deficits are appreciated.  Skin:  Skin is warm and dry. No rash noted. Psychiatric: Mood and affect are normal. Speech and behavior are normal.  ____________________________________________   LABS (all labs ordered are listed, but only abnormal results are displayed)  Labs Reviewed  BASIC METABOLIC PANEL - Abnormal; Notable for the following components:      Result Value   Glucose, Bld 186 (*)    All other components within normal limits  CBC - Abnormal; Notable for the following components:   Hemoglobin 12.8 (*)    All other components within normal limits  TROPONIN I - Abnormal; Notable for the following components:   Troponin I 0.04 (*)    All other components within normal limits   ____________________________________________  EKG  ED ECG REPORT I, Arta Silence, the attending physician, personally viewed and interpreted this ECG.  Date: 12/07/2018 EKG Time: 1526 Rate: 113 Rhythm: Atrial fibrillation with RVR and some PVCs QRS Axis: normal Intervals: normal ST/T Wave abnormalities: Lateral nonspecific ST depression Narrative Interpretation: Atrial fibrillation with RVR and nonspecific ST changes   ED ECG REPORT I, Arta Silence, the attending physician, personally viewed and interpreted this ECG.  Date: 12/07/2018 EKG Time: 1609 Rate: 67 Rhythm: normal sinus rhythm QRS Axis: normal Intervals: normal ST/T Wave abnormalities: normal Narrative Interpretation: no evidence of acute ischemia   ____________________________________________  RADIOLOGY  CXR: No focal infiltrate, edema, or other acute findings  ____________________________________________   PROCEDURES  Procedure(s) performed: No  Procedures  Critical Care performed:  No ____________________________________________   INITIAL IMPRESSION / ASSESSMENT AND PLAN / ED COURSE  Pertinent labs & imaging results that were available during my care of the patient were reviewed by me and  considered in my medical decision making (see chart for details).  77 year old male with PMH as noted above but no prior history of atrial fibrillation presents with acute onset of chest discomfort this afternoon after he ate lunch, radiating to bilateral shoulders, and associated with some shortness of breath and nausea.  He states that after arriving in the ED, the symptoms completely resolved.  On exam, the patient is well-appearing and his vital signs are normal.  His exam is otherwise unremarkable.  His initial EKG, when he was symptomatic, showed  rapid atrial fibrillation with some lateral ST abnormalities.  However, by the time that I saw him he was in normal sinus rhythm and repeat EKG at that time was normal.  I reviewed the past medical records in Epic; the patient has no prior history of atrial fibrillation.  It does appear that he went into an episode of atrial fibrillation which likely precipitated his symptoms.  We will obtain chest x-ray, basic labs, troponin, and then I will discuss with cardiology.  ----------------------------------------- 6:18 PM on 12/07/2018 -----------------------------------------  The patient's troponin is minimally elevated.  The lab work-up is otherwise unremarkable.  He has remained asymptomatic.  I consulted Dr. Fletcher Anon from cardiology.  He recommends placing the patient on a heparin drip and admitting for ACS rule out, given the new atrial fibrillation.  I discussed this with the patient and his wife and they agree with the plan.  I signed the patient out to the hospitalist.  ____________________________________________   FINAL CLINICAL IMPRESSION(S) / ED DIAGNOSES  Final diagnoses:  Paroxysmal atrial fibrillation ([place])  Atypical chest pain      NEW MEDICATIONS STARTED DURING THIS VISIT:  New Prescriptions   No medications on file     Note:  This document was prepared using Dragon voice recognition software and may include  unintentional dictation errors.    Arta Silence, MD 12/07/18 1819

## 2018-12-07 NOTE — Progress Notes (Signed)
Family Meeting Note  Advance Directive:yes  Today a meeting took place with the Patient.  Patient is able to participate   The following clinical team members were present during this meeting:MD  The following were discussed:Patient's diagnosis:cp , Patient's progosis: Unable to determine and Goals for treatment: Full Code  Additional follow-up to be provided: prn  Time spent during discussion:20 minutes  Gorden Harms, MD

## 2018-12-07 NOTE — ED Notes (Signed)
Report was called and given to the floor at this time 

## 2018-12-07 NOTE — ED Notes (Signed)
Patient transported to X-ray 

## 2018-12-07 NOTE — Progress Notes (Signed)
CRITICAL VALUE ALERT  Critical Value:  Troponin 2.86  Date & Time Notied:  12/07/2018 2149  Provider Notified: Duane [place]  Orders Received/Actions taken: Continue to monitor.

## 2018-12-07 NOTE — Progress Notes (Signed)
ANTICOAGULATION CONSULT NOTE - Initial Consult  Pharmacy Consult for Heparin Indication: chest pain/ACS  Allergies  Allergen Reactions  . Hydralazine Rash  . Adhesive [Tape]     SKIN SENSITIVITY  . Benazepril     Nausea, vomiting and diarrhea  . Carvedilol     Blurred visino  . Clonidine Derivatives Other (See Comments)    Caused blindness in left eye for the days on the drug  . Latex Other (See Comments)  . Lisinopril Other (See Comments)    Blurred vision   . Losartan     Blurry vision if doses>25mg . Only use it for his BP is >170  . Trazodone Other (See Comments)    Cognitive impairment   . Hydrazine Yellow [Tartrazine] Itching and Rash  . Isosorbide Dinitrate Rash  . Isosorbide Mononitrate Rash    Patient Measurements:   Heparin Dosing Weight: 81.6 kg  Vital Signs: Temp: 97.7 F (36.5 C) (12/09 1532) Temp Source: Oral (12/09 1532) BP: 139/80 (12/09 1643) Pulse Rate: 72 (12/09 1643)  Labs: Recent Labs    12/07/18 1547  HGB 12.8*  HCT 39.1  PLT 219  CREATININE 0.86  TROPONINI 0.04*    CrCl cannot be calculated (Unknown ideal weight.).   Medical History: Past Medical History:  Diagnosis Date  . Cancer ([place])    basel cell carcinoma left ear  . Carotid artery occlusion    s/p right carotid endarterectomy  . Chronic kidney disease    renal artery stenosis  . Coronary artery disease    s/p coronary bypass graft surgery in 1992, stable  . GERD (gastroesophageal reflux disease)   . Hyperlipidemia   . Hypertension   . Myocardial infarction ([place])   . PVD (peripheral vascular disease) ([place])    s/p multiple revascularization procedures.  . Shortness of breath     Medications:  Scheduled:  Infusions:  PRN:   Assessment: 77 year old male presents to the ED with chest pain and SOB. Trop 0.04. Pharmacy consulted for heparin gtt dosing.   Goal of Therapy:  Heparin level 0.3-0.7 units/ml Monitor platelets by anticoagulation protocol: Yes   Plan:   Give 4000 units bolus x 1 Start heparin infusion at 950 units/hr Check anti-Xa level in 6 hours and daily while on heparin Continue to monitor H&H and platelets  Oswald Hillock, PharmD, BCPS 12/07/2018,5:40 PM

## 2018-12-08 ENCOUNTER — Inpatient Hospital Stay (HOSPITAL_COMMUNITY)
Admit: 2018-12-08 | Discharge: 2018-12-08 | Disposition: A | Discharge status: Home / Self Care | Payer: PPO | Attending: Family Medicine | Admitting: Family Medicine

## 2018-12-08 ENCOUNTER — Encounter
Admission: EM | Disposition: A | Discharge status: Home / Self Care | Payer: Self-pay | Source: Home / Self Care | Attending: Internal Medicine

## 2018-12-08 DIAGNOSIS — I739 Peripheral vascular disease, unspecified: Secondary | ICD-10-CM

## 2018-12-08 DIAGNOSIS — Z7982 Long term (current) use of aspirin: Secondary | ICD-10-CM | POA: Diagnosis not present

## 2018-12-08 DIAGNOSIS — K219 Gastro-esophageal reflux disease without esophagitis: Secondary | ICD-10-CM | POA: Diagnosis present

## 2018-12-08 DIAGNOSIS — I25810 Atherosclerosis of coronary artery bypass graft(s) without angina pectoris: Secondary | ICD-10-CM

## 2018-12-08 DIAGNOSIS — Z885 Allergy status to narcotic agent status: Secondary | ICD-10-CM | POA: Diagnosis not present

## 2018-12-08 DIAGNOSIS — Z87891 Personal history of nicotine dependence: Secondary | ICD-10-CM | POA: Diagnosis not present

## 2018-12-08 DIAGNOSIS — R0789 Other chest pain: Secondary | ICD-10-CM | POA: Diagnosis not present

## 2018-12-08 DIAGNOSIS — Z888 Allergy status to other drugs, medicaments and biological substances status: Secondary | ICD-10-CM | POA: Diagnosis not present

## 2018-12-08 DIAGNOSIS — E876 Hypokalemia: Secondary | ICD-10-CM | POA: Diagnosis present

## 2018-12-08 DIAGNOSIS — E785 Hyperlipidemia, unspecified: Secondary | ICD-10-CM | POA: Diagnosis present

## 2018-12-08 DIAGNOSIS — N400 Enlarged prostate without lower urinary tract symptoms: Secondary | ICD-10-CM | POA: Diagnosis present

## 2018-12-08 DIAGNOSIS — R079 Chest pain, unspecified: Secondary | ICD-10-CM | POA: Diagnosis present

## 2018-12-08 DIAGNOSIS — Z951 Presence of aortocoronary bypass graft: Secondary | ICD-10-CM | POA: Diagnosis not present

## 2018-12-08 DIAGNOSIS — Z7902 Long term (current) use of antithrombotics/antiplatelets: Secondary | ICD-10-CM | POA: Diagnosis not present

## 2018-12-08 DIAGNOSIS — N189 Chronic kidney disease, unspecified: Secondary | ICD-10-CM | POA: Diagnosis present

## 2018-12-08 DIAGNOSIS — I214 Non-ST elevation (NSTEMI) myocardial infarction: Secondary | ICD-10-CM

## 2018-12-08 DIAGNOSIS — E039 Hypothyroidism, unspecified: Secondary | ICD-10-CM | POA: Diagnosis present

## 2018-12-08 DIAGNOSIS — I252 Old myocardial infarction: Secondary | ICD-10-CM | POA: Diagnosis not present

## 2018-12-08 DIAGNOSIS — Z91040 Latex allergy status: Secondary | ICD-10-CM | POA: Diagnosis not present

## 2018-12-08 DIAGNOSIS — Z823 Family history of stroke: Secondary | ICD-10-CM | POA: Diagnosis not present

## 2018-12-08 DIAGNOSIS — I2582 Chronic total occlusion of coronary artery: Secondary | ICD-10-CM | POA: Diagnosis present

## 2018-12-08 DIAGNOSIS — I248 Other forms of acute ischemic heart disease: Secondary | ICD-10-CM | POA: Diagnosis present

## 2018-12-08 DIAGNOSIS — I480 Paroxysmal atrial fibrillation: Secondary | ICD-10-CM

## 2018-12-08 DIAGNOSIS — I129 Hypertensive chronic kidney disease with stage 1 through stage 4 chronic kidney disease, or unspecified chronic kidney disease: Secondary | ICD-10-CM | POA: Diagnosis present

## 2018-12-08 DIAGNOSIS — I2510 Atherosclerotic heart disease of native coronary artery without angina pectoris: Secondary | ICD-10-CM | POA: Diagnosis present

## 2018-12-08 DIAGNOSIS — Z79890 Hormone replacement therapy (postmenopausal): Secondary | ICD-10-CM | POA: Diagnosis not present

## 2018-12-08 HISTORY — PX: LEFT HEART CATH AND CORS/GRAFTS ANGIOGRAPHY: CATH118250

## 2018-12-08 LAB — LIPID PANEL
Cholesterol: 122 mg/dL (ref 0–200)
HDL: 38 mg/dL — ABNORMAL LOW (ref >40)
LDL Cholesterol: 69 mg/dL (ref 0–99)
TRIGLYCERIDES: 73 mg/dL (ref <150)
Total CHOL/HDL Ratio: 3.2 RATIO
VLDL: 15 mg/dL (ref 0–40)

## 2018-12-08 LAB — ECHOCARDIOGRAM COMPLETE
Height: 69 in
Weight: 2881.6 oz

## 2018-12-08 LAB — HEMOGLOBIN A1C
Hgb A1c MFr Bld: 5.8 % — ABNORMAL HIGH (ref 4.8–5.6)
Mean Plasma Glucose: 119.76 mg/dL

## 2018-12-08 LAB — TROPONIN I
TROPONIN I: 4.16 ng/mL — AB (ref <0.03)
Troponin I: 4.14 ng/mL (ref <0.03)

## 2018-12-08 LAB — HEPARIN LEVEL (UNFRACTIONATED)
Heparin Unfractionated: 0.47 IU/mL (ref 0.30–0.70)
Heparin Unfractionated: 0.47 IU/mL (ref 0.30–0.70)

## 2018-12-08 SURGERY — LEFT HEART CATH AND CORS/GRAFTS ANGIOGRAPHY
Anesthesia: Moderate Sedation | Laterality: Left

## 2018-12-08 MED ORDER — VERAPAMIL HCL 2.5 MG/ML IV SOLN
INTRAVENOUS | Status: AC
  Filled 2018-12-08: qty 2

## 2018-12-08 MED ORDER — IOPAMIDOL (ISOVUE-300) INJECTION 61%
INTRAVENOUS | Status: DC | PRN
  Administered 2018-12-08: 135 mL via INTRA_ARTERIAL

## 2018-12-08 MED ORDER — SODIUM CHLORIDE 0.9 % IV SOLN: INTRAVENOUS | Status: AC

## 2018-12-08 MED ORDER — FENTANYL CITRATE (PF) 100 MCG/2ML IJ SOLN
INTRAMUSCULAR | Status: AC
  Filled 2018-12-08: qty 2

## 2018-12-08 MED ORDER — HEPARIN (PORCINE) IN NACL 1000-0.9 UT/500ML-% IV SOLN
INTRAVENOUS | Status: AC
  Filled 2018-12-08: qty 1000

## 2018-12-08 MED ORDER — SODIUM CHLORIDE 0.9 % WEIGHT BASED INFUSION
3.0000 mL/kg/h | INTRAVENOUS | Status: DC
  Administered 2018-12-08: 3 mL/kg/h via INTRAVENOUS

## 2018-12-08 MED ORDER — SODIUM CHLORIDE 0.9% FLUSH: 3.0000 mL | Freq: Two times a day (BID) | INTRAVENOUS | Status: DC

## 2018-12-08 MED ORDER — SODIUM CHLORIDE 0.9% FLUSH: 3.0000 mL | INTRAVENOUS | Status: DC | PRN

## 2018-12-08 MED ORDER — HYDROCHLOROTHIAZIDE 25 MG PO TABS
25.0000 mg | ORAL_TABLET | Freq: Every day | ORAL | Status: DC
  Administered 2018-12-09: 25 mg via ORAL
  Filled 2018-12-08: qty 1

## 2018-12-08 MED ORDER — MIDAZOLAM HCL 2 MG/2ML IJ SOLN
INTRAMUSCULAR | Status: DC | PRN
  Administered 2018-12-08 (×2): 1 mg via INTRAVENOUS

## 2018-12-08 MED ORDER — SODIUM CHLORIDE 0.9 % WEIGHT BASED INFUSION: 1.0000 mL/kg/h | INTRAVENOUS | Status: DC

## 2018-12-08 MED ORDER — SODIUM CHLORIDE 0.9% FLUSH
3.0000 mL | Freq: Two times a day (BID) | INTRAVENOUS | Status: DC
  Administered 2018-12-09: 3 mL via INTRAVENOUS

## 2018-12-08 MED ORDER — HEPARIN SODIUM (PORCINE) 1000 UNIT/ML IJ SOLN
INTRAMUSCULAR | Status: AC
  Filled 2018-12-08: qty 1

## 2018-12-08 MED ORDER — HEPARIN SODIUM (PORCINE) 1000 UNIT/ML IJ SOLN
INTRAMUSCULAR | Status: DC | PRN
  Administered 2018-12-08: 4000 [IU] via INTRAVENOUS

## 2018-12-08 MED ORDER — MIDAZOLAM HCL 2 MG/2ML IJ SOLN
INTRAMUSCULAR | Status: AC
  Filled 2018-12-08: qty 2

## 2018-12-08 MED ORDER — SODIUM CHLORIDE 0.9 % IV SOLN: 250.0000 mL | INTRAVENOUS | Status: DC | PRN

## 2018-12-08 MED ORDER — VERAPAMIL HCL 2.5 MG/ML IV SOLN
INTRAVENOUS | Status: DC | PRN
  Administered 2018-12-08 (×2): 2.5 mg via INTRA_ARTERIAL

## 2018-12-08 MED ORDER — ASPIRIN 81 MG PO CHEW: 81.0000 mg | CHEWABLE_TABLET | ORAL | Status: DC

## 2018-12-08 MED ORDER — HEPARIN (PORCINE) 25000 UT/250ML-% IV SOLN
950.0000 [IU]/h | INTRAVENOUS | Status: DC
  Administered 2018-12-08: 950 [IU]/h via INTRAVENOUS
  Filled 2018-12-08: qty 250

## 2018-12-08 MED ORDER — FENTANYL CITRATE (PF) 100 MCG/2ML IJ SOLN
INTRAMUSCULAR | Status: DC | PRN
  Administered 2018-12-08 (×2): 25 ug via INTRAVENOUS

## 2018-12-08 SURGICAL SUPPLY — 13 items
CATH INFINITI 5 FR IM (CATHETERS) ×1 IMPLANT
CATH INFINITI 5FR AL1 (CATHETERS) ×1 IMPLANT
CATH INFINITI 5FR ANG PIGTAIL (CATHETERS) ×1 IMPLANT
CATH INFINITI 5FR JL4 (CATHETERS) ×1 IMPLANT
CATH INFINITI JR4 5F (CATHETERS) ×1 IMPLANT
DEVICE RAD TR BAND REGULAR (VASCULAR PRODUCTS) ×2 IMPLANT
GLIDESHEATH SLEND SS 6F .021 (SHEATH) ×2 IMPLANT
GUIDEWIRE ANGLED .035X260CM (WIRE) ×1 IMPLANT
KIT MANI 3VAL PERCEP (MISCELLANEOUS) ×2 IMPLANT
PACK CARDIAC CATH (CUSTOM PROCEDURE TRAY) ×2 IMPLANT
SHIELD RADPAD SCOOP 12X17 (MISCELLANEOUS) ×1 IMPLANT
WIRE HITORQ VERSACORE ST 145CM (WIRE) ×1 IMPLANT
WIRE ROSEN-J .035X260CM (WIRE) ×2 IMPLANT

## 2018-12-08 NOTE — Progress Notes (Signed)
*  PRELIMINARY RESULTS* Echocardiogram 2D Echocardiogram has been performed.  Clifford Aguilar 12/08/2018, 1:32 PM

## 2018-12-08 NOTE — Progress Notes (Signed)
ANTICOAGULATION CONSULT NOTE - Initial Consult  Pharmacy Consult for Heparin Indication: chest pain/ACS  Allergies  Allergen Reactions  . Hydralazine Rash  . Adhesive [Tape]     SKIN SENSITIVITY  . Benazepril     Nausea, vomiting and diarrhea  . Carvedilol     Blurred visino  . Clonidine Derivatives Other (See Comments)    Caused blindness in left eye for the days on the drug  . Latex Other (See Comments)  . Lisinopril Other (See Comments)    Blurred vision   . Losartan     Blurry vision if doses>25mg . Only use it for his BP is >170  . Trazodone Other (See Comments)    Cognitive impairment   . Hydrazine Yellow [Tartrazine] Itching and Rash  . Isosorbide Dinitrate Rash  . Isosorbide Mononitrate Rash    Patient Measurements: Height: 5\' 9"  (175.3 cm) Weight: 180 lb 1.6 oz (81.7 kg) IBW/kg (Calculated) : 70.7 Heparin Dosing Weight: 81.6 kg  Vital Signs: Temp: 98.1 F (36.7 C) (12/10 1618) Temp Source: Oral (12/10 1618) BP: 118/53 (12/10 1800) Pulse Rate: 48 (12/10 1800)  Labs: Recent Labs    12/07/18 1547 12/07/18 2055 12/08/18 0037 12/08/18 0309 12/08/18 0812  HGB 12.8*  --   --   --   --   HCT 39.1  --   --   --   --   PLT 219  --   --   --   --   APTT 31  --   --   --   --   LABPROT 14.5  --   --   --   --   INR 1.14  --   --   --   --   HEPARINUNFRC  --   --  0.47  --  0.47  CREATININE 0.86  --   --   --   --   TROPONINI 0.04* 2.86* 4.16* 4.14*  --     Estimated Creatinine Clearance: 74.2 mL/min (by C-G formula based on SCr of 0.86 mg/dL).   Medical History: Past Medical History:  Diagnosis Date  . Cancer ([place])    basel cell carcinoma left ear  . Carotid artery occlusion    s/p right carotid endarterectomy  . Chronic kidney disease    renal artery stenosis  . Coronary artery disease    s/p coronary bypass graft surgery in 1992, stable  . GERD (gastroesophageal reflux disease)   . Hyperlipidemia   . Hypertension   . Myocardial infarction  ([place])   . PVD (peripheral vascular disease) ([place])    s/p multiple revascularization procedures.  . Shortness of breath     Medications:  Scheduled:  Infusions:  PRN:   Assessment: 77 year old male presents to the ED with chest pain and SOB. Trop 0.04. Pharmacy consulted for heparin gtt dosing.   12/10 0030 HL 0.47  Cont at 950 units/hr 12/10 0800 HL 0.47  Cont at 950 units/hr  Patient went to Cath lab, MD order to resume Heparin 8 hours post sheath removal. RN reported sheath removal at 15:30.  Goal of Therapy:  Heparin level 0.3-0.7 units/ml Monitor platelets by anticoagulation protocol: Yes   Plan:  Will resume Heparin 950 untis/hr at 23:30. Next Heparin level in 8 hours after restart. Daily CBC while on Heparin drip.  Paulina Fusi, PharmD, BCPS 12/08/2018 7:37 PM

## 2018-12-08 NOTE — H&P (View-Only) (Signed)
Cardiology Consultation:   Patient ID: Clifford Aguilar MRN: [id number]; DOB: [birth year]  Admit date: 12/07/2018 Date of Consult: 12/08/2018  Primary Care Provider: Carmon Ginsberg, [place] Primary Cardiologist: Dr. Rockey Situ Primary Electrophysiologist:  None    Patient Profile:   Clifford Aguilar is a 77 y.o. male with a hx of paroxysmal atrial fibrillation, PVD with L iliac stent, HTN, HLD, CAD s/p 1992 CABG x4, CKD with renal artery stenosis and L renal angioplasty (12/03/2011) and stenting, carotid artery disease s/p R carotid endarterectomy with residual 60-70% residual disease and chronically L occluded carotid artery, hypothyroid, and previous history of smoking who is being seen today for the evaluation of chest pain at the request of Dr. Jerelyn Charles.  History of Present Illness:   Clifford Aguilar is a 76 yo male with complicated PMH as above and including severe vascular disease with several procedures performed on both left and right leg for reperfusion.  He is followed in [place] for stable bilateral carotid artery disease and peripheral vascular disease.  Per Dr. Donivan Scull last note, patient does have a history of irregular heart rate.  At the time of his last appointment, the medical plan was to continue medical management for atherosclerosis and bilateral carotid artery stenosis, PAD, HTN, HLD.  12/07/18: He reported CP that started ~1.5 hours before presentation to [facility] ED (~2PM)  and s/p 2 nitro.  The pain was described as starting shortly after he ate lunch and was described as pressure in his chest that radiated up to his bilateral shoulders and down both arms. No associated SOB, presyncope, palpitations, or feeling of racing heart rate. Of note, he also mentioned eating cookies the previous night, which caused significant GI distress. He denied any symptoms of presyncope.  He reports having similar chest pain back in 1993, when he had a heart attack and 4 grafts.  In the ED, Vitals: BP  156/88, HR 89, RR 20, T 97.34F, SPO2 96% Labs: Glucose 186, hemoglobin 12.8, troponin 0.04 EKG: Per documentation in the ED, initial EKG when symptomatic showed atrial fibrillation with some lateral ST abnormalities.  Documentation notes that he was in NSR with repeat EKG when seen in the ED AND NSR, 67 bpm, no acute changes CXR: No acute findings Meds: Started on heparin bolus and infusion  While still in the ED, Dr. Fletcher Anon was consulted and recommended the patient be placed on heparin drip and admitted for ACS rule out given the new atrial fibrillation.  Repeat troponin 2.86  4.16  4.14.  Refused CPAP. Repeat EKGs show IVCD.  Pending updated EKG before cardiac catheterization. H/o allergy intolerances as listed below in A/P.   Consented for cardiac catheterization today 12/08/18. The procedure with Risks/Benefits/Alternatives and Indications was reviewed with the patient.  All questions were answered.  Risks / Complications include, but not limited to: Death, MI, CVA/TIA, VF/VT (with defibrillation), Bradycardia (need for temporary pacer placement), contrast induced nephropathy, bleeding / bruising / hematoma / pseudoaneurysm, radiation-related injury in the case of prolonged fluoroscopy use, vascular or coronary injury (with possible emergent CT or Vascular Surgery), adverse medication reactions, infection.  The patient understands the risks of serious complication is 1-2 in 9485 with diagnostic cardiac cath and 1-2% or less with angioplasty/stenting. The patient (and wife) voice understanding and agree to proceed.    Past Medical History:  Diagnosis Date  . Cancer ([place])    basel cell carcinoma left ear  . Carotid artery occlusion    s/p right carotid endarterectomy  .  Chronic kidney disease    renal artery stenosis  . Coronary artery disease    s/p coronary bypass graft surgery in 1992, stable  . GERD (gastroesophageal reflux disease)   . Hyperlipidemia   . Hypertension   . Myocardial  infarction ([place])   . PVD (peripheral vascular disease) ([place])    s/p multiple revascularization procedures.  . Shortness of breath     Past Surgical History:  Procedure Laterality Date  . abdomial angiogram  03-16-2014  . ABDOMINAL ANGIOGRAM N/A 03/16/2014   Procedure: ABDOMINAL ANGIOGRAM;  Surgeon: Rosetta Posner, MD;  Location: [facility] CATH LAB;  Service: Cardiovascular;  Laterality: N/A;  . ABDOMINAL AORTAGRAM N/A 12/03/2011   Procedure: ABDOMINAL AORTAGRAM;  Surgeon: Serafina Mitchell, MD;  Location: [facility] CATH LAB;  Service: Cardiovascular;  Laterality: N/A;  . CAROTID ENDARTERECTOMY     Right. With 46% stenosis in the right by recent Doppler  . COLONOSCOPY WITH PROPOFOL N/A 03/12/2016   Procedure: COLONOSCOPY WITH PROPOFOL;  Surgeon: [name], MD;  Location: [facility] ENDOSCOPY;  Service: Endoscopy;  Laterality: N/A;  . CORONARY ARTERY BYPASS GRAFT  1992  . left common iliac  12/03/2011  . Peripheral vascular revascularization     . RENAL ANGIOGRAM  03-16-2014  . RENAL ANGIOGRAM Left 12/03/2011   Procedure: RENAL ANGIOGRAM;  Surgeon: Serafina Mitchell, MD;  Location: [facility] CATH LAB;  Service: Cardiovascular;  Laterality: Left;  lt renal artery stent  . RENAL ANGIOGRAM N/A 03/16/2014   Procedure: RENAL ANGIOGRAM;  Surgeon: Rosetta Posner, MD;  Location: [facility] CATH LAB;  Service: Cardiovascular;  Laterality: N/A;  . renal artery stenosis  12/03/2011  . SHOULDER ARTHROSCOPY     Right Amnioplasty; Acromioclavicular joint resection and arthroscopic debridement  . Surgery for Sleep Apnea       Home Medications:  Prior to Admission medications   Medication Sig Start Date End Date Taking? Authorizing Provider  aspirin 81 MG tablet Take 81 mg by mouth daily.    Yes [provider]  atorvastatin (LIPITOR) 80 MG tablet Take 1 tablet (80 mg total) by mouth daily. Patient taking differently: Take 40 mg by mouth daily.  04/01/18  Yes Minna Merritts, MD  cholecalciferol (VITAMIN D) 1000 units tablet Take  1,000 Units by mouth daily.   Yes [provider]  clopidogrel (PLAVIX) 75 MG tablet Take 1 tablet (75 mg total) by mouth daily. 03/31/18  Yes Early, Arvilla Meres, MD  Coenzyme Q10 (CO Q-10) 100 MG CAPS Take 100 mg by mouth daily.   Yes [provider]  doxazosin (CARDURA) 8 MG tablet Take 1 tablet (8 mg total) by mouth 2 (two) times daily. 04/01/18  Yes Minna Merritts, MD  ezetimibe (ZETIA) 10 MG tablet Take 1 tablet (10 mg total) by mouth daily. 10/01/17  Yes Minna Merritts, MD  Garlic 9735 MG CAPS Take 7,000 mg by mouth.    Yes [provider]  hydrochlorothiazide (HYDRODIURIL) 25 MG tablet Take 1 tablet (25 mg total) by mouth daily. 04/01/18  Yes Minna Merritts, MD  lansoprazole (PREVACID) 15 MG capsule Take 1 capsule (15 mg total) by mouth daily at 12 noon. Patient taking differently: Take 30 mg by mouth daily at 12 noon.  01/06/14  Yes Gollan, Kathlene November, MD  levothyroxine (SYNTHROID, LEVOTHROID) 25 MCG tablet Take 1 tablet (25 mcg total) by mouth daily before breakfast. 03/31/18  Yes Carmon Ginsberg, PA  losartan (COZAAR) 25 MG tablet Take 1 tablet (25 mg total)  by mouth daily as needed (for elevated BP). 10/01/17  Yes Gollan, Kathlene November, MD  Melatonin-Pyridoxine (MELATIN PO) Take 3 tablets by mouth at bedtime as needed (insomnia).    Yes [provider]  nitroGLYCERIN (NITROLINGUAL) 0.4 MG/SPRAY spray Place 1 spray under the tongue every 5 (five) minutes x 3 doses as needed for chest pain. 10/14/17  Yes Gollan, Kathlene November, MD  polyethylene glycol (MIRALAX / GLYCOLAX) packet Take 17 g by mouth daily. 03/22/16  Yes Carmon Ginsberg, PA  Zn-Pyg Afri-Nettle-Saw Palmet (SAW PALMETTO COMPLEX PO) Take 1,725 mg by mouth daily.    Yes [provider]    Inpatient Medications: Scheduled Meds: . amLODipine  10 mg Oral Daily  . aspirin EC  81 mg Oral Daily  . atorvastatin  80 mg Oral Daily  . cholecalciferol  1,000 Units Oral Daily  . clopidogrel  75 mg Oral Daily    . doxazosin  8 mg Oral BID  . ezetimibe  10 mg Oral Daily  . hydrochlorothiazide  25 mg Oral Daily  . levothyroxine  25 mcg Oral Q0600  . Melatonin  5 mg Oral QHS  . pantoprazole  40 mg Oral Daily  . polyethylene glycol  17 g Oral Daily   Continuous Infusions: . heparin 950 Units/hr (12/07/18 1847)   PRN Meds: acetaminophen, ALPRAZolam, losartan, morphine injection, nitroGLYCERIN, ondansetron (ZOFRAN) IV  Allergies:    Allergies  Allergen Reactions  . Hydralazine Rash  . Adhesive [Tape]     SKIN SENSITIVITY  . Benazepril     Nausea, vomiting and diarrhea  . Carvedilol     Blurred visino  . Clonidine Derivatives Other (See Comments)    Caused blindness in left eye for the days on the drug  . Latex Other (See Comments)  . Lisinopril Other (See Comments)    Blurred vision   . Losartan     Blurry vision if doses>25mg . Only use it for his BP is >170  . Trazodone Other (See Comments)    Cognitive impairment   . Hydrazine Yellow [Tartrazine] Itching and Rash  . Isosorbide Dinitrate Rash  . Isosorbide Mononitrate Rash    Social History:   Social History   Socioeconomic History  . Marital status: Married    Spouse name: Not on file  . Number of children: Not on file  . Years of education: Not on file  . Highest education level: Not on file  Occupational History  . Not on file  Social Needs  . Financial resource strain: Not on file  . Food insecurity:    Worry: Not on file    Inability: Not on file  . Transportation needs:    Medical: Not on file    Non-medical: Not on file  Tobacco Use  . Smoking status: Former Smoker    Types: Cigarettes    Last attempt to quit: 12/30/1990    Years since quitting: 27.9  . Smokeless tobacco: Former Systems developer    Quit date: 01/10/1991  . Tobacco comment: quit smoking in 1992  Substance and Sexual Activity  . Alcohol use: Yes    Alcohol/week: 1.0 - 2.0 standard drinks    Types: 1 - 2 Glasses of wine per week    Comment: 1-2 a week   . Drug use: No  . Sexual activity: Yes  Lifestyle  . Physical activity:    Days per week: Not on file    Minutes per session: Not on file  . Stress: Not on file  Relationships  . Social connections:    Talks on phone: Not on file    Gets together: Not on file    Attends religious service: Not on file    Active member of club or organization: Not on file    Attends meetings of clubs or organizations: Not on file    Relationship status: Not on file  . Intimate partner violence:    Fear of current or ex partner: Not on file    Emotionally abused: Not on file    Physically abused: Not on file    Forced sexual activity: Not on file  Other Topics Concern  . Not on file  Social History Narrative  . Not on file    Family History:    Family History  Problem Relation Age of Onset  . Stroke Mother   . Heart disease Other   . Stroke Sister   . Stroke Brother      ROS:  Please see the history of present illness.  Review of Systems  Constitutional: Negative for chills, fever, malaise/fatigue and weight loss.  Cardiovascular: Positive for chest pain. Negative for palpitations, orthopnea, leg swelling and PND.  Gastrointestinal: Positive for nausea. Negative for abdominal pain, heartburn and vomiting.  Musculoskeletal: Negative for falls.  Neurological: Negative for headaches.    All other ROS reviewed and negative.     Physical Exam/Data:   Vitals:   12/07/18 1949 12/07/18 2214 12/08/18 0500 12/08/18 0515  BP: (!) 178/87 (!) 152/46  (!) 167/59  Pulse: 73 (!) 53  (!) 56  Resp: 16 18  16   Temp:  98.2 F (36.8 C)  98.3 F (36.8 C)  TempSrc:  Oral  Oral  SpO2: 96% 99%  98%  Weight:  82.4 kg 81.7 kg   Height:  5\' 9"  (1.753 m)     No intake or output data in the 24 hours ending 12/08/18 0727 Filed Weights   12/07/18 1754 12/07/18 2214 12/08/18 0500  Weight: 81.6 kg 82.4 kg 81.7 kg   Body mass index is 26.6 kg/m.  General:  Well nourished, well developed, in no acute  distress. Accompanied by his wife HEENT: normal Neck: no JVD Vascular: No carotid bruits; FA pulses 2+ bilaterally without bruits  Cardiac:  normal S1, S2; RRR; no murmur  Lungs:  clear to auscultation bilaterally, no wheezing, rhonchi or rales  Abd: soft, nontender, no hepatomegaly  Ext: no edema Musculoskeletal:  No deformities, BUE and BLE strength normal and equal Skin: warm and dry - ecchymosis / skin changes bilaterally and distended leg veins Neuro:  no focal abnormalities noted Psych:  Normal affect   EKG:  The EKG was personally reviewed and demonstrates:  NSR with ectopy as above in HPI (initial EKG in atrial fibrillation and pending repeat EKG) Telemetry:  Telemetry was personally reviewed and demonstrates:  NSR with ectopy HR 40s-50s  Relevant CV Studies:  Please refer to the scanned in images under CV procedures  Laboratory Data:  Chemistry Recent Labs  Lab 12/07/18 1547  NA 137  K 3.6  CL 105  CO2 25  GLUCOSE 186*  BUN 19  CREATININE 0.86  CALCIUM 9.1  GFRNONAA >60  GFRAA >60  ANIONGAP 7    No results for input(s): PROT, ALBUMIN, AST, ALT, ALKPHOS, BILITOT in the last 168 hours. Hematology Recent Labs  Lab 12/07/18 1547  WBC 6.4  RBC 4.32  HGB 12.8*  HCT 39.1  MCV 90.5  MCH 29.6  MCHC 32.7  RDW 12.6  PLT 219   Cardiac Enzymes Recent Labs  Lab 12/07/18 1547 12/07/18 2055 12/08/18 0037 12/08/18 0309  TROPONINI 0.04* 2.86* 4.16* 4.14*   No results for input(s): TROPIPOC in the last 168 hours.  BNPNo results for input(s): BNP, PROBNP in the last 168 hours.  DDimer No results for input(s): DDIMER in the last 168 hours.  Radiology/Studies:  Dg Chest 2 View  Result Date: 12/07/2018 CLINICAL DATA:  77 y/o  M; episode of chest pain. EXAM: CHEST - 2 VIEW COMPARISON:  02/18/2018 chest radiograph FINDINGS: Stable cardiac silhouette given projection and technique. Post median sternotomy and CABG with wires in alignment. Aortic atherosclerosis with  calcification. Clear lungs. No pleural effusion or pneumothorax. No acute osseous abnormality is evident. Multilevel degenerative changes of thoracic spine. IMPRESSION: No acute pulmonary process identified. Electronically Signed   By: Kristine Garbe M.D.   On: 12/07/2018 16:23    Assessment and Plan:   1. NSTEMI s/p h/o CABG x4 - Currently CP free - Troponin 0.04  2.86  4.16  4.14 - Initial EKG in Atrial Fibrillation but without acute changes - pending pre cardiac catheterization EKG. Consider checking A1C, TSH given h/o thyroid disease.  -Started on ASA and heparin infusion. Daily CBC.  - H/o CABG ~1992. Will work on obtaining records regarding these grafts.  - Cardiac catheterization scheduled and patient consented as above. Additional plans for medical management following cardiac catheterization. Continue medical management as below following cardiac catheterization with nitro as needed for any reported chest pain  2. Atrial Fibrillation - CHA2DS2VASc score of at least 4 (HTN, agex2, vascular disease).  - Will need long term anticoagulation to be started after cardiac catheterization. - Currently on ASA and plavix 75mg  daily (for carotid artery disease and extensive PVD, followed in [place]). Will likely discontinue ASA before starting long term anticoagulation.   3. HTN - Slightly antihypertensive at present - Continue antihypertensives following cardiac catheterization and uptitrate as HR and BP allow. - Patient and wife indicated past history of ineffectiveness with beta blockers and nausea with ACE inhibitors. If able, attempt to transition to more guideline directed therapy following catheterization with beta blocker following cardiac catheterization and given NSTEMI. Patient is on Losartan 25mg , given nausea with ACEi. Continue following cardiac catheterization.   4. HLD - Continue aggressive statin therapy following cardiac catheretization with atorvastatin 80mg  daily.  Also on Zetia 10mg  daily.  5. CKD with h/o renal stenting - Renal function stable with Cr 0.86., K 3.6 - Consider checking magnesium - Daily BMET  6. Hypokalemia - Following cardiac cath, replete K with goal 4.0 - Check Mg  7. PVD and Carotid Artery Occlusion - Currently on ASA and Plavix with plan to transition from ASA to anticoagulation most likely with Eliquis following catheterization and given h/o Afib with CHA2DS2VASc score of at least  4 as above. Will plan for anticoagulation and plavix and to discontinue asa following cath.  8. Hypothyroid - Consider checking TSH      For questions or updates, please contact [place] Please consult [email or web address] for contact info under     Signed, Arvil Chaco, PA-C  12/08/2018 7:27 AM

## 2018-12-08 NOTE — Progress Notes (Signed)
ANTICOAGULATION CONSULT NOTE - Initial Consult  Pharmacy Consult for Heparin Indication: chest pain/ACS  Allergies  Allergen Reactions  . Hydralazine Rash  . Adhesive [Tape]     SKIN SENSITIVITY  . Benazepril     Nausea, vomiting and diarrhea  . Carvedilol     Blurred visino  . Clonidine Derivatives Other (See Comments)    Caused blindness in left eye for the days on the drug  . Latex Other (See Comments)  . Lisinopril Other (See Comments)    Blurred vision   . Losartan     Blurry vision if doses>25mg . Only use it for his BP is >170  . Trazodone Other (See Comments)    Cognitive impairment   . Hydrazine Yellow [Tartrazine] Itching and Rash  . Isosorbide Dinitrate Rash  . Isosorbide Mononitrate Rash    Patient Measurements: Height: 5\' 9"  (175.3 cm) Weight: 180 lb 1.6 oz (81.7 kg) IBW/kg (Calculated) : 70.7 Heparin Dosing Weight: 81.6 kg  Vital Signs: Temp: 97.9 F (36.6 C) (12/10 0908) Temp Source: Oral (12/10 0908) BP: 157/54 (12/10 0908) Pulse Rate: 51 (12/10 0908)  Labs: Recent Labs    12/07/18 1547 12/07/18 2055 12/08/18 0037 12/08/18 0309 12/08/18 0812  HGB 12.8*  --   --   --   --   HCT 39.1  --   --   --   --   PLT 219  --   --   --   --   APTT 31  --   --   --   --   LABPROT 14.5  --   --   --   --   INR 1.14  --   --   --   --   HEPARINUNFRC  --   --  0.47  --  0.47  CREATININE 0.86  --   --   --   --   TROPONINI 0.04* 2.86* 4.16* 4.14*  --     Estimated Creatinine Clearance: 74.2 mL/min (by C-G formula based on SCr of 0.86 mg/dL).   Medical History: Past Medical History:  Diagnosis Date  . Cancer ([place])    basel cell carcinoma left ear  . Carotid artery occlusion    s/p right carotid endarterectomy  . Chronic kidney disease    renal artery stenosis  . Coronary artery disease    s/p coronary bypass graft surgery in 1992, stable  . GERD (gastroesophageal reflux disease)   . Hyperlipidemia   . Hypertension   . Myocardial infarction  ([place])   . PVD (peripheral vascular disease) ([place])    s/p multiple revascularization procedures.  . Shortness of breath     Medications:  Scheduled:  Infusions:  PRN:   Assessment: 77 year old male presents to the ED with chest pain and SOB. Trop 0.04. Pharmacy consulted for heparin gtt dosing.   12/10 0030 HL 0.47  Cont at 950 units/hr 12/10 0800 HL 0.47  Cont at 950 units/hr  Goal of Therapy:  Heparin level 0.3-0.7 units/ml Monitor platelets by anticoagulation protocol: Yes   Plan:  12/10 @ 0800 HL 0.47 therapeutic. Will continue current rate and will recheck HL with AM labs. Daily CBC while on Heparin drip.  Paulina Fusi, PharmD, BCPS 12/08/2018 9:15 AM

## 2018-12-08 NOTE — Brief Op Note (Signed)
BRIEF CARDIAC CATHETERIZATION NOTE  12/08/2018  12:07 PM  PATIENT:  Clifford Aguilar  77 y.o. male  PRE-OPERATIVE DIAGNOSIS:  Non ST Elevation Myocardial Infarction  POST-OPERATIVE DIAGNOSIS:  Same  PROCEDURE:  Procedure(s): LEFT HEART CATH AND CORS/GRAFTS ANGIOGRAPHY (Left)  SURGEON:  Surgeon(s) and Role:    Nelva Bush, MD - Primary  FINDINGS: 1. Severe native CAD, including diffusely disease proximal LAD up to 80% and CTO's of ostial LCx and ostial RCA. 2. Patent LIMA->LAD, RIMI->RCA, and SVG->diag.  Patient reportedly had CABG x 4; question if jump portion of SVG to an OM is occluded. 3. Mildly elevated LVEDP with hyperdynamic LVEF. 4. PSVT noted during catheterization.  RECOMMENDATIONS: 1. Continue medical therapy. 2. Restart heparin 8 hours after sheaths are removed, given a-fib on admission. 3. Consider discontinuation of ASA and addition of NOAC tomorrow if no evidence of bleeding or vascular injury. 4. Continue current medications for antianginal therapy; could add ranolazine if recurrent chest pain.  NSTEMI may have represented demand ischemia in the setting of fixed CAD and a-fib with RVR. 5. Potential d/c home as early as tomorrow.  Nelva Bush, MD [facility] Pager: [phone]

## 2018-12-08 NOTE — Interval H&P Note (Signed)
History and Physical Interval Note:  12/08/2018 10:59 AM  [name]  has presented today for cardiac catheterization, with the diagnosis of Non ST Elevation Myocardial Infarction  The various methods of treatment have been discussed with the patient and family. After consideration of risks, benefits and other options for treatment, the patient has consented to  Procedure(s): LEFT HEART CATH AND CORS/GRAFTS ANGIOGRAPHY (Left) as a surgical intervention .  The patient's history has been reviewed, patient examined, no change in status, stable for surgery.  I have reviewed the patient's chart and labs.  Questions were answered to the patient's satisfaction.    Cath Lab Visit (complete for each Cath Lab visit)  Clinical Evaluation Leading to the Procedure:   ACS: Yes.    Non-ACS:  N/A  Clifford Aguilar

## 2018-12-08 NOTE — Consult Note (Signed)
Cardiology Consultation:   Patient ID: Clifford Aguilar MRN: [id number]; DOB: 08/16/1941  Admit date: 12/07/2018 Date of Consult: 12/08/2018  Primary Care Provider: Carmon Ginsberg, [place] Primary Cardiologist: Dr. Rockey Situ Primary Electrophysiologist:  None    Patient Profile:   Clifford Aguilar is a 77 y.o. male with a hx of paroxysmal atrial fibrillation, PVD with L iliac stent, HTN, HLD, CAD s/p 1992 CABG x4, CKD with renal artery stenosis and L renal angioplasty (12/03/2011) and stenting, carotid artery disease s/p R carotid endarterectomy with residual 60-70% residual disease and chronically L occluded carotid artery, hypothyroid, and previous history of smoking who is being seen today for the evaluation of chest pain at the request of Dr. Jerelyn Charles.  History of Present Illness:   Clifford Aguilar is a 77 yo male with complicated PMH as above and including severe vascular disease with several procedures performed on both left and right leg for reperfusion.  He is followed in [place] for stable bilateral carotid artery disease and peripheral vascular disease.  Per Dr. Donivan Scull last note, patient does have a history of irregular heart rate.  At the time of his last appointment, the medical plan was to continue medical management for atherosclerosis and bilateral carotid artery stenosis, PAD, HTN, HLD.  12/07/18: He reported CP that started ~1.5 hours before presentation to [facility] ED (~2PM)  and s/p 2 nitro.  The pain was described as starting shortly after he ate lunch and was described as pressure in his chest that radiated up to his bilateral shoulders and down both arms. No associated SOB, presyncope, palpitations, or feeling of racing heart rate. Of note, he also mentioned eating cookies the previous night, which caused significant GI distress. He denied any symptoms of presyncope.  He reports having similar chest pain back in 1993, when he had a heart attack and 4 grafts.  In the ED, Vitals: BP  156/88, HR 89, RR 20, T 97.4F, SPO2 96% Labs: Glucose 186, hemoglobin 12.8, troponin 0.04 EKG: Per documentation in the ED, initial EKG when symptomatic showed atrial fibrillation with some lateral ST abnormalities.  Documentation notes that he was in NSR with repeat EKG when seen in the ED AND NSR, 67 bpm, no acute changes CXR: No acute findings Meds: Started on heparin bolus and infusion  While still in the ED, Dr. Fletcher Anon was consulted and recommended the patient be placed on heparin drip and admitted for ACS rule out given the new atrial fibrillation.  Repeat troponin 2.86  4.16  4.14.  Refused CPAP. Repeat EKGs show IVCD.  Pending updated EKG before cardiac catheterization. H/o allergy intolerances as listed below in A/P.   Consented for cardiac catheterization today 12/08/18. The procedure with Risks/Benefits/Alternatives and Indications was reviewed with the patient.  All questions were answered.  Risks / Complications include, but not limited to: Death, MI, CVA/TIA, VF/VT (with defibrillation), Bradycardia (need for temporary pacer placement), contrast induced nephropathy, bleeding / bruising / hematoma / pseudoaneurysm, radiation-related injury in the case of prolonged fluoroscopy use, vascular or coronary injury (with possible emergent CT or Vascular Surgery), adverse medication reactions, infection.  The patient understands the risks of serious complication is 1-2 in 4401 with diagnostic cardiac cath and 1-2% or less with angioplasty/stenting. The patient (and wife) voice understanding and agree to proceed.    Past Medical History:  Diagnosis Date  . Cancer ([place])    basel cell carcinoma left ear  . Carotid artery occlusion    s/p right carotid endarterectomy  .  Chronic kidney disease    renal artery stenosis  . Coronary artery disease    s/p coronary bypass graft surgery in 1992, stable  . GERD (gastroesophageal reflux disease)   . Hyperlipidemia   . Hypertension   . Myocardial  infarction ([place])   . PVD (peripheral vascular disease) ([place])    s/p multiple revascularization procedures.  . Shortness of breath     Past Surgical History:  Procedure Laterality Date  . abdomial angiogram  03-16-2014  . ABDOMINAL ANGIOGRAM N/A 03/16/2014   Procedure: ABDOMINAL ANGIOGRAM;  Surgeon: Rosetta Posner, MD;  Location: [facility] CATH LAB;  Service: Cardiovascular;  Laterality: N/A;  . ABDOMINAL AORTAGRAM N/A 12/03/2011   Procedure: ABDOMINAL AORTAGRAM;  Surgeon: Serafina Mitchell, MD;  Location[facility] CATH LAB;  Service: Cardiovascular;  Laterality: N/A;  . CAROTID ENDARTERECTOMY     Right. With 46% stenosis in the right by recent Doppler  . COLONOSCOPY WITH PROPOFOL N/A 03/12/2016   Procedure: COLONOSCOPY WITH PROPOFOL;  Surgeon: [name], MD;  Location: [facility] ENDOSCOPY;  Service: Endoscopy;  Laterality: N/A;  . CORONARY ARTERY BYPASS GRAFT  1992  . left common iliac  12/03/2011  . Peripheral vascular revascularization     . RENAL ANGIOGRAM  03-16-2014  . RENAL ANGIOGRAM Left 12/03/2011   Procedure: RENAL ANGIOGRAM;  Surgeon: Serafina Mitchell, MD;  Location: [facility] CATH LAB;  Service: Cardiovascular;  Laterality: Left;  lt renal artery stent  . RENAL ANGIOGRAM N/A 03/16/2014   Procedure: RENAL ANGIOGRAM;  Surgeon: Rosetta Posner, MD;  Location: [facility] CATH LAB;  Service: Cardiovascular;  Laterality: N/A;  . renal artery stenosis  12/03/2011  . SHOULDER ARTHROSCOPY     Right Amnioplasty; Acromioclavicular joint resection and arthroscopic debridement  . Surgery for Sleep Apnea       Home Medications:  Prior to Admission medications   Medication Sig Start Date End Date Taking? Authorizing Provider  aspirin 81 MG tablet Take 81 mg by mouth daily.    Yes [provider]  atorvastatin (LIPITOR) 80 MG tablet Take 1 tablet (80 mg total) by mouth daily. Patient taking differently: Take 40 mg by mouth daily.  04/01/18  Yes Minna Merritts, MD  cholecalciferol (VITAMIN D) 1000 units tablet Take  1,000 Units by mouth daily.   Yes [provider]  clopidogrel (PLAVIX) 75 MG tablet Take 1 tablet (75 mg total) by mouth daily. 03/31/18  Yes Early, Arvilla Meres, MD  Coenzyme Q10 (CO Q-10) 100 MG CAPS Take 100 mg by mouth daily.   Yes [provider]  doxazosin (CARDURA) 8 MG tablet Take 1 tablet (8 mg total) by mouth 2 (two) times daily. 04/01/18  Yes Minna Merritts, MD  ezetimibe (ZETIA) 10 MG tablet Take 1 tablet (10 mg total) by mouth daily. 10/01/17  Yes Minna Merritts, MD  Garlic 3016 MG CAPS Take 7,000 mg by mouth.    Yes [provider]  hydrochlorothiazide (HYDRODIURIL) 25 MG tablet Take 1 tablet (25 mg total) by mouth daily. 04/01/18  Yes Minna Merritts, MD  lansoprazole (PREVACID) 15 MG capsule Take 1 capsule (15 mg total) by mouth daily at 12 noon. Patient taking differently: Take 30 mg by mouth daily at 12 noon.  01/06/14  Yes Gollan, Kathlene November, MD  levothyroxine (SYNTHROID, LEVOTHROID) 25 MCG tablet Take 1 tablet (25 mcg total) by mouth daily before breakfast. 03/31/18  Yes Carmon Ginsberg, PA  losartan (COZAAR) 25 MG tablet Take 1 tablet (25 mg total)  by mouth daily as needed (for elevated BP). 10/01/17  Yes Gollan, Kathlene November, MD  Melatonin-Pyridoxine (MELATIN PO) Take 3 tablets by mouth at bedtime as needed (insomnia).    Yes [provider]  nitroGLYCERIN (NITROLINGUAL) 0.4 MG/SPRAY spray Place 1 spray under the tongue every 5 (five) minutes x 3 doses as needed for chest pain. 10/14/17  Yes Gollan, Kathlene November, MD  polyethylene glycol (MIRALAX / GLYCOLAX) packet Take 17 g by mouth daily. 03/22/16  Yes Carmon Ginsberg, PA  Zn-Pyg Afri-Nettle-Saw Palmet (SAW PALMETTO COMPLEX PO) Take 1,725 mg by mouth daily.    Yes [provider]    Inpatient Medications: Scheduled Meds: . amLODipine  10 mg Oral Daily  . aspirin EC  81 mg Oral Daily  . atorvastatin  80 mg Oral Daily  . cholecalciferol  1,000 Units Oral Daily  . clopidogrel  75 mg Oral Daily    . doxazosin  8 mg Oral BID  . ezetimibe  10 mg Oral Daily  . hydrochlorothiazide  25 mg Oral Daily  . levothyroxine  25 mcg Oral Q0600  . Melatonin  5 mg Oral QHS  . pantoprazole  40 mg Oral Daily  . polyethylene glycol  17 g Oral Daily   Continuous Infusions: . heparin 950 Units/hr (12/07/18 1847)   PRN Meds: acetaminophen, ALPRAZolam, losartan, morphine injection, nitroGLYCERIN, ondansetron (ZOFRAN) IV  Allergies:    Allergies  Allergen Reactions  . Hydralazine Rash  . Adhesive [Tape]     SKIN SENSITIVITY  . Benazepril     Nausea, vomiting and diarrhea  . Carvedilol     Blurred visino  . Clonidine Derivatives Other (See Comments)    Caused blindness in left eye for the days on the drug  . Latex Other (See Comments)  . Lisinopril Other (See Comments)    Blurred vision   . Losartan     Blurry vision if doses>25mg . Only use it for his BP is >170  . Trazodone Other (See Comments)    Cognitive impairment   . Hydrazine Yellow [Tartrazine] Itching and Rash  . Isosorbide Dinitrate Rash  . Isosorbide Mononitrate Rash    Social History:   Social History   Socioeconomic History  . Marital status: Married    Spouse name: Not on file  . Number of children: Not on file  . Years of education: Not on file  . Highest education level: Not on file  Occupational History  . Not on file  Social Needs  . Financial resource strain: Not on file  . Food insecurity:    Worry: Not on file    Inability: Not on file  . Transportation needs:    Medical: Not on file    Non-medical: Not on file  Tobacco Use  . Smoking status: Former Smoker    Types: Cigarettes    Last attempt to quit: 12/30/1990    Years since quitting: 27.9  . Smokeless tobacco: Former Systems developer    Quit date: 01/10/1991  . Tobacco comment: quit smoking in 1992  Substance and Sexual Activity  . Alcohol use: Yes    Alcohol/week: 1.0 - 2.0 standard drinks    Types: 1 - 2 Glasses of wine per week    Comment: 1-2 a week   . Drug use: No  . Sexual activity: Yes  Lifestyle  . Physical activity:    Days per week: Not on file    Minutes per session: Not on file  . Stress: Not on file  Relationships  . Social connections:    Talks on phone: Not on file    Gets together: Not on file    Attends religious service: Not on file    Active member of club or organization: Not on file    Attends meetings of clubs or organizations: Not on file    Relationship status: Not on file  . Intimate partner violence:    Fear of current or ex partner: Not on file    Emotionally abused: Not on file    Physically abused: Not on file    Forced sexual activity: Not on file  Other Topics Concern  . Not on file  Social History Narrative  . Not on file    Family History:    Family History  Problem Relation Age of Onset  . Stroke Mother   . Heart disease Other   . Stroke Sister   . Stroke Brother      ROS:  Please see the history of present illness.  Review of Systems  Constitutional: Negative for chills, fever, malaise/fatigue and weight loss.  Cardiovascular: Positive for chest pain. Negative for palpitations, orthopnea, leg swelling and PND.  Gastrointestinal: Positive for nausea. Negative for abdominal pain, heartburn and vomiting.  Musculoskeletal: Negative for falls.  Neurological: Negative for headaches.    All other ROS reviewed and negative.     Physical Exam/Data:   Vitals:   12/07/18 1949 12/07/18 2214 12/08/18 0500 12/08/18 0515  BP: (!) 178/87 (!) 152/46  (!) 167/59  Pulse: 73 (!) 53  (!) 56  Resp: 16 18  16   Temp:  98.2 F (36.8 C)  98.3 F (36.8 C)  TempSrc:  Oral  Oral  SpO2: 96% 99%  98%  Weight:  82.4 kg 81.7 kg   Height:  5\' 9"  (1.753 m)     No intake or output data in the 24 hours ending 12/08/18 0727 Filed Weights   12/07/18 1754 12/07/18 2214 12/08/18 0500  Weight: 81.6 kg 82.4 kg 81.7 kg   Body mass index is 26.6 kg/m.  General:  Well nourished, well developed, in no acute  distress. Accompanied by his wife HEENT: normal Neck: no JVD Vascular: No carotid bruits; FA pulses 2+ bilaterally without bruits  Cardiac:  normal S1, S2; RRR; no murmur  Lungs:  clear to auscultation bilaterally, no wheezing, rhonchi or rales  Abd: soft, nontender, no hepatomegaly  Ext: no edema Musculoskeletal:  No deformities, BUE and BLE strength normal and equal Skin: warm and dry - ecchymosis / skin changes bilaterally and distended leg veins Neuro:  no focal abnormalities noted Psych:  Normal affect   EKG:  The EKG was personally reviewed and demonstrates:  NSR with ectopy as above in HPI (initial EKG in atrial fibrillation and pending repeat EKG) Telemetry:  Telemetry was personally reviewed and demonstrates:  NSR with ectopy HR 40s-50s  Relevant CV Studies:  Please refer to the scanned in images under CV procedures  Laboratory Data:  Chemistry Recent Labs  Lab 12/07/18 1547  NA 137  K 3.6  CL 105  CO2 25  GLUCOSE 186*  BUN 19  CREATININE 0.86  CALCIUM 9.1  GFRNONAA >60  GFRAA >60  ANIONGAP 7    No results for input(s): PROT, ALBUMIN, AST, ALT, ALKPHOS, BILITOT in the last 168 hours. Hematology Recent Labs  Lab 12/07/18 1547  WBC 6.4  RBC 4.32  HGB 12.8*  HCT 39.1  MCV 90.5  MCH 29.6  MCHC 32.7  RDW 12.6  PLT 219   Cardiac Enzymes Recent Labs  Lab 12/07/18 1547 12/07/18 2055 12/08/18 0037 12/08/18 0309  TROPONINI 0.04* 2.86* 4.16* 4.14*   No results for input(s): TROPIPOC in the last 168 hours.  BNPNo results for input(s): BNP, PROBNP in the last 168 hours.  DDimer No results for input(s): DDIMER in the last 168 hours.  Radiology/Studies:  Dg Chest 2 View  Result Date: 12/07/2018 CLINICAL DATA:  77 y/o  M; episode of chest pain. EXAM: CHEST - 2 VIEW COMPARISON:  02/18/2018 chest radiograph FINDINGS: Stable cardiac silhouette given projection and technique. Post median sternotomy and CABG with wires in alignment. Aortic atherosclerosis with  calcification. Clear lungs. No pleural effusion or pneumothorax. No acute osseous abnormality is evident. Multilevel degenerative changes of thoracic spine. IMPRESSION: No acute pulmonary process identified. Electronically Signed   By: Kristine Garbe M.D.   On: 12/07/2018 16:23    Assessment and Plan:   1. NSTEMI s/p h/o CABG x4 - Currently CP free - Troponin 0.04  2.86  4.16  4.14 - Initial EKG in Atrial Fibrillation but without acute changes - pending pre cardiac catheterization EKG. Consider checking A1C, TSH given h/o thyroid disease.  -Started on ASA and heparin infusion. Daily CBC.  - H/o CABG ~1992. Will work on obtaining records regarding these grafts.  - Cardiac catheterization scheduled and patient consented as above. Additional plans for medical management following cardiac catheterization. Continue medical management as below following cardiac catheterization with nitro as needed for any reported chest pain  2. Atrial Fibrillation - CHA2DS2VASc score of at least 4 (HTN, agex2, vascular disease).  - Will need long term anticoagulation to be started after cardiac catheterization. - Currently on ASA and plavix 75mg  daily (for carotid artery disease and extensive PVD, followed in [place]). Will likely discontinue ASA before starting long term anticoagulation.   3. HTN - Slightly antihypertensive at present - Continue antihypertensives following cardiac catheterization and uptitrate as HR and BP allow. - Patient and wife indicated past history of ineffectiveness with beta blockers and nausea with ACE inhibitors. If able, attempt to transition to more guideline directed therapy following catheterization with beta blocker following cardiac catheterization and given NSTEMI. Patient is on Losartan 25mg , given nausea with ACEi. Continue following cardiac catheterization.   4. HLD - Continue aggressive statin therapy following cardiac catheretization with atorvastatin 80mg  daily.  Also on Zetia 10mg  daily.  5. CKD with h/o renal stenting - Renal function stable with Cr 0.86., K 3.6 - Consider checking magnesium - Daily BMET  6. Hypokalemia - Following cardiac cath, replete K with goal 4.0 - Check Mg  7. PVD and Carotid Artery Occlusion - Currently on ASA and Plavix with plan to transition from ASA to anticoagulation most likely with Eliquis following catheterization and given h/o Afib with CHA2DS2VASc score of at least  4 as above. Will plan for anticoagulation and plavix and to discontinue asa following cath.  8. Hypothyroid - Consider checking TSH      For questions or updates, please contact [place] Please consult [email or web address] for contact info under     Signed, Arvil Chaco, PA-C  12/08/2018 7:27 AM

## 2018-12-08 NOTE — Progress Notes (Signed)
Pt refused to wear CPAP. Pt stated our mask  is not comfortable No distress noted.

## 2018-12-08 NOTE — Progress Notes (Signed)
ANTICOAGULATION CONSULT NOTE - Initial Consult  Pharmacy Consult for Heparin Indication: chest pain/ACS  Allergies  Allergen Reactions  . Hydralazine Rash  . Adhesive [Tape]     SKIN SENSITIVITY  . Benazepril     Nausea, vomiting and diarrhea  . Carvedilol     Blurred visino  . Clonidine Derivatives Other (See Comments)    Caused blindness in left eye for the days on the drug  . Latex Other (See Comments)  . Lisinopril Other (See Comments)    Blurred vision   . Losartan     Blurry vision if doses>25mg . Only use it for his BP is >170  . Trazodone Other (See Comments)    Cognitive impairment   . Hydrazine Yellow [Tartrazine] Itching and Rash  . Isosorbide Dinitrate Rash  . Isosorbide Mononitrate Rash    Patient Measurements: Height: 5\' 9"  (175.3 cm) Weight: 181 lb 11.2 oz (82.4 kg) IBW/kg (Calculated) : 70.7 Heparin Dosing Weight: 81.6 kg  Vital Signs: Temp: 98.2 F (36.8 C) (12/09 2214) Temp Source: Oral (12/09 2214) BP: 152/46 (12/09 2214) Pulse Rate: 53 (12/09 2214)  Labs: Recent Labs    12/07/18 1547 12/07/18 2055 12/08/18 0037  HGB 12.8*  --   --   HCT 39.1  --   --   PLT 219  --   --   APTT 31  --   --   LABPROT 14.5  --   --   INR 1.14  --   --   HEPARINUNFRC  --   --  0.47  CREATININE 0.86  --   --   TROPONINI 0.04* 2.86* 4.16*    Estimated Creatinine Clearance: 74.2 mL/min (by C-G formula based on SCr of 0.86 mg/dL).   Medical History: Past Medical History:  Diagnosis Date  . Cancer ([place])    basel cell carcinoma left ear  . Carotid artery occlusion    s/p right carotid endarterectomy  . Chronic kidney disease    renal artery stenosis  . Coronary artery disease    s/p coronary bypass graft surgery in 1992, stable  . GERD (gastroesophageal reflux disease)   . Hyperlipidemia   . Hypertension   . Myocardial infarction ([place])   . PVD (peripheral vascular disease) ([place])    s/p multiple revascularization procedures.  . Shortness of breath      Medications:  Scheduled:  Infusions:  PRN:   Assessment: 77 year old male presents to the ED with chest pain and SOB. Trop 0.04. Pharmacy consulted for heparin gtt dosing.   Goal of Therapy:  Heparin level 0.3-0.7 units/ml Monitor platelets by anticoagulation protocol: Yes   Plan:  12/10 @ 0030 HL 0.47 therapeutic. Will continue current rate and will recheck HL @ 0800 will f/u CBC w/ am labs.  Tobie Lords, PharmD, BCPS 12/08/2018,1:52 AM

## 2018-12-08 NOTE — Progress Notes (Signed)
Patient back up from specials recovery. Alert and oriented. Right and left radial sites from cath. Right radial level 1 mildly bruised, soft, left radial level 0, soft no issues. Will continue to monitor patient.

## 2018-12-08 NOTE — Progress Notes (Signed)
Pt refused cpap

## 2018-12-08 NOTE — Progress Notes (Signed)
[facility] [place] NAME: Clifford Aguilar    MR#:  [id number]  DATE OF BIRTH:  23-Sep-1941  SUBJECTIVE:  Patient seen in specials.  He is status post cardiac catheterization.  REVIEW OF SYSTEMS:    Review of Systems  Constitutional: Negative for fever, chills weight loss HENT: Negative for ear pain, nosebleeds, congestion, facial swelling, rhinorrhea, neck pain, neck stiffness and ear discharge.   Respiratory: Negative for cough, shortness of breath, wheezing  Cardiovascular: Negative for chest pain, palpitations and leg swelling.  Gastrointestinal: Negative for heartburn, abdominal pain, vomiting, diarrhea or consitpation Genitourinary: Negative for dysuria, urgency, frequency, hematuria Musculoskeletal: Negative for back pain or joint pain Neurological: Negative for dizziness, seizures, syncope, focal weakness,  numbness and headaches.  Hematological: Does not bruise/bleed easily.  Psychiatric/Behavioral: Negative for hallucinations, confusion, dysphoric mood    Tolerating Diet: yes      DRUG ALLERGIES:   Allergies  Allergen Reactions  . Hydralazine Rash  . Adhesive [Tape]     SKIN SENSITIVITY  . Benazepril     Nausea, vomiting and diarrhea  . Carvedilol     Blurred visino  . Clonidine Derivatives Other (See Comments)    Caused blindness in left eye for the days on the drug  . Latex Other (See Comments)  . Lisinopril Other (See Comments)    Blurred vision   . Losartan     Blurry vision if doses>25mg . Only use it for his BP is >170  . Trazodone Other (See Comments)    Cognitive impairment   . Hydrazine Yellow [Tartrazine] Itching and Rash  . Isosorbide Dinitrate Rash  . Isosorbide Mononitrate Rash    VITALS:  Blood pressure (!) 122/56, pulse (!) 50, temperature 97.9 F (36.6 C), temperature source Oral, resp. rate 18, height 5\' 9"  (1.753 m), weight 81.7 kg, SpO2 95 %.  PHYSICAL EXAMINATION:  Constitutional: Appears  well-developed and well-nourished. No distress. HENT: Normocephalic. Marland Kitchen Oropharynx is clear and moist.  Eyes: Conjunctivae and EOM are normal. PERRLA, no scleral icterus.  Neck: Normal ROM. Neck supple. No JVD. No tracheal deviation. CVS: RRR, S1/S2 +, no murmurs, no gallops, no carotid bruit.  Pulmonary: Effort and breath sounds normal, no stridor, rhonchi, wheezes, rales.  Abdominal: Soft. BS +,  no distension, tenderness, rebound or guarding.  Musculoskeletal: Normal range of motion. No edema and no tenderness.  Neuro: Alert. CN 2-12 grossly intact. No focal deficits. Skin: Skin is warm and dry. No rash noted. Psychiatric: Normal mood and affect.      LABORATORY PANEL:   CBC Recent Labs  Lab 12/07/18 1547  WBC 6.4  HGB 12.8*  HCT 39.1  PLT 219   ------------------------------------------------------------------------------------------------------------------  Chemistries  Recent Labs  Lab 12/07/18 1547  NA 137  K 3.6  CL 105  CO2 25  GLUCOSE 186*  BUN 19  CREATININE 0.86  CALCIUM 9.1   ------------------------------------------------------------------------------------------------------------------  Cardiac Enzymes Recent Labs  Lab 12/07/18 2055 12/08/18 0037 12/08/18 0309  TROPONINI 2.86* 4.16* 4.14*   ------------------------------------------------------------------------------------------------------------------  RADIOLOGY:  Dg Chest 2 View  Result Date: 12/07/2018 CLINICAL DATA:  77 y/o  M; episode of chest pain. EXAM: CHEST - 2 VIEW COMPARISON:  02/18/2018 chest radiograph FINDINGS: Stable cardiac silhouette given projection and technique. Post median sternotomy and CABG with wires in alignment. Aortic atherosclerosis with calcification. Clear lungs. No pleural effusion or pneumothorax. No acute osseous abnormality is evident. Multilevel degenerative changes of thoracic spine. IMPRESSION: No acute pulmonary process identified.  Electronically Signed    By: Kristine Garbe M.D.   On: 12/07/2018 16:23     ASSESSMENT AND PLAN:   77 year old male with history of CAD/CABG who presented with chest pain.  1.  Non-STEMI: Patient is status post cardiac catheterization      FINDINGS: 1. Severe native CAD, including diffusely disease proximal LAD up to 80% and CTO's of ostial LCx and ostial RCA. 2. Patent LIMA->LAD, RIMI->RCA, and SVG->diag.  Patient reportedly had CABG x 4; question if jump portion of SVG to an OM is occluded  Cardiology has recommended to restart heparin 8 hours due to atrial fibrillation Start Eliquis tomorrow if no evidence of bleeding Continue Plavix, atorvastatin, Zetia  2.  Essential hypertension: Continue Norvasc and HCTZ  3.  Hyperlipidemia: Continue statin and Zetia     Management plans discussed with the patient and he is in agreement.  CODE STATUS: full  TOTAL TIME TAKING CARE OF THIS PATIENT: 283 minutes.     POSSIBLE D/C tomorrow, DEPENDING ON CLINICAL CONDITION.   Clifford Aguilar M.D on 12/08/2018 at 1:24 PM  Between 7am to 6pm - Pager - [phone] After 6pm go to [email or web address] - password [email or web address] [facility]  Office  [phone]  CC: Primary care physician; Carmon Ginsberg, PA  Note: This dictation was prepared with Dragon dictation along with smaller phrase technology. Any transcriptional errors that result from this process are unintentional.

## 2018-12-09 ENCOUNTER — Telehealth: Payer: Self-pay | Admitting: Cardiovascular Disease

## 2018-12-09 ENCOUNTER — Encounter: Payer: Self-pay | Admitting: Internal Medicine

## 2018-12-09 DIAGNOSIS — I248 Other forms of acute ischemic heart disease: Secondary | ICD-10-CM

## 2018-12-09 LAB — CBC
HCT: 39.1 % (ref 39.0–52.0)
Hemoglobin: 12.9 g/dL — ABNORMAL LOW (ref 13.0–17.0)
MCH: 30.1 pg (ref 26.0–34.0)
MCHC: 33 g/dL (ref 30.0–36.0)
MCV: 91.1 fL (ref 80.0–100.0)
Platelets: 212 10*3/uL (ref 150–400)
RBC: 4.29 MIL/uL (ref 4.22–5.81)
RDW: 12.9 % (ref 11.5–15.5)
WBC: 6.3 10*3/uL (ref 4.0–10.5)
nRBC: 0 % (ref 0.0–0.2)

## 2018-12-09 LAB — BASIC METABOLIC PANEL
Anion gap: 6 (ref 5–15)
BUN: 18 mg/dL (ref 8–23)
CALCIUM: 8.8 mg/dL — AB (ref 8.9–10.3)
CO2: 22 mmol/L (ref 22–32)
CREATININE: 0.79 mg/dL (ref 0.61–1.24)
Chloride: 108 mmol/L (ref 98–111)
GFR calc Af Amer: >60 mL/min (ref >60)
GFR calc non Af Amer: >60 mL/min (ref >60)
Glucose, Bld: 114 mg/dL — ABNORMAL HIGH (ref 70–99)
Potassium: 4.5 mmol/L (ref 3.5–5.1)
Sodium: 136 mmol/L (ref 135–145)

## 2018-12-09 LAB — HEPARIN LEVEL (UNFRACTIONATED): Heparin Unfractionated: 0.33 IU/mL (ref 0.30–0.70)

## 2018-12-09 MED ORDER — AMLODIPINE BESYLATE 10 MG PO TABS: 10.0000 mg | ORAL_TABLET | Freq: Every day | ORAL | 0 refills | Status: DC

## 2018-12-09 MED ORDER — RIVAROXABAN 20 MG PO TABS: 20.0000 mg | ORAL_TABLET | Freq: Every day | ORAL | Status: DC

## 2018-12-09 MED ORDER — RIVAROXABAN 20 MG PO TABS: 20.0000 mg | ORAL_TABLET | Freq: Every day | ORAL | 0 refills | Status: DC

## 2018-12-09 NOTE — Progress Notes (Signed)
Patient given discharge instructions with wife at bedside. IV taken out tele monitor off. Prescriptions given, no questions or concerns. Patient going home via family vehicle.

## 2018-12-09 NOTE — Telephone Encounter (Signed)
Spoke with patient and she wanted to verify medication changes that were made during his discharge. She wanted to confirm that he would be on Plavix, Xarelto, and Amlodipine. Verified these medications with patients wife, pharmacy, and provider. She verbalized understanding with no further questions at this time. Confirmed upcoming follow up appointment with provider and she had no other concerns at this time.

## 2018-12-09 NOTE — Discharge Summary (Signed)
[facility] [place] NAME: Clifford Aguilar    MR#:  [id number]  DATE OF BIRTH:  [birth year]  DATE OF ADMISSION:  12/07/2018 ADMITTING PHYSICIAN: Clifford Harms, MD  DATE OF DISCHARGE: 12/09/2018 PRIMARY CARE PHYSICIAN: Clifford Ginsberg, PA    ADMISSION DIAGNOSIS:  Paroxysmal atrial fibrillation (HCC) [I48.0] Atypical chest pain [R07.89]  DISCHARGE DIAGNOSIS:  Active Problems:   Chest pain Elevated troponin from demand ischemia   Paroxysmal atrial fibrillation ([place])   SECONDARY DIAGNOSIS:   Past Medical History:  Diagnosis Date  . Cancer ([place])    basel cell carcinoma left ear  . Carotid artery occlusion    s/p right carotid endarterectomy  . Chronic kidney disease    renal artery stenosis  . Coronary artery disease    s/p coronary bypass graft surgery in 1992, stable  . GERD (gastroesophageal reflux disease)   . Hyperlipidemia   . Hypertension   . Myocardial infarction ([place])   . PVD (peripheral vascular disease) ([place])    s/p multiple revascularization procedures.  . Shortness of breath     HOSPITAL COURSE:   77 year old male with history of CAD/CABG who presented with chest pain.  1.    Elevated troponin due to demand ischemia: Opponent max is 4.16.  Elevation troponin was felt to be due to demand ischemia not a non-STEMI.  Demand ischemia from atrial fibrillation.    He underwent cardiac catheterization showing   1. Severe native CAD, including diffusely disease proximal LAD up to 80% and CTO's of ostial LCx and ostial RCA. 2. Patent LIMA->LAD, RIMI->RCA, and SVG->diag. Patient reportedly had CABG x 4; question if jump portion of SVG to an OM is occluded  Cardiology has recommended to continue Plavix, atorvastatin, Zetia Aspirin has been discontinued because patient will be on Xarelto for atrial fibrillation.   Side effects, alternatives, risk and benefits of anticoagulation were discussed with the patient.      2.  Essential hypertension: He will continue Norvasc and HCTZ  3.  Hyperlipidemia: Continue statin and Zetia 4.  Hypothyroid: Continue Synthroid 5.  BPH: Continue doxazosin   DISCHARGE CONDITIONS AND DIET:   Stable for discharge on heart healthy diet  CONSULTS OBTAINED:  Treatment Team:  Wellington Hampshire, MD Yolonda Kida, MD  DRUG ALLERGIES:   Allergies  Allergen Reactions  . Hydralazine Rash  . Adhesive [Tape]     SKIN SENSITIVITY  . Benazepril     Nausea, vomiting and diarrhea  . Carvedilol     Blurred visino  . Clonidine Derivatives Other (See Comments)    Caused blindness in left eye for the days on the drug  . Latex Other (See Comments)  . Lisinopril Other (See Comments)    Blurred vision   . Losartan     Blurry vision if doses>25mg . Only use it for his BP is >170  . Trazodone Other (See Comments)    Cognitive impairment   . Hydrazine Yellow [Tartrazine] Itching and Rash  . Isosorbide Dinitrate Rash  . Isosorbide Mononitrate Rash    DISCHARGE MEDICATIONS:   Allergies as of 12/09/2018      Reactions   Hydralazine Rash   Adhesive [tape]    SKIN SENSITIVITY   Benazepril    Nausea, vomiting and diarrhea   Carvedilol    Blurred visino   Clonidine Derivatives Other (See Comments)   Caused blindness in left eye for the days on the drug   Latex Other (See Comments)  Lisinopril Other (See Comments)   Blurred vision   Losartan    Blurry vision if doses>25mg . Only use it for his BP is >170   Trazodone Other (See Comments)   Cognitive impairment    Hydrazine Yellow [tartrazine] Itching, Rash   Isosorbide Dinitrate Rash   Isosorbide Mononitrate Rash      Medication List    STOP taking these medications   aspirin 81 MG tablet   losartan 25 MG tablet Commonly known as:  COZAAR     TAKE these medications   amLODipine 10 MG tablet Commonly known as:  NORVASC Take 1 tablet (10 mg total) by mouth daily. Start taking on:  12/10/2018    atorvastatin 80 MG tablet Commonly known as:  LIPITOR Take 1 tablet (80 mg total) by mouth daily. What changed:  how much to take   cholecalciferol 1000 units tablet Commonly known as:  VITAMIN D Take 1,000 Units by mouth daily.   clopidogrel 75 MG tablet Commonly known as:  PLAVIX Take 1 tablet (75 mg total) by mouth daily.   Co Q-10 100 MG Caps Take 100 mg by mouth daily.   doxazosin 8 MG tablet Commonly known as:  CARDURA Take 1 tablet (8 mg total) by mouth 2 (two) times daily.   ezetimibe 10 MG tablet Commonly known as:  ZETIA Take 1 tablet (10 mg total) by mouth daily.   Garlic 6063 MG Caps Take 7,000 mg by mouth.   hydrochlorothiazide 25 MG tablet Commonly known as:  HYDRODIURIL Take 1 tablet (25 mg total) by mouth daily.   lansoprazole 15 MG capsule Commonly known as:  PREVACID Take 1 capsule (15 mg total) by mouth daily at 12 noon. What changed:  how much to take   levothyroxine 25 MCG tablet Commonly known as:  SYNTHROID, LEVOTHROID Take 1 tablet (25 mcg total) by mouth daily before breakfast.   MELATIN PO Take 3 tablets by mouth at bedtime as needed (insomnia).   nitroGLYCERIN 0.4 MG/SPRAY spray Commonly known as:  NITROLINGUAL Place 1 spray under the tongue every 5 (five) minutes x 3 doses as needed for chest pain.   polyethylene glycol packet Commonly known as:  MIRALAX / GLYCOLAX Take 17 g by mouth daily.   rivaroxaban 20 MG Tabs tablet Commonly known as:  XARELTO Take 1 tablet (20 mg total) by mouth daily with supper.   SAW PALMETTO COMPLEX PO Take 1,725 mg by mouth daily.         Today   CHIEF COMPLAINT:  Patient is ready to be discharged without chest pain overnight   VITAL SIGNS:  Blood pressure (!) 138/95, pulse (!) 48, temperature 97.8 F (36.6 C), temperature source Oral, resp. rate 18, height 5\' 9"  (1.753 m), weight 81.7 kg, SpO2 97 %.   REVIEW OF SYSTEMS:  Review of Systems  Constitutional: Negative.  Negative for  chills, fever and malaise/fatigue.  HENT: Negative.  Negative for ear discharge, ear pain, hearing loss, nosebleeds and sore throat.   Eyes: Negative.  Negative for blurred vision and pain.  Respiratory: Negative.  Negative for cough, hemoptysis, shortness of breath and wheezing.   Cardiovascular: Negative.  Negative for chest pain, palpitations and leg swelling.  Gastrointestinal: Negative.  Negative for abdominal pain, blood in stool, diarrhea, nausea and vomiting.  Genitourinary: Negative.  Negative for dysuria.  Musculoskeletal: Negative.  Negative for back pain.  Skin: Negative.   Neurological: Negative for dizziness, tremors, speech change, focal weakness, seizures and headaches.  Endo/Heme/Allergies: Negative.  Does not bruise/bleed easily.  Psychiatric/Behavioral: Negative.  Negative for depression, hallucinations and suicidal ideas.     PHYSICAL EXAMINATION:  GENERAL:  77 y.o.-year-old patient lying in the bed with no acute distress.  NECK:  Supple, no jugular venous distention. No thyroid enlargement, no tenderness.  LUNGS: Normal breath sounds bilaterally, no wheezing, rales,rhonchi  No use of accessory muscles of respiration.  CARDIOVASCULAR: S1, S2 normal. No murmurs, rubs, or gallops.  ABDOMEN: Soft, non-tender, non-distended. Bowel sounds present. No organomegaly or mass.  EXTREMITIES: No pedal edema, cyanosis, or clubbing.  PSYCHIATRIC: The patient is alert and oriented x 3.  SKIN: No obvious rash, lesion, or ulcer.   DATA REVIEW:   CBC Recent Labs  Lab 12/09/18 0804  WBC 6.3  HGB 12.9*  HCT 39.1  PLT 212    Chemistries  Recent Labs  Lab 12/09/18 0804  NA 136  K 4.5  CL 108  CO2 22  GLUCOSE 114*  BUN 18  CREATININE 0.79  CALCIUM 8.8*    Cardiac Enzymes Recent Labs  Lab 12/07/18 2055 12/08/18 0037 12/08/18 0309  TROPONINI 2.86* 4.16* 4.14*    Microbiology Results  @[email or web address] @  RADIOLOGY:  Dg Chest 2 View  Result Date:  12/07/2018 CLINICAL DATA:  77 y/o  M; episode of chest pain. EXAM: CHEST - 2 VIEW COMPARISON:  02/18/2018 chest radiograph FINDINGS: Stable cardiac silhouette given projection and technique. Post median sternotomy and CABG with wires in alignment. Aortic atherosclerosis with calcification. Clear lungs. No pleural effusion or pneumothorax. No acute osseous abnormality is evident. Multilevel degenerative changes of thoracic spine. IMPRESSION: No acute pulmonary process identified. Electronically Signed   By: Kristine Garbe M.D.   On: 12/07/2018 16:23      Allergies as of 12/09/2018      Reactions   Hydralazine Rash   Adhesive [tape]    SKIN SENSITIVITY   Benazepril    Nausea, vomiting and diarrhea   Carvedilol    Blurred visino   Clonidine Derivatives Other (See Comments)   Caused blindness in left eye for the days on the drug   Latex Other (See Comments)   Lisinopril Other (See Comments)   Blurred vision   Losartan    Blurry vision if doses>25mg . Only use it for his BP is >170   Trazodone Other (See Comments)   Cognitive impairment    Hydrazine Yellow [tartrazine] Itching, Rash   Isosorbide Dinitrate Rash   Isosorbide Mononitrate Rash      Medication List    STOP taking these medications   aspirin 81 MG tablet   losartan 25 MG tablet Commonly known as:  COZAAR     TAKE these medications   amLODipine 10 MG tablet Commonly known as:  NORVASC Take 1 tablet (10 mg total) by mouth daily. Start taking on:  12/10/2018   atorvastatin 80 MG tablet Commonly known as:  LIPITOR Take 1 tablet (80 mg total) by mouth daily. What changed:  how much to take   cholecalciferol 1000 units tablet Commonly known as:  VITAMIN D Take 1,000 Units by mouth daily.   clopidogrel 75 MG tablet Commonly known as:  PLAVIX Take 1 tablet (75 mg total) by mouth daily.   Co Q-10 100 MG Caps Take 100 mg by mouth daily.   doxazosin 8 MG tablet Commonly known as:  CARDURA Take 1 tablet  (8 mg total) by mouth 2 (two) times daily.   ezetimibe 10 MG tablet Commonly known as:  ZETIA Take 1 tablet (  10 mg total) by mouth daily.   Garlic 7412 MG Caps Take 7,000 mg by mouth.   hydrochlorothiazide 25 MG tablet Commonly known as:  HYDRODIURIL Take 1 tablet (25 mg total) by mouth daily.   lansoprazole 15 MG capsule Commonly known as:  PREVACID Take 1 capsule (15 mg total) by mouth daily at 12 noon. What changed:  how much to take   levothyroxine 25 MCG tablet Commonly known as:  SYNTHROID, LEVOTHROID Take 1 tablet (25 mcg total) by mouth daily before breakfast.   MELATIN PO Take 3 tablets by mouth at bedtime as needed (insomnia).   nitroGLYCERIN 0.4 MG/SPRAY spray Commonly known as:  NITROLINGUAL Place 1 spray under the tongue every 5 (five) minutes x 3 doses as needed for chest pain.   polyethylene glycol packet Commonly known as:  MIRALAX / GLYCOLAX Take 17 g by mouth daily.   rivaroxaban 20 MG Tabs tablet Commonly known as:  XARELTO Take 1 tablet (20 mg total) by mouth daily with supper.   SAW PALMETTO COMPLEX PO Take 1,725 mg by mouth daily.         Management plans discussed with the patient and he is in agreement. Stable for discharge home  Patient should follow up with pcp  CODE STATUS:     Code Status Orders  (From admission, onward)         Start     Ordered   12/07/18 2045  Full code  Continuous     12/07/18 2045        Code Status History    Date Active Date Inactive Code Status Order ID Comments User Context   03/16/2014 1808 03/17/2014 1347 Full Code [id number]  Rosetta Posner, MD Inpatient      TOTAL TIME TAKING CARE OF THIS PATIENT: 38 minutes.    Note: This dictation was prepared with Dragon dictation along with smaller phrase technology. Any transcriptional errors that result from this process are unintentional.  Enora Trillo M.D on 12/09/2018 at 9:55 AM  Between 7am to 6pm - Pager - [phone] After 6pm go to  [email or web address] - password [email or web address] [facility]  Office  [phone]  CC: Primary care physician; Clifford Aguilar, [place]

## 2018-12-09 NOTE — Care Management Note (Signed)
Case Management Note  Patient Details  Name: Jaylenn Altier MRN: [id number] Date of Birth: [birth year]  Subjective/Objective:      Patient is from home with wife.  He is independent in all ADL's.  Admitted with chest pain; status post cardiac catheterization.  No stent placement.  Starting on Xarelto.  This RNCM gave patient coupon for 30 days free.     He is current with [facility].  Denies difficulties obtaining medications or accessing medical care.  Discharging today with wife. They do have part D insurance.  No further needs identified at this time by this RNCM.           Action/Plan:   Expected Discharge Date:  12/09/18               Expected Discharge Plan:  Home/Self Care  In-House Referral:     Discharge planning Services  CM Consult  Post Acute Care Choice:    Choice offered to:     DME Arranged:    DME Agency:     HH Arranged:    HH Agency:     Status of Service:  Completed, signed off  If discussed at [employer] of Stay Meetings, dates discussed:    Additional Comments:  Elza Rafter, RN 12/09/2018, 10:46 AM

## 2018-12-09 NOTE — Plan of Care (Signed)
  Problem: Education: ?Goal: Ability to demonstrate management of disease process will improve ?Outcome: Adequate for Discharge ?Goal: Ability to verbalize understanding of medication therapies will improve ?Outcome: Adequate for Discharge ?Goal: Individualized Educational Video(s) ?Outcome: Adequate for Discharge ?  ?Problem: Activity: ?Goal: Capacity to carry out activities will improve ?Outcome: Adequate for Discharge ?  ?Problem: Cardiac: ?Goal: Ability to achieve and maintain adequate cardiopulmonary perfusion will improve ?Outcome: Adequate for Discharge ?  ?Problem: Education: ?Goal: Knowledge of General Education information will improve ?Description: Including pain rating scale, medication(s)/side effects and non-pharmacologic comfort measures ?Outcome: Adequate for Discharge ?  ?Problem: Health Behavior/Discharge Planning: ?Goal: Ability to manage health-related needs will improve ?Outcome: Adequate for Discharge ?  ?Problem: Clinical Measurements: ?Goal: Ability to maintain clinical measurements within normal limits will improve ?Outcome: Adequate for Discharge ?Goal: Will remain free from infection ?Outcome: Adequate for Discharge ?Goal: Diagnostic test results will improve ?Outcome: Adequate for Discharge ?Goal: Respiratory complications will improve ?Outcome: Adequate for Discharge ?Goal: Cardiovascular complication will be avoided ?Outcome: Adequate for Discharge ?  ?Problem: Activity: ?Goal: Risk for activity intolerance will decrease ?Outcome: Adequate for Discharge ?  ?Problem: Nutrition: ?Goal: Adequate nutrition will be maintained ?Outcome: Adequate for Discharge ?  ?Problem: Coping: ?Goal: Level of anxiety will decrease ?Outcome: Adequate for Discharge ?  ?Problem: Elimination: ?Goal: Will not experience complications related to bowel motility ?Outcome: Adequate for Discharge ?Goal: Will not experience complications related to urinary retention ?Outcome: Adequate for Discharge ?  ?Problem: Pain  Managment: ?Goal: General experience of comfort will improve ?Outcome: Adequate for Discharge ?  ?Problem: Safety: ?Goal: Ability to remain free from injury will improve ?Outcome: Adequate for Discharge ?  ?Problem: Skin Integrity: ?Goal: Risk for impaired skin integrity will decrease ?Outcome: Adequate for Discharge ?  ?Problem: Education: ?Goal: Understanding of CV disease, CV risk reduction, and recovery process will improve ?Outcome: Adequate for Discharge ?Goal: Individualized Educational Video(s) ?Outcome: Adequate for Discharge ?  ?Problem: Activity: ?Goal: Ability to return to baseline activity level will improve ?Outcome: Adequate for Discharge ?  ?Problem: Cardiovascular: ?Goal: Ability to achieve and maintain adequate cardiovascular perfusion will improve ?Outcome: Adequate for Discharge ?Goal: Vascular access site(s) Level 0-1 will be maintained ?Outcome: Adequate for Discharge ?  ?Problem: Health Behavior/Discharge Planning: ?Goal: Ability to safely manage health-related needs after discharge will improve ?Outcome: Adequate for Discharge ?  ?

## 2018-12-09 NOTE — Telephone Encounter (Signed)
Pt c/o medication issue:  1. Name of Medication: all of them   2. How are you currently taking this medication (dosage and times per day)?   3. Are you having a reaction (difficulty breathing--STAT)? No   4. What is your medication issue? Patient had cath yesterday and wants to go over medication list and changes to verify

## 2018-12-09 NOTE — Progress Notes (Signed)
Progress Note  Patient Name: Clifford Aguilar Date of Encounter: 12/09/2018  Primary Cardiologist: Dr. Rockey Situ  Subjective   S/p LHC yesterday 12/08/2018 as below in CV studies.  On exam today, patient denies cardiac complaints of chest pain, palpitations, and racing heart rate.  R/L radial cath sites examined and right sided ecchymosis noted at cath site.  Radial cath site bandages clean/dry/intact with no signs of infection.  Reviewed post cardiac catheterization precautions and medications with no additional questions.  Patient and wife excited to be going home today.  Inpatient Medications    Scheduled Meds: . amLODipine  10 mg Oral Daily  . aspirin EC  81 mg Oral Daily  . atorvastatin  80 mg Oral Daily  . cholecalciferol  1,000 Units Oral Daily  . clopidogrel  75 mg Oral Daily  . doxazosin  8 mg Oral BID  . ezetimibe  10 mg Oral Daily  . hydrochlorothiazide  25 mg Oral Daily  . levothyroxine  25 mcg Oral Q0600  . Melatonin  5 mg Oral QHS  . pantoprazole  40 mg Oral Daily  . polyethylene glycol  17 g Oral Daily  . sodium chloride flush  3 mL Intravenous Q12H   Continuous Infusions: . sodium chloride    . heparin 950 Units/hr (12/09/18 0450)   PRN Meds: sodium chloride, acetaminophen, ALPRAZolam, morphine injection, nitroGLYCERIN, ondansetron (ZOFRAN) IV, sodium chloride flush   Vital Signs    Vitals:   12/08/18 1800 12/08/18 2126 12/09/18 0512 12/09/18 0737  BP: (!) 118/53 (!) 116/57 (!) 118/58 (!) 138/95  Pulse: (!) 48 (!) 55 (!) 50 (!) 48  Resp:  18 20 18   Temp:  98.3 F (36.8 C) 98.3 F (36.8 C) 97.8 F (36.6 C)  TempSrc:  Oral Oral Oral  SpO2:  97% 97% 97%  Weight:      Height:        Intake/Output Summary (Last 24 hours) at 12/09/2018 0855 Last data filed at 12/09/2018 1761 Gross per 24 hour  Intake 194.31 ml  Output 2100 ml  Net -1905.69 ml   Filed Weights   12/07/18 1754 12/07/18 2214 12/08/18 0500  Weight: 81.6 kg 82.4 kg 81.7 kg     Telemetry    Sinus brady  - Personally Reviewed  ECG    Post cath EKG shows sinus bradycardia 58 bpm with nonspecific T wave abnormality T wave inversion evident in inferior and lateral leads - Personally Reviewed  Physical Exam   GEN: No acute distress.  Sitting in chair and speaking with the chaplain, accompanied by his wife Neck: No JVD Cardiac: bradycardic, regular rhytm, no murmurs, rubs, or gallops.  Respiratory: Clear to auscultation bilaterally. GI: Soft, nontender, non-distended  MS: No edema; No deformity.  R/L cardiac catheterization side without signs of infection.  Right-sided ecchymosis noted.  Bandages clean, dry, intact. Neuro:  Nonfocal  Psych: Normal affect   Labs    Chemistry Recent Labs  Lab 12/07/18 1547 12/09/18 0804  NA 137 136  K 3.6 4.5  CL 105 108  CO2 25 22  GLUCOSE 186* 114*  BUN 19 18  CREATININE 0.86 0.79  CALCIUM 9.1 8.8*  GFRNONAA >60 >60  GFRAA >60 >60  ANIONGAP 7 6     Hematology Recent Labs  Lab 12/07/18 1547 12/09/18 0804  WBC 6.4 6.3  RBC 4.32 4.29  HGB 12.8* 12.9*  HCT 39.1 39.1  MCV 90.5 91.1  MCH 29.6 30.1  MCHC 32.7 33.0  RDW 12.6  12.9  PLT 219 212    Cardiac Enzymes Recent Labs  Lab 12/07/18 1547 12/07/18 2055 12/08/18 0037 12/08/18 0309  TROPONINI 0.04* 2.86* 4.16* 4.14*   No results for input(s): TROPIPOC in the last 168 hours.   BNPNo results for input(s): BNP, PROBNP in the last 168 hours.   DDimer No results for input(s): DDIMER in the last 168 hours.   Radiology    Dg Chest 2 View  Result Date: 12/07/2018 CLINICAL DATA:  77 y/o  M; episode of chest pain. EXAM: CHEST - 2 VIEW COMPARISON:  02/18/2018 chest radiograph FINDINGS: Stable cardiac silhouette given projection and technique. Post median sternotomy and CABG with wires in alignment. Aortic atherosclerosis with calcification. Clear lungs. No pleural effusion or pneumothorax. No acute osseous abnormality is evident. Multilevel  degenerative changes of thoracic spine. IMPRESSION: No acute pulmonary process identified. Electronically Signed   By: Kristine Garbe M.D.   On: 12/07/2018 16:23    Cardiac Studies   12/08/2018 LHC Conclusions: 1. Severe native CAD, including diffusely disease LMCA/proximal LAD up to 80% and CTO's of ostial LCx and ostial RCA. 2. Patent LIMA->LAD, RIMA->RCA, and SVG->ramus.  Jump portion of SVG from ramus->OM appears chronically occluded.  3. Mildly elevated LVEDP with hyperdynamic LVEF. 4. PSVT noted during catheterization. Recommendations: 1. Continue medical therapy. 2. Restart heparin 8 hours after sheaths are removed, given a-fib on admission. 3. Consider discontinuation of ASA and addition of NOAC tomorrow if no evidence of bleeding or vascular injury. Continue current medications for antianginal therapy; could add ranolazine if recurrent chest pain. NSTEMI may have represented demand ischemia in the setting of fixed CAD and a-fib with RVR. Coronary Diagrams   Diagnostic  Dominance: Right        12/08/2018 TTE Study Conclusions - Left ventricle: The cavity size was normal. Wall thickness was   increased in a pattern of mild LVH. Systolic function was normal.   The estimated ejection fraction was in the range of 60% to 65%.   Features are consistent with a pseudonormal left ventricular   filling pattern, with concomitant abnormal relaxation and   increased filling pressure (grade 2 diastolic dysfunction). - Right ventricle: The cavity size was at the upper limits of   normal. Systolic function was grossly normal. - Right atrium: The atrium was mildly dilated.   Patient Profile     77 y.o. male with a hx of paroxysmal atrial fibrillation, PVD with L iliac stent, HTN, HLD, CAD s/p 1992 CABG x4, CKD with renal artery stenosis and L renal angioplasty (12/03/2011) and stenting, carotid artery disease s/p R carotid endarterectomy with residual 60-70% residual  disease and chronically L occluded carotid artery, hypothyroid, and previous history of smoking who is being seen today for the evaluation of NSTEMI s/p LHC 12/08/18.  Assessment & Plan    1. NSTEMI s/p h/o CABG x4 ~1992 - CP free s/p LHC. Troponin 0.04  2.86  4.16  4.14. Etiology may have been demand ischemia d/t fixed CAD and rate of afib with RVR. Initial EKG in Atrial Fibrillation but converted to SR prior to cath and post catheterization EKG 12/11 shows sinus bradycardia as above.  - S/p 12/10 LHC with findings as above showing severe CAD with diffuse disease of proximal LAD up to 80% and CTOs of ostial LCx and ostial RCA, patent LIMA --> LAD, RIMI --> RCA and SVG --> diag. Mildly elevated LVEDP with hyperdynamic LVEF, and PSVT during cath.  - Continue medial therapy. ASA stopped  as started on Xarelto for Afib. Not on BB d/t intolerance below. Continue amlodipine, plavix, statin, PRN SL nitro if needed for CP. - Scheduled for TCM follow-up with Dr. Rockey Situ, 12/12.  2. Atrial Fibrillation - CHA2DS2VASc score of at least 4 (HTN, agex2, vascular disease).  - Discontinue ASA. Start Xarelto 20mg  daily.   3. HTN - Continue antihypertensives following cardiac catheterization and uptitrate as HR and BP allow. Given patient intolerances, started on amlodipine 10mg  daily in addition to his Cardura 8mg  daily. Patient and wife indicated past history of ineffectiveness with beta blockers and nausea with ACE inhibitors. Patient is also on PTA Losartan 25mg , given nausea with ACEi.    4. HLD - Continue aggressive statin therapy following cardiac catheretization with atorvastatin 80mg  daily.  - Continue Zetia 10mg  daily. - 12/10 LDL 69 and at goal <70  5. CKD with h/o renal stenting - Renal function stable with Cr 0.86  0.79 (12/11), K 3.6  4.5 - Will recheck BMP at outpatient follow-up    6. Hypokalemia - Resolved. Most recent K 4.5 (12/11)  7. PVD and Carotid Artery Occlusion - Stopped ASA to  start anticoagulation with Xarelto given paroxysmal Afib with CHA2DS2VASc score of at least 4 as above. Continue Plavix.  8. Hypothyroid - Recommend recheck TSH as outpatient as previous 03/2018 TSH was 5.290 and patient has history of hypothyroid and likely needs optimization of thyroid medication.  - Recommend follow-up with PCP or endocrinologist  For questions or updates, please contact [facility] Please consult [email or web address] for contact info under        Signed, Arvil Chaco, PA-C  12/09/2018, 8:55 AM

## 2018-12-10 ENCOUNTER — Telehealth: Payer: Self-pay

## 2018-12-10 NOTE — Telephone Encounter (Signed)
Transition Care Management Follow-up Telephone Call  Date of discharge and from where: [facility] on 12/09/18  How have you been since you were released from the hospital? Doing well, slept good last night, still weak. Going to start walking slowly on the treadmill tomorrow to get some exercise. Declined pain, swelling, fever or n/v/d.  Any questions or concerns? No   Items Reviewed:  Did the pt receive and understand the discharge instructions provided? Yes   Medications obtained and verified? Pt declined and states he will f/u on these at [facility] apt.   Any new allergies since your discharge? No   Dietary orders reviewed? Yes  Do you have support at home? Yes   Other (ie: DME, Home Health, etc) N/A  Functional Questionnaire: (I = Independent and D = Dependent)  Bathing/Dressing- I   Meal Prep- I  Eating- I  Maintaining continence- I  Transferring/Ambulation- I  Managing Meds- I   Follow up appointments reviewed:    PCP Hospital f/u appt confirmed? Yes  Scheduled to see Carmon Ginsberg on 12/16/18 @ 10:00 AM.  [facility] f/u appt confirmed? Yes   Are transportation arrangements needed? No   If their condition worsens, is the pt aware to call  their PCP or go to the ED? Yes  Was the patient provided with contact information for the PCP's office or ED? Yes  Was the pt encouraged to call back with questions or concerns? Yes

## 2018-12-14 ENCOUNTER — Other Ambulatory Visit: Payer: Self-pay

## 2018-12-14 NOTE — Patient Outreach (Signed)
[facility]) Care Management  12/14/2018  Charles Andringa 1941/08/25 [id number]   EMMI-General discharge  Return call received from patient, HIPAA verified. Reviewed and addressed red alert from 12/11/18. Patient states, "I am feeling good and doing great". He verbalizes knowledge and understanding about who and when to call for new or worsening symptoms. Patient verbalizes understanding on how to take his NTG if needed for chest pain. He denies having any questions about his meds, states he is taking all medications exactly as prescribed. Patient lives with his wife. He is able to drive himself to MD appts.and verbalizes knowledge of upcoming appt with PCP/PA. RN CM reinforced the importance of calling 911 for recurrent chest pain not relieved by NTG. RN CM encouraged patient to call the [facility] 24/7 nurse line if needed, provided, phone number, Patient denies having questions or concerns at this time and is appreciative of the call..    Plan: RN CM will close case at this time due patient is stable and no CM needs are identified at this time.   Barb Merino, RN,CCM [facility] Care Management Care Management Coordinator Direct Phone: [phone] Toll Free: [phone] Fax: [phone]

## 2018-12-14 NOTE — Patient Outreach (Addendum)
[facility]) Care Management  12/14/2018  Clifford Aguilar [birth year] [id number]   EMMI-General discharge  RED ON EMMI ALERT Day # 1 Date: 12/11/18 Red Alert Reason: "Know Who To Call About Changes In Condition? No"  Outreach attempt # 1 unsuccessful. RN CM left a voicemail message with a call back number and hours of availability, requesting a return phone call.    Plan: RN CM will make outreach attempt to patient within 3-4 business days, unsuccessful outreach letter sent to patient.    Barb Merino, RN,CCM [facility] Care Management Care Management Coordinator Direct Phone: [phone] Toll Free: [phone] Fax: [phone]

## 2018-12-16 ENCOUNTER — Ambulatory Visit: Payer: PPO

## 2018-12-16 ENCOUNTER — Ambulatory Visit (INDEPENDENT_AMBULATORY_CARE_PROVIDER_SITE_OTHER): Payer: PPO | Admitting: Family Medicine

## 2018-12-16 ENCOUNTER — Encounter: Payer: Self-pay | Admitting: Family Medicine

## 2018-12-16 VITALS — BP 130/60 | HR 51 | Temp 98.1°F | Wt 185.2 lb

## 2018-12-16 DIAGNOSIS — I480 Paroxysmal atrial fibrillation: Secondary | ICD-10-CM | POA: Diagnosis not present

## 2018-12-16 NOTE — Progress Notes (Signed)
  Subjective:     Patient ID: Clifford Aguilar, male   DOB: 02-27-1941, 77 y.o.   MRN: [id number] Chief Complaint  Patient presents with  . Hospitalization Follow-up    Patient was seen at [facility] admitted on 12/09 and discharge 12/09/18. Patient with A-Fib. Patient reports that he is doing better but gets tired easy.   HPI Reports compliance with Xarelto and amlodipine. Home SBP's in 130 range primarily. No unusual bleeding or increased leg swelling. Has not had chest pain or palpitations. Accompanied by his wife today.  Review of Systems     Objective:   Physical Exam Constitutional:      General: He is not in acute distress.    Appearance: Normal appearance.  Cardiovascular:     Rate and Rhythm: Normal rate and regular rhythm.  Pulmonary:     Breath sounds: Normal breath sounds.  Musculoskeletal:     Right lower leg: No edema.     Left lower leg: No edema.  Neurological:     Mental Status: He is alert.        Assessment:    1. Paroxysmal atrial fibrillation (HCC)     Plan:    F/u with cardiology as scheduled. Watch for unusual bleeding. Notify cardiology for palpitations.

## 2018-12-16 NOTE — Patient Instructions (Signed)
Do follow up with cardiology as scheduled. Monitor for excessive bleeding on Xarelto or irregular heart beat.

## 2018-12-20 NOTE — Progress Notes (Signed)
Cardiology Office Note  Date:  12/21/2018   ID:  Clifford Aguilar, Clifford Aguilar [birth year], MRN [id number]  PCP:  Carmon Ginsberg, [place]   Chief Complaint  Patient presents with  . other    ER f/u for STEMI. Medications reviewed verbally.    HPI:  Mr. Dez Stauffer is a 77 years old with past medical history of PAF on anticoagulation CAD,  peripheral vascular disease,  bypass surgery in 1992,  carotid endarterectomy on the right with residual 60-70% disease on last ultrasound in 2013 , chronically occluded left carotid artery,  multiple surgical procedures on his lower extremity by Dr. Donnetta Hutching,  left iliac stent and left renal angioplasty with Dr. 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% who presents for routine followup of his CAD, s/p CABG   Recent episode of paroxysmal atrial fibrillation Went to dentist, then out to lunch Did not feel well, took NTG Lay down in the bed Woke up with chest discomfort, did not feel well Went to the ER, noted to be in atrial fib with RVR, December 07, 2018 Hospital records reviewed with the patient in detail  Admitted to the hospital, that afternoon went back into normal sinus rhythm Elevated troponin due to demand ischemia:  Troponin 4.16. Catheterization performed 1. Severe native CAD, including diffusely disease proximal LAD up to 80% and CTO's of ostial LCx and ostial RCA. 2. Patent LIMA->LAD, RIMI->RCA, and SVG->diag. Patient reportedly had CABG x 4; question if jump portion of SVG to an OM is occluded  Aspirin has been discontinued and started on Xarelto for atrial fibrillation.   Since discharge denies any tachycardia or palpitations concerning for recurrent atrial fibrillation   total cholesterol 130 LDL 70  EKG personally reviewed by myself on todays visit Shows normal sinus rhythm rate 61 bpm APCs and  PVCs  Other past medical history reviewed Previously had issues with benazepril, GI problems, diarrhea Symptoms improved by stopping benazepril  Previous carotid ultrasound Carotid 10/16 40 to 59% on the right Followed in [place]  in January 2015, he developed symptoms of "head heaviness ", with discomfort in the back of his neck He took a NTG, and subsequently his BP dropped, he had syncope, family called 911, he was taken by emt to [place] It was felt his hypotension related to treatment with Cardura in addition to taking sublingual nitroglycerin in the standing position. He ruled out for myocardial infarction. He underwent a nuclear stress test which showed no evidence of ischemia. Echocardiogram was also unremarkable. He was discharged home on hydralazine in addition to his other blood pressure medications. Since then he reports stopping hydralazine secondary to rash.  In fact he has numerous medications that have given him a rash   3/15 he was seen by Dr. Donnetta Hutching Found to have renal artery stenosis that had progressed,  3/18 he had catheterization for renal artery stent placement  Previous history of blurry vision episodes.   He has not had a cardiac catheterization or stress test in 10 years. He believes he had a catheterization 10 years ago, 3 catheterizations since his bypass surgery 20 years ago.  He quit smoking after his bypass surgery in 1992.  Allergies: imdur/isoorbide dinitrate, lisinsopril, clonidine, hydralazine, coreg,  Benazepril   PMH:   has a past medical history of Cancer ([place]), Carotid artery occlusion, Chronic kidney disease, Coronary artery disease, GERD (gastroesophageal reflux disease), Hyperlipidemia, Hypertension,  Myocardial infarction [facility]), PVD (peripheral vascular disease) ([place]), and Shortness of breath.  PSH:    Past Surgical History:  Procedure Laterality Date  . abdomial angiogram  03-16-2014  . ABDOMINAL ANGIOGRAM N/A 03/16/2014    Procedure: ABDOMINAL ANGIOGRAM;  Surgeon: Rosetta Posner, MD;  Location: [facility] CATH LAB;  Service: Cardiovascular;  Laterality: N/A;  . ABDOMINAL AORTAGRAM N/A 12/03/2011   Procedure: ABDOMINAL AORTAGRAM;  Surgeon: Serafina Mitchell, MD;  Location[facility] CATH LAB;  Service: Cardiovascular;  Laterality: N/A;  . CAROTID ENDARTERECTOMY     Right. With 46% stenosis in the right by recent Doppler  . COLONOSCOPY WITH PROPOFOL N/A 03/12/2016   Procedure: COLONOSCOPY WITH PROPOFOL;  Surgeon: [name], MD;  Location: [facility] ENDOSCOPY;  Service: Endoscopy;  Laterality: N/A;  . CORONARY ARTERY BYPASS GRAFT  1992  . left common iliac  12/03/2011  . LEFT HEART CATH AND CORS/GRAFTS ANGIOGRAPHY Left 12/08/2018   Procedure: LEFT HEART CATH AND CORS/GRAFTS ANGIOGRAPHY;  Surgeon: Nelva Bush, MD;  Location: [facility] CV LAB;  Service: Cardiovascular;  Laterality: Left;  . Peripheral vascular revascularization     . RENAL ANGIOGRAM  03-16-2014  . RENAL ANGIOGRAM Left 12/03/2011   Procedure: RENAL ANGIOGRAM;  Surgeon: Serafina Mitchell, MD;  Location: [facility] CATH LAB;  Service: Cardiovascular;  Laterality: Left;  lt renal artery stent  . RENAL ANGIOGRAM N/A 03/16/2014   Procedure: RENAL ANGIOGRAM;  Surgeon: Rosetta Posner, MD;  Location: [facility] CATH LAB;  Service: Cardiovascular;  Laterality: N/A;  . renal artery stenosis  12/03/2011  . SHOULDER ARTHROSCOPY     Right Amnioplasty; Acromioclavicular joint resection and arthroscopic debridement  . Surgery for Sleep Apnea      Current Outpatient Medications  Medication Sig Dispense Refill  . amLODipine (NORVASC) 10 MG tablet Take 1 tablet (10 mg total) by mouth daily. 30 tablet 0  . atorvastatin (LIPITOR) 80 MG tablet Take 1 tablet (80 mg total) by mouth daily. (Patient taking differently: Take 40 mg by mouth daily. ) 90 tablet 4  . cholecalciferol (VITAMIN D) 1000 units tablet Take 1,000 Units by mouth daily.    . clopidogrel (PLAVIX) 75 MG tablet Take 1 tablet (75 mg total)  by mouth daily. 90 tablet 3  . Coenzyme Q10 (CO Q-10) 100 MG CAPS Take 100 mg by mouth daily.    Marland Kitchen doxazosin (CARDURA) 8 MG tablet Take 1 tablet (8 mg total) by mouth 2 (two) times daily. 180 tablet 3  . ezetimibe (ZETIA) 10 MG tablet Take 1 tablet (10 mg total) by mouth daily. 90 tablet 3  . Garlic 6387 MG CAPS Take 7,000 mg by mouth.     . hydrochlorothiazide (HYDRODIURIL) 25 MG tablet Take 1 tablet (25 mg total) by mouth daily. 90 tablet 3  . lansoprazole (PREVACID) 15 MG capsule Take 1 capsule (15 mg total) by mouth daily at 12 noon. (Patient taking differently: Take 30 mg by mouth daily at 12 noon. ) 30 capsule 6  . levothyroxine (SYNTHROID, LEVOTHROID) 25 MCG tablet Take 1 tablet (25 mcg total) by mouth daily before breakfast. 90 tablet 3  . Melatonin-Pyridoxine (MELATIN PO) Take 3 tablets by mouth at bedtime as needed (insomnia).     . nitroGLYCERIN (NITROLINGUAL) 0.4 MG/SPRAY spray Place 1 spray under the tongue every 5 (five) minutes x 3 doses as needed for chest pain. 12 g 3  . polyethylene glycol (MIRALAX / GLYCOLAX) packet Take 17 g by mouth daily. 90 packet 3  . rivaroxaban (XARELTO)  20 MG TABS tablet Take 1 tablet (20 mg total) by mouth daily with supper. 30 tablet 0  . Zn-Pyg Afri-Nettle-Saw Palmet (SAW PALMETTO COMPLEX PO) Take 1,725 mg by mouth daily.      No current facility-administered medications for this visit.      Allergies:   Hydralazine; Adhesive [tape]; Benazepril; Carvedilol; Clonidine derivatives; Latex; Lisinopril; Losartan; Trazodone; Hydrazine yellow [tartrazine]; Isosorbide dinitrate; and Isosorbide mononitrate   Social History:  The patient  reports that he quit smoking about 27 years ago. His smoking use included cigarettes. He quit smokeless tobacco use about 27 years ago. He reports current alcohol use of about 1.0 - 2.0 standard drinks of alcohol per week. He reports that he does not use drugs.   Family History:   family history includes Heart disease in an  other family member; Stroke in his brother, mother, and sister.    Review of Systems: Review of Systems  Constitutional: Negative.   Respiratory: Negative.   Cardiovascular: Negative.   Gastrointestinal: Negative.   Musculoskeletal: Negative.   Neurological: Negative.   Psychiatric/Behavioral: Negative.   All other systems reviewed and are negative.    PHYSICAL EXAM: VS:  BP (!) 148/70 (BP Location: Left Arm, Patient Position: Sitting, Cuff Size: Normal)   Pulse 61   Ht 5\' 9"  (1.753 m)   Wt 191 lb (86.6 kg)   BMI 28.21 kg/m  , BMI Body mass index is 28.21 kg/m. Constitutional:  oriented to person, place, and time. No distress.  HENT:  Head: Grossly normal Eyes:  no discharge. No scleral icterus.  Neck: No JVD, no carotid bruits  Cardiovascular: Regular rate and rhythm, no murmurs appreciated Pulmonary/Chest: Clear to auscultation bilaterally, no wheezes or rails Abdominal: Soft.  no distension.  no tenderness.  Musculoskeletal: Normal range of motion Neurological:  normal muscle tone. Coordination normal. No atrophy Skin: Skin warm and dry Psychiatric: normal affect, pleasant   Recent Labs: 04/15/2018: ALT 16; TSH 5.290 12/09/2018: BUN 18; Creatinine, Ser 0.79; Hemoglobin 12.9; Platelets 212; Potassium 4.5; Sodium 136    Lipid Panel Lab Results  Component Value Date   CHOL 122 12/08/2018   HDL 38 (L) 12/08/2018   LDLCALC 69 12/08/2018   TRIG 73 12/08/2018      Wt Readings from Last 3 Encounters:  12/21/18 191 lb (86.6 kg)  12/16/18 185 lb 3.2 oz (84 kg)  12/08/18 180 lb 1.6 oz (81.7 kg)       ASSESSMENT AND PLAN:  Atherosclerosis of native coronary artery of native heart without angina pectoris - Plan: EKG 12-Lead Denies any anginal symptoms No further work-up at this time  Bilateral carotid artery stenosis - Plan: EKG 12-Lead Followed in [place], stable disease Lipid panel at goal non-smoker,  PAD (peripheral artery disease) ([place]) - Plan: EKG  12-Lead We will need to avoid  low blood pressure given mesenteric disease, vision problems with low blood pressure Denies any claudication symptoms  Essential hypertension Recommended he add metoprolol succinate 12.5 mg daily  Paroxysmal atrial fibrillation Rapid rate in the emergency room causing demand ischemia Cardiac catheterization results reviewed  Mixed hyperlipidemia continue Lipitor up to 80 mg daily with Zetia  Recommend a low carbohydrate diet   Total encounter time more than 25 minutes  Greater than 50% was spent in counseling and coordination of care with the patient   Disposition:   F/U  3 months   No orders of the defined types were placed in this encounter.    Signed, Tim  Rockey Situ, M.D., Ph.D. 12/21/2018  [place], [place]

## 2018-12-21 ENCOUNTER — Ambulatory Visit (INDEPENDENT_AMBULATORY_CARE_PROVIDER_SITE_OTHER): Payer: PPO | Admitting: Cardiovascular Disease

## 2018-12-21 ENCOUNTER — Encounter: Payer: Self-pay | Admitting: Cardiovascular Disease

## 2018-12-21 VITALS — BP 148/70 | HR 61 | Ht 69.0 in | Wt 191.0 lb

## 2018-12-21 DIAGNOSIS — I480 Paroxysmal atrial fibrillation: Secondary | ICD-10-CM

## 2018-12-21 DIAGNOSIS — I25118 Atherosclerotic heart disease of native coronary artery with other forms of angina pectoris: Secondary | ICD-10-CM | POA: Diagnosis not present

## 2018-12-21 DIAGNOSIS — I739 Peripheral vascular disease, unspecified: Secondary | ICD-10-CM

## 2018-12-21 DIAGNOSIS — I10 Essential (primary) hypertension: Secondary | ICD-10-CM | POA: Diagnosis not present

## 2018-12-21 DIAGNOSIS — I701 Atherosclerosis of renal artery: Secondary | ICD-10-CM

## 2018-12-21 DIAGNOSIS — E782 Mixed hyperlipidemia: Secondary | ICD-10-CM | POA: Diagnosis not present

## 2018-12-21 DIAGNOSIS — I6523 Occlusion and stenosis of bilateral carotid arteries: Secondary | ICD-10-CM | POA: Diagnosis not present

## 2018-12-21 MED ORDER — AMLODIPINE BESYLATE 10 MG PO TABS: 10.0000 mg | ORAL_TABLET | Freq: Every day | ORAL | 3 refills | Status: DC

## 2018-12-21 MED ORDER — METOPROLOL SUCCINATE ER 25 MG PO TB24: 12.5000 mg | ORAL_TABLET | Freq: Every day | ORAL | 3 refills | Status: DC

## 2018-12-21 MED ORDER — RIVAROXABAN 20 MG PO TABS: 20.0000 mg | ORAL_TABLET | Freq: Every day | ORAL | 3 refills | Status: DC

## 2018-12-21 NOTE — Patient Instructions (Addendum)
Medication Instructions:   Please start metoprolol succinate 12.5 mg daily  (Take a 25 mg pill and cut in 1/2 daily)  Monitor blood pressure and heart rate   If you need a refill on your cardiac medications before your next appointment, please call your pharmacy.    Lab work: No new labs needed   If you have labs (blood work) drawn today and your tests are completely normal, you will receive your results only by: Marland Kitchen MyChart Message (if you have MyChart) OR . A paper copy in the mail If you have any lab test that is abnormal or we need to change your treatment, we will call you to review the results.   Testing/Procedures: No new testing needed   Follow-Up: At [facility], you and your health needs are our priority.  As part of our continuing mission to provide you with exceptional heart care, we have created designated Provider Care Teams.  These Care Teams include your primary Cardiologist (physician) and Advanced Practice Providers (APPs -  Physician Assistants and Nurse Practitioners) who all work together to provide you with the care you need, when you need it.  . You will need a follow up appointment in 3 months .   Please call our office 2 months in advance to schedule this appointment.    . Providers on your designated Care Team:   . Murray Hodgkins, NP . Christell Faith, PA-C . Marrianne Mood, PA-C  Any Other Special Instructions Will Be Listed Below (If Applicable).  For educational health videos Log in to : [email or web address] Or : [email or web address], password : [email or web address]

## 2018-12-22 DIAGNOSIS — I10 Essential (primary) hypertension: Secondary | ICD-10-CM | POA: Diagnosis not present

## 2018-12-22 DIAGNOSIS — G4733 Obstructive sleep apnea (adult) (pediatric): Secondary | ICD-10-CM | POA: Diagnosis not present

## 2018-12-24 ENCOUNTER — Other Ambulatory Visit: Payer: Self-pay | Admitting: Pharmacist

## 2018-12-24 NOTE — Patient Outreach (Signed)
[facility]) Care Management  [place]   12/24/2018  Laurie Lovejoy 08-28-1941 [id number]  Reason for referral: Medication Reconciliation Post Discharge Current insurance:[employer]  PMHx: includes but not limited to severe CAD (hx CABG), PAD, renal artery stenosis, atrial fibrillation (CHADSVASs 4), HLD.   Outreach:  Successful telephone call with patient and his wife.  HIPAA identifiers verified.   Subjective:  Patient was recently admitted at [facility] 12/9-12/10/2018 for chest pain, determined to be demand ischemia in the setting of atrial fibrillation w/ RVR, spontaneously converted back to NSR during hospitalization. Left heart cath was performed, no stents placed. However, due to elevated CHADSVASc, patient was placed on Xarelto for anticoagulation and aspirin was discontinued.  At cardiology f/u earlier this week, Dr. Rockey Situ started metoprolol for rate control.   Medications were reviewed with the patient and his wife today, who helps manage his medications. They note that he took metoprolol for one day, but with the "craziness" of Christmas, did not take it for the past two days. They plan to restart and are keeping logs of BP/HR to report to Dr. Rockey Situ. Ms. Carn reports a reading from earlier this week of 125/63 w/ HR of 67, prior to starting metoprolol therapy. They deny any concerns of lightheadedness/dizziness, particularly upon standing.    Objective: Lab Results  Component Value Date   CREATININE 0.79 12/09/2018   CREATININE 0.86 12/07/2018   CREATININE 0.86 04/15/2018    Lab Results  Component Value Date   HGBA1C 5.8 (H) 12/08/2018    Lipid Panel     Component Value Date/Time   CHOL 122 12/08/2018 0309   CHOL 133 04/15/2018 0834   TRIG 73 12/08/2018 0309   HDL 38 (L) 12/08/2018 0309   HDL 36 (L) 04/15/2018 0834   CHOLHDL 3.2 12/08/2018 0309   VLDL 15 12/08/2018 0309   LDLCALC 69 12/08/2018 0309   LDLCALC 73 04/15/2018  0834    BP Readings from Last 3 Encounters:  12/21/18 (!) 148/70  12/16/18 130/60  12/09/18 (!) 138/95    Allergies  Allergen Reactions  . Hydralazine Rash  . Clonidine Derivatives Other (See Comments)    Caused blindness in left eye for the days on the drug  . Adhesive [Tape]     SKIN SENSITIVITY  . Latex Other (See Comments)  . Trazodone Other (See Comments)    Cognitive impairment   . Benazepril Other (See Comments)    Nausea, vomiting and diarrhea  . Carvedilol Other (See Comments)    Blurred vision  . Hydrazine Yellow [Tartrazine] Itching and Rash  . Isosorbide Dinitrate Rash  . Isosorbide Mononitrate Rash  . Lisinopril Other (See Comments)    Blurred vision   . Losartan Other (See Comments)    Blurry vision if doses>25mg . Only use it for his BP is >170    Medications Reviewed Today    Reviewed by De Hollingshead, [facility] (Pharmacist) on 12/24/18 at 1031  Med List Status: <None>  Medication Order Taking? Sig Documenting Provider Last Dose Status Informant  amLODipine (NORVASC) 10 MG tablet [id number] Yes Take 1 tablet (10 mg total) by mouth daily. Minna Merritts, MD Taking Active   atorvastatin (LIPITOR) 80 MG tablet [id number] Yes Take 1 tablet (80 mg total) by mouth daily.  Patient taking differently:  Take 40 mg by mouth daily.    Minna Merritts, MD Taking Active Other  cholecalciferol (VITAMIN D) 1000 units tablet [id number] Yes Take 1,000 Units by mouth  daily. [provider] Taking Active Other  clopidogrel (PLAVIX) 75 MG tablet [id number] Yes Take 1 tablet (75 mg total) by mouth daily. Rosetta Posner, MD Taking Active Other  Coenzyme Q10 (CO Q-10) 100 MG CAPS [id number] Yes Take 100 mg by mouth daily. [provider] Taking Active Other  doxazosin (CARDURA) 8 MG tablet [id number] Yes Take 1 tablet (8 mg total) by mouth 2 (two) times daily. Minna Merritts, MD Taking Active Pharmacy Records  ezetimibe (ZETIA) 10 MG tablet [id number] Yes Take 1  tablet (10 mg total) by mouth daily. Minna Merritts, MD Taking Active Other  Garlic 0459 MG CAPS [id number] Yes Take 7,000 mg by mouth.  [provider] Taking Active Other  hydrochlorothiazide (HYDRODIURIL) 25 MG tablet [id number] Yes Take 1 tablet (25 mg total) by mouth daily. Minna Merritts, MD Taking Active Pharmacy Records  lansoprazole (PREVACID) 15 MG capsule [id number] Yes Take 1 capsule (15 mg total) by mouth daily at 12 noon. Minna Merritts, MD Taking Active Other  levothyroxine (SYNTHROID, LEVOTHROID) 25 MCG tablet [id number] Yes Take 1 tablet (25 mcg total) by mouth daily before breakfast. Carmon Ginsberg, PA Taking Active Pharmacy Records  Melatonin 3 MG TABS [id number] Yes Take 6 mg by mouth at bedtime. [provider] Taking Active         Discontinued 12/24/18 1030 (Change in therapy)   metoprolol succinate (TOPROL-XL) 25 MG 24 hr tablet [id number] Yes Take 0.5 tablets (12.5 mg total) by mouth daily. Take with or immediately following a meal. Gollan, Kathlene November, MD Taking Active   nitroGLYCERIN (NITROLINGUAL) 0.4 MG/SPRAY spray [id number] Yes Place 1 spray under the tongue every 5 (five) minutes x 3 doses as needed for chest pain. Minna Merritts, MD Taking Active Other  polyethylene glycol Navos / GLYCOLAX) packet [id number] Yes Take 17 g by mouth daily. Carmon Ginsberg, PA Taking Active Other  rivaroxaban (XARELTO) 20 MG TABS tablet [id number] Yes Take 1 tablet (20 mg total) by mouth daily with supper. Minna Merritts, MD Taking Active   Zn-Pyg Afri-Nettle-Saw Palmet (SAW PALMETTO COMPLEX PO) [id number] Yes Take 1,725 mg by mouth daily.  [provider] Taking Active Other          Assessment:  Date Discharged from Hospital: 12/09/2018 Date Medication Reconciliation Performed: 12/24/2018  Medications:  New at Discharge: . Amlodipine . Xarelto  Discontinued at Discharge:   Aspirin  Losartan  Patient was recently discharged from hospital  and all medications have been reviewed.  Drugs sorted by system:  Cardiovascular: Amlodipine, atorvastatin, clopidogrel, doxazosin, ezetimibe, HCTZ, metoprolol, Xarelto  Gastrointestinal: lansoprazole  Endocrine: levothyroxine, Miralax   Vitamins/Minerals/Supplements: Vitamin D, coenzyme V72, garlic, melatonin, saw palmetto  Medication Review Findings:  . Medication Adherence: discussed importance of medication adherence; patient plans to start taking metoprolol consistently and report BP/HR readings to Dr. Rockey Situ . Patient denies any affordability concerns. They are aware of the Tier 3 copay for Xarelto and report being able to afford that at this time  Plan: - Will route note to PCP for FYI - Patient has my contact information for any future questions or concerns.   Catie Darnelle Maffucci, PharmD PGY2 Ambulatory Care Pharmacy Resident, [facility] Phone: [phone]

## 2018-12-28 ENCOUNTER — Telehealth: Payer: Self-pay | Admitting: Cardiovascular Disease

## 2018-12-28 NOTE — Telephone Encounter (Signed)
Pt c/o medication issue:  1. Name of Medication: metoprolol succinate   2. How are you currently taking this medication (dosage and times per day)? 0.5 tablets daily   3. Are you having a reaction (difficulty breathing--STAT)? Cannot sleep  4. What is your medication issue? Patient wife calling, states that since patient started the metoprolol he has been unable to sleep.  States that he takes it in the morning and it still affects him.  Does say that the medication has helped to keep pulse rate down. Please call to discuss.

## 2018-12-28 NOTE — Telephone Encounter (Signed)
Spoke with pt and he asked that I speak with his wife.  She states since pt has started Metoprolol he has not been able to sleep much at all.  Pt was taking Metoprolol Succ 12.5mg  QD. Wife has only given him 1/4 of a tablet the last 2 nights and he still can't sleep.  HR has been in the mid 50's.  Advised I will send message to Dr. Rockey Situ for review.

## 2018-12-29 MED ORDER — METOPROLOL TARTRATE 25 MG PO TABS: 12.5000 mg | ORAL_TABLET | Freq: Two times a day (BID) | ORAL | 1 refills | Status: DC

## 2018-12-29 NOTE — Telephone Encounter (Signed)
Pt c/o of Chest Pain: STAT if CP now or developed within 24 hours  1. Are you having CP right now? No   2. Are you experiencing any other symptoms (ex. SOB, nausea, vomiting, sweating)? Teeny pain in chest  3. How long have you been experiencing CP? Today   4. Is your CP continuous or coming and going? One episode   5. Have you taken Nitroglycerin? No recent med change see below patient BP 175/67 and HR 60's- 80's ?

## 2018-12-29 NOTE — Telephone Encounter (Signed)
Returned the call to the patient to inform him of Dr. Donivan Scull verbal recommendations.  Per Dr. Rockey Situ he recommended that the patient discontinue the Metoprolol Succinate and start Metoprolol Tartrate 12.5 mg bid. The medication has been called in for him. He will call back with an update.   If he has the burning sensation again he will go to the ED or urgent care for further assessment.

## 2018-12-29 NOTE — Telephone Encounter (Signed)
Returned the call to the patient. He stated that he was started on Toprol 12.5 mg daily. He stated that once he started the medication he was unable to sleep anymore. After 5 days, he decreased the medication to a quarter of tablet and he has been able to sleep on the quarter tablet.  He did not take the medication this morning and his heart rate went to 81. He stated that he felt a burning in his chest for a minute. He denied chest pain or shortness of breath. He took the quarter tablet of Toprol and his heart rate went down to 61 and he felt better. He has not any symptoms since. His blood pressure on the quarter tablet was 159/75. Message routed to the provider for recommendations.

## 2018-12-31 NOTE — Telephone Encounter (Signed)
Call to patient to see if he is taking new medication as ordered, he reported that he is going to pick it up today and start tomorrow.  He requested that we call him back on Monday to see how he was doing. I agreed to do so.   Advised pt to call for any further questions or concerns

## 2019-01-04 NOTE — Telephone Encounter (Signed)
Returned call this am as pt requested. He was unavailable but wife reported that BP was improved and HR was better. She said they would call with numerical values when he returned from being w daughter during surgery. Overall she said sx were improved.  Advised pt to call for any further questions or concerns

## 2019-01-12 DIAGNOSIS — G4733 Obstructive sleep apnea (adult) (pediatric): Secondary | ICD-10-CM | POA: Diagnosis not present

## 2019-01-12 DIAGNOSIS — I6522 Occlusion and stenosis of left carotid artery: Secondary | ICD-10-CM | POA: Diagnosis not present

## 2019-01-12 DIAGNOSIS — R413 Other amnesia: Secondary | ICD-10-CM | POA: Diagnosis not present

## 2019-01-12 DIAGNOSIS — I739 Peripheral vascular disease, unspecified: Secondary | ICD-10-CM | POA: Diagnosis not present

## 2019-01-12 DIAGNOSIS — F5104 Psychophysiologic insomnia: Secondary | ICD-10-CM | POA: Diagnosis not present

## 2019-01-12 DIAGNOSIS — E559 Vitamin D deficiency, unspecified: Secondary | ICD-10-CM | POA: Diagnosis not present

## 2019-01-18 DIAGNOSIS — N281 Cyst of kidney, acquired: Secondary | ICD-10-CM | POA: Diagnosis not present

## 2019-01-18 DIAGNOSIS — I701 Atherosclerosis of renal artery: Secondary | ICD-10-CM | POA: Diagnosis not present

## 2019-01-22 ENCOUNTER — Other Ambulatory Visit: Payer: Self-pay | Admitting: Cardiovascular Disease

## 2019-01-22 DIAGNOSIS — G4733 Obstructive sleep apnea (adult) (pediatric): Secondary | ICD-10-CM | POA: Diagnosis not present

## 2019-01-22 DIAGNOSIS — I10 Essential (primary) hypertension: Secondary | ICD-10-CM | POA: Diagnosis not present

## 2019-01-22 MED ORDER — METOPROLOL TARTRATE 25 MG PO TABS: 12.5000 mg | ORAL_TABLET | Freq: Two times a day (BID) | ORAL | 3 refills | Status: DC

## 2019-01-22 NOTE — Telephone Encounter (Signed)
°*  STAT* If patient is at the pharmacy, call can be transferred to refill team.   1. Which medications need to be refilled? (please list name of each medication and dose if known)  metoprolol tartrate 25 MG - 0.5 tablets 2 times daily   2. Which pharmacy/location (including street and city if local pharmacy) is medication to be sent to? [place]   3. Do they need a 30 day or 90 day supply? 90 day   Patient wife states that Metoprolol has been working great for patient and HR has been staying in the mid 61s.  Would like to continue.

## 2019-01-22 NOTE — Telephone Encounter (Signed)
Refill sent for Metoprolol 25 mg take half tablet twice a day sent for 90 day supply to [facility] order.

## 2019-01-25 DIAGNOSIS — G4733 Obstructive sleep apnea (adult) (pediatric): Secondary | ICD-10-CM | POA: Diagnosis not present

## 2019-01-25 DIAGNOSIS — I10 Essential (primary) hypertension: Secondary | ICD-10-CM | POA: Diagnosis not present

## 2019-01-28 ENCOUNTER — Ambulatory Visit (INDEPENDENT_AMBULATORY_CARE_PROVIDER_SITE_OTHER): Payer: PPO | Admitting: Family Medicine

## 2019-01-28 ENCOUNTER — Other Ambulatory Visit: Payer: Self-pay | Admitting: Cardiovascular Disease

## 2019-01-28 ENCOUNTER — Encounter: Payer: Self-pay | Admitting: Family Medicine

## 2019-01-28 VITALS — BP 134/58 | HR 65 | Temp 98.0°F | Resp 16 | Wt 190.0 lb

## 2019-01-28 DIAGNOSIS — S6000XA Contusion of unspecified finger without damage to nail, initial encounter: Secondary | ICD-10-CM | POA: Diagnosis not present

## 2019-01-28 NOTE — Progress Notes (Signed)
  Subjective:     Patient ID: Clifford Aguilar, male   DOB: 1941/02/20, 78 y.o.   MRN: [id number] Chief Complaint  Patient presents with  . Hand Pain    Patient comes in office today with concerns of swelling of his knuckle on right index finger. Patient reports history of previous injury to knuckle, patient reports that approx 2-3days he "bumped" his hand and now reports swelling and pain.    HPI States he has had a chronic hard swelling there since he injured it as a teenager. Reports he was working in his yard prior to noticing his finger was sore. Hx of mild dementia and anticoagulant use. Accompanied by his wife today.  Review of Systems     Objective:   Physical Exam Constitutional:      General: He is not in acute distress. Skin:    Comments: Mild swelling/fluctuance with moderate tenderness over his Right second PIP joint. No erythema, pointing or drainage. Can flex without discomfort. No other deformity noted.  Neurological:     Mental Status: He is alert.        Assessment:    1. Traumatic hematoma of finger, initial encounter    Plan:   Discussed elevation of his finger and application of cold compresses with band-aid application of band-aid. Tylenol as needed for pain.

## 2019-01-28 NOTE — Patient Instructions (Signed)
Discussed use of cold compresses and apply band aid. Take Tylenol up to 3000 mg/day. Let me know if not improving over the weekend.

## 2019-01-29 ENCOUNTER — Telehealth: Payer: Self-pay | Admitting: Cardiovascular Disease

## 2019-01-29 NOTE — Telephone Encounter (Signed)
Patient wife reports medication working well.  Please send to Mail order

## 2019-01-29 NOTE — Telephone Encounter (Signed)
Pharmacy calling   1. Name of Medication: Metoprolol tartrate 12.5 mg po BID vs succinate unknown dose   2. How are you currently taking this medication (dosage and times per day)? Clarify   3. Are you having a reaction (difficulty breathing--STAT)? Unknown   4. What is your medication issue? Please call to clarify dose and also confirm patient tolerance due to allergy to carvedilol labetalol

## 2019-01-29 NOTE — Telephone Encounter (Signed)
Attempted to return call to pharmacy, [facility].  I also called pt to verify medication is working well. No answer at this time. LMTCB.

## 2019-01-29 NOTE — Telephone Encounter (Signed)
Spoke with [facility] pharmacist. Clarified that patient is on metoprolol tartrate 12.5 mg two times a day and that though patient has allergy to carvedilol, Dr Rockey Situ is aware and should be able to fill prescription for patient.

## 2019-02-22 ENCOUNTER — Telehealth: Payer: Self-pay

## 2019-02-22 DIAGNOSIS — G4733 Obstructive sleep apnea (adult) (pediatric): Secondary | ICD-10-CM | POA: Diagnosis not present

## 2019-02-22 DIAGNOSIS — I10 Essential (primary) hypertension: Secondary | ICD-10-CM | POA: Diagnosis not present

## 2019-02-22 MED ORDER — HYDROCHLOROTHIAZIDE 25 MG PO TABS: 25.0000 mg | ORAL_TABLET | Freq: Every day | ORAL | 3 refills | Status: DC

## 2019-02-22 NOTE — Telephone Encounter (Signed)
Rx for 90 supply sent to [facility].

## 2019-03-15 ENCOUNTER — Other Ambulatory Visit: Payer: Self-pay | Admitting: Cardiovascular Disease

## 2019-03-23 ENCOUNTER — Telehealth: Payer: Self-pay | Admitting: Cardiovascular Disease

## 2019-03-23 ENCOUNTER — Other Ambulatory Visit: Payer: Self-pay | Admitting: *Deleted

## 2019-03-23 DIAGNOSIS — G4733 Obstructive sleep apnea (adult) (pediatric): Secondary | ICD-10-CM | POA: Diagnosis not present

## 2019-03-23 DIAGNOSIS — I10 Essential (primary) hypertension: Secondary | ICD-10-CM | POA: Diagnosis not present

## 2019-03-23 MED ORDER — EZETIMIBE 10 MG PO TABS: 10.0000 mg | ORAL_TABLET | Freq: Every day | ORAL | 0 refills | Status: DC

## 2019-03-23 NOTE — Telephone Encounter (Signed)
Refill was sent. Also, please call and set patient up for an evisit with Dr Rockey Situ.  Thanks!

## 2019-03-23 NOTE — Telephone Encounter (Signed)
Patient wife calling - was only allowed a 30 day prescription due to needing an appointment Please advise if patient should wait on follow up or if needing an E-visit Would like more refills if possible    *STAT* If patient is at the pharmacy, call can be transferred to refill team.   1. Which medications need to be refilled? (please list name of each medication and dose if known)  ezetimibe (ZETIA) 10 MG 1 tablet daily   2. Which pharmacy/location (including street and city if local pharmacy) is medication to be sent to? [facility]   3. Do they need a 30 day or 90 day supply? 90 day

## 2019-03-23 NOTE — Telephone Encounter (Signed)
Requested Prescriptions  ° °Signed Prescriptions Disp Refills  ° ezetimibe (ZETIA) 10 MG tablet 90 tablet 0  °  Sig: Take 1 tablet (10 mg total) by mouth daily.  °  Authorizing Provider: GOLLAN, TIMOTHY J  °  Ordering User: Garren Greenman C  ° ° °

## 2019-03-23 NOTE — Telephone Encounter (Signed)
Requested Prescriptions  ° °Signed Prescriptions Disp Refills  ° ezetimibe (ZETIA) 10 MG tablet 90 tablet 0  °  Sig: Take 1 tablet (10 mg total) by mouth daily.  °  Authorizing Provider: GOLLAN, TIMOTHY J  °  Ordering User: LOPEZ, MARINA C  ° ° °

## 2019-03-30 ENCOUNTER — Other Ambulatory Visit: Payer: Self-pay

## 2019-03-30 ENCOUNTER — Encounter: Payer: Self-pay | Admitting: Podiatry

## 2019-03-30 ENCOUNTER — Ambulatory Visit (INDEPENDENT_AMBULATORY_CARE_PROVIDER_SITE_OTHER): Payer: PPO | Admitting: Podiatry

## 2019-03-30 VITALS — Temp 98.0°F

## 2019-03-30 DIAGNOSIS — L989 Disorder of the skin and subcutaneous tissue, unspecified: Secondary | ICD-10-CM | POA: Diagnosis not present

## 2019-03-30 DIAGNOSIS — M7751 Other enthesopathy of right foot: Secondary | ICD-10-CM

## 2019-03-30 NOTE — Progress Notes (Signed)
   HPI: Patient presents today for follow-up treatment evaluation regarding right foot pain.  Patient was last seen on 11/03/2017 at which time he complained of symptomatic callus lesions which were trimmed and he says that it feels much better.  Patient has recently experienced a return of the pain to the right foot.  No symptoms on his left foot.  Past Medical History:  Diagnosis Date  . Cancer ([place])    basel cell carcinoma left ear  . Carotid artery occlusion    s/p right carotid endarterectomy  . Chronic kidney disease    renal artery stenosis  . Coronary artery disease    s/p coronary bypass graft surgery in 1992, stable  . GERD (gastroesophageal reflux disease)   . Hyperlipidemia   . Hypertension   . Myocardial infarction ([place])   . PVD (peripheral vascular disease) ([place])    s/p multiple revascularization procedures.  . Shortness of breath      Physical Exam: General: The patient is alert and oriented x3 in no acute distress.  Dermatology: Skin is warm, dry and supple bilateral lower extremities. Negative for open lesions or macerations.  Hyperkeratotic callus tissue with a central nucleated core noted to the sub-fifth MPJ right foot.   Vascular: Palpable pedal pulses bilaterally. No edema or erythema noted. Capillary refill within normal limits.  Neurological: Epicritic and protective threshold grossly intact bilaterally.   Musculoskeletal Exam: Range of motion within normal limits to all pedal and ankle joints bilateral. Muscle strength 5/5 in all groups bilateral.  There is a significant amount of pain on palpation and range of motion also to the fifth MTPJ.  Assessment: 1.  Porokeratosis sub-fifth MPJ right foot 2.  Fifth MTPJ capsulitis right foot   Plan of Care:  1. Patient evaluated.  2.  Excisional debridement of the hyperkeratotic callus tissue was performed using chisel blade without incident or bleeding.  Salicylic acid and a Band-Aid was applied 3.  Injection  of 0.5 cc Celestone Soluspan injection into the right fifth MTPJ 4.  Recommend over-the-counter corn and callus remover 5.  Return to clinic as needed      Edrick Kins, DPM [facility]  Dr. Edrick Kins, DPM    [address], [place] [address]                Office [phone]  Fax [phone]

## 2019-03-30 NOTE — Patient Instructions (Signed)
Corn and Callus Remover

## 2019-04-02 NOTE — Telephone Encounter (Signed)
LMOV to schedule possible evisit follow up

## 2019-04-05 ENCOUNTER — Other Ambulatory Visit: Payer: Self-pay | Admitting: Cardiovascular Disease

## 2019-04-05 NOTE — Telephone Encounter (Signed)
Patient returning call  Patient is more comfortable with an in office visit, wants to wait about 2 weeks to see how the [place] is progressing  If it is going to be much longer patient will consider doing e visit and will call to schedule

## 2019-04-05 NOTE — Telephone Encounter (Signed)
*  STAT* If patient is at the pharmacy, call can be transferred to refill team.   1. Which medications need to be refilled? (please list name of each medication and dose if known) Zetia  2. Which pharmacy/location (including street and city if local pharmacy) is medication to be sent to?[facility]  3. Do they need a 30 day or 90 day supply? [place]

## 2019-04-06 ENCOUNTER — Telehealth: Payer: Self-pay | Admitting: *Deleted

## 2019-04-06 NOTE — Telephone Encounter (Signed)
Pt asked for how long he was to use the wart cream.

## 2019-04-07 NOTE — Telephone Encounter (Signed)
Dr. Amalia Hailey states pt should apply the wart cream daily for 2-3 weeks. Left message informing pt of Dr. Amalia Hailey instructions.

## 2019-04-14 ENCOUNTER — Encounter: Payer: Self-pay | Admitting: Physician Assistant

## 2019-04-19 ENCOUNTER — Encounter: Payer: Self-pay | Admitting: Family Medicine

## 2019-04-23 DIAGNOSIS — G4733 Obstructive sleep apnea (adult) (pediatric): Secondary | ICD-10-CM | POA: Diagnosis not present

## 2019-04-23 DIAGNOSIS — I10 Essential (primary) hypertension: Secondary | ICD-10-CM | POA: Diagnosis not present

## 2019-04-26 ENCOUNTER — Other Ambulatory Visit: Payer: Self-pay | Admitting: Cardiovascular Disease

## 2019-04-26 MED ORDER — DOXAZOSIN MESYLATE 8 MG PO TABS: 8.0000 mg | ORAL_TABLET | Freq: Two times a day (BID) | ORAL | 0 refills | Status: DC

## 2019-04-26 NOTE — Telephone Encounter (Signed)
Refill sent to [facility].90 day supply Doxazosin 8 mg

## 2019-04-26 NOTE — Telephone Encounter (Signed)
°*  STAT* If patient is at the pharmacy, call can be transferred to refill team.   1. Which medications need to be refilled? (please list name of each medication and dose if known) doxazosin 8 MG 1 tablet 2 times daily   2. Which pharmacy/location (including street and city if local pharmacy) is medication to be sent to? [place] [facility]   3. Do they need a 30 day or 90 day supply? 90 day to [facility], patient has 3 left, would like a weeks supply sent to [facility]

## 2019-05-11 ENCOUNTER — Other Ambulatory Visit: Payer: Self-pay

## 2019-05-11 ENCOUNTER — Encounter: Payer: Self-pay | Admitting: Physician Assistant

## 2019-05-11 ENCOUNTER — Ambulatory Visit (INDEPENDENT_AMBULATORY_CARE_PROVIDER_SITE_OTHER): Payer: PPO | Admitting: Physician Assistant

## 2019-05-11 VITALS — BP 144/69 | HR 56 | Temp 97.6°F | Resp 16 | Wt 186.0 lb

## 2019-05-11 DIAGNOSIS — Z860100 Personal history of colon polyps, unspecified: Secondary | ICD-10-CM

## 2019-05-11 DIAGNOSIS — I214 Non-ST elevation (NSTEMI) myocardial infarction: Secondary | ICD-10-CM | POA: Diagnosis not present

## 2019-05-11 DIAGNOSIS — I10 Essential (primary) hypertension: Secondary | ICD-10-CM

## 2019-05-11 DIAGNOSIS — R351 Nocturia: Secondary | ICD-10-CM

## 2019-05-11 DIAGNOSIS — E782 Mixed hyperlipidemia: Secondary | ICD-10-CM | POA: Diagnosis not present

## 2019-05-11 DIAGNOSIS — E039 Hypothyroidism, unspecified: Secondary | ICD-10-CM | POA: Diagnosis not present

## 2019-05-11 DIAGNOSIS — R7303 Prediabetes: Secondary | ICD-10-CM

## 2019-05-11 DIAGNOSIS — I701 Atherosclerosis of renal artery: Secondary | ICD-10-CM | POA: Diagnosis not present

## 2019-05-11 DIAGNOSIS — I480 Paroxysmal atrial fibrillation: Secondary | ICD-10-CM | POA: Diagnosis not present

## 2019-05-11 DIAGNOSIS — Z0000 Encounter for general adult medical examination without abnormal findings: Secondary | ICD-10-CM

## 2019-05-11 NOTE — Patient Instructions (Signed)
Health Maintenance After Age 76 After age 76, you are at a higher risk for certain long-term diseases and infections as well as injuries from falls. Falls are a major cause of broken bones and head injuries in people who are older than age 76. Getting regular preventive care can help to keep you healthy and well. Preventive care includes getting regular testing and making lifestyle changes as recommended by your health care provider. Talk with your health care provider about:  Which screenings and tests you should have. A screening is a test that checks for a disease when you have no symptoms.  A diet and exercise plan that is right for you. What should I know about screenings and tests to prevent falls? Screening and testing are the best ways to find a health problem early. Early diagnosis and treatment give you the best chance of managing medical conditions that are common after age 76. Certain conditions and lifestyle choices may make you more likely to have a fall. Your health care provider may recommend:  Regular vision checks. Poor vision and conditions such as cataracts can make you more likely to have a fall. If you wear glasses, make sure to get your prescription updated if your vision changes.  Medicine review. Work with your health care provider to regularly review all of the medicines you are taking, including over-the-counter medicines. Ask your health care provider about any side effects that may make you more likely to have a fall. Tell your health care provider if any medicines that you take make you feel dizzy or sleepy.  Osteoporosis screening. Osteoporosis is a condition that causes the bones to get weaker. This can make the bones weak and cause them to break more easily.  Blood pressure screening. Blood pressure changes and medicines to control blood pressure can make you feel dizzy.  Strength and balance checks. Your health care provider may recommend certain tests to check your  strength and balance while standing, walking, or changing positions.  Foot health exam. Foot pain and numbness, as well as not wearing proper footwear, can make you more likely to have a fall.  Depression screening. You may be more likely to have a fall if you have a fear of falling, feel emotionally low, or feel unable to do activities that you used to do.  Alcohol use screening. Using too much alcohol can affect your balance and may make you more likely to have a fall. What actions can I take to lower my risk of falls? General instructions  Talk with your health care provider about your risks for falling. Tell your health care provider if: ? You fall. Be sure to tell your health care provider about all falls, even ones that seem minor. ? You feel dizzy, sleepy, or off-balance.  Take over-the-counter and prescription medicines only as told by your health care provider. These include any supplements.  Eat a healthy diet and maintain a healthy weight. A healthy diet includes low-fat dairy products, low-fat (lean) meats, and fiber from whole grains, beans, and lots of fruits and vegetables. Home safety  Remove any tripping hazards, such as rugs, cords, and clutter.  Install safety equipment such as grab bars in bathrooms and safety rails on stairs.  Keep rooms and walkways well-lit. Activity   Follow a regular exercise program to stay fit. This will help you maintain your balance. Ask your health care provider what types of exercise are appropriate for you.  If you need a cane or   walker, use it as recommended by your health care provider.  Wear supportive shoes that have nonskid soles. Lifestyle  Do not drink alcohol if your health care provider tells you not to drink.  If you drink alcohol, limit how much you have: ? 0-1 drink a day for women. ? 0-2 drinks a day for men.  Be aware of how much alcohol is in your drink. In the [place] one drink equals one typical bottle of beer (12  oz), one-half glass of wine (5 oz), or one shot of hard liquor (1 oz).  Do not use any products that contain nicotine or tobacco, such as cigarettes and e-cigarettes. If you need help quitting, ask your health care provider. Summary  Having a healthy lifestyle and getting preventive care can help to protect your health and wellness after age 76.  Screening and testing are the best way to find a health problem early and help you avoid having a fall. Early diagnosis and treatment give you the best chance for managing medical conditions that are more common for people who are older than age 76.  Falls are a major cause of broken bones and head injuries in people who are older than age 76. Take precautions to prevent a fall at home.  Work with your health care provider to learn what changes you can make to improve your health and wellness and to prevent falls. This information is not intended to replace advice given to you by your health care provider. Make sure you discuss any questions you have with your health care provider. Document Released: 10/29/2017 Document Revised: 10/29/2017 Document Reviewed: 10/29/2017 Elsevier Interactive Patient Education  2019 Elsevier Inc.  

## 2019-05-11 NOTE — Progress Notes (Signed)
Patient: Clifford Aguilar, Male    DOB: [birth year], 78 y.o.   MRN: [id number] Visit Date: 05/11/2019  Today's Provider: Trinna Post, PA-C   Chief Complaint  Patient presents with  . Medicare Wellness   Subjective:     Annual wellness visit Clifford Aguilar is a 79 y.o. male. He feels well. He reports exercising some, walking. He reports he is sleeping poorly.  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 Three children, one has deceased child in motorcycle. Two grown daughters, one daughter on dialysis.   Living in [place], [place] with wife of 48 years. Currently retired in 2001, worked for [employer] and [employer].  Myocardial Infarction: He underwent bypass surgery in 1992, carotid endarterectomy on the right with 60-70% residual disease on [place] 2013.  Sees Dr. Rockey Situ for cardiology. He is currently on Xarelto and Plavix due to history of heart disease.   Colonoscopy: 03/12/2016 one 4 mm polyp, repeat 5 years   Prediabetes: He is not on any treatment for this. He reports he is active out in the yard gardening and watches what he eats, especially due to his heart disease.  Lab Results  Component Value Date   HGBA1C 6.0 (H) 05/11/2019   HTN: He has a history of HTN managed with amlodipine 10 mg daily, HCTZ 25 mg daily and lopressor 25 mg BID. He has a history of bilateral renal artery stents for renal artery stenosis. Additionally he is on Cardura 8 mg BID.   HLD: He is currently taking Lipitor 80 mg daily and zetia 10 mg daily.   Lipid Panel     Component Value Date/Time   CHOL 132 05/11/2019 1009   TRIG 115 05/11/2019 1009   HDL 37 (L) 05/11/2019 1009   CHOLHDL 3.6 05/11/2019 1009   CHOLHDL 3.2 12/08/2018 0309   VLDL 15 12/08/2018 0309   LDLCALC 72 05/11/2019 1009   Hypothyroidism: He has a history of hypothyroidism and is currently on 25 mcg synthroid.   Lab Results  Component Value Date   TSH 3.950 05/11/2019     -----------------------------------------------------------  Review of Systems  Constitutional: Negative.   HENT: Negative.   Eyes: Negative.   Respiratory: Negative.   Cardiovascular: Positive for leg swelling.  Gastrointestinal: Negative.   Endocrine: Negative.   Genitourinary: Negative.   Musculoskeletal: Negative.   Skin: Negative.   Allergic/Immunologic: Negative.   Neurological: Negative.   Hematological: Negative.   Psychiatric/Behavioral: Negative.     Social History   Socioeconomic History  . Marital status: Married    Spouse name: Not on file  . Number of children: Not on file  . Years of education: Not on file  . Highest education level: Not on file  Occupational History  . Not on file  Social Needs  . Financial resource strain: Not on file  . Food insecurity:    Worry: Not on file    Inability: Not on file  . Transportation needs:    Medical: Not on file    Non-medical: Not on file  Tobacco Use  . Smoking status: Former Smoker    Types: Cigarettes    Last attempt to quit: 12/30/1990    Years since quitting: 28.3  . Smokeless tobacco: Former Systems developer    Quit date: 01/10/1991  . Tobacco comment: quit smoking in 1992  Substance and Sexual Activity  . Alcohol use: Yes    Alcohol/week: 1.0 - 2.0 standard drinks  Types: 1 - 2 Glasses of wine per week    Comment: 1-2 a week  . Drug use: No  . Sexual activity: Yes  Lifestyle  . Physical activity:    Days per week: Not on file    Minutes per session: Not on file  . Stress: Not on file  Relationships  . Social connections:    Talks on phone: Not on file    Gets together: Not on file    Attends religious service: Not on file    Active member of club or organization: Not on file    Attends meetings of clubs or organizations: Not on file    Relationship status: Not on file  . Intimate partner violence:    Fear of current or ex partner: Not on file    Emotionally abused: Not on file    Physically abused:  Not on file    Forced sexual activity: Not on file  Other Topics Concern  . Not on file  Social History Narrative  . Not on file    Past Medical History:  Diagnosis Date  . Cancer ([place])    basel cell carcinoma left ear  . Carotid artery occlusion    s/p right carotid endarterectomy  . Chronic kidney disease    renal artery stenosis  . Coronary artery disease    s/p coronary bypass graft surgery in 1992, stable  . GERD (gastroesophageal reflux disease)   . Hyperlipidemia   . Hypertension   . Myocardial infarction ([place])   . PVD (peripheral vascular disease) ([place])    s/p multiple revascularization procedures.  . Shortness of breath      Patient Active Problem List   Diagnosis Date Noted  . Demand ischemia ([place])   . NSTEMI (non-ST elevated myocardial infarction) ([place]) 12/08/2018  . Paroxysmal atrial fibrillation ([facility])   . Renal cyst, left 03/30/2018  . Mixed Alzheimer's and vascular dementia ([place]) 02/27/2018  . Personal history of colonic polyps   . Benign neoplasm of ascending colon   . Hypothyroidism 01/29/2016  . Insomnia 01/29/2016  . GERD (gastroesophageal reflux disease) 01/29/2016  . Hyperlipidemia 04/27/2014  . History of renal angiogram 03/16/2014  . Decreased libido 10/22/2013  . Hypertrophy of prostate with urinary obstruction and other lower urinary tract symptoms (LUTS) 10/22/2013  . Hypogonadism, testicular 10/22/2013  . Renal artery stenosis ([place]) 07/27/2013  . Basal cell carcinoma of face 04/27/2013  . HTN (hypertension) 09/05/2011  . CAD, AUTOLOGOUS BYPASS GRAFT 03/27/2009  . Carotid artery stenosis 03/27/2009  . Coronary atherosclerosis 03/25/2009  . PAD (peripheral artery disease) ([place]) 03/25/2009    Past Surgical History:  Procedure Laterality Date  . abdomial angiogram  03-16-2014  . ABDOMINAL ANGIOGRAM N/A 03/16/2014   Procedure: ABDOMINAL ANGIOGRAM;  Surgeon: Rosetta Posner, MD;  Location: [facility] CATH LAB;  Service: Cardiovascular;  Laterality:  N/A;  . ABDOMINAL AORTAGRAM N/A 12/03/2011   Procedure: ABDOMINAL AORTAGRAM;  Surgeon: Serafina Mitchell, MD;  Location: [facility] CATH LAB;  Service: Cardiovascular;  Laterality: N/A;  . CAROTID ENDARTERECTOMY     Right. With 46% stenosis in the right by recent Doppler  . COLONOSCOPY WITH PROPOFOL N/A 03/12/2016   Procedure: COLONOSCOPY WITH PROPOFOL;  Surgeon: [name], MD;  Location: [facility] ENDOSCOPY;  Service: Endoscopy;  Laterality: N/A;  . CORONARY ARTERY BYPASS GRAFT  1992  . left common iliac  12/03/2011  . LEFT HEART CATH AND CORS/GRAFTS ANGIOGRAPHY Left 12/08/2018   Procedure: LEFT HEART CATH AND CORS/GRAFTS ANGIOGRAPHY;  Surgeon: Nelva Bush, MD;  Location: [facility] CV LAB;  Service: Cardiovascular;  Laterality: Left;  . Peripheral vascular revascularization     . RENAL ANGIOGRAM  03-16-2014  . RENAL ANGIOGRAM Left 12/03/2011   Procedure: RENAL ANGIOGRAM;  Surgeon: Serafina Mitchell, MD;  Location: [facility] CATH LAB;  Service: Cardiovascular;  Laterality: Left;  lt renal artery stent  . RENAL ANGIOGRAM N/A 03/16/2014   Procedure: RENAL ANGIOGRAM;  Surgeon: Rosetta Posner, MD;  Location: [facility] CATH LAB;  Service: Cardiovascular;  Laterality: N/A;  . renal artery stenosis  12/03/2011  . SHOULDER ARTHROSCOPY     Right Amnioplasty; Acromioclavicular joint resection and arthroscopic debridement  . Surgery for Sleep Apnea      His family history includes Heart disease in an other family member; Stroke in his brother, mother, and sister.   Current Outpatient Medications:  .  amLODipine (NORVASC) 10 MG tablet, Take 1 tablet (10 mg total) by mouth daily., Disp: 90 tablet, Rfl: 3 .  atorvastatin (LIPITOR) 80 MG tablet, Take 1 tablet (80 mg total) by mouth daily. (Patient taking differently: Take 40 mg by mouth daily. ), Disp: 90 tablet, Rfl: 4 .  cholecalciferol (VITAMIN D) 1000 units tablet, Take 1,000 Units by mouth daily., Disp: , Rfl:  .  clopidogrel (PLAVIX) 75 MG tablet, Take 1 tablet (75 mg  total) by mouth daily., Disp: 90 tablet, Rfl: 3 .  Coenzyme Q10 (CO Q-10) 100 MG CAPS, Take 100 mg by mouth daily., Disp: , Rfl:  .  doxazosin (CARDURA) 8 MG tablet, Take 1 tablet (8 mg total) by mouth 2 (two) times daily., Disp: 180 tablet, Rfl: 0 .  ezetimibe (ZETIA) 10 MG tablet, Take 1 tablet (10 mg total) by mouth daily., Disp: 90 tablet, Rfl: 0 .  Garlic 5638 MG CAPS, Take 7,000 mg by mouth. , Disp: , Rfl:  .  hydrochlorothiazide (HYDRODIURIL) 25 MG tablet, Take 1 tablet (25 mg total) by mouth daily., Disp: 90 tablet, Rfl: 3 .  lansoprazole (PREVACID) 15 MG capsule, Take 1 capsule (15 mg total) by mouth daily at 12 noon., Disp: 30 capsule, Rfl: 6 .  levothyroxine (SYNTHROID, LEVOTHROID) 25 MCG tablet, Take 1 tablet (25 mcg total) by mouth daily before breakfast., Disp: 90 tablet, Rfl: 3 .  Melatonin 3 MG TABS, Take 6 mg by mouth at bedtime., Disp: , Rfl:  .  metoprolol tartrate (LOPRESSOR) 25 MG tablet, TAKE ONE-HALF TABLET BY MOUTH TWICE DAILY, Disp: 30 tablet, Rfl: 3 .  nitroGLYCERIN (NITROLINGUAL) 0.4 MG/SPRAY spray, Place 1 spray under the tongue every 5 (five) minutes x 3 doses as needed for chest pain., Disp: 12 g, Rfl: 3 .  polyethylene glycol (MIRALAX / GLYCOLAX) packet, Take 17 g by mouth daily., Disp: 90 packet, Rfl: 3 .  rivaroxaban (XARELTO) 20 MG TABS tablet, Take 1 tablet (20 mg total) by mouth daily with supper., Disp: 90 tablet, Rfl: 3 .  Zn-Pyg Afri-Nettle-Saw Palmet (SAW PALMETTO COMPLEX PO), Take 1,725 mg by mouth daily. , Disp: , Rfl:   Patient Care Team: Paulene Floor as PCP - General (Physician Assistant) Minna Merritts, MD as PCP - Cardiology (Cardiology) Minna Merritts, MD as Attending Physician (Cardiology) Little, Claudette Stapler, RN as [place] Management    Objective:    Vitals: BP (!) 144/69 (BP Location: Left Arm, Patient Position: Sitting, Cuff Size: Large)   Pulse (!) 56   Temp 97.6 F (36.4 C) (Oral)   Resp 16  Wt 186 lb  (84.4 kg)   BMI 27.47 kg/m   Physical Exam Constitutional:      Appearance: Normal appearance.  HENT:     Head: Normocephalic and atraumatic.     Right Ear: Tympanic membrane, ear canal and external ear normal.     Left Ear: Tympanic membrane, ear canal and external ear normal.     Nose: Nose normal.     Mouth/Throat:     Mouth: Mucous membranes are moist.     Pharynx: Oropharynx is clear.  Eyes:     Extraocular Movements: Extraocular movements intact.     Conjunctiva/sclera: Conjunctivae normal.     Pupils: Pupils are equal, round, and reactive to light.  Neck:     Musculoskeletal: Normal range of motion and neck supple.  Cardiovascular:     Pulses: Normal pulses.     Heart sounds: Normal heart sounds.  Pulmonary:     Effort: Pulmonary effort is normal.     Breath sounds: Normal breath sounds.  Abdominal:     General: Bowel sounds are normal.  Musculoskeletal: Normal range of motion.  Skin:    General: Skin is warm and dry.  Neurological:     Mental Status: He is alert and oriented to person, place, and time.  Psychiatric:        Mood and Affect: Mood normal.        Behavior: Behavior normal.        Thought Content: Thought content normal.        Judgment: Judgment normal.     Activities of Daily Living In your present state of health, do you have any difficulty performing the following activities: 05/11/2019 12/07/2018  Hearing? [place]? N -  Difficulty concentrating or making decisions? N -  Walking or climbing stairs? Y -  Dressing or bathing? N -  Doing errands, shopping? N N  Some recent data might be hidden    Fall Risk Assessment Fall Risk  05/11/2019 04/14/2018 03/13/2017 01/29/2016  Falls in the past year? 0 Yes No No  Number falls in past yr: - 2 or more - -  Injury with Fall? - Yes - -  Follow up - Follow up appointment - -     Depression Screen PHQ 2/9 Scores 05/11/2019 04/14/2018 03/13/2017 01/29/2016  PHQ - 2 Score 0 0 0 0    Cognitive  Testing - 6-CIT  Correct? Score   What year is it? yes 0 0 or 4  What month is it? yes 0 0 or 3  Memorize:    Pia Mau,  44,  [address],  [place],      What time is it? (within 1 hour) yes 0 0 or 3  Count backwards from 20 yes 0 0, 2, or 4  Name the months of the year no 2 0, 2, or 4  Repeat name & address above no 1 0, 2, 4, 6, 8, or 10       TOTAL SCORE  3/28   Interpretation:  Normal  Normal (0-7) Abnormal (8-28)      Assessment & Plan:     Annual Wellness Visit  Reviewed patient's Family Medical History Reviewed and updated list of patient's medical providers Assessment of cognitive impairment was done Assessed patient's functional ability Established a written schedule for health screening [place] Completed and Reviewed  Exercise Activities and Dietary recommendations Goals   None     Immunization History  Administered Date(s) Administered  . Influenza Whole 10/05/2012  . Influenza,inj,Quad PF,6+ Mos 10/01/2017  . Influenza-Unspecified 09/29/2013, 09/16/2014, 09/11/2015, 10/27/2018  . Pneumococcal Conjugate-13 11/21/2014  . Pneumococcal Polysaccharide-23 01/09/2012, 04/21/2018  . Td 01/29/2016  . Tdap 03/14/2006  . Zoster 03/26/2007  . Zoster Recombinat (Shingrix) 09/04/2018    Health Maintenance  Topic Date Due  . INFLUENZA VACCINE  07/31/2019  . COLONOSCOPY  03/12/2021  . TETANUS/TDAP  01/28/2026  . PNA vac Low Risk Adult  Completed     Discussed health benefits of physical activity, and encouraged him to engage in regular exercise appropriate for his age and condition.    1. Annual physical exam   2. NSTEMI (non-ST elevated myocardial infarction) ([place])  - Lipid Profile  3. Paroxysmal atrial fibrillation ([facility])  - Lipid Profile  4. Personal history of colonic polyps   5. Mixed hyperlipidemia   6. Essential hypertension  - CBC with Differential - Comprehensive Metabolic Panel (CMET)  7. Renal artery  stenosis ([place])   8. Prediabetes  - HgB A1c  9. Hypothyroidism, unspecified type  - TSH  10. Nocturia  - PSA  The entirety of the information documented in the History of Present Illness, Review of Systems and Physical Exam were personally obtained by me. Portions of this information were initially documented by Jennings Books, CMA and reviewed by me for thoroughness and accuracy.   F/u 1 year CPE   ------------------------------------------------------------------------------------------------------------    Trinna Post, PA-C  [facility]

## 2019-05-12 LAB — LIPID PANEL
Chol/HDL Ratio: 3.6 ratio (ref 0.0–5.0)
Cholesterol, Total: 132 mg/dL (ref 100–199)
HDL: 37 mg/dL — ABNORMAL LOW (ref >39)
LDL Calculated: 72 mg/dL (ref 0–99)
Triglycerides: 115 mg/dL (ref 0–149)
VLDL Cholesterol Cal: 23 mg/dL (ref 5–40)

## 2019-05-12 LAB — CBC WITH DIFFERENTIAL/PLATELET
Basophils Absolute: 0 10*3/uL (ref 0.0–0.2)
Basos: 1 %
EOS (ABSOLUTE): 0.1 10*3/uL (ref 0.0–0.4)
Eos: 1 %
Hematocrit: 35.3 % — ABNORMAL LOW (ref 37.5–51.0)
Hemoglobin: 12 g/dL — ABNORMAL LOW (ref 13.0–17.7)
Immature Grans (Abs): 0 10*3/uL (ref 0.0–0.1)
Immature Granulocytes: 0 %
Lymphocytes Absolute: 1.4 10*3/uL (ref 0.7–3.1)
Lymphs: 21 %
MCH: 30.2 pg (ref 26.6–33.0)
MCHC: 34 g/dL (ref 31.5–35.7)
MCV: 89 fL (ref 79–97)
Monocytes Absolute: 0.7 10*3/uL (ref 0.1–0.9)
Monocytes: 10 %
Neutrophils Absolute: 4.5 10*3/uL (ref 1.4–7.0)
Neutrophils: 67 %
Platelets: 194 10*3/uL (ref 150–450)
RBC: 3.97 x10E6/uL — ABNORMAL LOW (ref 4.14–5.80)
RDW: 12.6 % (ref 11.6–15.4)
WBC: 6.7 10*3/uL (ref 3.4–10.8)

## 2019-05-12 LAB — HEMOGLOBIN A1C
Est. average glucose Bld gHb Est-mCnc: 126 mg/dL
Hgb A1c MFr Bld: 6 % — ABNORMAL HIGH (ref 4.8–5.6)

## 2019-05-12 LAB — COMPREHENSIVE METABOLIC PANEL
ALT: 17 IU/L (ref 0–44)
AST: 17 IU/L (ref 0–40)
Albumin/Globulin Ratio: 1.7 (ref 1.2–2.2)
Albumin: 4 g/dL (ref 3.7–4.7)
Alkaline Phosphatase: 82 IU/L (ref 39–117)
BUN/Creatinine Ratio: 21 (ref 10–24)
BUN: 17 mg/dL (ref 8–27)
Bilirubin Total: 0.5 mg/dL (ref 0.0–1.2)
CO2: 24 mmol/L (ref 20–29)
Calcium: 9.6 mg/dL (ref 8.6–10.2)
Chloride: 102 mmol/L (ref 96–106)
Creatinine, Ser: 0.81 mg/dL (ref 0.76–1.27)
GFR calc Af Amer: 101 mL/min/{1.73_m2} (ref >59)
GFR calc non Af Amer: 87 mL/min/{1.73_m2} (ref >59)
Globulin, Total: 2.3 g/dL (ref 1.5–4.5)
Glucose: 116 mg/dL — ABNORMAL HIGH (ref 65–99)
Potassium: 5 mmol/L (ref 3.5–5.2)
Sodium: 140 mmol/L (ref 134–144)
Total Protein: 6.3 g/dL (ref 6.0–8.5)

## 2019-05-12 LAB — TSH: TSH: 3.95 u[IU]/mL (ref 0.450–4.500)

## 2019-05-12 LAB — PSA: Prostate Specific Ag, Serum: 0.7 ng/mL (ref 0.0–4.0)

## 2019-05-13 ENCOUNTER — Telehealth: Payer: Self-pay

## 2019-05-13 DIAGNOSIS — Z85828 Personal history of other malignant neoplasm of skin: Secondary | ICD-10-CM | POA: Diagnosis not present

## 2019-05-13 DIAGNOSIS — Z08 Encounter for follow-up examination after completed treatment for malignant neoplasm: Secondary | ICD-10-CM | POA: Diagnosis not present

## 2019-05-13 DIAGNOSIS — L821 Other seborrheic keratosis: Secondary | ICD-10-CM | POA: Diagnosis not present

## 2019-05-13 DIAGNOSIS — L570 Actinic keratosis: Secondary | ICD-10-CM | POA: Diagnosis not present

## 2019-05-13 DIAGNOSIS — X32XXXA Exposure to sunlight, initial encounter: Secondary | ICD-10-CM | POA: Diagnosis not present

## 2019-05-13 NOTE — Telephone Encounter (Signed)
Labs added to labs drawn. KW

## 2019-05-13 NOTE — Telephone Encounter (Signed)
-----   Message from Trinna Post, [place] sent at 05/12/2019 11:15 AM EDT ----- Can we add folate, B12, and Fe+TIBC+Fer under diagnosis of anemia please?

## 2019-05-17 LAB — IRON AND TIBC
Iron Saturation: 16 % (ref 15–55)
Iron: 57 ug/dL (ref 38–169)
Total Iron Binding Capacity: 361 ug/dL (ref 250–450)
UIBC: 304 ug/dL (ref 111–343)

## 2019-05-17 LAB — VITAMIN B12: Vitamin B-12: 584 pg/mL (ref 232–1245)

## 2019-05-17 LAB — SPECIMEN STATUS REPORT

## 2019-05-17 LAB — FERRITIN: Ferritin: 40 ng/mL (ref 30–400)

## 2019-05-19 NOTE — Telephone Encounter (Signed)
Called patient   No answer   No VM

## 2019-05-20 ENCOUNTER — Telehealth: Payer: Self-pay

## 2019-05-20 ENCOUNTER — Other Ambulatory Visit: Payer: Self-pay | Admitting: Vascular Surgery

## 2019-05-20 DIAGNOSIS — I739 Peripheral vascular disease, unspecified: Secondary | ICD-10-CM

## 2019-05-20 NOTE — Telephone Encounter (Signed)
-----   Message from Trinna Post, [place] sent at 05/18/2019  1:14 PM EDT ----- A1c is in prediabetic range so watch the sugars and keep exercising. Hemoglobin is a little low but follow up labs are normal. We can continue to monitor this.

## 2019-05-23 DIAGNOSIS — I10 Essential (primary) hypertension: Secondary | ICD-10-CM | POA: Diagnosis not present

## 2019-05-23 DIAGNOSIS — G4733 Obstructive sleep apnea (adult) (pediatric): Secondary | ICD-10-CM | POA: Diagnosis not present

## 2019-05-27 DIAGNOSIS — G4733 Obstructive sleep apnea (adult) (pediatric): Secondary | ICD-10-CM | POA: Diagnosis not present

## 2019-05-27 DIAGNOSIS — I10 Essential (primary) hypertension: Secondary | ICD-10-CM | POA: Diagnosis not present

## 2019-05-28 ENCOUNTER — Other Ambulatory Visit: Payer: Self-pay

## 2019-06-04 ENCOUNTER — Other Ambulatory Visit: Payer: Self-pay | Admitting: Cardiovascular Disease

## 2019-06-04 NOTE — Telephone Encounter (Signed)
Called patient to verify how he was currently taking this medication.  He stated that he is currently taking Atorvastatin 80MG  1/2 tablet daily.   Per Dr. Gwenyth Ober last office note he wanted him to continue taking Atorvastatin 80MG  daily.   Please advise.

## 2019-06-04 NOTE — Telephone Encounter (Signed)
Spoke with patient. He is taking atorvastatin 40 mg daily. It is noted in his last 2 office visits that he is taking 40 mg instead of the prescribed 80 mg.  Has upcoming appointment with Dr Rockey Situ in July. Routing to Dr Rockey Situ to let him know patient decreased to 40 mg daily.

## 2019-06-10 ENCOUNTER — Telehealth: Payer: Self-pay

## 2019-06-10 NOTE — Telephone Encounter (Signed)
If he is not feeling dizzy, passing out, profusely bleeding or filling the toilet bowl with blood, double book him as a telephone visit tomorrow at 10:00 AM

## 2019-06-10 NOTE — Telephone Encounter (Signed)
Patients wife called requesting an appointment. For the past 6 months patient has had abdominal pain and vomiting. Yesterday he noticed some black stools. Patient tried calling [facility] for an appointment, but they told him that he needs a referral from his PCP. Patient denies any fever. There were no openings for tomorrow with Fabio Bering. I scheduled patient an appointment for Monday 06/14/2019 at 2:40pm. Please advise if ok to keep appointment in the office, or if patient needs virtual visit.

## 2019-06-10 NOTE — Telephone Encounter (Signed)
Appt scheduled. Patient wife advised.

## 2019-06-11 ENCOUNTER — Ambulatory Visit (INDEPENDENT_AMBULATORY_CARE_PROVIDER_SITE_OTHER): Payer: PPO | Admitting: Physician Assistant

## 2019-06-11 DIAGNOSIS — D649 Anemia, unspecified: Secondary | ICD-10-CM | POA: Diagnosis not present

## 2019-06-11 DIAGNOSIS — R195 Other fecal abnormalities: Secondary | ICD-10-CM

## 2019-06-11 DIAGNOSIS — R109 Unspecified abdominal pain: Secondary | ICD-10-CM | POA: Diagnosis not present

## 2019-06-11 NOTE — Progress Notes (Signed)
Patient: Clifford Aguilar Male    DOB: [birth year]   78 y.o.   MRN: [id number] Visit Date: 06/11/2019  Today's Provider: Trinna Post, PA-C   Chief Complaint  Patient presents with  . Abdominal Pain   Subjective:    Virtual Visit via Video Note  I connected with Clifford Aguilar on 06/11/19 at 10:00 AM EDT by a video enabled telemedicine application and verified that I am speaking with the correct person using two identifiers.   I discussed the limitations of evaluation and management by telemedicine and the availability of in person appointments. The patient expressed understanding and agreed to proceed.  Patient location: home Provider location: [place] office  Persons involved in the visit: patient, provider   Reports history of abdominal pain for 6 months. He reports he has stomach churning and also dark stool. Not taking any NSAIDs. Reports pain in the pit of his stomach. Denies vomiting.   Abdominal Pain The current episode started more than 1 month ago. The problem occurs every several days. The problem has been gradually worsening. The pain is at a severity of 6/10. The pain is mild. The quality of the pain is cramping, aching and a sensation of fullness. Associated symptoms include diarrhea, nausea and vomiting.   Stool has been dark for the last 2 -3 months. Patient is not taking his Amlodipine 10 MG to see if it would help his stomach.   Last colonoscopy 2017, some polyps and an ulcer. Last CBC 04/2019 showed mild anemia but normal iron.   CBC Latest Ref Rng & Units 05/11/2019 12/09/2018 12/07/2018  WBC 3.4 - 10.8 x10E3/uL 6.7 6.3 6.4  Hemoglobin 13.0 - 17.7 g/dL 12.0(L) 12.9(L) 12.8(L)  Hematocrit 37.5 - 51.0 % 35.3(L) 39.1 39.1  Platelets 150 - 450 x10E3/uL 194 212 219   Lab Results  Component Value Date   IRON 57 05/11/2019   TIBC 361 05/11/2019   FERRITIN 40 05/11/2019     Allergies  Allergen Reactions  . Hydralazine Rash  .  Clonidine Derivatives Other (See Comments)    Caused blindness in left eye for the days on the drug  . Adhesive [Tape]     SKIN SENSITIVITY  . Latex Other (See Comments)  . Trazodone Other (See Comments)    Cognitive impairment   . Benazepril Other (See Comments)    Nausea, vomiting and diarrhea  . Carvedilol Other (See Comments)    Blurred vision  . Hydrazine Yellow [Tartrazine] Itching and Rash  . Isosorbide Dinitrate Rash  . Isosorbide Mononitrate Rash  . Lisinopril Other (See Comments)    Blurred vision   . Losartan Other (See Comments)    Blurry vision if doses>25mg . Only use it for his BP is >170     Current Outpatient Medications:  .  atorvastatin (LIPITOR) 40 MG tablet, Take 1 tablet (40 mg total) by mouth daily at 6 PM., Disp: 90 tablet, Rfl: 0 .  cholecalciferol (VITAMIN D) 1000 units tablet, Take 1,000 Units by mouth daily., Disp: , Rfl:  .  clopidogrel (PLAVIX) 75 MG tablet, TAKE 1 TABLET DAILY, Disp: 90 tablet, Rfl: 3 .  Coenzyme Q10 (CO Q-10) 100 MG CAPS, Take 100 mg by mouth daily., Disp: , Rfl:  .  doxazosin (CARDURA) 8 MG tablet, Take 1 tablet (8 mg total) by mouth 2 (two) times daily., Disp: 180 tablet, Rfl: 0 .  ezetimibe (ZETIA) 10 MG tablet, Take 1 tablet (10 mg total)  by mouth daily., Disp: 90 tablet, Rfl: 0 .  Garlic 0272 MG CAPS, Take 7,000 mg by mouth. , Disp: , Rfl:  .  hydrochlorothiazide (HYDRODIURIL) 25 MG tablet, Take 1 tablet (25 mg total) by mouth daily., Disp: 90 tablet, Rfl: 3 .  lansoprazole (PREVACID) 15 MG capsule, Take 1 capsule (15 mg total) by mouth daily at 12 noon., Disp: 30 capsule, Rfl: 6 .  levothyroxine (SYNTHROID, LEVOTHROID) 25 MCG tablet, Take 1 tablet (25 mcg total) by mouth daily before breakfast., Disp: 90 tablet, Rfl: 3 .  Melatonin 3 MG TABS, Take 6 mg by mouth at bedtime., Disp: , Rfl:  .  metoprolol tartrate (LOPRESSOR) 25 MG tablet, TAKE ONE-HALF TABLET BY MOUTH TWICE DAILY, Disp: 30 tablet, Rfl: 3 .  nitroGLYCERIN  (NITROLINGUAL) 0.4 MG/SPRAY spray, Place 1 spray under the tongue every 5 (five) minutes x 3 doses as needed for chest pain., Disp: 12 g, Rfl: 3 .  polyethylene glycol (MIRALAX / GLYCOLAX) packet, Take 17 g by mouth daily., Disp: 90 packet, Rfl: 3 .  rivaroxaban (XARELTO) 20 MG TABS tablet, Take 1 tablet (20 mg total) by mouth daily with supper., Disp: 90 tablet, Rfl: 3 .  Zn-Pyg Afri-Nettle-Saw Palmet (SAW PALMETTO COMPLEX PO), Take 1,725 mg by mouth daily. , Disp: , Rfl:  .  amLODipine (NORVASC) 10 MG tablet, Take 1 tablet (10 mg total) by mouth daily. (Patient not taking: Reported on 06/11/2019), Disp: 90 tablet, Rfl: 3  Review of Systems  Constitutional: Negative.   Gastrointestinal: Positive for abdominal pain, diarrhea, nausea and vomiting.  Genitourinary: Negative.     Social History   Tobacco Use  . Smoking status: Former Smoker    Types: Cigarettes    Quit date: 12/30/1990    Years since quitting: 28.4  . Smokeless tobacco: Former Systems developer    Quit date: 01/10/1991  . Tobacco comment: quit smoking in 1992  Substance Use Topics  . Alcohol use: Yes    Alcohol/week: 1.0 - 2.0 standard drinks    Types: 1 - 2 Glasses of wine per week    Comment: 1-2 a week      Objective:   There were no vitals taken for this visit. There were no vitals filed for this visit.   Physical Exam      Assessment & Plan    1. Anemia, unspecified type  Will refer to GI as below. Counseled on return precautions.   - Ambulatory referral to Gastroenterology  2. Dark stools  - Ambulatory referral to Gastroenterology  3. Abdominal pain, unspecified abdominal location  - Ambulatory referral to Gastroenterology  The entirety of the information documented in the History of Present Illness, Review of Systems and Physical Exam were personally obtained by me. Portions of this information were initially documented by [facility], CMA and reviewed by me for thoroughness and accuracy.   F/u PRN       Trinna Post, PA-C  [facility]

## 2019-06-11 NOTE — Patient Instructions (Signed)
Gastrointestinal Bleeding ° °Gastrointestinal bleeding is bleeding somewhere along the path food travels through the body (digestive tract). This path is anywhere between the mouth and the opening of the butt (anus). You may have blood in your poop (stools) or have black poop. If you throw up (vomit), there may be blood in it. °This condition can be mild, serious, or even life-threatening. If you have a lot of bleeding, you may need to stay in the hospital. °Follow these instructions at home: °· Take over-the-counter and prescription medicines only as told by your doctor. °· Eat foods that have a lot of fiber in them. These foods include whole grains, fruits, and vegetables. You can also try eating 1-3 prunes each day. °· Drink enough fluid to keep your pee (urine) clear or pale yellow. °· Keep all follow-up visits as told by your doctor. This is important. °Contact a doctor if: °· Your symptoms do not get better. °Get help right away if: °· Your bleeding gets worse. °· You feel dizzy or you pass out (faint). °· You feel weak. °· You have very bad cramps in your back or belly (abdomen). °· You pass large clumps of blood (clots) in your poop. °· Your symptoms are getting worse. °This information is not intended to replace advice given to you by your health care provider. Make sure you discuss any questions you have with your health care provider. °Document Released: 09/24/2008 Document Revised: 05/23/2016 Document Reviewed: 06/05/2015 °Elsevier Interactive Patient Education © 2019 [employer]vier Inc. ° °

## 2019-06-12 DIAGNOSIS — S90911A Unspecified superficial injury of right ankle, initial encounter: Secondary | ICD-10-CM | POA: Diagnosis not present

## 2019-06-12 DIAGNOSIS — R2241 Localized swelling, mass and lump, right lower limb: Secondary | ICD-10-CM | POA: Diagnosis not present

## 2019-06-12 DIAGNOSIS — W06XXXA Fall from bed, initial encounter: Secondary | ICD-10-CM | POA: Diagnosis not present

## 2019-06-12 DIAGNOSIS — M25571 Pain in right ankle and joints of right foot: Secondary | ICD-10-CM | POA: Diagnosis not present

## 2019-06-14 ENCOUNTER — Ambulatory Visit: Payer: Self-pay | Admitting: Physician Assistant

## 2019-06-15 ENCOUNTER — Telehealth: Payer: Self-pay

## 2019-06-15 NOTE — Telephone Encounter (Signed)
Called patient to follow up about message left on 06/12/2019 by on call service. Patient's spouse stated that he is doing much better. He went to urgent care for an evaluation and treatment of the ankle. No follow up needed.

## 2019-06-21 ENCOUNTER — Telehealth: Payer: Self-pay | Admitting: Physician Assistant

## 2019-06-21 NOTE — Chronic Care Management (AMB) (Signed)
Chronic Care Management   Note  06/21/2019 Name: Clifford Aguilar MRN: [id number] DOB: April 13, 1941  Clifford Aguilar is a 78 y.o. year old male who is a primary care patient of Trinna Post, [place]. I reached out to Clifford Aguilar by phone today in response to a referral sent by [name]'s health plan.    [name] was given information about Chronic Care Management services today including:  1. CCM service includes personalized support from designated clinical staff supervised by his physician, including individualized plan of care and coordination with other care providers 2. 24/7 contact phone numbers for assistance for urgent and routine care needs. 3. Service will only be billed when office clinical staff spend 20 minutes or more in a month to coordinate care. 4. Only one practitioner may furnish and bill the service in a calendar month. 5. The patient may stop CCM services at any time (effective at the end of the month) by phone call to the office staff. 6. The patient will be responsible for cost sharing (co-pay) of up to 20% of the service fee (after annual deductible is met).  Patient did not agree to enrollment in care management services and does not wish to consider at this time.  Follow up plan: The care management team is available to follow up with the patient after provider conversation with the patient regarding recommendation for care management engagement and subsequent re-referral to the care management team.   [place]  [email or web address]   ??[phone]

## 2019-06-23 DIAGNOSIS — I10 Essential (primary) hypertension: Secondary | ICD-10-CM | POA: Diagnosis not present

## 2019-06-23 DIAGNOSIS — G4733 Obstructive sleep apnea (adult) (pediatric): Secondary | ICD-10-CM | POA: Diagnosis not present

## 2019-06-29 ENCOUNTER — Ambulatory Visit (INDEPENDENT_AMBULATORY_CARE_PROVIDER_SITE_OTHER): Payer: PPO | Admitting: Gastroenterology

## 2019-06-29 ENCOUNTER — Encounter: Payer: Self-pay | Admitting: Gastroenterology

## 2019-06-29 ENCOUNTER — Other Ambulatory Visit: Payer: Self-pay

## 2019-06-29 VITALS — BP 122/61 | HR 67 | Temp 98.1°F | Ht 69.0 in | Wt 179.6 lb

## 2019-06-29 DIAGNOSIS — K219 Gastro-esophageal reflux disease without esophagitis: Secondary | ICD-10-CM

## 2019-06-29 DIAGNOSIS — R112 Nausea with vomiting, unspecified: Secondary | ICD-10-CM | POA: Diagnosis not present

## 2019-06-29 NOTE — H&P (View-Only) (Signed)
Gastroenterology Consultation  Referring Provider:     Paulene Floor Primary Care Physician:  Paulene Floor Primary Gastroenterologist:  Dr. Allen Norris     Reason for Consultation:     Dark stools and anemia        HPI:   Clifford Aguilar is a 78 y.o. y/o male referred for consultation & management of dark stools and anemia by Dr. Terrilee Croak, Wendee Beavers, PA-C.  This patient comes in today with a history of having a colonoscopy by me in March 2017.  The patient had a colonoscopy due to a history of colon polyps.  The polyps at this colonoscopy were not adenomatous.  The patient had recently seen his primary care provider and was reported to have dark stools.  The patient had his blood count sent off and his hemoglobin was low at 12.0.  The patient then had his blood sent off for iron studies and ferritin which were all normal.  The patient's B12 was also normal.  His MCV was normal.  The patient does have a history of GERD.  When the patient was seen by his primary care provider on June 12 he was reporting some abdominal pain. The patient reports that his symptoms started when he was started on amlodipine.  He reports that at that time he was having severe abdominal pain nausea with vomiting.  The patient states that he stopped the amlodipine and his pressures have been better.  He also reports that his vomiting has stopped and his abdominal pain is much improved but he continues to have nausea that is usually after eating and usually after dinner.  He denies any black stools or bloody stools except when he takes Pepto-Bismol.  He states he takes Pepto-Bismol when he has the abdominal pain.  Past Medical History:  Diagnosis Date   Cancer ([place])    basel cell carcinoma left ear   Carotid artery occlusion    s/p right carotid endarterectomy   Chronic kidney disease    renal artery stenosis   Coronary artery disease    s/p coronary bypass graft surgery in 1992, stable   GERD  (gastroesophageal reflux disease)    Hyperlipidemia    Hypertension    Myocardial infarction ([place])    PVD (peripheral vascular disease) ([place])    s/p multiple revascularization procedures.   Shortness of breath     Past Surgical History:  Procedure Laterality Date   abdomial angiogram  03-16-2014   ABDOMINAL ANGIOGRAM N/A 03/16/2014   Procedure: ABDOMINAL ANGIOGRAM;  Surgeon: Rosetta Posner, MD;  Location: [facility] CATH LAB;  Service: Cardiovascular;  Laterality: N/A;   ABDOMINAL AORTAGRAM N/A 12/03/2011   Procedure: ABDOMINAL Maxcine Ham;  Surgeon: Serafina Mitchell, MD;  Location: [facility] CATH LAB;  Service: Cardiovascular;  Laterality: N/A;   CAROTID ENDARTERECTOMY     Right. With 46% stenosis in the right by recent Doppler   COLONOSCOPY WITH PROPOFOL N/A 03/12/2016   Procedure: COLONOSCOPY WITH PROPOFOL;  Surgeon: [name], MD;  Location: [facility] ENDOSCOPY;  Service: Endoscopy;  Laterality: N/A;   CORONARY ARTERY BYPASS GRAFT  1992   left common iliac  12/03/2011   LEFT HEART CATH AND CORS/GRAFTS ANGIOGRAPHY Left 12/08/2018   Procedure: LEFT HEART CATH AND CORS/GRAFTS ANGIOGRAPHY;  Surgeon: Nelva Bush, MD;  Location: [place] CV LAB;  Service: Cardiovascular;  Laterality: Left;   Peripheral vascular revascularization      RENAL ANGIOGRAM  03-16-2014   RENAL ANGIOGRAM Left 12/03/2011  Procedure: RENAL ANGIOGRAM;  Surgeon: Serafina Mitchell, MD;  [facility] C[facility] LAB;  Service: Cardiovascular;  Laterality: Left;  lt renal artery stent   RENAL ANGIOGRAM N/A 03/16/2014   Procedure: RENAL ANGIOGRAM;  Surgeon: Rosetta Posner, MD;  Location: [facility] CATH LAB;  Service: Cardiovascular;  Laterality: N/A;   renal artery stenosis  12/03/2011   SHOULDER ARTHROSCOPY     Right Amnioplasty; Acromioclavicular joint resection and arthroscopic debridement   Surgery for Sleep Apnea      Prior to Admission medications   Medication Sig Start Date End Date Taking? Authorizing Provider    amLODipine (NORVASC) 10 MG tablet Take 1 tablet (10 mg total) by mouth daily. Patient not taking: Reported on 06/11/2019 12/21/18   Minna Merritts, MD  atorvastatin (LIPITOR) 40 MG tablet Take 1 tablet (40 mg total) by mouth daily at 6 PM. 06/04/19   Gollan, Kathlene November, MD  cholecalciferol (VITAMIN D) 1000 units tablet Take 1,000 Units by mouth daily.    [provider]  clopidogrel (PLAVIX) 75 MG tablet TAKE 1 TABLET DAILY 05/20/19   Waynetta Sandy, MD  Coenzyme Q10 (CO Q-10) 100 MG CAPS Take 100 mg by mouth daily.    [provider]  doxazosin (CARDURA) 8 MG tablet Take 1 tablet (8 mg total) by mouth 2 (two) times daily. 04/26/19   Minna Merritts, MD  ezetimibe (ZETIA) 10 MG tablet Take 1 tablet (10 mg total) by mouth daily. 03/23/19   Minna Merritts, MD  Garlic 4287 MG CAPS Take 7,000 mg by mouth.     [provider]  hydrochlorothiazide (HYDRODIURIL) 25 MG tablet Take 1 tablet (25 mg total) by mouth daily. 02/22/19   Minna Merritts, MD  lansoprazole (PREVACID) 15 MG capsule Take 1 capsule (15 mg total) by mouth daily at 12 noon. 01/06/14   Minna Merritts, MD  levothyroxine (SYNTHROID, LEVOTHROID) 25 MCG tablet Take 1 tablet (25 mcg total) by mouth daily before breakfast. 03/31/18   Carmon Ginsberg, PA  Melatonin 3 MG TABS Take 6 mg by mouth at bedtime.    [provider]  metoprolol tartrate (LOPRESSOR) 25 MG tablet TAKE ONE-HALF TABLET BY MOUTH TWICE DAILY 01/28/19   Minna Merritts, MD  nitroGLYCERIN (NITROLINGUAL) 0.4 MG/SPRAY spray Place 1 spray under the tongue every 5 (five) minutes x 3 doses as needed for chest pain. 10/14/17   Minna Merritts, MD  polyethylene glycol (MIRALAX / GLYCOLAX) packet Take 17 g by mouth daily. 03/22/16   Carmon Ginsberg, PA  rivaroxaban (XARELTO) 20 MG TABS tablet Take 1 tablet (20 mg total) by mouth daily with supper. 12/21/18   Minna Merritts, MD  Zn-Pyg Afri-Nettle-Saw Palmet (SAW PALMETTO COMPLEX PO)  Take 1,725 mg by mouth daily.     [provider]    Family History  Problem Relation Age of Onset   Stroke Mother    Heart disease Other    Stroke Sister    Stroke Brother      Social History   Tobacco Use   Smoking status: Former Smoker    Types: Cigarettes    Quit date: 12/30/1990    Years since quitting: 28.5   Smokeless tobacco: Former Systems developer    Quit date: 01/10/1991   Tobacco comment: quit smoking in 1992  Substance Use Topics   Alcohol use: Yes    Alcohol/week: 1.0 - 2.0 standard drinks    Types: 1 - 2 Glasses of wine per week  Comment: 1-2 a week   Drug use: No    Allergies as of 06/29/2019 - Review Complete 06/11/2019  Allergen Reaction Noted   Hydralazine Rash 03/16/2014   Clonidine derivatives Other (See Comments) 11/27/2011   Adhesive [tape]     Latex Other (See Comments) 08/13/2017   Trazodone Other (See Comments) 02/16/2018   Benazepril Other (See Comments) 08/19/2016   Carvedilol Other (See Comments) 01/27/2014   Hydrazine yellow [tartrazine] Itching and Rash 03/03/2014   Isosorbide dinitrate Rash 03/03/2014   Isosorbide mononitrate Rash 11/26/2011   Lisinopril Other (See Comments) 06/29/2012   Losartan Other (See Comments) 03/17/2017    Review of Systems:    All systems reviewed and negative except where noted in HPI.   Physical Exam:  There were no vitals taken for this visit. No LMP for male patient. General:   Alert,  Well-developed, well-nourished, pleasant and cooperative in NAD Head:  Normocephalic and atraumatic. Eyes:  Sclera clear, no icterus.   Conjunctiva pink. Ears:  Normal auditory acuity. Nose:  No deformity, discharge, or lesions. Mouth:  No deformity or lesions,oropharynx pink & moist. Neck:  Supple; no masses or thyromegaly. Lungs:  Respirations even and unlabored.  Clear throughout to auscultation.   No wheezes, crackles, or rhonchi. No acute distress. Heart:  Regular rate and rhythm; no murmurs,  clicks, rubs, or gallops. Abdomen:  Normal bowel sounds.  No bruits.  Soft, non-tender and non-distended without masses, hepatosplenomegaly or hernias noted.  No guarding or rebound tenderness.  Negative Carnett sign.   Rectal:  Deferred.  Msk:  Symmetrical without gross deformities.  Good, equal movement & strength bilaterally. Pulses:  Normal pulses noted. Extremities:  No clubbing or edema.  No cyanosis. Neurologic:  Alert and oriented x3;  grossly normal neurologically. Skin:  Intact without significant lesions or rashes.  No jaundice. Lymph Nodes:  No significant cervical adenopathy. Psych:  Alert and cooperative. Normal mood and affect.  Imaging Studies: No results found.  Assessment and Plan:   Clifford Aguilar is a 78 y.o. y/o male who comes in today with a history of black stools while taking Pepto-Bismol.  The patient reports that since stopping the amlodipine his vomiting has gone away and his abdominal pain is also nonexistent.  He does report that he continues to have some nausea in the evening after eating.  There is no report of any unexplained weight loss fevers chills or vomiting.  The patient will be switched from his Prevacid 15 mg/day to Dexilant 60 mg/day and he will be set up for an EGD.  The patient has been told that his iron studies show his anemia not to be from iron deficiency.  The patient will have the EGD for his nausea and rule out any sort of gastric outlet obstruction which may be causing his postprandial nausea. I have discussed risks & benefits which include, but are not limited to, bleeding, infection, perforation & drug reaction.  The patient agrees with this plan & written consent will be obtained.     [name], MD. [facility]    Note: This dictation was prepared with Dragon dictation along with smaller phrase technology. Any transcriptional errors that result from this process are unintentional.

## 2019-06-29 NOTE — Progress Notes (Signed)
Gastroenterology Consultation  Referring Provider:     Paulene Floor Primary Care Physician:  Paulene Floor Primary Gastroenterologist:  Dr. Allen Norris     Reason for Consultation:     Dark stools and anemia        HPI:   Clifford Aguilar is a 78 y.o. y/o male referred for consultation & management of dark stools and anemia by Dr. Terrilee Croak, Wendee Beavers, PA-C.  This patient comes in today with a history of having a colonoscopy by me in March 2017.  The patient had a colonoscopy due to a history of colon polyps.  The polyps at this colonoscopy were not adenomatous.  The patient had recently seen his primary care provider and was reported to have dark stools.  The patient had his blood count sent off and his hemoglobin was low at 12.0.  The patient then had his blood sent off for iron studies and ferritin which were all normal.  The patient's B12 was also normal.  His MCV was normal.  The patient does have a history of GERD.  When the patient was seen by his primary care provider on June 12 he was reporting some abdominal pain. The patient reports that his symptoms started when he was started on amlodipine.  He reports that at that time he was having severe abdominal pain nausea with vomiting.  The patient states that he stopped the amlodipine and his pressures have been better.  He also reports that his vomiting has stopped and his abdominal pain is much improved but he continues to have nausea that is usually after eating and usually after dinner.  He denies any black stools or bloody stools except when he takes Pepto-Bismol.  He states he takes Pepto-Bismol when he has the abdominal pain.  Past Medical History:  Diagnosis Date   Cancer ([place])    basel cell carcinoma left ear   Carotid artery occlusion    s/p right carotid endarterectomy   Chronic kidney disease    renal artery stenosis   Coronary artery disease    s/p coronary bypass graft surgery in 1992, stable   GERD  (gastroesophageal reflux disease)    Hyperlipidemia    Hypertension    Myocardial infarction ([place])    PVD (peripheral vascular disease) ([place])    s/p multiple revascularization procedures.   Shortness of breath     Past Surgical History:  Procedure Laterality Date   abdomial angiogram  03-16-2014   ABDOMINAL ANGIOGRAM N/A 03/16/2014   Procedure: ABDOMINAL ANGIOGRAM;  Surgeon: Rosetta Posner, MD;  Location: [facility] CATH LAB;  Service: Cardiovascular;  Laterality: N/A;   ABDOMINAL AORTAGRAM N/A 12/03/2011   Procedure: ABDOMINAL Maxcine Ham;  Surgeon: Serafina Mitchell, MD;  Location: [facility] CATH LAB;  Service: Cardiovascular;  Laterality: N/A;   CAROTID ENDARTERECTOMY     Right. With 46% stenosis in the right by recent Doppler   COLONOSCOPY WITH PROPOFOL N/A 03/12/2016   Procedure: COLONOSCOPY WITH PROPOFOL;  Surgeon: [name], MD;  Location: [facility] ENDOSCOPY;  Service: Endoscopy;  Laterality: N/A;   CORONARY ARTERY BYPASS GRAFT  1992   left common iliac  12/03/2011   LEFT HEART CATH AND CORS/GRAFTS ANGIOGRAPHY Left 12/08/2018   Procedure: LEFT HEART CATH AND CORS/GRAFTS ANGIOGRAPHY;  Surgeon: Nelva Bush, MD;  Location: [facility] CV LAB;  Service: Cardiovascular;  Laterality: Left;   Peripheral vascular revascularization      RENAL ANGIOGRAM  03-16-2014   RENAL ANGIOGRAM Left 12/03/2011  Procedure: RENAL ANGIOGRAM;  Surgeon: Serafina Mitchell, MD;  Location: [facility] CATH LAB;  Service: Cardiovascular;  Laterality: Left;  lt renal artery stent   RENAL ANGIOGRAM N/A 03/16/2014   Procedure: RENAL ANGIOGRAM;  Surgeon: Rosetta Posner, MD;  Location: [facility] CATH LAB;  Service: Cardiovascular;  Laterality: N/A;   renal artery stenosis  12/03/2011   SHOULDER ARTHROSCOPY     Right Amnioplasty; Acromioclavicular joint resection and arthroscopic debridement   Surgery for Sleep Apnea      Prior to Admission medications   Medication Sig Start Date End Date Taking? Authorizing Provider    amLODipine (NORVASC) 10 MG tablet Take 1 tablet (10 mg total) by mouth daily. Patient not taking: Reported on 06/11/2019 12/21/18   Minna Merritts, MD  atorvastatin (LIPITOR) 40 MG tablet Take 1 tablet (40 mg total) by mouth daily at 6 PM. 06/04/19   Gollan, Kathlene November, MD  cholecalciferol (VITAMIN D) 1000 units tablet Take 1,000 Units by mouth daily.    [provider]  clopidogrel (PLAVIX) 75 MG tablet TAKE 1 TABLET DAILY 05/20/19   Waynetta Sandy, MD  Coenzyme Q10 (CO Q-10) 100 MG CAPS Take 100 mg by mouth daily.    [provider]  doxazosin (CARDURA) 8 MG tablet Take 1 tablet (8 mg total) by mouth 2 (two) times daily. 04/26/19   Minna Merritts, MD  ezetimibe (ZETIA) 10 MG tablet Take 1 tablet (10 mg total) by mouth daily. 03/23/19   Minna Merritts, MD  Garlic 6160 MG CAPS Take 7,000 mg by mouth.     [provider]  hydrochlorothiazide (HYDRODIURIL) 25 MG tablet Take 1 tablet (25 mg total) by mouth daily. 02/22/19   Minna Merritts, MD  lansoprazole (PREVACID) 15 MG capsule Take 1 capsule (15 mg total) by mouth daily at 12 noon. 01/06/14   Minna Merritts, MD  levothyroxine (SYNTHROID, LEVOTHROID) 25 MCG tablet Take 1 tablet (25 mcg total) by mouth daily before breakfast. 03/31/18   Carmon Ginsberg, PA  Melatonin 3 MG TABS Take 6 mg by mouth at bedtime.    [provider]  metoprolol tartrate (LOPRESSOR) 25 MG tablet TAKE ONE-HALF TABLET BY MOUTH TWICE DAILY 01/28/19   Minna Merritts, MD  nitroGLYCERIN (NITROLINGUAL) 0.4 MG/SPRAY spray Place 1 spray under the tongue every 5 (five) minutes x 3 doses as needed for chest pain. 10/14/17   Minna Merritts, MD  polyethylene glycol (MIRALAX / GLYCOLAX) packet Take 17 g by mouth daily. 03/22/16   Carmon Ginsberg, PA  rivaroxaban (XARELTO) 20 MG TABS tablet Take 1 tablet (20 mg total) by mouth daily with supper. 12/21/18   Minna Merritts, MD  Zn-Pyg Afri-Nettle-Saw Palmet (SAW PALMETTO COMPLEX PO)  Take 1,725 mg by mouth daily.     [provider]    Family History  Problem Relation Age of Onset   Stroke Mother    Heart disease Other    Stroke Sister    Stroke Brother      Social History   Tobacco Use   Smoking status: Former Smoker    Types: Cigarettes    Quit date: 12/30/1990    Years since quitting: 28.5   Smokeless tobacco: Former Systems developer    Quit date: 01/10/1991   Tobacco comment: quit smoking in 1992  Substance Use Topics   Alcohol use: Yes    Alcohol/week: 1.0 - 2.0 standard drinks    Types: 1 - 2 Glasses of wine per week  Comment: 1-2 a week   Drug use: No    Allergies as of 06/29/2019 - Review Complete 06/11/2019  Allergen Reaction Noted   Hydralazine Rash 03/16/2014   Clonidine derivatives Other (See Comments) 11/27/2011   Adhesive [tape]     Latex Other (See Comments) 08/13/2017   Trazodone Other (See Comments) 02/16/2018   Benazepril Other (See Comments) 08/19/2016   Carvedilol Other (See Comments) 01/27/2014   Hydrazine yellow [tartrazine] Itching and Rash 03/03/2014   Isosorbide dinitrate Rash 03/03/2014   Isosorbide mononitrate Rash 11/26/2011   Lisinopril Other (See Comments) 06/29/2012   Losartan Other (See Comments) 03/17/2017    Review of Systems:    All systems reviewed and negative except where noted in HPI.   Physical Exam:  There were no vitals taken for this visit. No LMP for male patient. General:   Alert,  Well-developed, well-nourished, pleasant and cooperative in NAD Head:  Normocephalic and atraumatic. Eyes:  Sclera clear, no icterus.   Conjunctiva pink. Ears:  Normal auditory acuity. Nose:  No deformity, discharge, or lesions. Mouth:  No deformity or lesions,oropharynx pink & moist. Neck:  Supple; no masses or thyromegaly. Lungs:  Respirations even and unlabored.  Clear throughout to auscultation.   No wheezes, crackles, or rhonchi. No acute distress. Heart:  Regular rate and rhythm; no murmurs,  clicks, rubs, or gallops. Abdomen:  Normal bowel sounds.  No bruits.  Soft, non-tender and non-distended without masses, hepatosplenomegaly or hernias noted.  No guarding or rebound tenderness.  Negative Carnett sign.   Rectal:  Deferred.  Msk:  Symmetrical without gross deformities.  Good, equal movement & strength bilaterally. Pulses:  Normal pulses noted. Extremities:  No clubbing or edema.  No cyanosis. Neurologic:  Alert and oriented x3;  grossly normal neurologically. Skin:  Intact without significant lesions or rashes.  No jaundice. Lymph Nodes:  No significant cervical adenopathy. Psych:  Alert and cooperative. Normal mood and affect.  Imaging Studies: No results found.  Assessment and Plan:   Clifford Aguilar is a 78 y.o. y/o male who comes in today with a history of black stools while taking Pepto-Bismol.  The patient reports that since stopping the amlodipine his vomiting has gone away and his abdominal pain is also nonexistent.  He does report that he continues to have some nausea in the evening after eating.  There is no report of any unexplained weight loss fevers chills or vomiting.  The patient will be switched from his Prevacid 15 mg/day to Dexilant 60 mg/day and he will be set up for an EGD.  The patient has been told that his iron studies show his anemia not to be from iron deficiency.  The patient will have the EGD for his nausea and rule out any sort of gastric outlet obstruction which may be causing his postprandial nausea. I have discussed risks & benefits which include, but are not limited to, bleeding, infection, perforation & drug reaction.  The patient agrees with this plan & written consent will be obtained.     [name], MD. Marval Regal    Note: This dictation was prepared with Dragon dictation along with smaller phrase technology. Any transcriptional errors that result from this process are unintentional.

## 2019-07-06 ENCOUNTER — Other Ambulatory Visit: Payer: Self-pay

## 2019-07-09 ENCOUNTER — Other Ambulatory Visit
Admission: RE | Admit: 2019-07-09 | Discharge: 2019-07-09 | Disposition: A | Discharge status: Home / Self Care | Payer: PPO | Source: Ambulatory Visit | Attending: Gastroenterology | Admitting: Gastroenterology

## 2019-07-09 ENCOUNTER — Other Ambulatory Visit: Payer: Self-pay

## 2019-07-09 DIAGNOSIS — Z1159 Encounter for screening for other viral diseases: Secondary | ICD-10-CM | POA: Diagnosis not present

## 2019-07-09 DIAGNOSIS — Z01812 Encounter for preprocedural laboratory examination: Secondary | ICD-10-CM | POA: Diagnosis not present

## 2019-07-10 LAB — SARS CORONAVIRUS 2 (TAT 6-24 HRS): SARS Coronavirus 2: NEGATIVE

## 2019-07-13 ENCOUNTER — Ambulatory Visit
Admission: RE | Admit: 2019-07-13 | Discharge: 2019-07-13 | Disposition: A | Discharge status: Home / Self Care | Payer: PPO | Attending: Gastroenterology | Admitting: Gastroenterology

## 2019-07-13 ENCOUNTER — Encounter
Admission: RE | Disposition: A | Discharge status: Home / Self Care | Payer: Self-pay | Source: Home / Self Care | Attending: Gastroenterology

## 2019-07-13 ENCOUNTER — Ambulatory Visit: Payer: PPO | Admitting: Registered Nurse

## 2019-07-13 ENCOUNTER — Other Ambulatory Visit: Payer: Self-pay

## 2019-07-13 DIAGNOSIS — R195 Other fecal abnormalities: Secondary | ICD-10-CM | POA: Diagnosis not present

## 2019-07-13 DIAGNOSIS — Z87891 Personal history of nicotine dependence: Secondary | ICD-10-CM | POA: Insufficient documentation

## 2019-07-13 DIAGNOSIS — E785 Hyperlipidemia, unspecified: Secondary | ICD-10-CM | POA: Diagnosis not present

## 2019-07-13 DIAGNOSIS — K219 Gastro-esophageal reflux disease without esophagitis: Secondary | ICD-10-CM | POA: Diagnosis not present

## 2019-07-13 DIAGNOSIS — I10 Essential (primary) hypertension: Secondary | ICD-10-CM | POA: Insufficient documentation

## 2019-07-13 DIAGNOSIS — Z79890 Hormone replacement therapy (postmenopausal): Secondary | ICD-10-CM | POA: Diagnosis not present

## 2019-07-13 DIAGNOSIS — I252 Old myocardial infarction: Secondary | ICD-10-CM | POA: Diagnosis not present

## 2019-07-13 DIAGNOSIS — I739 Peripheral vascular disease, unspecified: Secondary | ICD-10-CM | POA: Insufficient documentation

## 2019-07-13 DIAGNOSIS — Z7902 Long term (current) use of antithrombotics/antiplatelets: Secondary | ICD-10-CM | POA: Insufficient documentation

## 2019-07-13 DIAGNOSIS — I129 Hypertensive chronic kidney disease with stage 1 through stage 4 chronic kidney disease, or unspecified chronic kidney disease: Secondary | ICD-10-CM | POA: Diagnosis not present

## 2019-07-13 DIAGNOSIS — Z7901 Long term (current) use of anticoagulants: Secondary | ICD-10-CM | POA: Diagnosis not present

## 2019-07-13 DIAGNOSIS — E039 Hypothyroidism, unspecified: Secondary | ICD-10-CM | POA: Diagnosis not present

## 2019-07-13 DIAGNOSIS — R12 Heartburn: Secondary | ICD-10-CM | POA: Diagnosis not present

## 2019-07-13 DIAGNOSIS — I2510 Atherosclerotic heart disease of native coronary artery without angina pectoris: Secondary | ICD-10-CM | POA: Insufficient documentation

## 2019-07-13 DIAGNOSIS — Z8719 Personal history of other diseases of the digestive system: Secondary | ICD-10-CM | POA: Insufficient documentation

## 2019-07-13 DIAGNOSIS — Z79899 Other long term (current) drug therapy: Secondary | ICD-10-CM | POA: Diagnosis not present

## 2019-07-13 DIAGNOSIS — R112 Nausea with vomiting, unspecified: Secondary | ICD-10-CM | POA: Diagnosis not present

## 2019-07-13 DIAGNOSIS — Z951 Presence of aortocoronary bypass graft: Secondary | ICD-10-CM | POA: Diagnosis not present

## 2019-07-13 DIAGNOSIS — N189 Chronic kidney disease, unspecified: Secondary | ICD-10-CM | POA: Diagnosis not present

## 2019-07-13 DIAGNOSIS — Z888 Allergy status to other drugs, medicaments and biological substances status: Secondary | ICD-10-CM | POA: Diagnosis not present

## 2019-07-13 HISTORY — DX: Sleep apnea, unspecified: G47.30

## 2019-07-13 HISTORY — PX: ESOPHAGOGASTRODUODENOSCOPY (EGD) WITH PROPOFOL: SHX5813

## 2019-07-13 SURGERY — ESOPHAGOGASTRODUODENOSCOPY (EGD) WITH PROPOFOL
Anesthesia: General

## 2019-07-13 MED ORDER — GLYCOPYRROLATE 0.2 MG/ML IJ SOLN
INTRAMUSCULAR | Status: DC | PRN
  Administered 2019-07-13: 0.2 mg via INTRAVENOUS

## 2019-07-13 MED ORDER — FENTANYL CITRATE (PF) 100 MCG/2ML IJ SOLN
INTRAMUSCULAR | Status: AC
  Filled 2019-07-13: qty 2

## 2019-07-13 MED ORDER — PROPOFOL 500 MG/50ML IV EMUL
INTRAVENOUS | Status: AC
  Filled 2019-07-13: qty 50

## 2019-07-13 MED ORDER — PROPOFOL 500 MG/50ML IV EMUL
INTRAVENOUS | Status: AC
  Filled 2019-07-13: qty 100

## 2019-07-13 MED ORDER — DEXILANT 60 MG PO CPDR: 60.0000 mg | DELAYED_RELEASE_CAPSULE | Freq: Every day | ORAL | 11 refills | Status: DC

## 2019-07-13 MED ORDER — PROPOFOL 10 MG/ML IV BOLUS
INTRAVENOUS | Status: DC | PRN
  Administered 2019-07-13: 60 mg via INTRAVENOUS

## 2019-07-13 MED ORDER — LIDOCAINE HCL (CARDIAC) PF 100 MG/5ML IV SOSY
PREFILLED_SYRINGE | INTRAVENOUS | Status: DC | PRN
  Administered 2019-07-13: 60 mg via INTRAVENOUS

## 2019-07-13 MED ORDER — PROPOFOL 500 MG/50ML IV EMUL
INTRAVENOUS | Status: DC | PRN
  Administered 2019-07-13: 140 ug/kg/min via INTRAVENOUS

## 2019-07-13 MED ORDER — SODIUM CHLORIDE 0.9 % IV SOLN
INTRAVENOUS | Status: DC
  Administered 2019-07-13: 09:00:00 via INTRAVENOUS

## 2019-07-13 MED ORDER — FENTANYL CITRATE (PF) 250 MCG/5ML IJ SOLN
INTRAMUSCULAR | Status: DC | PRN
  Administered 2019-07-13: 25 ug via INTRAVENOUS

## 2019-07-13 NOTE — Anesthesia Post-op Follow-up Note (Signed)
Anesthesia QCDR form completed.        

## 2019-07-13 NOTE — Anesthesia Preprocedure Evaluation (Signed)
Anesthesia Evaluation  Patient identified by MRN, date of birth, ID band Patient awake    Reviewed: Allergy & Precautions, H&P , NPO status , Patient's Chart, lab work & pertinent test results, reviewed documented beta blocker date and time   Airway Mallampati: III   Neck ROM: full    Dental  (+) Poor Dentition   Pulmonary neg pulmonary ROS, shortness of breath, former smoker,    Pulmonary exam normal        Cardiovascular hypertension, + CAD, + Past MI and + Peripheral Vascular Disease  negative cardio ROS Normal cardiovascular exam     Neuro/Psych PSYCHIATRIC DISORDERS negative neurological ROS  negative psych ROS   GI/Hepatic negative GI ROS, Neg liver ROS, GERD  ,  Endo/Other  negative endocrine ROSHypothyroidism   Renal/GU negative Renal ROS  negative genitourinary   Musculoskeletal   Abdominal   Peds  Hematology negative hematology ROS (+)   Anesthesia Other Findings Past Medical History:   PVD (peripheral vascular disease) ([place])                        Comment:s/p multiple revascularization procedures.   Carotid artery occlusion                                       Comment:s/p right carotid endarterectomy   Coronary artery disease                                        Comment:s/p coronary bypass graft surgery in 1992,               stable   Hyperlipidemia                                               Hypertension                                                 Myocardial infarction ([employer])                                  Shortness of breath                                          Chronic kidney disease                                         Comment:renal artery stenosis   Cancer ([facility])                                                   Comment:basel cell carcinoma left ear   GERD (gastroesophageal  reflux disease)                     Past Surgical History:   Surgery for Sleep Apnea                                        CORONARY ARTERY BYPASS GRAFT                     1992         CAROTID ENDARTERECTOMY                                          Comment:Right. With 46% stenosis in the right by recent              Doppler   Peripheral vascular revascularization                         SHOULDER ARTHROSCOPY                                            Comment:Right Amnioplasty; Acromioclavicular joint               resection and arthroscopic debridement   renal artery stenosis                            12/03/2011   left common iliac                                12/03/2011   abdomial angiogram                               03-16-2014   RENAL ANGIOGRAM                                  03-16-2014   ABDOMINAL AORTAGRAM                             N/A 12/03/2011      Comment:Procedure: ABDOMINAL Maxcine Ham;  Surgeon: Serafina Mitchell, MD;  Location: [facility] CATH LAB;                Service: Cardiovascular;  Laterality: N/A;   RENAL ANGIOGRAM                                 Left 12/03/2011      Comment:Procedure: RENAL ANGIOGRAM;  Surgeon: Serafina Mitchell, MD;  Location: [facility] CATH LAB;  Service:               Cardiovascular;  Laterality: Left;  lt renal  artery stent   ABDOMINAL ANGIOGRAM                             N/A 03/16/2014      Comment:Procedure: ABDOMINAL ANGIOGRAM;  Surgeon: Rosetta Posner, MD;  Location: [facility] CATH LAB;  Service:               Cardiovascular;  Laterality: N/A;   RENAL ANGIOGRAM                                 N/A 03/16/2014      Comment:Procedure: RENAL ANGIOGRAM;  Surgeon: Rosetta Posner, MD;  Location[facility] CATH LAB;  Service:               Cardiovascular;  Laterality: N/A;   Reproductive/Obstetrics                             Anesthesia Physical  Anesthesia Plan  ASA: III  Anesthesia Plan: General   Post-op Pain Management:    Induction: Intravenous  PONV Risk Score and Plan:  Propofol infusion and TIVA  Airway Management Planned: Nasal Cannula  Additional Equipment:   Intra-op Plan:   Post-operative Plan:   Informed Consent: I have reviewed the patients History and Physical, chart, labs and discussed the procedure including the risks, benefits and alternatives for the proposed anesthesia with the patient or authorized representative who has indicated his/her understanding and acceptance.     Dental Advisory Given  Plan Discussed with: CRNA  Anesthesia Plan Comments:         Anesthesia Quick Evaluation

## 2019-07-13 NOTE — Interval H&P Note (Signed)
History and Physical Interval Note:  07/13/2019 9:29 AM  Clifford Aguilar  has presented today for surgery, with the diagnosis of GERD K21.9 Nausea and vomiting R11.2.  The various methods of treatment have been discussed with the patient and family. After consideration of risks, benefits and other options for treatment, the patient has consented to  Procedure(s): ESOPHAGOGASTRODUODENOSCOPY (EGD) WITH PROPOFOL (N/A) as a surgical intervention.  The patient's history has been reviewed, patient examined, no change in status, stable for surgery.  I have reviewed the patient's chart and labs.  Questions were answered to the patient's satisfaction.     Quindon Denker [employer]

## 2019-07-13 NOTE — Op Note (Signed)
[facility] Gastroenterology Patient Name: Clifford Aguilar Procedure Date: 07/13/2019 9:14 AM MRN: [id number] Account #: [id number] Date of Birth: July 10, 1941 Admit Type: Outpatient Age: 78 Room: [facility] ENDO ROOM 4 Gender: Male Note Status: Finalized Procedure:            Upper GI endoscopy Indications:          Heartburn, Suspected gastro-esophageal reflux disease Providers:            [name] MD, MD Referring MD:         Wendee Beavers. Terrilee Croak (Referring MD) Medicines:            Propofol per Anesthesia Complications:        No immediate complications. Procedure:            Pre-Anesthesia Assessment:                       - Prior to the procedure, a History and Physical was                        performed, and patient medications and allergies were                        reviewed. The patient's tolerance of previous                        anesthesia was also reviewed. The risks and benefits of                        the procedure and the sedation options and risks were                        discussed with the patient. All questions were                        answered, and informed consent was obtained. Prior                        Anticoagulants: The patient has taken no previous                        anticoagulant or antiplatelet agents. ASA Grade                        Assessment: II - A patient with mild systemic disease.                        After reviewing the risks and benefits, the patient was                        deemed in satisfactory condition to undergo the                        procedure.                       After obtaining informed consent, the endoscope was                        passed under direct vision. Throughout the procedure,  the patient's blood pressure, pulse, and oxygen                        saturations were monitored continuously. The Endoscope                        was introduced through the mouth, and  advanced to the                        second part of duodenum. The upper GI endoscopy was                        accomplished without difficulty. The patient tolerated                        the procedure well. Findings:      The examined esophagus was normal.      The entire examined stomach was normal.      The examined duodenum was normal. Impression:           - Normal esophagus.                       - Normal stomach.                       - Normal examined duodenum.                       - No specimens collected. Recommendation:       - Discharge patient to home.                       - Resume previous diet.                       - Continue present medications. Procedure Code(s):    --- Professional ---                       [phone], Esophagogastroduodenoscopy, flexible, transoral;                        diagnostic, including collection of specimen(s) by                        brushing or washing, when performed (separate procedure) Diagnosis Code(s):    --- Professional ---                       R12, Heartburn CPT copyright 2019 American Medical Association. All rights reserved. The codes documented in this report are preliminary and upon coder review may  be revised to meet current compliance requirements. [name] MD, MD 07/13/2019 9:27:31 AM This report has been signed electronically. Number of Addenda: 0 Note Initiated On: 07/13/2019 9:14 AM Estimated Blood Loss: Estimated blood loss: none.      [facility]

## 2019-07-13 NOTE — Transfer of Care (Signed)
Immediate Anesthesia Transfer of Care Note  Patient: Clifford Aguilar  Procedure(s) Performed: ESOPHAGOGASTRODUODENOSCOPY (EGD) WITH PROPOFOL (N/A )  Patient Location: PACU  Anesthesia Type:General  Level of Consciousness: sedated  Airway & Oxygen Therapy: Patient Spontanous Breathing and Patient connected to nasal cannula oxygen  Post-op Assessment: Report given to RN and Post -op Vital signs reviewed and stable  Post vital signs: Reviewed and stable  Last Vitals:  Vitals Value Taken Time  BP 146/42 07/13/19 0930  Temp 36.1 C 07/13/19 0930  Pulse 48 07/13/19 0932  Resp 17 07/13/19 0932  SpO2 99 % 07/13/19 0932  Vitals shown include unvalidated device data.  Last Pain:  Vitals:   07/13/19 0930  TempSrc:   PainSc: Asleep         Complications: No apparent anesthesia complications

## 2019-07-14 ENCOUNTER — Encounter: Payer: Self-pay | Admitting: Gastroenterology

## 2019-07-14 NOTE — Anesthesia Postprocedure Evaluation (Signed)
Anesthesia Post Note  Patient: Clifford Aguilar  Procedure(s) Performed: ESOPHAGOGASTRODUODENOSCOPY (EGD) WITH PROPOFOL (N/A )  Patient location during evaluation: Endoscopy Anesthesia Type: General Level of consciousness: awake and alert and oriented Pain management: pain level controlled Vital Signs Assessment: post-procedure vital signs reviewed and stable Respiratory status: spontaneous breathing Cardiovascular status: blood pressure returned to baseline Anesthetic complications: no     Last Vitals:  Vitals:   07/13/19 0940 07/13/19 0950  BP: (!) 132/53 (!) 138/54  Pulse: (!) 59 (!) 59  Resp: 11 16  Temp:    SpO2: 94% 97%    Last Pain:  Vitals:   07/14/19 0714  TempSrc:   PainSc: 0-No pain                 Arora Coakley

## 2019-07-23 ENCOUNTER — Telehealth: Payer: Self-pay | Admitting: Cardiovascular Disease

## 2019-07-23 DIAGNOSIS — I10 Essential (primary) hypertension: Secondary | ICD-10-CM | POA: Diagnosis not present

## 2019-07-23 DIAGNOSIS — G4733 Obstructive sleep apnea (adult) (pediatric): Secondary | ICD-10-CM | POA: Diagnosis not present

## 2019-07-23 NOTE — Telephone Encounter (Signed)

## 2019-07-25 NOTE — Progress Notes (Signed)
Cardiology Office Note  Date:  07/26/2019   ID:  Clifford Aguilar, DOB [birth year], MRN [id number]  PCP:  Trinna Post, PA-C   Chief Complaint  Patient presents with  . Other    Past due follow up. Patient denies chest pain and SOB. Meds reviewed verbally with patient.     HPI:  Clifford Aguilar is a 78 years old with past medical history of PAF on anticoagulation CAD,  peripheral vascular disease,  bypass surgery in 1992,  carotid endarterectomy on the right with residual 60-70% disease on last ultrasound in 2013 , chronically occluded left carotid artery,  multiple surgical procedures on his lower extremity by Dr. Donnetta Hutching,  left iliac stent and left renal angioplasty with Dr. 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% Atrial fib 11/2018 who presents for routine followup of his CAD, s/p CABG, atrial fib  Had a fall today,  Seems to stumble, "does not pick up his feet" Did not hurt himself  Chronic diarrhea, vomiting Seen by Dr. Allen Norris Had GERD  Reports walks on a regular basis No tach/palpitations, sx concerning of atrial fib  EKG personally reviewed by myself on todays visit Shows normal sinus rhythm rate 61 bpm no significant ST-T wave changes  prior episode of paroxysmal atrial fibrillation Went to dentist, then out to lunch Did not feel well, took NTG Lay down in the bed Woke up with chest discomfort, did not feel well Went to the ER, noted to be in atrial fib with RVR, December 07, 2018  Admitted to the hospital, that afternoon went back into normal sinus rhythm Elevated troponin due to demand ischemia:  Troponin 4.16. Catheterization performed 1. Severe native CAD, including diffusely disease proximal LAD up to 80% and CTO's of ostial LCx and ostial RCA. 2. Patent LIMA->LAD, RIMI->RCA, and SVG->diag. Patient reportedly had CABG x  4; question if jump portion of SVG to an OM is occluded  Aspirin has been discontinued and started on Xarelto for atrial fibrillation.   Since discharge denies any tachycardia or palpitations concerning for recurrent atrial fibrillation  Labs reviewed  total cholesterol 132 LDL 72  EKG personally reviewed by myself on todays visit Shows normal sinus rhythm rate 61 bpm APCs and PVCs, no change from prior visit  Other past medical history reviewed Previously had issues with benazepril, GI problems, diarrhea Symptoms improved by stopping benazepril  Previous carotid ultrasound Carotid 10/16 40 to 59% on the right Followed in [place]  in January 2015, he developed symptoms of "head heaviness ", with discomfort in the back of his neck He took a NTG, and subsequently his BP dropped, he had syncope, family called 911, he was taken by emt to [facility] It was felt his hypotension related to treatment with Cardura in addition to taking sublingual nitroglycerin in the standing position. He ruled out for myocardial infarction. He underwent a nuclear stress test which showed no evidence of ischemia. Echocardiogram was also unremarkable. He was discharged home on hydralazine in addition to his other blood pressure medications. Since then he reports stopping hydralazine secondary to rash.  In fact he has numerous medications that have given him a rash   3/15 he was seen by Dr. Donnetta Hutching Found to have renal artery stenosis that had progressed,  3/18 he had catheterization for renal artery stent placement  Previous history of blurry vision episodes.   He has  not had a cardiac catheterization or stress test in 10 years. He believes he had a catheterization 10 years ago, 3 catheterizations since his bypass surgery 20 years ago.  He quit smoking after his bypass surgery in 1992.  Allergies: imdur/isoorbide dinitrate, lisinsopril, clonidine, hydralazine, coreg,  Benazepril   PMH:   has a past  medical history of Cancer ([place]), Carotid artery occlusion, Chronic kidney disease, Coronary artery disease, GERD (gastroesophageal reflux disease), Hyperlipidemia, Hypertension, Myocardial infarction [facility]), PVD (peripheral vascular disease) ([place]), Shortness of breath, and Sleep apnea.  PSH:    Past Surgical History:  Procedure Laterality Date  . abdomial angiogram  03-16-2014  . ABDOMINAL ANGIOGRAM N/A 03/16/2014   Procedure: ABDOMINAL ANGIOGRAM;  Surgeon: Rosetta Posner, MD;  Location: [facility] CATH LAB;  Service: Cardiovascular;  Laterality: N/A;  . ABDOMINAL AORTAGRAM N/A 12/03/2011   Procedure: ABDOMINAL AORTAGRAM;  Surgeon: Serafina Mitchell, MD;  Location: [facility] CATH LAB;  Service: Cardiovascular;  Laterality: N/A;  . CAROTID ENDARTERECTOMY     Right. With 46% stenosis in the right by recent Doppler  . COLONOSCOPY WITH PROPOFOL N/A 03/12/2016   Procedure: COLONOSCOPY WITH PROPOFOL;  Surgeon: [name], MD;  Location: [facility] ENDOSCOPY;  Service: Endoscopy;  Laterality: N/A;  . CORONARY ARTERY BYPASS GRAFT  1992  . ESOPHAGOGASTRODUODENOSCOPY (EGD) WITH PROPOFOL N/A 07/13/2019   Procedure: ESOPHAGOGASTRODUODENOSCOPY (EGD) WITH PROPOFOL;  Surgeon: [name], MD;  Location: [facility] ENDOSCOPY;  Service: Endoscopy;  Laterality: N/A;  . left common iliac  12/03/2011  . LEFT HEART CATH AND CORS/GRAFTS ANGIOGRAPHY Left 12/08/2018   Procedure: LEFT HEART CATH AND CORS/GRAFTS ANGIOGRAPHY;  Surgeon: Nelva Bush, MD;  Location: [facility] CV LAB;  Service: Cardiovascular;  Laterality: Left;  . Peripheral vascular revascularization     . RENAL ANGIOGRAM  03-16-2014  . RENAL ANGIOGRAM Left 12/03/2011   Procedure: RENAL ANGIOGRAM;  Surgeon: Serafina Mitchell, MD;  Location: [facility] CATH LAB;  Service: Cardiovascular;  Laterality: Left;  lt renal artery stent  . RENAL ANGIOGRAM N/A 03/16/2014   Procedure: RENAL ANGIOGRAM;  Surgeon: Rosetta Posner, MD;  Location: [facility] CATH LAB;  Service: Cardiovascular;  Laterality: N/A;  .  renal artery stenosis  12/03/2011  . SHOULDER ARTHROSCOPY     Right Amnioplasty; Acromioclavicular joint resection and arthroscopic debridement  . Surgery for Sleep Apnea      Current Outpatient Medications  Medication Sig Dispense Refill  . amLODipine (NORVASC) 10 MG tablet Take 1 tablet (10 mg total) by mouth daily. (Patient not taking: Reported on 06/29/2019) 90 tablet 3  . atorvastatin (LIPITOR) 40 MG tablet Take 1 tablet (40 mg total) by mouth daily at 6 PM. 90 tablet 0  . clopidogrel (PLAVIX) 75 MG tablet TAKE 1 TABLET DAILY 90 tablet 3  . Coenzyme Q10 (CO Q-10) 100 MG CAPS Take 100 mg by mouth daily.    Marland Kitchen doxazosin (CARDURA) 8 MG tablet Take 1 tablet (8 mg total) by mouth 2 (two) times daily. 180 tablet 0  . ezetimibe (ZETIA) 10 MG tablet Take 1 tablet (10 mg total) by mouth daily. 90 tablet 0  . Garlic 8341 MG CAPS Take 7,000 mg by mouth.     . hydrochlorothiazide (HYDRODIURIL) 25 MG tablet Take 1 tablet (25 mg total) by mouth daily. 90 tablet 3  . lansoprazole (PREVACID) 15 MG capsule Take 1 capsule (15 mg total) by mouth daily at 12 noon. 30 capsule 6  . levothyroxine (SYNTHROID, LEVOTHROID) 25 MCG tablet Take 1 tablet (25 mcg total) by  mouth daily before breakfast. 90 tablet 3  . losartan (COZAAR) 25 MG tablet Take by mouth. As needed    . Melatonin 3 MG TABS Take 6 mg by mouth at bedtime.    . nitroGLYCERIN (NITROLINGUAL) 0.4 MG/SPRAY spray Place 1 spray under the tongue every 5 (five) minutes x 3 doses as needed for chest pain. 12 g 3  . polyethylene glycol (MIRALAX / GLYCOLAX) packet Take 17 g by mouth daily. 90 packet 3  . rivaroxaban (XARELTO) 20 MG TABS tablet Take 1 tablet (20 mg total) by mouth daily with supper. 90 tablet 3   No current facility-administered medications for this visit.      Allergies:   Hydralazine, Clonidine derivatives, Adhesive [tape], Isosorbide, Latex, Trazodone, Benazepril, Carvedilol, Hydrazine yellow [tartrazine], Isosorbide dinitrate,  Isosorbide mononitrate, Lisinopril, and Losartan   Social History:  The patient  reports that he quit smoking about 28 years ago. His smoking use included cigarettes. He quit smokeless tobacco use about 28 years ago. He reports current alcohol use of about 1.0 - 2.0 standard drinks of alcohol per week. He reports that he does not use drugs.   Family History:   family history includes Heart disease in an other family member; Stroke in his brother, mother, and sister.    Review of Systems: Review of Systems  Constitutional: Negative.   Respiratory: Negative.   Cardiovascular: Negative.   Gastrointestinal: Negative.   Musculoskeletal: Negative.        Gait instability, falls  Neurological: Negative.   Psychiatric/Behavioral: Negative.   All other systems reviewed and are negative.    PHYSICAL EXAM: VS:  BP 106/60 (BP Location: Left Arm, Patient Position: Sitting, Cuff Size: Normal)   Pulse 61   Ht 5\' 9"  (1.753 m)   Wt 179 lb 4 oz (81.3 kg)   BMI 26.47 kg/m  , BMI Body mass index is 26.47 kg/m. Constitutional:  oriented to person, place, and time. No distress.  HENT:  Head: Grossly normal Eyes:  no discharge. No scleral icterus.  Neck: No JVD, no carotid bruits  Cardiovascular: Regular rate and rhythm, no murmurs appreciated Pulmonary/Chest: Clear to auscultation bilaterally, no wheezes or rails Abdominal: Soft.  no distension.  no tenderness.  Musculoskeletal: Normal range of motion Neurological:  normal muscle tone. Coordination normal. No atrophy Skin: Skin warm and dry Psychiatric: normal affect, pleasant   Recent Labs: 05/11/2019: ALT 17; BUN 17; Creatinine, Ser 0.81; Hemoglobin 12.0; Platelets 194; Potassium 5.0; Sodium 140; TSH 3.950    Lipid Panel Lab Results  Component Value Date   CHOL 132 05/11/2019   HDL 37 (L) 05/11/2019   LDLCALC 72 05/11/2019   TRIG 115 05/11/2019      Wt Readings from Last 3 Encounters:  07/26/19 179 lb 4 oz (81.3 kg)  07/13/19  175 lb (79.4 kg)  06/29/19 179 lb 9.6 oz (81.5 kg)      ASSESSMENT AND PLAN:  Atherosclerosis of native coronary artery of native heart without angina pectoris -  Currently with no symptoms of angina. No further workup at this time. Continue current medication regimen.  Bilateral carotid artery stenosis - Plan: EKG 12-Lead stable disease Lipid panel at goal, non-smoker  PAD (peripheral artery disease) (HCC) - Plan: EKG 12-Lead Nonsmoker, lipids stable, no diabetes  Essential hypertension Stay on current meds  Paroxysmal atrial fibrillation On xarelto, metoprolol  Mixed hyperlipidemia continue Lipitor up to 80 mg daily with Zetia  Recommend a low carbohydrate diet   Total encounter time  more than 25 minutes  Greater than 50% was spent in counseling and coordination of care with the patient   Disposition:   F/U  12 months   Orders Placed This Encounter  Procedures  . EKG 12-Lead     Signed, Esmond Plants, M.D., Ph.D. 07/26/2019  [place], [place]

## 2019-07-26 ENCOUNTER — Other Ambulatory Visit: Payer: Self-pay

## 2019-07-26 ENCOUNTER — Ambulatory Visit (INDEPENDENT_AMBULATORY_CARE_PROVIDER_SITE_OTHER): Payer: PPO | Admitting: Cardiovascular Disease

## 2019-07-26 ENCOUNTER — Other Ambulatory Visit: Payer: Self-pay | Admitting: Gastroenterology

## 2019-07-26 VITALS — BP 106/60 | HR 61 | Ht 69.0 in | Wt 179.2 lb

## 2019-07-26 DIAGNOSIS — I701 Atherosclerosis of renal artery: Secondary | ICD-10-CM

## 2019-07-26 DIAGNOSIS — I6523 Occlusion and stenosis of bilateral carotid arteries: Secondary | ICD-10-CM | POA: Diagnosis not present

## 2019-07-26 DIAGNOSIS — I10 Essential (primary) hypertension: Secondary | ICD-10-CM

## 2019-07-26 DIAGNOSIS — E782 Mixed hyperlipidemia: Secondary | ICD-10-CM | POA: Diagnosis not present

## 2019-07-26 DIAGNOSIS — I480 Paroxysmal atrial fibrillation: Secondary | ICD-10-CM

## 2019-07-26 DIAGNOSIS — I739 Peripheral vascular disease, unspecified: Secondary | ICD-10-CM | POA: Diagnosis not present

## 2019-07-26 DIAGNOSIS — I25118 Atherosclerotic heart disease of native coronary artery with other forms of angina pectoris: Secondary | ICD-10-CM

## 2019-07-26 DIAGNOSIS — G4733 Obstructive sleep apnea (adult) (pediatric): Secondary | ICD-10-CM | POA: Diagnosis not present

## 2019-07-26 MED ORDER — METOPROLOL TARTRATE 25 MG PO TABS: 12.5000 mg | ORAL_TABLET | Freq: Two times a day (BID) | ORAL | 3 refills | Status: DC

## 2019-07-26 NOTE — Telephone Encounter (Signed)
*  STAT* If patient is at the pharmacy, call can be transferred to refill team.   1. Which medications need to be refilled? (please list name of each medication and dose if known) Dexilant (haven't heard from insurance company) or call in Acifex generic.  2. Which pharmacy/location (including street and city if local pharmacy) is medication to be sent to? [facility] order  [facility]  3. Do they need a 30 day or 90 day supply? 30 day

## 2019-07-26 NOTE — Patient Instructions (Addendum)
Medication Instructions:  Added back on your list metoprolol tartrate 12.5 mg BID back on list  If you need a refill on your cardiac medications before your next appointment, please call your pharmacy.    Lab work: No new labs needed   If you have labs (blood work) drawn today and your tests are completely normal, you will receive your results only by: Marland Kitchen MyChart Message (if you have MyChart) OR . A paper copy in the mail If you have any lab test that is abnormal or we need to change your treatment, we will call you to review the results.   Testing/Procedures: No new testing needed   Follow-Up: At [facility], you and your health needs are our priority.  As part of our continuing mission to provide you with exceptional heart care, we have created designated Provider Care Teams.  These Care Teams include your primary Cardiologist (physician) and Advanced Practice Providers (APPs -  Physician Assistants and Nurse Practitioners) who all work together to provide you with the care you need, when you need it.  . You will need a follow up appointment in 12 months .   Please call our office 2 months in advance to schedule this appointment.    . Providers on your designated Care Team:   . Murray Hodgkins, NP . Christell Faith, PA-C . Marrianne Mood, PA-C  Any Other Special Instructions Will Be Listed Below (If Applicable).  For educational health videos Log in to : [email or web address] Or : [email or web address], password : [email or web address]

## 2019-07-27 ENCOUNTER — Telehealth: Payer: Self-pay | Admitting: Cardiovascular Disease

## 2019-07-27 NOTE — Telephone Encounter (Signed)
Patient wife called and states patient is no longer taking Amlodipine due to GI issues, and wanted Dr. Rockey Situ to know this.

## 2019-07-27 NOTE — Telephone Encounter (Signed)
Update fwd to Dr.Gollan

## 2019-07-29 NOTE — Telephone Encounter (Signed)
Would monitor BP It was at the low end on recent visit Can we take amlodipine off med list  thx

## 2019-07-30 NOTE — Telephone Encounter (Signed)
Spoke with patients wife per release form and reviewed provider recommendations to monitor blood pressures. Amlodipine removed from patients list and she was agreeable with plan and had no further questions at this time.

## 2019-08-02 ENCOUNTER — Other Ambulatory Visit: Payer: Self-pay | Admitting: Cardiovascular Disease

## 2019-08-02 ENCOUNTER — Other Ambulatory Visit: Payer: Self-pay

## 2019-08-02 MED ORDER — DEXILANT 60 MG PO CPDR: 60.0000 mg | DELAYED_RELEASE_CAPSULE | Freq: Every day | ORAL | 3 refills | Status: DC

## 2019-08-03 NOTE — Telephone Encounter (Signed)
Rx for Dexilant has been sent to pharmacy.

## 2019-08-09 ENCOUNTER — Other Ambulatory Visit: Payer: Self-pay | Admitting: Gastroenterology

## 2019-08-09 NOTE — Telephone Encounter (Signed)
Patients wife called and insurance will not pay for [place].Needs the generic Acifex called into [facility] for only 30 days. Nyeem wants to see how it works for 30 days.

## 2019-08-11 ENCOUNTER — Other Ambulatory Visit: Payer: Self-pay

## 2019-08-11 MED ORDER — RABEPRAZOLE SODIUM 20 MG PO TBEC: 20.0000 mg | DELAYED_RELEASE_TABLET | Freq: Every day | ORAL | 0 refills | Status: DC

## 2019-08-11 NOTE — Telephone Encounter (Signed)
Rx for Aciphex 20mg  sent to pt's pharmacy.

## 2019-08-12 ENCOUNTER — Other Ambulatory Visit: Payer: Self-pay

## 2019-08-12 MED ORDER — RABEPRAZOLE SODIUM 20 MG PO TBEC: 20.0000 mg | DELAYED_RELEASE_TABLET | Freq: Every day | ORAL | 0 refills | Status: DC

## 2019-08-23 ENCOUNTER — Telehealth: Payer: Self-pay | Admitting: Gastroenterology

## 2019-08-23 NOTE — Telephone Encounter (Signed)
Patients wife LVM stating Clifford Aguilar is not feeling better with reflux. What else can he do as the Acifex is not working?

## 2019-08-23 NOTE — Telephone Encounter (Signed)
Pt has been scheduled for a follow up appt due to increased discomfort.

## 2019-08-25 ENCOUNTER — Ambulatory Visit (INDEPENDENT_AMBULATORY_CARE_PROVIDER_SITE_OTHER): Payer: PPO | Admitting: Gastroenterology

## 2019-08-25 ENCOUNTER — Encounter: Payer: Self-pay | Admitting: Gastroenterology

## 2019-08-25 ENCOUNTER — Other Ambulatory Visit: Payer: Self-pay

## 2019-08-25 VITALS — BP 96/49 | HR 58 | Temp 97.6°F | Ht 69.0 in | Wt 180.4 lb

## 2019-08-25 DIAGNOSIS — R1033 Periumbilical pain: Secondary | ICD-10-CM | POA: Diagnosis not present

## 2019-08-25 MED ORDER — HYOSCYAMINE SULFATE 0.125 MG SL SUBL: 0.1250 mg | SUBLINGUAL_TABLET | SUBLINGUAL | 0 refills | Status: DC | PRN

## 2019-08-25 NOTE — Progress Notes (Signed)
Primary Care Physician: Paulene Floor  Primary Gastroenterologist:  Dr. [name]  Chief Complaint  Patient presents with  . Follow up GERD    HPI: Clifford Aguilar is a 78 y.o. male here for follow-up.  The patient had seen me for anemia that did not appear to be iron deficiency and a history of abdominal pain with nausea vomiting and reflux.  The patient stopped his amlodipine and some of his symptoms went away but the patient continued to have reflux symptoms.  The patient was tried on [employer] and most recently AcipHex.  The patient had called into the office saying that his symptoms had not gotten better.  The patient EGD was completely normal without any signs of reflux inflammation or ulcerations.  The patient is followed by cardiology for atherosclerotic coronary disease. The patient reports that his abdominal discomfort is usually only after dinner.  He denies at the dinner meal is any bigger than any other meal and in fact may be a small quantity.  He also reports that he does a lot of work in the yard all day long.  The pain has not been present for the last 5 days and he is not sure why it has gone away.  He continues to deny any change in bowel habits such as diarrhea constipation black stools or bloody stools.  There is also no unexplained weight loss.  Current Outpatient Medications  Medication Sig Dispense Refill  . atorvastatin (LIPITOR) 40 MG tablet Take 1 tablet (40 mg total) by mouth daily at 6 PM. 90 tablet 0  . clopidogrel (PLAVIX) 75 MG tablet TAKE 1 TABLET DAILY 90 tablet 3  . Coenzyme Q10 (CO Q-10) 100 MG CAPS Take 100 mg by mouth daily.    Marland Kitchen dexlansoprazole (DEXILANT) 60 MG capsule Take 1 capsule (60 mg total) by mouth daily. 90 capsule 3  . doxazosin (CARDURA) 8 MG tablet TAKE ONE TABLET BY MOUTH TWICE DAILY 180 tablet 3  . ezetimibe (ZETIA) 10 MG tablet Take 1 tablet (10 mg total) by mouth daily. 90 tablet 3  . Garlic 123XX123 MG CAPS Take 7,000 mg  by mouth.     . hydrochlorothiazide (HYDRODIURIL) 25 MG tablet Take 1 tablet (25 mg total) by mouth daily. 90 tablet 3  . lansoprazole (PREVACID) 15 MG capsule Take 1 capsule (15 mg total) by mouth daily at 12 noon. 30 capsule 6  . levothyroxine (SYNTHROID, LEVOTHROID) 25 MCG tablet Take 1 tablet (25 mcg total) by mouth daily before breakfast. 90 tablet 3  . losartan (COZAAR) 25 MG tablet Take by mouth. As needed    . Melatonin 3 MG TABS Take 6 mg by mouth at bedtime.    . metoprolol tartrate (LOPRESSOR) 25 MG tablet Take 0.5 tablets (12.5 mg total) by mouth 2 (two) times daily. 90 tablet 3  . nitroGLYCERIN (NITROLINGUAL) 0.4 MG/SPRAY spray Place 1 spray under the tongue every 5 (five) minutes x 3 doses as needed for chest pain. 12 g 3  . polyethylene glycol (MIRALAX / GLYCOLAX) packet Take 17 g by mouth daily. 90 packet 3  . RABEprazole (ACIPHEX) 20 MG tablet Take 1 tablet (20 mg total) by mouth daily. 30 tablet 0  . rivaroxaban (XARELTO) 20 MG TABS tablet Take 1 tablet (20 mg total) by mouth daily with supper. 90 tablet 3   No current facility-administered medications for this visit.     Allergies as of 08/25/2019 - Review Complete 08/25/2019  Allergen Reaction Noted  . Hydralazine Rash 03/16/2014  . Clonidine derivatives Other (See Comments) 11/27/2011  . Adhesive [tape]    . Isosorbide  08/13/2017  . Latex Other (See Comments) 08/13/2017  . Trazodone Other (See Comments) 02/16/2018  . Benazepril Other (See Comments) 08/19/2016  . Carvedilol Other (See Comments) 01/27/2014  . Hydrazine yellow [tartrazine] Itching and Rash 03/03/2014  . Isosorbide dinitrate Rash 03/03/2014  . Isosorbide mononitrate Rash 11/26/2011  . Lisinopril Other (See Comments) 06/29/2012  . Losartan Other (See Comments) 03/17/2017    ROS:  General: Negative for anorexia, weight loss, fever, chills, fatigue, weakness. ENT: Negative for hoarseness, difficulty swallowing , nasal congestion. CV: Negative for  chest pain, angina, palpitations, dyspnea on exertion, peripheral edema.  Respiratory: Negative for dyspnea at rest, dyspnea on exertion, cough, sputum, wheezing.  GI: See history of present illness. GU:  Negative for dysuria, hematuria, urinary incontinence, urinary frequency, nocturnal urination.  Endo: Negative for unusual weight change.    Physical Examination:   There were no vitals taken for this visit.  General: Well-nourished, well-developed in no acute distress.  Eyes: No icterus. Conjunctivae pink. Mouth: Oropharyngeal mucosa moist and pink , no lesions erythema or exudate. Lungs: Clear to auscultation bilaterally. Non-labored. Heart: Regular rate and rhythm, no murmurs rubs or gallops.  Abdomen: Bowel sounds are normal, nontender, nondistended, no hepatosplenomegaly or masses, no abdominal bruits or hernia , no rebound or guarding.   Extremities: No lower extremity edema. No clubbing or deformities. Neuro: Alert and oriented x 3.  Grossly intact. Skin: Warm and dry, no jaundice.   Psych: Alert and cooperative, normal mood and affect.  Labs:    Imaging Studies: No results found.  Assessment and Plan:   Clifford Aguilar is a 78 y.o. y/o male today with lower abdominal pain that has nothing to do with his reflux symptoms.  The patient states he has not had reflux in many years on his PPI.  The patient's EGD was normal.  The patient has been told to try and stop his PPI to see if his symptoms get worse.  The patient's lower abdominal pain sounds like musculoskeletal versus spasmodic pains.  The patient will be started on Levsin 0.125 mg to be taken sublingually when he has the discomfort to see if this abates the pain and thereby confirming that this is intestinal spasms.  If it does not think that the patient may need a CT scan and possibly a repeat colonoscopy since his last colonoscopy.  The patient and his wife have been explained the plan and agree with it.    [name],  MD. Marval Regal   Note: This dictation was prepared with Dragon dictation along with smaller phrase technology. Any transcriptional errors that result from this process are unintentional.

## 2019-08-26 DIAGNOSIS — I10 Essential (primary) hypertension: Secondary | ICD-10-CM | POA: Diagnosis not present

## 2019-08-26 DIAGNOSIS — G4733 Obstructive sleep apnea (adult) (pediatric): Secondary | ICD-10-CM | POA: Diagnosis not present

## 2019-09-13 ENCOUNTER — Other Ambulatory Visit: Payer: Self-pay | Admitting: Cardiovascular Disease

## 2019-10-05 ENCOUNTER — Ambulatory Visit (INDEPENDENT_AMBULATORY_CARE_PROVIDER_SITE_OTHER): Payer: PPO | Admitting: Physician Assistant

## 2019-10-05 ENCOUNTER — Other Ambulatory Visit: Payer: Self-pay

## 2019-10-05 ENCOUNTER — Ambulatory Visit
Admission: RE | Admit: 2019-10-05 | Discharge: 2019-10-05 | Disposition: A | Discharge status: Home / Self Care | Payer: PPO | Source: Ambulatory Visit | Attending: Physician Assistant | Admitting: Physician Assistant

## 2019-10-05 ENCOUNTER — Encounter: Payer: Self-pay | Admitting: Physician Assistant

## 2019-10-05 VITALS — BP 115/53 | HR 56 | Temp 96.8°F | Wt 181.0 lb

## 2019-10-05 DIAGNOSIS — M67449 Ganglion, unspecified hand: Secondary | ICD-10-CM | POA: Diagnosis not present

## 2019-10-05 DIAGNOSIS — M79671 Pain in right foot: Secondary | ICD-10-CM | POA: Insufficient documentation

## 2019-10-05 DIAGNOSIS — S99921A Unspecified injury of right foot, initial encounter: Secondary | ICD-10-CM | POA: Diagnosis not present

## 2019-10-05 DIAGNOSIS — M79674 Pain in right toe(s): Secondary | ICD-10-CM | POA: Diagnosis not present

## 2019-10-05 NOTE — Patient Instructions (Signed)
Dr. Peggye Ley at [facility] for hand surgery.    Toe Fracture A toe fracture is a break in one of the toe bones (phalanges). This may happen if you:  Drop a heavy object on your toe.  Stub your toe.  Twist your toe.  Exercise the same way too much. What are the signs or symptoms? The main symptoms are swelling and pain in the toe. You may also have:  Bruising.  Stiffness.  Numbness.  A change in the way the toe looks.  Broken bones that poke through the skin.  Blood under the toenail. How is this treated? Treatments may include:  Taping the broken toe to a toe that is next to it (buddy taping).  Wearing a shoe that has a wide, rigid sole to protect the toe and to limit its movement.  Wearing a cast.  Surgery. This may be needed if the: ? Pieces of broken bone are out of place. ? Bone pokes through the skin.  Physical therapy. Follow these instructions at home: If you have a shoe:  Wear the shoe as told by your doctor. Remove it only as told by your doctor.  Loosen the shoe if your toes tingle, become numb, or turn cold and blue.  Keep the shoe clean and dry. If you have a cast:  Do not put pressure on any part of the cast until it is fully hardened. This may take a few hours.  Do not stick anything inside the cast to scratch your skin.  Check the skin around the cast every day. Tell your doctor about any concerns.  You may put lotion on dry skin around the edges of the cast.  Do not put lotion on the skin under the cast.  Keep the cast clean and dry. Bathing  Do not take baths, swim, or use a hot tub until your doctor says it is okay. Ask your doctor if you can take showers.  If the shoe or cast is not waterproof: ? Do not let it get wet. ? Cover it with a watertight covering when you take a bath or a shower. Activity  Do not use your foot to support your body weight until your doctor says it is okay.  Use crutches as told by your doctor.  Ask  your doctor what activities are safe for you during recovery.  Avoid activities as told by your doctor.  Do exercises as told by your doctor or therapist. Driving  Do not drive or use heavy machinery while taking pain medicine.  Do not drive while wearing a cast on a foot that you use for driving. Managing pain, stiffness, and swelling   Put ice on the injured area if told by your doctor: ? Put ice in a plastic bag. ? Place a towel between your skin and the bag.  If you have a shoe, remove it as told by your doctor.  If you have a cast, place a towel between your cast and the bag. ? Leave the ice on for 20 minutes, 2-3 times per day.  Raise (elevate) the injured area above the level of your heart while you are sitting or lying down. General instructions  If your toe was taped to a toe that is next to it, follow your doctor's instructions for changing the gauze and tape. Change it more often: ? If the gauze and tape get wet. If this happens, dry the space between the toes. ? If the gauze and tape  are too tight and they cause your toe to become pale or to lose feeling (go numb).  If your doctor did not give you a protective shoe, wear sturdy shoes that support your foot. Your shoes should not: ? Pinch your toes. ? Fit tightly against your toes.  Do not use any tobacco products, including cigarettes, chewing tobacco, or e-cigarettes. These can delay bone healing. If you need help quitting, ask your doctor.  Take medicines only as told by your doctor.  Keep all follow-up visits as told by your doctor. This is important. Contact a doctor if:  Your pain medicine is not helping.  You have a fever.  You notice a bad smell coming from your cast. Get help right away if:  You lose feeling (have numbness) in your toe or foot, and it is getting worse.  Your toe or your foot tingles.  Your toe or your foot gets cold or turns blue.  You have redness or swelling in your toe or  foot, and it is getting worse.  You have very bad pain. Summary  A toe fracture is a break in one of the toe bones.  Use ice and raise your foot. This will help lessen pain and swelling.  Use crutches as told by your doctor. This information is not intended to replace advice given to you by your health care provider. Make sure you discuss any questions you have with your health care provider. Document Released: 06/03/2008 Document Revised: 02/19/2018 Document Reviewed: 01/27/2018 Elsevier Patient Education  2020 [employer].

## 2019-10-05 NOTE — Progress Notes (Signed)
Patient: Clifford Aguilar Male    DOB: 06/15/1941   78 y.o.   MRN: [id number] Visit Date: 10/05/2019  Today's Provider: Trinna Post, PA-C   Chief Complaint  Patient presents with  . Toe Pain    Pt stubbed his toe on the corner of a door on 10/01/2019   Subjective:   Patient with history of NSTEMI and coronary artery disease on Xarelto and Plavix presenting today for stubbed right great toe. This happened several days ago and patient experienced pain and swelling after this. Reports pain and swelling have lessened somewhat but there is still bruising. He finds it difficult to walk due to pain.   Toe Pain  The incident occurred 5 to 7 days ago. The incident occurred at home. The injury mechanism was a direct blow. Pain location: Right big toe. The pain has been improving since onset. Pertinent negatives include no inability to bear weight, loss of motion, loss of sensation, numbness or tingling. He reports no foreign bodies present. The symptoms are aggravated by palpation and weight bearing.    Allergies  Allergen Reactions  . Hydralazine Rash  . Clonidine Derivatives Other (See Comments)    Caused blindness in left eye for the days on the drug  . Adhesive [Tape]     SKIN SENSITIVITY  . Isosorbide     Other reaction(s): Other (See Comments)  . Latex Other (See Comments)  . Trazodone Other (See Comments)    Cognitive impairment   . Benazepril Other (See Comments)    Nausea, vomiting and diarrhea  . Carvedilol Other (See Comments)    Blurred vision  . Hydrazine Yellow [Tartrazine] Itching and Rash  . Isosorbide Dinitrate Rash  . Isosorbide Mononitrate Rash  . Lisinopril Other (See Comments)    Blurred vision   . Losartan Other (See Comments)    Blurry vision if doses>25mg . Only use it for his BP is >170     Current Outpatient Medications:  .  atorvastatin (LIPITOR) 40 MG tablet, Take 1 tablet by mouth daily at 6pm, Disp: 90 tablet, Rfl: 2 .  clopidogrel (PLAVIX)  75 MG tablet, TAKE 1 TABLET DAILY, Disp: 90 tablet, Rfl: 3 .  Coenzyme Q10 (CO Q-10) 100 MG CAPS, Take 100 mg by mouth daily., Disp: , Rfl:  .  dexlansoprazole (DEXILANT) 60 MG capsule, Take 1 capsule (60 mg total) by mouth daily., Disp: 90 capsule, Rfl: 3 .  doxazosin (CARDURA) 8 MG tablet, TAKE ONE TABLET BY MOUTH TWICE DAILY, Disp: 180 tablet, Rfl: 3 .  ezetimibe (ZETIA) 10 MG tablet, Take 1 tablet (10 mg total) by mouth daily., Disp: 90 tablet, Rfl: 3 .  Garlic 123XX123 MG CAPS, Take 7,000 mg by mouth. , Disp: , Rfl:  .  hydrochlorothiazide (HYDRODIURIL) 25 MG tablet, Take 1 tablet (25 mg total) by mouth daily., Disp: 90 tablet, Rfl: 3 .  hyoscyamine (LEVSIN SL) 0.125 MG SL tablet, Place 1 tablet (0.125 mg total) under the tongue every 4 (four) hours as needed., Disp: 60 tablet, Rfl: 0 .  lansoprazole (PREVACID) 15 MG capsule, Take 1 capsule (15 mg total) by mouth daily at 12 noon., Disp: 30 capsule, Rfl: 6 .  levothyroxine (SYNTHROID, LEVOTHROID) 25 MCG tablet, Take 1 tablet (25 mcg total) by mouth daily before breakfast., Disp: 90 tablet, Rfl: 3 .  losartan (COZAAR) 25 MG tablet, Take by mouth. As needed, Disp: , Rfl:  .  Melatonin 3 MG TABS, Take 6 mg by  mouth at bedtime., Disp: , Rfl:  .  metoprolol tartrate (LOPRESSOR) 25 MG tablet, Take 0.5 tablets (12.5 mg total) by mouth 2 (two) times daily., Disp: 90 tablet, Rfl: 3 .  nitroGLYCERIN (NITROLINGUAL) 0.4 MG/SPRAY spray, Place 1 spray under the tongue every 5 (five) minutes x 3 doses as needed for chest pain., Disp: 12 g, Rfl: 3 .  polyethylene glycol (MIRALAX / GLYCOLAX) packet, Take 17 g by mouth daily., Disp: 90 packet, Rfl: 3 .  RABEprazole (ACIPHEX) 20 MG tablet, Take 1 tablet (20 mg total) by mouth daily., Disp: 30 tablet, Rfl: 0 .  rivaroxaban (XARELTO) 20 MG TABS tablet, Take 1 tablet (20 mg total) by mouth daily with supper., Disp: 90 tablet, Rfl: 3  Review of Systems  Constitutional: Negative.   Musculoskeletal: Positive for  arthralgias and joint swelling. Negative for back pain, gait problem, myalgias, neck pain and neck stiffness.  Neurological: Negative for tingling and numbness.    Social History   Tobacco Use  . Smoking status: Former Smoker    Types: Cigarettes    Quit date: 12/30/1990    Years since quitting: 28.7  . Smokeless tobacco: Former Systems developer    Quit date: 01/10/1991  . Tobacco comment: quit smoking in 1992  Substance Use Topics  . Alcohol use: Yes    Alcohol/week: 1.0 - 2.0 standard drinks    Types: 1 - 2 Glasses of wine per week    Comment: 1-2 a week ,beer yesterday      Objective:   BP (!) 115/53 (BP Location: Right Arm, Patient Position: Sitting, Cuff Size: Normal)   Pulse (!) 56   Temp (!) 96.8 F (36 C) (Temporal)   Wt 181 lb (82.1 kg)   BMI 26.73 kg/m  Vitals:   10/05/19 1122  BP: (!) 115/53  Pulse: (!) 56  Temp: (!) 96.8 F (36 C)  TempSrc: Temporal  Weight: 181 lb (82.1 kg)  Body mass index is 26.73 kg/m.   Physical Exam Feet:     Comments: Significant bruising in great right toe, please see image. Toenail is yellowed and hypertrophy but remains affixed. No active bleeding, warmth, or purulence.  Skin:    General: Skin is warm and dry.  Neurological:     Mental Status: He is oriented to person, place, and time. Mental status is at baseline.  Psychiatric:        Mood and Affect: Mood normal.        Behavior: Behavior normal.     Media Information    Document Information  Photos  Right foot   10/05/2019 11:38  Attached To:  Office Visit on 10/05/19 with Trinna Post, PA-C  Source Information  Carles Collet M, PA-C  [facility]    No results found for any visits on 10/05/19.     Assessment & Plan    1. Right foot pain  Xray to r/o fracture. Significant bruising due to blood thinner and anti-platelet. Nail remains attached but counseled patient it is not unusual for toenail to fall off after trauma like this. He reports pain is  manageable at this point.   - DG Foot Complete Right; Future  The entirety of the information documented in the History of Present Illness, Review of Systems and Physical Exam were personally obtained by me. Portions of this information were initially documented by Ashley Royalty, CMA and reviewed by me for thoroughness and accuracy.   F/u PRN     Trinna Post,  PA-C  [facility]

## 2019-10-06 ENCOUNTER — Telehealth: Payer: Self-pay

## 2019-10-06 NOTE — Telephone Encounter (Signed)
-----   Message from Trinna Post, [place] sent at 10/06/2019  3:30 PM EDT ----- His toe is not broken. It looks like there is a metallic foreign body but not in the location of his toe, it is more on the inside of his ankle. I would anticipate the bruising to resolve over the next couple of weeks.

## 2019-10-06 NOTE — Addendum Note (Signed)
Addended by: Trinna Post on: 10/06/2019 03:51 PM   Modules accepted: Orders

## 2019-10-06 NOTE — Telephone Encounter (Signed)
Patient advised as below. Patient wants to know if there is anything to do about the metal. Patient also wanted to know if you were going to refer him to [facility]?

## 2019-10-06 NOTE — Telephone Encounter (Signed)
Placed that referral for him. I am not really sure what that is on the xray, it is unrelated to his toe. It may have been there for a long time. There's no mention of it in his previous foot xrays from 2012 and 2013. Does he experience any pain or discomfort in that area? There may not be a need to remove it if he is not having discomfort. His foot doctor may be able to offer more insight and advice on the matter.

## 2019-10-06 NOTE — Telephone Encounter (Signed)
Patient advised as below. Patient verbalizes understanding and is in agreement with treatment plan.  

## 2019-10-27 ENCOUNTER — Other Ambulatory Visit: Payer: Self-pay | Admitting: Physician Assistant

## 2019-10-27 DIAGNOSIS — E039 Hypothyroidism, unspecified: Secondary | ICD-10-CM

## 2019-10-27 MED ORDER — LEVOTHYROXINE SODIUM 25 MCG PO TABS: 25.0000 ug | ORAL_TABLET | Freq: Every day | ORAL | 1 refills | Status: DC

## 2019-10-27 NOTE — Telephone Encounter (Signed)
L.O.V. was 10/05/2019.

## 2019-10-27 NOTE — Telephone Encounter (Signed)
[facility] faxed refill request for the following medications:  levothyroxine (SYNTHROID, LEVOTHROID) 25 MCG tablet    Please advise.

## 2019-10-28 ENCOUNTER — Other Ambulatory Visit: Payer: Self-pay

## 2019-10-28 MED ORDER — HYOSCYAMINE SULFATE 0.125 MG SL SUBL: 0.1250 mg | SUBLINGUAL_TABLET | SUBLINGUAL | 0 refills | Status: DC | PRN

## 2019-10-29 ENCOUNTER — Other Ambulatory Visit: Payer: Self-pay | Admitting: *Deleted

## 2019-10-29 DIAGNOSIS — Z20822 Contact with and (suspected) exposure to covid-19: Secondary | ICD-10-CM

## 2019-10-30 LAB — NOVEL CORONAVIRUS, NAA: SARS-CoV-2, NAA: NOT DETECTED

## 2019-11-06 DIAGNOSIS — S60450A Superficial foreign body of right index finger, initial encounter: Secondary | ICD-10-CM | POA: Diagnosis not present

## 2019-11-29 ENCOUNTER — Telehealth: Payer: Self-pay | Admitting: Cardiovascular Disease

## 2019-11-29 NOTE — Telephone Encounter (Signed)
° °  [facility] Pre-operative Risk Assessment    Request for surgical clearance:  1. What type of surgery is being performed? R index finger foreign body excision under MAC with block anesthesia    2. When is this surgery scheduled? 12/16/19   3. What type of clearance is required (medical clearance vs. Pharmacy clearance to hold med vs. Both)? BOTH  4. Are there any medications that need to be held prior to surgery and how long?Please advise    5. Practice name and name of physician performing surgery?  [facility] Dr. Rhona Raider   6. What is your office phone number [phone]    7.   What is your office fax number [phone]  8.   Anesthesia type (None, local, MAC, general) ? MAC with Block    Clifford Aguilar 11/29/2019, 4:48 PM  _________________________________________________________________   (provider comments below)

## 2019-11-30 NOTE — Telephone Encounter (Signed)
Dr. Rockey Situ, this patient has history of remote CABG, cath in 11/2018 with patent LIMA-LAD, RIMA-RCA, SVG-ramus. Jump SVG-OM chronically occluded. No intervention done. He also has carotid artery disease, stable, and PVD.  He is planned for R index finger foreign body excision under MAC with block anesthesia.  Can Plavix be held for the procedure? Pharmacy has provided recommendations Xarelto (afib).   Please route response back to P [facility] PREOP

## 2019-11-30 NOTE — Telephone Encounter (Signed)
Okay to hold Xarelto 2 days prior to procedure He can stop Plavix 5 days before procedure but on those days would take aspirin 81 mg daily Then go back to his usual medications following the procedure

## 2019-11-30 NOTE — Telephone Encounter (Signed)
I called to update pt status since last office visit in 06/2019. No answer. Left VM for pt to call back and ask for preop clinic.

## 2019-11-30 NOTE — Telephone Encounter (Signed)
We need to know if the surgeon is requesting to hold Plavix and Xarelto. This seems like a simple procedure and may possibly be done on anticaogulation but we cannot advise on unknown request.   Preop callback staff please clarify with the requesting provider whether they would like to hold Xarelto and/or Plavix.  If Plavix needs to be held, we will have this approved by Dr. Rockey Situ.

## 2019-11-30 NOTE — Telephone Encounter (Signed)
Patient with diagnosis of afib on Xarelto for anticoagulation.    Procedure: R index finger foreign body excision under MAC with block anesthesia   Date of procedure: 12/16/2019  CHADS2-VASc score of  4 (HTN, AGE, CAD, AGE)  CrCl 89 ml/min  Per office protocol, patient can hold Xarelto for 2 days prior to procedure, should hold 3 days if using a spinal block.

## 2019-11-30 NOTE — Telephone Encounter (Signed)
Per Pre OP Team I called [facility] for confirmation if needing both Plavix and Xarelto to be held. I s/w Wells Guiles at [facility] ( Dr. Rhona Raider) who states that their office leaves the medications that need to be held up to the Cardiologist. Wells Guiles did state the Anesthesiologist generally prefers medications such as Plavix and Xarelto to be held. I did state that we do like to know from the requesting providers office as to which medications they may feel may need to possibly be held. The Cardiologist will then review and give recommendations as to holding medications. I assured her that I will send my note to the Cardiologist for review. I thanked her for her help. I will send the update to the Pre OP Team.

## 2019-12-01 NOTE — Telephone Encounter (Signed)
I will route surgery clearance to Dr. Rhona Raider. I will remove from the Pre OP Call back pool.

## 2019-12-01 NOTE — Telephone Encounter (Signed)
   Primary Cardiologist: Ida Rogue, MD  Chart reviewed as part of pre-operative protocol coverage. Patient was contacted 12/01/2019 in reference to pre-operative risk assessment for pending surgery as outlined below.  Clifford Aguilar was last seen on 07/26/2019 by Dr. Rockey Situ.  Since that day, Clifford Aguilar has done very well. He walks on a treadmill for 30-40 minutes every day with no exertional symptoms.   According to our pharmacy protocol: Patient with diagnosis of afib on Xarelto for anticoagulation.  CHADS2-VASc score of  4 (HTN, AGE, CAD, AGE) CrCl 89 ml/min  Per office protocol, patient can hold Xarelto for 2 days prior to procedure, should hold 3 days if using a spinal block.    Per Dr. Rockey Situ: Faythe Ghee to hold Xarelto 2 days prior to procedure He can stop Plavix 5 days before procedure but on those days would take aspirin 81 mg daily Then go back to his usual medications following the procedure I have explained this to the patient and his wife.   Therefore, based on ACC/AHA guidelines, the patient would be at acceptable risk for the planned procedure without further cardiovascular testing.   I will route this recommendation to the requesting party via Epic fax function and remove from pre-op pool.  Please call with questions.  Daune Perch, NP 12/01/2019, 3:29 PM

## 2019-12-01 NOTE — Telephone Encounter (Signed)
VM left for pt to call back about clearance.

## 2019-12-02 DIAGNOSIS — C44311 Basal cell carcinoma of skin of nose: Secondary | ICD-10-CM | POA: Diagnosis not present

## 2019-12-02 DIAGNOSIS — D2261 Melanocytic nevi of right upper limb, including shoulder: Secondary | ICD-10-CM | POA: Diagnosis not present

## 2019-12-02 DIAGNOSIS — X32XXXA Exposure to sunlight, initial encounter: Secondary | ICD-10-CM | POA: Diagnosis not present

## 2019-12-02 DIAGNOSIS — D2271 Melanocytic nevi of right lower limb, including hip: Secondary | ICD-10-CM | POA: Diagnosis not present

## 2019-12-02 DIAGNOSIS — D485 Neoplasm of uncertain behavior of skin: Secondary | ICD-10-CM | POA: Diagnosis not present

## 2019-12-02 DIAGNOSIS — Z08 Encounter for follow-up examination after completed treatment for malignant neoplasm: Secondary | ICD-10-CM | POA: Diagnosis not present

## 2019-12-02 DIAGNOSIS — L570 Actinic keratosis: Secondary | ICD-10-CM | POA: Diagnosis not present

## 2019-12-02 DIAGNOSIS — Z85828 Personal history of other malignant neoplasm of skin: Secondary | ICD-10-CM | POA: Diagnosis not present

## 2019-12-02 DIAGNOSIS — D2262 Melanocytic nevi of left upper limb, including shoulder: Secondary | ICD-10-CM | POA: Diagnosis not present

## 2019-12-06 ENCOUNTER — Encounter: Payer: Self-pay | Admitting: Physician Assistant

## 2019-12-06 ENCOUNTER — Other Ambulatory Visit: Payer: Self-pay | Admitting: Physician Assistant

## 2019-12-06 ENCOUNTER — Other Ambulatory Visit: Payer: Self-pay

## 2019-12-06 ENCOUNTER — Ambulatory Visit (INDEPENDENT_AMBULATORY_CARE_PROVIDER_SITE_OTHER): Payer: PPO | Admitting: Physician Assistant

## 2019-12-06 VITALS — BP 138/78 | Temp 96.6°F | Wt 182.0 lb

## 2019-12-06 DIAGNOSIS — D649 Anemia, unspecified: Secondary | ICD-10-CM | POA: Diagnosis not present

## 2019-12-06 DIAGNOSIS — R7303 Prediabetes: Secondary | ICD-10-CM

## 2019-12-06 DIAGNOSIS — Z01818 Encounter for other preprocedural examination: Secondary | ICD-10-CM | POA: Diagnosis not present

## 2019-12-06 DIAGNOSIS — I10 Essential (primary) hypertension: Secondary | ICD-10-CM

## 2019-12-06 NOTE — Patient Instructions (Signed)

## 2019-12-06 NOTE — Progress Notes (Signed)
Patient: Clifford KOZIKOWSKI Male    DOB: 08-17-1941   78 y.o.   MRN: [id number] Visit Date: 12/06/2019  Today's Provider: Trinna Post, PA-C   Chief Complaint  Patient presents with  . Pre-op Exam   Subjective:     HPI  Pre-op Exam Patient presents today for his pre-op exam. He is undergoing right index finger foreign body excision under MAC with block anesthesia. The surgeon is Dr. Rhona Raider with [facility].  He has a history of CABG, NSTEMI, HTN, and paroxysmal a-fib. He is followed by Dr. Rockey Situ with [facility]. He is on multiple cardiac medications which are listed below. He is also on Xarelto, Plavix and aspirin for chronic anticoagulation.   He reports a history of sleep apnea. He is moderately compliant with CPAP.   He denies a history of issues with anesthesia.   He has a history of prediabetes which has been managed with diet.   Lab Results  Component Value Date   HGBA1C 6.0 (H) 05/11/2019   He has a history of normocytic anemia and hemoglobin has been stable at 12. EGD 07/13/2019 was normal and iron panel has been normal as well. Colonoscopy 2017 showed one 4 mm polyp.   He currently denies chest pain, SOB, cough, fever, chills.    Allergies  Allergen Reactions  . Hydralazine Rash  . Clonidine Derivatives Other (See Comments)    Caused blindness in left eye for the days on the drug  . Adhesive [Tape]     SKIN SENSITIVITY  . Isosorbide     Other reaction(s): Other (See Comments)  . Latex Other (See Comments)  . Trazodone Other (See Comments)    Cognitive impairment   . Benazepril Other (See Comments)    Nausea, vomiting and diarrhea  . Carvedilol Other (See Comments)    Blurred vision  . Hydrazine Yellow [Tartrazine] Itching and Rash  . Isosorbide Dinitrate Rash  . Isosorbide Mononitrate Rash  . Lisinopril Other (See Comments)    Blurred vision   . Losartan Other (See Comments)    Blurry vision if doses>29m. Only use it  for his BP is >170     Current Outpatient Medications:  .  atorvastatin (LIPITOR) 40 MG tablet, Take 1 tablet by mouth daily at 6pm, Disp: 90 tablet, Rfl: 2 .  clopidogrel (PLAVIX) 75 MG tablet, TAKE 1 TABLET DAILY, Disp: 90 tablet, Rfl: 3 .  Coenzyme Q10 (CO Q-10) 100 MG CAPS, Take 100 mg by mouth daily., Disp: , Rfl:  .  doxazosin (CARDURA) 8 MG tablet, TAKE ONE TABLET BY MOUTH TWICE DAILY, Disp: 180 tablet, Rfl: 3 .  ezetimibe (ZETIA) 10 MG tablet, Take 1 tablet (10 mg total) by mouth daily., Disp: 90 tablet, Rfl: 3 .  Garlic 14098MG CAPS, Take 7,000 mg by mouth. , Disp: , Rfl:  .  hydrochlorothiazide (HYDRODIURIL) 25 MG tablet, Take 1 tablet (25 mg total) by mouth daily., Disp: 90 tablet, Rfl: 3 .  lansoprazole (PREVACID) 15 MG capsule, Take 1 capsule (15 mg total) by mouth daily at 12 noon., Disp: 30 capsule, Rfl: 6 .  levothyroxine (SYNTHROID) 25 MCG tablet, Take 1 tablet (25 mcg total) by mouth daily before breakfast., Disp: 90 tablet, Rfl: 1 .  losartan (COZAAR) 25 MG tablet, Take by mouth. As needed, Disp: , Rfl:  .  metoprolol tartrate (LOPRESSOR) 25 MG tablet, Take 0.5 tablets (12.5 mg total) by mouth 2 (two) times daily., Disp: 90  tablet, Rfl: 3 .  nitroGLYCERIN (NITROLINGUAL) 0.4 MG/SPRAY spray, Place 1 spray under the tongue every 5 (five) minutes x 3 doses as needed for chest pain., Disp: 12 g, Rfl: 3 .  polyethylene glycol (MIRALAX / GLYCOLAX) packet, Take 17 g by mouth daily., Disp: 90 packet, Rfl: 3 .  rivaroxaban (XARELTO) 20 MG TABS tablet, Take 1 tablet (20 mg total) by mouth daily with supper., Disp: 90 tablet, Rfl: 3 .  dexlansoprazole (DEXILANT) 60 MG capsule, Take 1 capsule (60 mg total) by mouth daily. (Patient not taking: Reported on 10/05/2019), Disp: 90 capsule, Rfl: 3 .  hyoscyamine (LEVSIN SL) 0.125 MG SL tablet, Place 1 tablet (0.125 mg total) under the tongue every 4 (four) hours as needed., Disp: 60 tablet, Rfl: 0 .  Melatonin 3 MG TABS, Take 6 mg by mouth at  bedtime., Disp: , Rfl:  .  RABEprazole (ACIPHEX) 20 MG tablet, Take 1 tablet (20 mg total) by mouth daily. (Patient not taking: Reported on 10/05/2019), Disp: 30 tablet, Rfl: 0  Review of Systems  Constitutional: Negative.   Respiratory: Negative.   Cardiovascular: Negative.   Neurological: Negative.     Social History   Tobacco Use  . Smoking status: Former Smoker    Types: Cigarettes    Quit date: 12/30/1990    Years since quitting: 28.9  . Smokeless tobacco: Former Systems developer    Quit date: 01/10/1991  . Tobacco comment: quit smoking in 1992  Substance Use Topics  . Alcohol use: Yes    Alcohol/week: 1.0 - 2.0 standard drinks    Types: 1 - 2 Glasses of wine per week    Comment: 1-2 a week ,beer yesterday      Objective:   BP 138/78 (BP Location: Right Arm, Patient Position: Sitting, Cuff Size: Normal)   Temp (!) 96.6 F (35.9 C) (Temporal)   Wt 182 lb (82.6 kg)   BMI 26.88 kg/m  Vitals:   12/06/19 1505  BP: 138/78  Temp: (!) 96.6 F (35.9 C)  TempSrc: Temporal  Weight: 182 lb (82.6 kg)  Body mass index is 26.88 kg/m.   Physical Exam Constitutional:      Appearance: Normal appearance.  Cardiovascular:     Rate and Rhythm: Normal rate and regular rhythm.     Heart sounds: Normal heart sounds.  Pulmonary:     Effort: Pulmonary effort is normal.     Breath sounds: Normal breath sounds.  Skin:    General: Skin is warm and dry.  Neurological:     Mental Status: He is alert and oriented to person, place, and time. Mental status is at baseline.  Psychiatric:        Mood and Affect: Mood normal.        Behavior: Behavior normal.      No results found for any visits on 12/06/19.     Assessment & Plan    1. Pre-op examination  Pending normal updated labs, I will clear him medically and defer to cardiology to clear cardiac. Per cardiology communications he is to stop Plavix 5 days prior surgery and Xarelto 2 days prior to surgery. He is to continue aspirin 81 mg  daily until day of procedure. After procedures he may resume medications as normal.   - EKG 12-Lead  2. Prediabetes  - HgB A1c  3. Essential hypertension  - Comprehensive Metabolic Panel (CMET)  4. Anemia, unspecified type  - CBC with Differential  The entirety of the information documented in the  History of Present Illness, Review of Systems and Physical Exam were personally obtained by me. Portions of this information were initially documented by [facility], CMA and reviewed by me for thoroughness and accuracy.   I have spent 25 minutes with this patient, >50% of which was spent on counseling and coordination of care.   F/uPRN       Trinna Post, PA-C  [facility]

## 2019-12-07 ENCOUNTER — Telehealth: Payer: Self-pay

## 2019-12-07 LAB — HEMOGLOBIN A1C
Est. average glucose Bld gHb Est-mCnc: 120 mg/dL
Hgb A1c MFr Bld: 5.8 % — ABNORMAL HIGH (ref 4.8–5.6)

## 2019-12-07 LAB — COMPREHENSIVE METABOLIC PANEL
ALT: 13 IU/L (ref 0–44)
AST: 17 IU/L (ref 0–40)
Albumin/Globulin Ratio: 1.9 (ref 1.2–2.2)
Albumin: 3.9 g/dL (ref 3.7–4.7)
Alkaline Phosphatase: 76 IU/L (ref 39–117)
BUN/Creatinine Ratio: 16 (ref 10–24)
BUN: 14 mg/dL (ref 8–27)
Bilirubin Total: 0.4 mg/dL (ref 0.0–1.2)
CO2: 23 mmol/L (ref 20–29)
Calcium: 9.2 mg/dL (ref 8.6–10.2)
Chloride: 98 mmol/L (ref 96–106)
Creatinine, Ser: 0.88 mg/dL (ref 0.76–1.27)
GFR calc Af Amer: 96 mL/min/{1.73_m2} (ref >59)
GFR calc non Af Amer: 83 mL/min/{1.73_m2} (ref >59)
Globulin, Total: 2.1 g/dL (ref 1.5–4.5)
Glucose: 119 mg/dL — ABNORMAL HIGH (ref 65–99)
Potassium: 4.2 mmol/L (ref 3.5–5.2)
Sodium: 134 mmol/L (ref 134–144)
Total Protein: 6 g/dL (ref 6.0–8.5)

## 2019-12-07 LAB — CBC WITH DIFFERENTIAL/PLATELET
Basophils Absolute: 0 10*3/uL (ref 0.0–0.2)
Basos: 1 %
EOS (ABSOLUTE): 0.1 10*3/uL (ref 0.0–0.4)
Eos: 2 %
Hematocrit: 36.2 % — ABNORMAL LOW (ref 37.5–51.0)
Hemoglobin: 12 g/dL — ABNORMAL LOW (ref 13.0–17.7)
Immature Grans (Abs): 0 10*3/uL (ref 0.0–0.1)
Immature Granulocytes: 1 %
Lymphocytes Absolute: 1.7 10*3/uL (ref 0.7–3.1)
Lymphs: 25 %
MCH: 29.9 pg (ref 26.6–33.0)
MCHC: 33.1 g/dL (ref 31.5–35.7)
MCV: 90 fL (ref 79–97)
Monocytes Absolute: 0.8 10*3/uL (ref 0.1–0.9)
Monocytes: 12 %
Neutrophils Absolute: 4.1 10*3/uL (ref 1.4–7.0)
Neutrophils: 59 %
Platelets: 191 10*3/uL (ref 150–450)
RBC: 4.01 x10E6/uL — ABNORMAL LOW (ref 4.14–5.80)
RDW: 13.1 % (ref 11.6–15.4)
WBC: 6.7 10*3/uL (ref 3.4–10.8)

## 2019-12-07 NOTE — Telephone Encounter (Signed)
-----   Message from Trinna Post, [place] sent at 12/07/2019 11:29 AM EST ----- Labs are stable. I have completed surgical clearance and we will fax this form to the orthopedic doctor.

## 2019-12-07 NOTE — Telephone Encounter (Signed)
Patient's wife Elder Negus) was advised and states that she will advised the patient.

## 2019-12-16 DIAGNOSIS — M795 Residual foreign body in soft tissue: Secondary | ICD-10-CM | POA: Diagnosis not present

## 2019-12-16 DIAGNOSIS — S60450A Superficial foreign body of right index finger, initial encounter: Secondary | ICD-10-CM | POA: Diagnosis not present

## 2019-12-20 ENCOUNTER — Telehealth: Payer: Self-pay | Admitting: Cardiovascular Disease

## 2019-12-20 DIAGNOSIS — S60450A Superficial foreign body of right index finger, initial encounter: Secondary | ICD-10-CM | POA: Diagnosis not present

## 2019-12-20 NOTE — Telephone Encounter (Signed)
Spoke with patients wife per release form and advised that I have not heard of this effect. She reports that he does have upcoming GI appointment in a week and will check with them since he has some GI issues. She was appreciative for the call with no further questions at this time.

## 2019-12-20 NOTE — Telephone Encounter (Signed)
Pt c/o medication issue:  1. Name of Medication: metoprolol   2. How are you currently taking this medication (dosage and times per day)?  12.5 mg po BID   3. Are you having a reaction (difficulty breathing--STAT)? GI cramping   4. What is your medication issue?  Patient wife calling to see if GI cramping can caused by metoprolol

## 2019-12-21 NOTE — Telephone Encounter (Signed)
Patient wife calling in again stating patient was sick again yesterday. Patient's wife is wondering if there may be an alternative to the metoprolol to see if that is causing the sickness. Please advise when able

## 2019-12-21 NOTE — Telephone Encounter (Signed)
Called back and spoke with the pt and he reports that he is fine all day until he eats lunch and that is when his stomach cramping starts.. he says a few days ago it was so bad he almost went to the ED but it subsided.. he denies pain/ discomfort in his chest and no dizziness or SOB.Marland Kitchen he put his wife Vicente Males on the phone and she says all of this started about 3 months after staring the metoprolol and she is worried it could be contributing to his symptoms.. I have strongly urged her to call the GI MD that he sees and if his 12/28/19 appt can be made sooner but if not, to at least report his symptoms to them and for their recommendations. She agreed and will let us know how he is doing.

## 2019-12-23 DIAGNOSIS — S60450A Superficial foreign body of right index finger, initial encounter: Secondary | ICD-10-CM | POA: Diagnosis not present

## 2019-12-28 ENCOUNTER — Ambulatory Visit (INDEPENDENT_AMBULATORY_CARE_PROVIDER_SITE_OTHER): Payer: PPO | Admitting: Gastroenterology

## 2019-12-28 ENCOUNTER — Other Ambulatory Visit: Payer: Self-pay

## 2019-12-28 ENCOUNTER — Encounter: Payer: Self-pay | Admitting: Gastroenterology

## 2019-12-28 VITALS — BP 150/83 | HR 66 | Temp 97.9°F | Ht 69.0 in | Wt 181.6 lb

## 2019-12-28 DIAGNOSIS — R1033 Periumbilical pain: Secondary | ICD-10-CM | POA: Diagnosis not present

## 2019-12-28 MED ORDER — DICYCLOMINE HCL 20 MG PO TABS: 20.0000 mg | ORAL_TABLET | Freq: Four times a day (QID) | ORAL | 0 refills | Status: DC

## 2019-12-28 NOTE — Progress Notes (Signed)
Primary Care Physician: Paulene Floor  Primary Gastroenterologist:  Dr. [name]  Chief Complaint  Patient presents with  . Abdominal Cramping    HPI: Clifford Aguilar is a 78 y.o. male here here for follow-up of abdominal discomfort.  The patient is seen me in the past with abdominal discomfort and was also seeing me for GERD.  His symptoms in the past have been associated with cramps in the lower abdomen after dinner and usually not throughout the day.  He was tried on Levsin and was also told to see if his pains were any better or worse off of the PPI. The patient reports that he has been under a lot of stress recently with both his daughters.  One daughter is an abusive relationship while the other daughter is on dialysis.  He reports that the Levsin helped his symptoms sometimes but at other times was not helping him.  He does report that is always after eating and usually later in the day.  There is no report of any black stools or bloody stools.  He does have these episodes of when he has the pain he starts to have some diarrhea with some nausea associated with the pain.  He is not having any the pain at present time.  Current Outpatient Medications  Medication Sig Dispense Refill  . atorvastatin (LIPITOR) 40 MG tablet Take 1 tablet by mouth daily at 6pm 90 tablet 2  . clopidogrel (PLAVIX) 75 MG tablet TAKE 1 TABLET DAILY 90 tablet 3  . Coenzyme Q10 (CO Q-10) 100 MG CAPS Take 100 mg by mouth daily.    Marland Kitchen doxazosin (CARDURA) 8 MG tablet TAKE ONE TABLET BY MOUTH TWICE DAILY 180 tablet 3  . ezetimibe (ZETIA) 10 MG tablet Take 1 tablet (10 mg total) by mouth daily. 90 tablet 3  . Garlic 123XX123 MG CAPS Take 7,000 mg by mouth.     . hydrochlorothiazide (HYDRODIURIL) 25 MG tablet Take 1 tablet (25 mg total) by mouth daily. 90 tablet 3  . lansoprazole (PREVACID) 15 MG capsule Take 1 capsule (15 mg total) by mouth daily at 12 noon. 30 capsule 6  . levothyroxine (SYNTHROID) 25 MCG  tablet Take 1 tablet (25 mcg total) by mouth daily before breakfast. 90 tablet 1  . losartan (COZAAR) 25 MG tablet Take by mouth. As needed    . metoprolol tartrate (LOPRESSOR) 25 MG tablet Take 0.5 tablets (12.5 mg total) by mouth 2 (two) times daily. 90 tablet 3  . nitroGLYCERIN (NITROLINGUAL) 0.4 MG/SPRAY spray Place 1 spray under the tongue every 5 (five) minutes x 3 doses as needed for chest pain. 12 g 3  . polyethylene glycol (MIRALAX / GLYCOLAX) packet Take 17 g by mouth daily. 90 packet 3  . rivaroxaban (XARELTO) 20 MG TABS tablet Take 1 tablet (20 mg total) by mouth daily with supper. 90 tablet 3  . dexlansoprazole (DEXILANT) 60 MG capsule Take 1 capsule (60 mg total) by mouth daily. (Patient not taking: Reported on 10/05/2019) 90 capsule 3  . dicyclomine (BENTYL) 20 MG tablet Take 1 tablet (20 mg total) by mouth every 6 (six) hours. 120 tablet 0  . hyoscyamine (LEVSIN SL) 0.125 MG SL tablet Place 1 tablet (0.125 mg total) under the tongue every 4 (four) hours as needed. 60 tablet 0  . Melatonin 3 MG TABS Take 6 mg by mouth at bedtime.    . RABEprazole (ACIPHEX) 20 MG tablet Take 1 tablet (20 mg  total) by mouth daily. (Patient not taking: Reported on 10/05/2019) 30 tablet 0   No current facility-administered medications for this visit.    Allergies as of 12/28/2019 - Review Complete 12/28/2019  Allergen Reaction Noted  . Hydralazine Rash 03/16/2014  . Clonidine derivatives Other (See Comments) 11/27/2011  . Adhesive [tape]    . Isosorbide  08/13/2017  . Latex Other (See Comments) 08/13/2017  . Trazodone Other (See Comments) 02/16/2018  . Benazepril Other (See Comments) 08/19/2016  . Carvedilol Other (See Comments) 01/27/2014  . Hydrazine yellow [tartrazine] Itching and Rash 03/03/2014  . Isosorbide dinitrate Rash 03/03/2014  . Isosorbide mononitrate Rash 11/26/2011  . Lisinopril Other (See Comments) 06/29/2012  . Losartan Other (See Comments) 03/17/2017    ROS:  General:  Negative for anorexia, weight loss, fever, chills, fatigue, weakness. ENT: Negative for hoarseness, difficulty swallowing , nasal congestion. CV: Negative for chest pain, angina, palpitations, dyspnea on exertion, peripheral edema.  Respiratory: Negative for dyspnea at rest, dyspnea on exertion, cough, sputum, wheezing.  GI: See history of present illness. GU:  Negative for dysuria, hematuria, urinary incontinence, urinary frequency, nocturnal urination.  Endo: Negative for unusual weight change.    Physical Examination:   BP (!) 150/83   Pulse 66   Temp 97.9 F (36.6 C) (Oral)   Ht 5\' 9"  (1.753 m)   Wt 181 lb 9.6 oz (82.4 kg)   BMI 26.82 kg/m   General: Well-nourished, well-developed in no acute distress.  Eyes: No icterus. Conjunctivae pink. Lungs: Clear to auscultation bilaterally. Non-labored. Heart: Regular rate and rhythm, no murmurs rubs or gallops.  Abdomen: Bowel sounds are normal, nontender, nondistended, no hepatosplenomegaly or masses, no abdominal bruits or hernia , no rebound or guarding.   Extremities: No lower extremity edema. No clubbing or deformities. Neuro: Alert and oriented x 3.  Grossly intact. Skin: Warm and dry, no jaundice.   Psych: Alert and cooperative, normal mood and affect.  Labs:    Imaging Studies: No results found.  Assessment and Plan:   Clifford Aguilar is a 77 y.o. y/o male who comes in today with a history of lower abdominal cramps and he reports that he did get some relief with Levsin but at other times was not being helped by it.  He is under a lot of stress with issues with both of his children.  The patient likely has intestinal spasms as a cause of his symptoms.  This is likely exacerbated by stress.  The patient will be started on dicyclomine 20 mg 3 times a day.  If the symptoms do not improve the patient may need imaging of his abdominal area with a CT scan.  The patient has been explained the plan and agrees with it.     [name], MD. Marval Regal    Note: This dictation was prepared with Dragon dictation along with smaller phrase technology. Any transcriptional errors that result from this process are unintentional.

## 2020-01-03 DIAGNOSIS — Z9889 Other specified postprocedural states: Secondary | ICD-10-CM | POA: Diagnosis not present

## 2020-01-05 ENCOUNTER — Other Ambulatory Visit: Payer: Self-pay

## 2020-01-05 MED ORDER — RIVAROXABAN 20 MG PO TABS: 20.0000 mg | ORAL_TABLET | Freq: Every day | ORAL | 1 refills | Status: DC

## 2020-01-06 DIAGNOSIS — N281 Cyst of kidney, acquired: Secondary | ICD-10-CM | POA: Diagnosis not present

## 2020-01-06 DIAGNOSIS — I10 Essential (primary) hypertension: Secondary | ICD-10-CM | POA: Diagnosis not present

## 2020-01-06 DIAGNOSIS — N400 Enlarged prostate without lower urinary tract symptoms: Secondary | ICD-10-CM | POA: Diagnosis not present

## 2020-01-06 DIAGNOSIS — I701 Atherosclerosis of renal artery: Secondary | ICD-10-CM | POA: Diagnosis not present

## 2020-01-13 ENCOUNTER — Telehealth: Payer: Self-pay | Admitting: Gastroenterology

## 2020-01-13 NOTE — Telephone Encounter (Signed)
Pt wife left vm stating Dr. Allen Norris started pt on rx dyciclomine and it is working very well

## 2020-01-30 ENCOUNTER — Other Ambulatory Visit: Payer: Self-pay | Admitting: Cardiovascular Disease

## 2020-01-31 ENCOUNTER — Other Ambulatory Visit: Payer: Self-pay | Admitting: Gastroenterology

## 2020-02-21 DIAGNOSIS — C44311 Basal cell carcinoma of skin of nose: Secondary | ICD-10-CM | POA: Diagnosis not present

## 2020-03-18 ENCOUNTER — Other Ambulatory Visit: Payer: Self-pay | Admitting: Vascular Surgery

## 2020-03-18 DIAGNOSIS — I739 Peripheral vascular disease, unspecified: Secondary | ICD-10-CM

## 2020-03-28 DIAGNOSIS — X32XXXA Exposure to sunlight, initial encounter: Secondary | ICD-10-CM | POA: Diagnosis not present

## 2020-03-28 DIAGNOSIS — L570 Actinic keratosis: Secondary | ICD-10-CM | POA: Diagnosis not present

## 2020-03-28 DIAGNOSIS — D485 Neoplasm of uncertain behavior of skin: Secondary | ICD-10-CM | POA: Diagnosis not present

## 2020-03-28 DIAGNOSIS — C44219 Basal cell carcinoma of skin of left ear and external auricular canal: Secondary | ICD-10-CM | POA: Diagnosis not present

## 2020-03-29 ENCOUNTER — Telehealth: Payer: Self-pay

## 2020-03-29 NOTE — Telephone Encounter (Signed)
Before I can acces if he can increase the dose or the frequency, I would need to know how much he is taking now.

## 2020-03-29 NOTE — Telephone Encounter (Signed)
Spoke with pt and he stated he is having stomach pains again. He was wanting to know if he could increase either the dosage or times he takes it daily. He also has been using the Hyscyamine if the Dicylocmine stops working. Please advise.

## 2020-03-29 NOTE — Telephone Encounter (Signed)
Patient is having very strong stomach issues. Would like a call from the nurse to see if he needs to up his medication or come in? Please advise

## 2020-03-30 ENCOUNTER — Other Ambulatory Visit: Payer: Self-pay | Admitting: Cardiovascular Disease

## 2020-03-30 ENCOUNTER — Other Ambulatory Visit: Payer: Self-pay

## 2020-03-30 MED ORDER — HYDROCHLOROTHIAZIDE 25 MG PO TABS: 25.0000 mg | ORAL_TABLET | Freq: Every day | ORAL | 0 refills | Status: DC

## 2020-03-30 MED ORDER — ATORVASTATIN CALCIUM 40 MG PO TABS: ORAL_TABLET | ORAL | 0 refills | Status: DC

## 2020-03-30 MED ORDER — HYOSCYAMINE SULFATE 0.125 MG SL SUBL: 0.1250 mg | SUBLINGUAL_TABLET | SUBLINGUAL | 3 refills | Status: DC | PRN

## 2020-03-30 NOTE — Telephone Encounter (Signed)
Currently taking Dicyclomine 20mg  TID. Adding 1 tablet of Hyoscyamine when he is having break through discomfort.

## 2020-03-30 NOTE — Telephone Encounter (Signed)
He can take the bentyl 4 times a day

## 2020-03-30 NOTE — Telephone Encounter (Signed)
Pt notified he can increase his Dicyclomine to 4 times daily. Pt has requested a refill on his Levsin. Rx sent.

## 2020-03-30 NOTE — Telephone Encounter (Signed)
Going forward all prescription refills must go to [facility] - no longer use [facility] mail order  *STAT* If patient is at the pharmacy, call can be transferred to refill team.   1. Which medications need to be refilled? (please list name of each medication and dose if known)  Atorvastatin 40 MG 1 tablet daily at 6pm Hydrochlorothiazide 25 MG 1 tablet daily   2. Which pharmacy/location (including street and city if local pharmacy) is medication to be sent to? [facility]   3. Do they need a 30 day or 90 day supply? 90 day  Going forward all prescription refills must go to [facility] - no longer use [facility] mail order

## 2020-03-30 NOTE — Telephone Encounter (Signed)
Requested Prescriptions   Signed Prescriptions Disp Refills  . atorvastatin (LIPITOR) 40 MG tablet 90 tablet 0    Sig: Take 1 tablet by mouth daily at 6pm    Authorizing Provider: Minna Merritts    Ordering User: Eugenio Hoes, MARINA C  . hydrochlorothiazide (HYDRODIURIL) 25 MG tablet 90 tablet 0    Sig: Take 1 tablet (25 mg total) by mouth daily.    Authorizing Provider: Minna Merritts    Ordering User: Britt Bottom

## 2020-04-05 DIAGNOSIS — E538 Deficiency of other specified B group vitamins: Secondary | ICD-10-CM | POA: Diagnosis not present

## 2020-04-05 DIAGNOSIS — R4189 Other symptoms and signs involving cognitive functions and awareness: Secondary | ICD-10-CM | POA: Diagnosis not present

## 2020-04-05 LAB — TSH: TSH: 3.42 (ref <=5.90)

## 2020-04-05 LAB — HEPATIC FUNCTION PANEL
ALT: 19 (ref 10–40)
AST: 21 (ref 14–40)
Alkaline Phosphatase: 90 (ref 25–125)
Bilirubin, Total: 0.5

## 2020-04-05 LAB — CBC AND DIFFERENTIAL
HCT: 34 — AB (ref 41–53)
Hemoglobin: 11.1 — AB (ref 13.5–17.5)
Platelets: 242 (ref 150–399)
WBC: 8.2

## 2020-04-05 LAB — BASIC METABOLIC PANEL WITH GFR
Potassium: 3.1 — AB (ref 3.4–5.3)
Sodium: 136 — AB (ref 137–147)

## 2020-04-05 LAB — BASIC METABOLIC PANEL
BUN: 23 — AB (ref 4–21)
CO2: 27 — AB (ref 13–22)
Chloride: 101 (ref 99–108)
Creatinine: 0.9 (ref <=1.3)
Glucose: 115

## 2020-04-05 LAB — COMPREHENSIVE METABOLIC PANEL
Albumin: 4 (ref 3.5–5.0)
Calcium: 9.3 (ref 8.7–10.7)

## 2020-04-05 LAB — CBC: RBC: 3.78 — AB (ref 3.87–5.11)

## 2020-04-07 DIAGNOSIS — L570 Actinic keratosis: Secondary | ICD-10-CM | POA: Diagnosis not present

## 2020-04-12 ENCOUNTER — Telehealth: Payer: Self-pay

## 2020-04-12 NOTE — Telephone Encounter (Signed)
LMTCB to inquire about setting up a telephonic AWV prior to CPE on 05/10/20. Direct # left to CB on. Also requested a CB from wife to set up her AWV too.

## 2020-04-17 NOTE — Progress Notes (Signed)
Subjective:   DEVONE BATT is a 79 y.o. male who presents for Medicare Annual/Subsequent preventive examination.    This visit is being conducted through telemedicine due to the COVID-19 pandemic. This patient has given me verbal consent via doximity to conduct this visit, patient states they are participating from their home address. Some vital signs may be absent or patient reported.    Patient identification: identified by name, DOB, and current address  Review of Systems:  N/A  Cardiac Risk Factors include: advanced age (>89men, >43 women);dyslipidemia;hypertension;male gender     Objective:    Vitals: There were no vitals taken for this visit.  There is no height or weight on file to calculate BMI. Unable to obtain vitals due to visit being conducted via telephonically.   Advanced Directives 04/18/2020 07/13/2019 12/07/2018 12/07/2018 08/11/2018 07/30/2018 03/13/2017  Does Patient Have a Medical Advance Directive? Yes Yes - No Yes Yes Yes  Type of Paramedic of [place];Living will Power;Living will - - [place];Living will Living will;Healthcare Power of Attorney -  Does patient want to make changes to medical advance directive? - - - - - - -  Copy of [place] in Chart? No - copy requested No - copy requested - - Yes - -  Would patient like information on creating a medical advance directive? - - No - Patient declined - - No - Patient declined -  Pre-existing out of facility DNR order (yellow form or pink MOST form) - - - - - - -    Tobacco Social History   Tobacco Use  Smoking Status Former Smoker  . Types: Cigarettes  . Quit date: 12/30/1990  . Years since quitting: 29.3  Smokeless Tobacco Former Systems developer  . Quit date: 01/10/1991  Tobacco Comment   quit smoking in 1992     Counseling given: Not Answered Comment: quit smoking in 1992   Clinical Intake:  Pre-visit preparation completed:  Yes  Pain : No/denies pain Pain Score: 0-No pain     Nutritional Risks: None Diabetes: No  How often do you need to have someone help you when you read instructions, pamphlets, or other written materials from your doctor or pharmacy?: 1 - Never  Interpreter Needed?: No  Information entered by :: [facility], LPN  Past Medical History:  Diagnosis Date  . Cancer ([place])    basel cell carcinoma left ear  . Carotid artery occlusion    s/p right carotid endarterectomy  . Chronic kidney disease    renal artery stenosis  . Coronary artery disease    s/p coronary bypass graft surgery in 1992, stable  . GERD (gastroesophageal reflux disease)   . Hyperlipidemia   . Hypertension   . Myocardial infarction ([place])   . PVD (peripheral vascular disease) ([place])    s/p multiple revascularization procedures.  . Shortness of breath   . Sleep apnea    Past Surgical History:  Procedure Laterality Date  . abdomial angiogram  03-16-2014  . ABDOMINAL ANGIOGRAM N/A 03/16/2014   Procedure: ABDOMINAL ANGIOGRAM;  Surgeon: Rosetta Posner, MD;  Location: [facility] CATH LAB;  Service: Cardiovascular;  Laterality: N/A;  . ABDOMINAL AORTAGRAM N/A 12/03/2011   Procedure: ABDOMINAL AORTAGRAM;  Surgeon: Serafina Mitchell, MD;  Location: [facility] CATH LAB;  Service: Cardiovascular;  Laterality: N/A;  . CAROTID ENDARTERECTOMY     Right. With 46% stenosis in the right by recent Doppler  . COLONOSCOPY WITH PROPOFOL N/A  03/12/2016   Procedure: COLONOSCOPY WITH PROPOFOL;  Surgeon: [name], MD;  Location: [facility] ENDOSCOPY;  Service: Endoscopy;  Laterality: N/A;  . CORONARY ARTERY BYPASS GRAFT  1992  . CYST REMOVAL HAND     right index knuckle  . ESOPHAGOGASTRODUODENOSCOPY (EGD) WITH PROPOFOL N/A 07/13/2019   Procedure: ESOPHAGOGASTRODUODENOSCOPY (EGD) WITH PROPOFOL;  Surgeon: [name], MD;  Location: [facility] ENDOSCOPY;  Service: Endoscopy;  Laterality: N/A;  . left common iliac  12/03/2011  . LEFT HEART CATH AND CORS/GRAFTS  ANGIOGRAPHY Left 12/08/2018   Procedure: LEFT HEART CATH AND CORS/GRAFTS ANGIOGRAPHY;  Surgeon: Nelva Bush, MD;  Location: [place] CV LAB;  Service: Cardiovascular;  Laterality: Left;  . MOHS SURGERY    . Peripheral vascular revascularization     . RENAL ANGIOGRAM  03-16-2014  . RENAL ANGIOGRAM Left 12/03/2011   Procedure: RENAL ANGIOGRAM;  Surgeon: Serafina Mitchell, MD;  Location[facility] C[facility] LAB;  Service: Cardiovascular;  Laterality: Left;  lt renal artery stent  . RENAL ANGIOGRAM N/A 03/16/2014   Procedure: RENAL ANGIOGRAM;  Surgeon: Rosetta Posner, MD;  Location: [facility] CATH LAB;  Service: Cardiovascular;  Laterality: N/A;  . renal artery stenosis  12/03/2011  . SHOULDER ARTHROSCOPY     Right Amnioplasty; Acromioclavicular joint resection and arthroscopic debridement  . Surgery for Sleep Apnea     Family History  Problem Relation Age of Onset  . Stroke Mother   . Heart disease Other   . Stroke Sister   . Stroke Brother    Social History   Socioeconomic History  . Marital status: Married    Spouse name: Not on file  . Number of children: 3  . Years of education: Not on file  . Highest education level: Some college, no degree  Occupational History  . Occupation: retired  Tobacco Use  . Smoking status: Former Smoker    Types: Cigarettes    Quit date: 12/30/1990    Years since quitting: 29.3  . Smokeless tobacco: Former Systems developer    Quit date: 01/10/1991  . Tobacco comment: quit smoking in 1992  Substance and Sexual Activity  . Alcohol use: Yes    Alcohol/week: 0.0 - 2.0 standard drinks    Comment: maybe 1-2 a month  . Drug use: No  . Sexual activity: Yes  Other Topics Concern  . Not on file  Social History Narrative  . Not on file   Social Determinants of Health   Financial Resource Strain: Low Risk   . Difficulty of Paying Living Expenses: Not hard at all  Food Insecurity: No Food Insecurity  . Worried About Charity fundraiser in the Last Year: Never true  . Ran Out  of Food in the Last Year: Never true  Transportation Needs: No Transportation Needs  . Lack of Transportation (Medical): No  . Lack of Transportation (Non-Medical): No  Physical Activity: Sufficiently Active  . Days of Exercise per Week: 6 days  . Minutes of Exercise per Session: 60 min  Stress: No Stress Concern Present  . Feeling of Stress : Not at all  Social Connections: Slightly Isolated  . Frequency of Communication with Friends and Family: More than three times a week  . Frequency of Social Gatherings with Friends and Family: Once a week  . Attends Religious Services: More than 4 times per year  . Active Member of Clubs or Organizations: No  . Attends Archivist Meetings: Never  . Marital Status: Married    Outpatient Encounter Medications  as of 04/18/2020  Medication Sig  . atorvastatin (LIPITOR) 40 MG tablet Take 1 tablet by mouth daily at 6pm  . clopidogrel (PLAVIX) 75 MG tablet TAKE ONE TABLET BY MOUTH EVERY DAY  . Coenzyme Q10 (CO Q-10) 100 MG CAPS Take 100 mg by mouth daily.  Marland Kitchen dicyclomine (BENTYL) 20 MG tablet TAKE ONE TABLET EVERY 6 HOURS (Patient taking differently: Taking 2 pills a day.)  . doxazosin (CARDURA) 8 MG tablet TAKE ONE TABLET BY MOUTH TWICE DAILY  . escitalopram (LEXAPRO) 5 MG tablet Take 5 mg by mouth daily.  Marland Kitchen ezetimibe (ZETIA) 10 MG tablet Take 1 tablet (10 mg total) by mouth daily.  . Garlic 123XX123 MG CAPS Take 7,000 mg by mouth daily.   . hydrochlorothiazide (HYDRODIURIL) 25 MG tablet Take 1 tablet (25 mg total) by mouth daily.  . hyoscyamine (LEVSIN SL) 0.125 MG SL tablet Place 1 tablet (0.125 mg total) under the tongue every 4 (four) hours as needed.  . lansoprazole (PREVACID) 15 MG capsule Take 1 capsule (15 mg total) by mouth daily at 12 noon.  Marland Kitchen levothyroxine (SYNTHROID) 25 MCG tablet Take 1 tablet (25 mcg total) by mouth daily before breakfast.  . metoprolol tartrate (LOPRESSOR) 25 MG tablet TAKE ONE-HALF TABLET BY MOUTH TWICE DAILY  .  nitroGLYCERIN (NITROLINGUAL) 0.4 MG/SPRAY spray Place 1 spray under the tongue every 5 (five) minutes x 3 doses as needed for chest pain.  . polyethylene glycol (MIRALAX / GLYCOLAX) packet Take 17 g by mouth daily.  . rivaroxaban (XARELTO) 20 MG TABS tablet Take 1 tablet (20 mg total) by mouth daily with supper.  . [place]to 450 MG CAPS Take 450 mg by mouth daily.  Marland Kitchen dexlansoprazole (DEXILANT) 60 MG capsule Take 1 capsule (60 mg total) by mouth daily. (Patient not taking: Reported on 10/05/2019)  . losartan (COZAAR) 25 MG tablet Take by mouth. As needed  . Melatonin 3 MG TABS Take 6 mg by mouth at bedtime.  . RABEprazole (ACIPHEX) 20 MG tablet Take 1 tablet (20 mg total) by mouth daily. (Patient not taking: Reported on 10/05/2019)   No facility-administered encounter medications on file as of 04/18/2020.    Activities of Daily Living In your present state of health, do you have any difficulty performing the following activities: 04/18/2020 05/11/2019  Hearing? N Y  Vision? N N  Difficulty concentrating or making decisions? Y N  Comment Currently seeing a Neurologist for issue. -  Walking or climbing stairs? N Y  Dressing or bathing? N N  Doing errands, shopping? N N  Preparing Food and eating ? N -  Using the Toilet? N -  In the past six months, have you accidently leaked urine? N -  Do you have problems with loss of bowel control? N -  Managing your Medications? N -  Managing your Finances? N -  Housekeeping or managing your Housekeeping? N -  Some recent data might be hidden    Patient Care Team: Paulene Floor as PCP - General (Physician Assistant) Minna Merritts, MD as PCP - Cardiology (Cardiology) Vladimir Crofts, MD as Consulting Physician (Neurology) [name], MD as Consulting Physician (Gastroenterology) Oneta Rack, MD (Dermatology) Early, Arvilla Meres, MD as Consulting Physician (Vascular Surgery) Murlean Iba, MD (Nephrology) Merril Abbe, MD as  Referring Physician (Dermatology)   Assessment:   This is a routine wellness examination for Yahshua.  Exercise Activities and Dietary recommendations Current Exercise Habits: Home exercise routine, Type of exercise:  treadmill;walking, Time (Minutes): 60, Frequency (Times/Week): 6, Weekly Exercise (Minutes/Week): 360, Intensity: Mild, Exercise limited by: None identified  Goals   None     Fall Risk: Fall Risk  04/18/2020 05/11/2019 04/14/2018 03/13/2017 01/29/2016  Falls in the past year? 0 0 Yes No No  Number falls in past yr: 0 - 2 or more - -  Injury with Fall? 0 - Yes - -  Follow up - - Follow up appointment - -    FALL RISK PREVENTION PERTAINING TO THE HOME:  Any stairs in or around the home? Yes  If so, are there any without handrails? No   Home free of loose throw rugs in walkways, pet beds, electrical cords, etc? Yes  Adequate lighting in your home to reduce risk of falls? Yes   ASSISTIVE DEVICES UTILIZED TO PREVENT FALLS:  Life alert? No  Use of a cane, walker or w/c? No  Grab bars in the bathroom? Yes  Shower chair or bench in shower? No  Elevated toilet seat or a handicapped toilet? Yes   TIMED UP AND GO:  Was the test performed? No .    Depression Screen PHQ 2/9 Scores 05/11/2019 04/14/2018 03/13/2017 01/29/2016  PHQ - 2 Score 0 0 0 0    Cognitive Function: Declined today.         Immunization History  Administered Date(s) Administered  . Influenza Whole 10/05/2012  . Influenza, High Dose Seasonal PF 09/01/2019  . Influenza,inj,Quad PF,6+ Mos 10/01/2017  . Influenza,inj,quad, With Preservative 10/28/2018  . Influenza-Unspecified 09/29/2013, 09/16/2014, 09/11/2015, 10/27/2018  . Pneumococcal Conjugate-13 11/21/2014  . Pneumococcal Polysaccharide-23 01/09/2012, 04/21/2018  . Td 01/29/2016  . Tdap 03/14/2006  . Zoster 03/26/2007  . Zoster Recombinat (Shingrix) 09/04/2018, 11/11/2018, 12/01/2018    Qualifies for Shingles Vaccine? Completed series  Tdap:  Up to date  Flu Vaccine: Up to date  Pneumococcal Vaccine: Completed series  Screening Tests Health Maintenance  Topic Date Due  . COVID-19 Vaccine (1) Never done  . INFLUENZA VACCINE  07/30/2020  . COLONOSCOPY  03/12/2021  . TETANUS/TDAP  01/28/2026  . PNA vac Low Risk Adult  Completed   Cancer Screenings:  Colorectal Screening: Completed 03/12/16. Repeat every 5 years.   Lung Cancer Screening: (Low Dose CT Chest recommended if Age 16-80 years, 30 pack-year currently smoking OR have quit w/in 15years.) does not qualify.   Additional Screening:  Vision Screening: Recommended annual ophthalmology exams for early detection of glaucoma and other disorders of the eye.  Dental Screening: Recommended annual dental exams for proper oral hygiene  Community Resource Referral:  CRR required this visit?  No        Plan:  I have personally reviewed and addressed the Medicare Annual Wellness questionnaire and have noted the following in the patient's chart:  A. Medical and social history B. Use of alcohol, tobacco or illicit drugs  C. Current medications and supplements D. Functional ability and status E.  Nutritional status F.  Physical activity G. Advance directives H. List of other physicians I.  Hospitalizations, surgeries, and ER visits in previous 12 months J.  [place] such as hearing and vision if needed, cognitive and depression L. Referrals and appointments   In addition, I have reviewed and discussed with patient certain preventive protocols, quality metrics, and best practice recommendations. A written personalized care plan for preventive services as well as general preventive health recommendations were provided to patient.   Glendora Score, [place]  [id number] Nurse Health  Advisor   Nurse Notes: None.

## 2020-04-17 NOTE — Telephone Encounter (Signed)
Scheduled telephonic AWV for 04/18/20 @ 10:20 AM.

## 2020-04-18 ENCOUNTER — Ambulatory Visit (INDEPENDENT_AMBULATORY_CARE_PROVIDER_SITE_OTHER): Payer: PPO

## 2020-04-18 ENCOUNTER — Other Ambulatory Visit: Payer: Self-pay

## 2020-04-18 DIAGNOSIS — Z0000 Encounter for general adult medical examination without abnormal findings: Secondary | ICD-10-CM

## 2020-04-18 NOTE — Patient Instructions (Signed)
Clifford Aguilar , Thank you for taking time to come for your Medicare Wellness Visit. I appreciate your ongoing commitment to your health goals. Please review the following plan we discussed and let me know if I can assist you in the future.   Screening recommendations/referrals: Colonoscopy: Up to date, due 02/2021 Recommended yearly ophthalmology/optometry visit for glaucoma screening and checkup Recommended yearly dental visit for hygiene and checkup  Vaccinations: Influenza vaccine: Up to date Pneumococcal vaccine: Completed series Tdap vaccine: Up to date Shingles vaccine: Completed series    Advanced directives: Please bring a copy of your POA (Power of Attorney) and/or Living Will to your next appointment.   Conditions/risks identified: None, declined any diet/exercise changes.   Next appointment: 05/10/20 @ 10:00 AM with Carles Collet. Declined scheduling an AWV for 2022 at this time.  Preventive Care 35 Years and Older, Male Preventive care refers to lifestyle choices and visits with your health care provider that can promote health and wellness. What does preventive care include?  A yearly physical exam. This is also called an annual well check.  Dental exams once or twice a year.  Routine eye exams. Ask your health care provider how often you should have your eyes checked.  Personal lifestyle choices, including:  Daily care of your teeth and gums.  Regular physical activity.  Eating a healthy diet.  Avoiding tobacco and drug use.  Limiting alcohol use.  Practicing safe sex.  Taking low doses of aspirin every day.  Taking vitamin and mineral supplements as recommended by your health care provider. What happens during an annual well check? The services and screenings done by your health care provider during your annual well check will depend on your age, overall health, lifestyle risk factors, and family history of disease. Counseling  Your health care provider  may ask you questions about your:  Alcohol use.  Tobacco use.  Drug use.  Emotional well-being.  Home and relationship well-being.  Sexual activity.  Eating habits.  History of falls.  Memory and ability to understand (cognition).  Work and work Statistician. Screening  You may have the following tests or measurements:  Height, weight, and BMI.  Blood pressure.  Lipid and cholesterol levels. These may be checked every 5 years, or more frequently if you are over 28 years old.  Skin check.  Lung cancer screening. You may have this screening every year starting at age 27 if you have a 30-pack-year history of smoking and currently smoke or have quit within the past 15 years.  Fecal occult blood test (FOBT) of the stool. You may have this test every year starting at age 35.  Flexible sigmoidoscopy or colonoscopy. You may have a sigmoidoscopy every 5 years or a colonoscopy every 10 years starting at age 56.  Prostate cancer screening. Recommendations will vary depending on your family history and other risks.  Hepatitis C blood test.  Hepatitis B blood test.  Sexually transmitted disease (STD) testing.  Diabetes screening. This is done by checking your blood sugar (glucose) after you have not eaten for a while (fasting). You may have this done every 1-3 years.  Abdominal aortic aneurysm (AAA) screening. You may need this if you are a current or former smoker.  Osteoporosis. You may be screened starting at age 96 if you are at high risk. Talk with your health care provider about your test results, treatment options, and if necessary, the need for more tests. Vaccines  Your health care provider may recommend certain  vaccines, such as:  Influenza vaccine. This is recommended every year.  Tetanus, diphtheria, and acellular pertussis (Tdap, Td) vaccine. You may need a Td booster every 10 years.  Zoster vaccine. You may need this after age 72.  Pneumococcal 13-valent  conjugate (PCV13) vaccine. One dose is recommended after age 58.  Pneumococcal polysaccharide (PPSV23) vaccine. One dose is recommended after age 74. Talk to your health care provider about which screenings and vaccines you need and how often you need them. This information is not intended to replace advice given to you by your health care provider. Make sure you discuss any questions you have with your health care provider. Document Released: 01/12/2016 Document Revised: 09/04/2016 Document Reviewed: 10/17/2015 Elsevier Interactive Patient Education  2017 [place] Prevention in the Home Falls can cause injuries. They can happen to people of all ages. There are many things you can do to make your home safe and to help prevent falls. What can I do on the outside of my home?  Regularly fix the edges of walkways and driveways and fix any cracks.  Remove anything that might make you trip as you walk through a door, such as a raised step or threshold.  Trim any bushes or trees on the path to your home.  Use bright outdoor lighting.  Clear any walking paths of anything that might make someone trip, such as rocks or tools.  Regularly check to see if handrails are loose or broken. Make sure that both sides of any steps have handrails.  Any raised decks and porches should have guardrails on the edges.  Have any leaves, snow, or ice cleared regularly.  Use sand or salt on walking paths during winter.  Clean up any spills in your garage right away. This includes oil or grease spills. What can I do in the bathroom?  Use night lights.  Install grab bars by the toilet and in the tub and shower. Do not use towel bars as grab bars.  Use non-skid mats or decals in the tub or shower.  If you need to sit down in the shower, use a plastic, non-slip stool.  Keep the floor dry. Clean up any water that spills on the floor as soon as it happens.  Remove soap buildup in the tub or  shower regularly.  Attach bath mats securely with double-sided non-slip rug tape.  Do not have throw rugs and other things on the floor that can make you trip. What can I do in the bedroom?  Use night lights.  Make sure that you have a light by your bed that is easy to reach.  Do not use any sheets or blankets that are too big for your bed. They should not hang down onto the floor.  Have a firm chair that has side arms. You can use this for support while you get dressed.  Do not have throw rugs and other things on the floor that can make you trip. What can I do in the kitchen?  Clean up any spills right away.  Avoid walking on wet floors.  Keep items that you use a lot in easy-to-reach places.  If you need to reach something above you, use a strong step stool that has a grab bar.  Keep electrical cords out of the way.  Do not use floor polish or wax that makes floors slippery. If you must use wax, use non-skid floor wax.  Do not have throw rugs and other things  on the floor that can make you trip. What can I do with my stairs?  Do not leave any items on the stairs.  Make sure that there are handrails on both sides of the stairs and use them. Fix handrails that are broken or loose. Make sure that handrails are as long as the stairways.  Check any carpeting to make sure that it is firmly attached to the stairs. Fix any carpet that is loose or worn.  Avoid having throw rugs at the top or bottom of the stairs. If you do have throw rugs, attach them to the floor with carpet tape.  Make sure that you have a light switch at the top of the stairs and the bottom of the stairs. If you do not have them, ask someone to add them for you. What else can I do to help prevent falls?  Wear shoes that:  Do not have high heels.  Have rubber bottoms.  Are comfortable and fit you well.  Are closed at the toe. Do not wear sandals.  If you use a stepladder:  Make sure that it is fully  opened. Do not climb a closed stepladder.  Make sure that both sides of the stepladder are locked into place.  Ask someone to hold it for you, if possible.  Clearly mark and make sure that you can see:  Any grab bars or handrails.  First and last steps.  Where the edge of each step is.  Use tools that help you move around (mobility aids) if they are needed. These include:  Canes.  Walkers.  Scooters.  Crutches.  Turn on the lights when you go into a dark area. Replace any light bulbs as soon as they burn out.  Set up your furniture so you have a clear path. Avoid moving your furniture around.  If any of your floors are uneven, fix them.  If there are any pets around you, be aware of where they are.  Review your medicines with your doctor. Some medicines can make you feel dizzy. This can increase your chance of falling. Ask your doctor what other things that you can do to help prevent falls. This information is not intended to replace advice given to you by your health care provider. Make sure you discuss any questions you have with your health care provider. Document Released: 10/12/2009 Document Revised: 05/23/2016 Document Reviewed: 01/20/2015 Elsevier Interactive Patient Education  2017 [employer].

## 2020-04-26 ENCOUNTER — Other Ambulatory Visit: Payer: Self-pay | Admitting: Physician Assistant

## 2020-04-26 DIAGNOSIS — E039 Hypothyroidism, unspecified: Secondary | ICD-10-CM

## 2020-04-26 MED ORDER — LEVOTHYROXINE SODIUM 25 MCG PO TABS: 25.0000 ug | ORAL_TABLET | Freq: Every day | ORAL | 1 refills | Status: DC

## 2020-04-26 NOTE — Telephone Encounter (Signed)
Medication Refill - Medication: levothyroxine (SYNTHROID) 25 MCG tablet    Has the patient contacted their pharmacy? Yes.   (Agent: If no, request that the patient contact the pharmacy for the refill.) (Agent: If yes, when and what did the pharmacy advise?)  Preferred Pharmacy (with phone number or street name):  [facility], [place] - [address]  [place] [address]  Phone: [phone] Fax: [phone]     Agent: Please be advised that RX refills may take up to 3 business days. We ask that you follow-up with your pharmacy.

## 2020-05-01 DIAGNOSIS — C44219 Basal cell carcinoma of skin of left ear and external auricular canal: Secondary | ICD-10-CM | POA: Diagnosis not present

## 2020-05-01 DIAGNOSIS — H61112 Acquired deformity of pinna, left ear: Secondary | ICD-10-CM | POA: Diagnosis not present

## 2020-05-01 DIAGNOSIS — L578 Other skin changes due to chronic exposure to nonionizing radiation: Secondary | ICD-10-CM | POA: Diagnosis not present

## 2020-05-08 ENCOUNTER — Other Ambulatory Visit: Payer: Self-pay | Admitting: Cardiovascular Disease

## 2020-05-08 NOTE — Telephone Encounter (Signed)
Refill Request.  

## 2020-05-08 NOTE — Telephone Encounter (Signed)
Pt's age 79, wt 82.4 kg, SCr 0.88, CrCl 83.23, last ov w/ TG 07/26/19.

## 2020-05-10 ENCOUNTER — Ambulatory Visit (INDEPENDENT_AMBULATORY_CARE_PROVIDER_SITE_OTHER): Payer: PPO | Admitting: Physician Assistant

## 2020-05-10 ENCOUNTER — Other Ambulatory Visit: Payer: Self-pay

## 2020-05-10 ENCOUNTER — Encounter: Payer: Self-pay | Admitting: Physician Assistant

## 2020-05-10 VITALS — BP 118/68 | HR 80 | Temp 96.9°F | Ht 69.0 in | Wt 177.8 lb

## 2020-05-10 DIAGNOSIS — E039 Hypothyroidism, unspecified: Secondary | ICD-10-CM | POA: Diagnosis not present

## 2020-05-10 DIAGNOSIS — I10 Essential (primary) hypertension: Secondary | ICD-10-CM

## 2020-05-10 DIAGNOSIS — Z125 Encounter for screening for malignant neoplasm of prostate: Secondary | ICD-10-CM

## 2020-05-10 DIAGNOSIS — Z0000 Encounter for general adult medical examination without abnormal findings: Secondary | ICD-10-CM | POA: Diagnosis not present

## 2020-05-10 NOTE — Progress Notes (Signed)
Annual Exam Visit     Patient: Clifford Aguilar, Male    DOB: Nov 15, 1941, 79 y.o.   MRN: [id number] Visit Date: 05/10/2020  Today's Provider: Trinna Post, PA-C   Chief Complaint  Patient presents with  . Annual Exam  . Hypertension  . Hypothyroidism  I,Porsha C McClurkin,acting as a scribe for [employer], PA-C.,have documented all relevant documentation on the behalf of Trinna Post, PA-C,as directed by  Trinna Post, PA-C while in the presence of Trinna Post, PA-C.  Subjective    Clifford Aguilar is a 79 y.o. male who presents today for his Annual Exam Visit. He reports consuming a low fat and low sodium diet. Home exercise routine includes treadmill and walking 1 hrs per day. He generally feels well. He reports sleeping poorly. He does have additional problems to discuss today.  Patient has not used CPAP in a few months and Dr.Shah would like patient to started back using CPAP machine to help with sleep. Stopped smoking in 1992. HPI AWE was w/ McKenzie on 04/18/2020  Hypertension, follow-up  BP Readings from Last 3 Encounters:  05/10/20 118/68  12/28/19 (!) 150/83  12/06/19 138/78   Wt Readings from Last 3 Encounters:  05/10/20 177 lb 12.8 oz (80.6 kg)  12/28/19 181 lb 9.6 oz (82.4 kg)  12/06/19 182 lb (82.6 kg)     He was last seen for hypertension 5 months ago.  BP at that visit was 138/78. Management since that visit includes continue current medication.  He reports good compliance with treatment. He is not having side effects.  He is following a Low fat, Low Sodium diet. He is exercising. He does not smoke.  Use of agents associated with hypertension: none.   Outside blood pressures are 120's-140's/70's. Symptoms: No chest pain No chest pressure  No palpitations No syncope  No dyspnea No orthopnea  No paroxysmal nocturnal dyspnea No lower extremity edema   Pertinent labs: Lab Results  Component Value Date   CHOL 132 05/11/2019   HDL  37 (L) 05/11/2019   LDLCALC 72 05/11/2019   TRIG 115 05/11/2019   CHOLHDL 3.6 05/11/2019   Lab Results  Component Value Date   NA 134 12/06/2019   K 4.2 12/06/2019   CREATININE 0.88 12/06/2019   GFRNONAA 83 12/06/2019   GFRAA 96 12/06/2019   GLUCOSE 119 (H) 12/06/2019     The ASCVD Risk score (Goff DC Jr., et al., 2013) failed to calculate for the following reasons:   The patient has a prior MI or stroke diagnosis   --------------------------------------------------------------------------------------------------- Hypothyroid, follow-up  Lab Results  Component Value Date   TSH 3.950 05/11/2019   TSH 5.290 (H) 04/15/2018   TSH 6.220 (H) 03/20/2017   FREET4 1.07 04/15/2018   FREET4 1.24 03/20/2017   Wt Readings from Last 3 Encounters:  05/10/20 177 lb 12.8 oz (80.6 kg)  12/28/19 181 lb 9.6 oz (82.4 kg)  12/06/19 182 lb (82.6 kg)    He was last seen for hypothyroid 5 months ago.  Management since that visit includes continue current medication. He reports good compliance with treatment. He is not having side effects.   Symptoms: No change in energy level No constipation No diarrhea No heat / cold intolerance No nervousness No palpitations No weight changes  -----------------------------------------------------------------------------------------       Medications: Outpatient Medications Prior to Visit  Medication Sig  . atorvastatin (LIPITOR) 40 MG tablet Take 1 tablet by mouth daily  at 6pm  . clopidogrel (PLAVIX) 75 MG tablet TAKE ONE TABLET BY MOUTH EVERY DAY  . Coenzyme Q10 (CO Q-10) 100 MG CAPS Take 100 mg by mouth daily.  Marland Kitchen dicyclomine (BENTYL) 20 MG tablet TAKE ONE TABLET EVERY 6 HOURS (Patient taking differently: Taking 2 pills a day.)  . doxazosin (CARDURA) 8 MG tablet TAKE ONE TABLET BY MOUTH TWICE DAILY  . ezetimibe (ZETIA) 10 MG tablet Take 1 tablet (10 mg total) by mouth daily.  . Garlic 123XX123 MG CAPS Take 7,000 mg by mouth daily.   .  hydrochlorothiazide (HYDRODIURIL) 25 MG tablet Take 1 tablet (25 mg total) by mouth daily.  . lansoprazole (PREVACID) 15 MG capsule Take 1 capsule (15 mg total) by mouth daily at 12 noon.  Marland Kitchen levothyroxine (SYNTHROID) 25 MCG tablet Take 1 tablet (25 mcg total) by mouth daily before breakfast.  . metoprolol tartrate (LOPRESSOR) 25 MG tablet TAKE ONE-HALF TABLET BY MOUTH TWICE DAILY  . nitroGLYCERIN (NITROLINGUAL) 0.4 MG/SPRAY spray Place 1 spray under the tongue every 5 (five) minutes x 3 doses as needed for chest pain.  . polyethylene glycol (MIRALAX / GLYCOLAX) packet Take 17 g by mouth daily.  . Saw Palmetto 450 MG CAPS Take 450 mg by mouth daily.  Alveda Reasons 20 MG TABS tablet TAKE 1 TABLET BY MOUTH DAILY WITH SUPPER  . dexlansoprazole (DEXILANT) 60 MG capsule Take 1 capsule (60 mg total) by mouth daily. (Patient not taking: Reported on 10/05/2019)  . escitalopram (LEXAPRO) 5 MG tablet Take 5 mg by mouth daily.  . hyoscyamine (LEVSIN SL) 0.125 MG SL tablet Place 1 tablet (0.125 mg total) under the tongue every 4 (four) hours as needed.  Marland Kitchen losartan (COZAAR) 25 MG tablet Take by mouth. As needed  . Melatonin 3 MG TABS Take 6 mg by mouth at bedtime.  . RABEprazole (ACIPHEX) 20 MG tablet Take 1 tablet (20 mg total) by mouth daily. (Patient not taking: Reported on 10/05/2019)   No facility-administered medications prior to visit.    Allergies  Allergen Reactions  . Hydralazine Rash  . Clonidine Derivatives Other (See Comments)    Caused blindness in left eye for the days on the drug  . Adhesive [Tape]     SKIN SENSITIVITY  . Isosorbide     Other reaction(s): Other (See Comments)  . Latex Other (See Comments)  . Trazodone Other (See Comments)    Cognitive impairment   . Benazepril Other (See Comments)    Nausea, vomiting and diarrhea  . Carvedilol Other (See Comments)    Blurred vision  . Hydrazine Yellow [Tartrazine] Itching and Rash  . Isosorbide Dinitrate Rash  . Isosorbide Mononitrate  Rash  . Lisinopril Other (See Comments)    Blurred vision   . Losartan Other (See Comments)    Blurry vision if doses>25mg . Only use it for his BP is >170    Patient Care Team: Paulene Floor as PCP - General (Physician Assistant) Minna Merritts, MD as PCP - Cardiology (Cardiology) Vladimir Crofts, MD as Consulting Physician (Neurology) [name], MD as Consulting Physician (Gastroenterology) Oneta Rack, MD (Dermatology) Early, Arvilla Meres, MD as Consulting Physician (Vascular Surgery) Murlean Iba, MD (Nephrology) Merril Abbe, MD as Referring Physician (Dermatology)  Review of Systems  Constitutional: Positive for activity change and fatigue.  Eyes: Negative.   Respiratory: Negative.   Cardiovascular: Negative.   Gastrointestinal: Positive for abdominal pain.  Endocrine: Negative.   Genitourinary: Negative.   Musculoskeletal: Negative.  Skin: Negative.   Allergic/Immunologic: Negative.   Neurological: Negative.   Hematological: Negative.   Psychiatric/Behavioral: Positive for confusion.      Objective    Vitals: BP 118/68 (BP Location: Left Arm, Patient Position: Sitting, Cuff Size: Normal)   Pulse 80   Temp (!) 96.9 F (36.1 C) (Temporal)   Ht 5\' 9"  (1.753 m)   Wt 177 lb 12.8 oz (80.6 kg)   SpO2 96%   BMI 26.26 kg/m    Physical Exam Constitutional:      Appearance: Normal appearance.  HENT:     Right Ear: Tympanic membrane, ear canal and external ear normal.     Left Ear: Tympanic membrane, ear canal and external ear normal.  Cardiovascular:     Rate and Rhythm: Normal rate and regular rhythm.     Pulses: Normal pulses.     Heart sounds: Normal heart sounds.  Pulmonary:     Effort: Pulmonary effort is normal.     Breath sounds: Normal breath sounds.  Abdominal:     General: Abdomen is flat. Bowel sounds are normal.     Palpations: Abdomen is soft.  Skin:    General: Skin is warm and dry.  Neurological:     General: No  focal deficit present.     Mental Status: He is alert and oriented to person, place, and time.  Psychiatric:        Mood and Affect: Mood normal.        Behavior: Behavior normal.      Most recent functional status assessment: In your present state of health, do you have any difficulty performing the following activities: 04/18/2020  Hearing? N  Vision? N  Difficulty concentrating or making decisions? Y  Comment Currently seeing a Neurologist for issue.  Walking or climbing stairs? N  Dressing or bathing? N  Doing errands, shopping? N  Preparing Food and eating ? N  Using the Toilet? N  In the past six months, have you accidently leaked urine? N  Do you have problems with loss of bowel control? N  Managing your Medications? N  Managing your Finances? N  Housekeeping or managing your Housekeeping? N  Some recent data might be hidden    Most recent fall risk assessment: Fall Risk  04/18/2020  Falls in the past year? 0  Number falls in past yr: 0  Injury with Fall? 0  Follow up -     Most recent depression screenings: PHQ 2/9 Scores 05/11/2019 04/14/2018  PHQ - 2 Score 0 0    Most recent cognitive screening: No flowsheet data found.  No results found for any visits on 05/10/20.  Assessment & Plan    1. Annual physical exam   2. Essential hypertension Patient's blood pressure is monitor by Dr. Rockey Situ.  3. Hypothyroidism, unspecified type Previously well controlled Continue Synthroid at current dose  TSH was checked on 04/05/2020 and was  3.423  4. Screening for prostate cancer  - PSA  Annual wellness visit done today including the all of the following: Reviewed patient's Family Medical History Reviewed and updated list of patient's medical providers Assessment of cognitive impairment was done Assessed patient's functional ability Established a written schedule for health screening [place] Completed and Reviewed  Exercise Activities  and Dietary recommendations Goals   None     Immunization History  Administered Date(s) Administered  . Influenza Whole 10/05/2012  . Influenza, High Dose Seasonal PF 09/01/2019  . Influenza,inj,Quad PF,6+ Mos  10/01/2017  . Influenza,inj,quad, With Preservative 10/28/2018  . Influenza-Unspecified 09/29/2013, 09/16/2014, 09/11/2015, 10/27/2018  . Pneumococcal Conjugate-13 11/21/2014  . Pneumococcal Polysaccharide-23 01/09/2012, 04/21/2018  . Td 01/29/2016  . Tdap 03/14/2006  . Zoster 03/26/2007  . Zoster Recombinat (Shingrix) 09/04/2018, 11/11/2018, 12/01/2018    Health Maintenance  Topic Date Due  . COVID-19 Vaccine (1) Never done  . INFLUENZA VACCINE  07/30/2020  . COLONOSCOPY  03/12/2021  . TETANUS/TDAP  01/28/2026  . PNA vac Low Risk Adult  Completed     Discussed health benefits of physical activity, and encouraged him to engage in regular exercise appropriate for his age and condition.      Return in about 1 year (around 05/10/2021) for CPE.     ITrinna Post, PA-C, have reviewed all documentation for this visit. The documentation on 05/10/20 for the exam, diagnosis, procedures, and orders are all accurate and complete.    Paulene Floor  [facility] [phone] (phone) [phone] (fax)  [place]

## 2020-05-11 LAB — PSA: Prostate Specific Ag, Serum: 0.5 ng/mL (ref 0.0–4.0)

## 2020-05-12 ENCOUNTER — Other Ambulatory Visit: Payer: Self-pay | Admitting: Cardiovascular Disease

## 2020-05-12 MED ORDER — RIVAROXABAN 20 MG PO TABS: ORAL_TABLET | ORAL | 1 refills | Status: DC

## 2020-05-12 NOTE — Telephone Encounter (Signed)
Please review for refill, Thanks !  

## 2020-05-12 NOTE — Telephone Encounter (Addendum)
Pt last saw Dr Rockey Situ 07/26/19, last labs 04/05/20 Creat 0.9, age 79, weight 80.6kg, CrCl 79.6, based on CrCl pt is on appropriate dosage of Xarelto 20mg  QD.  Will refill rx.

## 2020-05-12 NOTE — Telephone Encounter (Signed)
Please res[facility] to [facility] for 90 day refill

## 2020-06-23 ENCOUNTER — Other Ambulatory Visit: Payer: Self-pay

## 2020-06-23 ENCOUNTER — Ambulatory Visit: Payer: PPO | Admitting: Podiatry

## 2020-06-23 ENCOUNTER — Encounter: Payer: Self-pay | Admitting: Podiatry

## 2020-06-23 DIAGNOSIS — L989 Disorder of the skin and subcutaneous tissue, unspecified: Secondary | ICD-10-CM | POA: Diagnosis not present

## 2020-06-23 NOTE — Progress Notes (Signed)
   HPI: Patient presents today for follow-up treatment evaluation regarding right foot pain.  Patient was last seen on 03/29/2019 at which time he complained of symptomatic callus lesions which were trimmed and he says that it feels much better.  Patient has recently experienced a return of the pain to the bilateral foot.   Past Medical History:  Diagnosis Date  . Cancer ([place])    basel cell carcinoma left ear  . Carotid artery occlusion    s/p right carotid endarterectomy  . Chronic kidney disease    renal artery stenosis  . Coronary artery disease    s/p coronary bypass graft surgery in 1992, stable  . GERD (gastroesophageal reflux disease)   . Hyperlipidemia   . Hypertension   . Myocardial infarction ([place])   . PVD (peripheral vascular disease) ([place])    s/p multiple revascularization procedures.  . Shortness of breath   . Sleep apnea      Physical Exam: General: The patient is alert and oriented x3 in no acute distress.  Dermatology: Skin is warm, dry and supple bilateral lower extremities. Negative for open lesions or macerations.  Hyperkeratotic callus tissue with a central nucleated core noted to the sub-fifth MPJ bilateral foot.   Vascular: Palpable pedal pulses bilaterally. No edema or erythema noted. Capillary refill within normal limits.  Neurological: Epicritic and protective threshold grossly intact bilaterally.   Musculoskeletal Exam: Range of motion within normal limits to all pedal and ankle joints bilateral. Muscle strength 5/5 in all groups bilateral.  There is a significant amount of pain on palpation and range of motion also to the fifth MTPJ.  Assessment: 1.  Porokeratosis sub-fifth MPJ bilateral foot   Plan of Care:  1. Patient evaluated.  2.  Excisional debridement of the hyperkeratotic callus tissue was performed using chisel blade without incident or bleeding.  Salicylic acid and a Band-Aid was applied 3.  Return to clinic as needed      Edrick Kins, DPM [facility]  Dr. Edrick Kins, DPM    [address], [place] [address]                Office [phone]  Fax [phone]

## 2020-06-26 ENCOUNTER — Other Ambulatory Visit: Payer: Self-pay | Admitting: Cardiovascular Disease

## 2020-06-26 ENCOUNTER — Telehealth: Payer: Self-pay | Admitting: Cardiovascular Disease

## 2020-06-26 NOTE — Telephone Encounter (Signed)
Spoke with patient and she reports that he is having pain in the back right shoulder blade and he had some vomiting yesterday. Inquired if he had shortness of breath, left sided chest pain, jaw pain, or left arm pain and she denied. She states that it only hurts with deep breaths and no other symptoms. Reviewed symptoms that would be concerning for cardiac issues but that if his symptoms persist they may want to call his primary care provider. She verbalized understanding of our conversation, agreement with plan, and had no further questions at this time.

## 2020-06-26 NOTE — Telephone Encounter (Signed)
Patient wife is calling you back

## 2020-06-26 NOTE — Telephone Encounter (Signed)
Pt c/o of Chest Pain: STAT if CP now or developed within 24 hours  1. Are you having CP right now? no  2. Are you experiencing any other symptoms (ex. SOB, nausea, vomiting, sweating)? Vomiting last night  3. How long have you been experiencing CP? For a couple days  4. Is your CP continuous or coming and going? Comes and goes. Only when he takes a deep breath  5. Have you taken Nitroglycerin?   Wife states patient has hurting behind right shoulder blade.

## 2020-06-26 NOTE — Telephone Encounter (Signed)
Left voicemail message to call back  

## 2020-07-21 ENCOUNTER — Other Ambulatory Visit: Payer: Self-pay | Admitting: Cardiovascular Disease

## 2020-07-25 NOTE — Progress Notes (Signed)
Cardiology Office Note  Date:  07/26/2020   ID:  RUSHIL KIMBRELL, DOB 24-Mar-1941, MRN [id number]  PCP:  Trinna Post, PA-C   Chief Complaint  Patient presents with  . office visit    12 month F/U; Meds verbally reviewed with patient.    HPI:  Mr. Clifford Aguilar is a 79 years old with past medical history of PAF on anticoagulation CAD,  peripheral vascular disease,  CABG bypass surgery in 1992,  carotid endarterectomy on the right with residual 60-70% disease on last ultrasound in 2013 , chronically occluded left carotid artery,  multiple surgical procedures on his lower extremity by Dr. Donnetta Hutching,  left iliac stent and left renal angioplasty with Dr. 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% Atrial fib 11/2018 who presents for routine followup of his CAD, s/p CABG, atrial fib  In follow-up today reports he is doing relatively well, has not been in the hospital Does treadmill daily, gait unsteady No chest pain, denies significant shortness of breath with exertion Tries to stay active  Prior history Nonsmoker, quit 1992,  Labs reviewed with him on today's visit Total chol 132, LDL 72 HBA1C 5.07 Dec 2019  Prior issues with chronic diarrhea and vomiting, this has improved Previously seen by  Dr. Allen Norris Diagnosed with GERD  No tachycardia or palpitations concerning for atrial fibrillation  EKG personally reviewed by myself on todays visit Shows normal sinus rhythm rate 60 bpm no significant ST-T wave changes Pacs, no change   Other past medical history reviewed prior episode of paroxysmal atrial fibrillation Went to dentist, then out to lunch Did not feel well, took NTG Lay down in the bed Woke up with chest discomfort, did not feel well Went to the ER, noted to be in atrial fib with RVR, December 07, 2018  Admitted to the hospital, that  afternoon went back into normal sinus rhythm Elevated troponin due to demand ischemia:  Troponin 4.16. Catheterization performed 1. Severe native CAD, including diffusely disease proximal LAD up to 80% and CTO's of ostial LCx and ostial RCA. 2. Patent LIMA->LAD, RIMI->RCA, and SVG->diag. Patient reportedly had CABG x 4; question if jump portion of SVG to an OM is occluded  Aspirin has been discontinued and started on Xarelto for atrial fibrillation.   Since discharge denies any tachycardia or palpitations concerning for recurrent atrial fibrillation  Carotid 10/16 40 to 59% on the right Followed in [facility]  3/15 he was seen by Dr. Donnetta Hutching Found to have renal artery stenosis that had progressed,  3/18 he had catheterization for renal artery stent placement    PMH:   has a past medical history of Cancer [facility]), Carotid artery occlusion, Chronic kidney disease, Coronary artery disease, GERD (gastroesophageal reflux disease), Hyperlipidemia, Hypertension, Myocardial infarction [facility]), PVD (peripheral vascular disease) ([place]), Shortness of breath, and Sleep apnea.  PSH:    Past Surgical History:  Procedure Laterality Date  . abdomial angiogram  03-16-2014  . ABDOMINAL ANGIOGRAM N/A 03/16/2014   Procedure: ABDOMINAL ANGIOGRAM;  Surgeon: Rosetta Posner, MD;  Location: [facility] CATH LAB;  Service: Cardiovascular;  Laterality: N/A;  . ABDOMINAL AORTAGRAM N/A 12/03/2011   Procedure: ABDOMINAL AORTAGRAM;  Surgeon: Serafina Mitchell, MD;  Location: [facility] CATH LAB;  Service: Cardiovascular;  Laterality: N/A;  . CAROTID ENDARTERECTOMY     Right. With 46% stenosis in the right by recent Doppler  . COLONOSCOPY WITH  PROPOFOL N/A 03/12/2016   Procedure: COLONOSCOPY WITH PROPOFOL;  Surgeon: [name], MD;  Location: [facility] ENDOSCOPY;  Service: Endoscopy;  Laterality: N/A;  . CORONARY ARTERY BYPASS GRAFT  1992  . CYST REMOVAL HAND     right index knuckle  . ESOPHAGOGASTRODUODENOSCOPY (EGD) WITH PROPOFOL N/A  07/13/2019   Procedure: ESOPHAGOGASTRODUODENOSCOPY (EGD) WITH PROPOFOL;  Surgeon: [name], MD;  Location: [facility] ENDOSCOPY;  Service: Endoscopy;  Laterality: N/A;  . left common iliac  12/03/2011  . LEFT HEART CATH AND CORS/GRAFTS ANGIOGRAPHY Left 12/08/2018   Procedure: LEFT HEART CATH AND CORS/GRAFTS ANGIOGRAPHY;  Surgeon: Nelva Bush, MD;  Location: [facility] CV LAB;  Service: Cardiovascular;  Laterality: Left;  . MOHS SURGERY    . Peripheral vascular revascularization     . RENAL ANGIOGRAM  03-16-2014  . RENAL ANGIOGRAM Left 12/03/2011   Procedure: RENAL ANGIOGRAM;  Surgeon: Serafina Mitchell, MD;  Location: [facility] CATH LAB;  Service: Cardiovascular;  Laterality: Left;  lt renal artery stent  . RENAL ANGIOGRAM N/A 03/16/2014   Procedure: RENAL ANGIOGRAM;  Surgeon: Rosetta Posner, MD;  Location: [facility] CATH LAB;  Service: Cardiovascular;  Laterality: N/A;  . renal artery stenosis  12/03/2011  . SHOULDER ARTHROSCOPY     Right Amnioplasty; Acromioclavicular joint resection and arthroscopic debridement  . Surgery for Sleep Apnea      Current Outpatient Medications  Medication Sig Dispense Refill  . ARIPiprazole (ABILIFY) 2 MG tablet Take 2 mg by mouth daily.    Marland Kitchen atorvastatin (LIPITOR) 40 MG tablet TAKE 1 TABLET BY MOUTH DAILY AT 6PM 90 tablet 3  . clopidogrel (PLAVIX) 75 MG tablet TAKE ONE TABLET BY MOUTH EVERY DAY 90 tablet 3  . Coenzyme Q10 (CO Q-10) 100 MG CAPS Take 100 mg by mouth daily.    Marland Kitchen dicyclomine (BENTYL) 20 MG tablet 1 to 2 tablets daily    . doxazosin (CARDURA) 8 MG tablet TAKE ONE TABLET TWICE DAILY 180 tablet 3  . ezetimibe (ZETIA) 10 MG tablet Take 1 tablet (10 mg total) by mouth daily. 90 tablet 3  . Garlic 3151 MG CAPS Take 7,000 mg by mouth daily.     . hydrochlorothiazide (HYDRODIURIL) 25 MG tablet Take 1 tablet (25 mg total) by mouth daily as needed (As needed for ankle swelling or high blood pressure). 90 tablet 2  . hyoscyamine (LEVSIN SL) 0.125 MG SL tablet Place 1  tablet (0.125 mg total) under the tongue every 4 (four) hours as needed. 60 tablet 3  . lansoprazole (PREVACID) 15 MG capsule Take 1 capsule (15 mg total) by mouth daily at 12 noon. 30 capsule 6  . levothyroxine (SYNTHROID) 25 MCG tablet Take 1 tablet (25 mcg total) by mouth daily before breakfast. 90 tablet 1  . losartan (COZAAR) 25 MG tablet Take 1 tablet (25 mg total) by mouth daily. As needed 90 tablet 3  . metoprolol tartrate (LOPRESSOR) 25 MG tablet Take 0.5 tablets (12.5 mg total) by mouth 2 (two) times daily. 90 tablet 3  . nitroGLYCERIN (NITROLINGUAL) 0.4 MG/SPRAY spray Place 1 spray under the tongue every 5 (five) minutes x 3 doses as needed for chest pain. 12 g 3  . polyethylene glycol (MIRALAX / GLYCOLAX) packet Take 17 g by mouth daily. 90 packet 3  . rivaroxaban (XARELTO) 20 MG TABS tablet TAKE 1 TABLET BY MOUTH DAILY WITH SUPPER 90 tablet 1  . Saw Palmetto 450 MG CAPS Take 450 mg by mouth daily.    Marland Kitchen dicyclomine (BENTYL) 20  MG tablet TAKE ONE TABLET EVERY 6 HOURS (Patient taking differently: Taking 2 pills a day.) 120 tablet 3  . escitalopram (LEXAPRO) 5 MG tablet Take 5 mg by mouth daily.    . Melatonin 3 MG TABS Take 6 mg by mouth at bedtime.     No current facility-administered medications for this visit.     Allergies:   Hydralazine, Clonidine derivatives, Adhesive [tape], Isosorbide, Latex, Trazodone, Benazepril, Carvedilol, Hydrazine yellow [tartrazine], Isosorbide dinitrate, Isosorbide mononitrate, Lisinopril, and Losartan   Social History:  The patient  reports that he quit smoking about 29 years ago. His smoking use included cigarettes. He quit smokeless tobacco use about 29 years ago. He reports current alcohol use. He reports that he does not use drugs.   Family History:   family history includes Heart disease in an other family member; Stroke in his brother, mother, and sister.    Review of Systems: Review of Systems  Constitutional: Negative.   HENT: Negative.    Respiratory: Negative.   Cardiovascular: Negative.   Gastrointestinal: Negative.   Musculoskeletal: Negative.        Leg weakness  Neurological: Negative.   Psychiatric/Behavioral: Negative.   All other systems reviewed and are negative.   PHYSICAL EXAM: VS:  BP (!) 110/62 (BP Location: Left Arm, Patient Position: Sitting, Cuff Size: Normal)   Pulse 60   Ht 5\' 9"  (1.753 m)   Wt 179 lb 8 oz (81.4 kg)   SpO2 97%   BMI 26.51 kg/m  , BMI Body mass index is 26.51 kg/m. Constitutional:  oriented to person, place, and time. No distress.  HENT:  Head: Grossly normal Eyes:  no discharge. No scleral icterus.  Neck: No JVD, no carotid bruits  Cardiovascular: Regular rate and rhythm, no murmurs appreciated Pulmonary/Chest: Clear to auscultation bilaterally, no wheezes or rails Abdominal: Soft.  no distension.  no tenderness.  Musculoskeletal: Normal range of motion Neurological:  normal muscle tone. Coordination normal. No atrophy Skin: Skin warm and dry Psychiatric: normal affect, pleasant  Recent Labs: 04/05/2020: ALT 19; BUN 23; Creatinine 0.9; Hemoglobin 11.1; Platelets 242; Potassium 3.1; Sodium 136; TSH 3.42    Lipid Panel Lab Results  Component Value Date   CHOL 132 05/11/2019   HDL 37 (L) 05/11/2019   LDLCALC 72 05/11/2019   TRIG 115 05/11/2019      Wt Readings from Last 3 Encounters:  07/26/20 179 lb 8 oz (81.4 kg)  05/10/20 177 lb 12.8 oz (80.6 kg)  12/28/19 181 lb 9.6 oz (82.4 kg)      ASSESSMENT AND PLAN:  Atherosclerosis of native coronary artery of native heart without angina pectoris -  Currently with no symptoms of angina. No further workup at this time. Continue current medication regimen. Stable  Bilateral carotid artery stenosis - Plan: EKG 12-Lead stable disease Followed by vascular, cholesterol at goal  PAD (peripheral artery disease) (HCC) - Plan: EKG 12-Lead Nonsmoker, lipids stable, no diabetes We will recommend carotid ultrasound in  follow-up  Essential hypertension Blood pressure stable, long discussion concerning various changes that could be made as detailed below Potassium low 3.1, He does not want to add potassium medication and track his labs We will start losartan 25 mg daily, we will stop the HCTZ They are in agreement  Paroxysmal atrial fibrillation On xarelto, metoprolol Maintaining normal sinus rhythm, denies any tachycardia palpitations concerning for arrhythmia  Mixed hyperlipidemia Continue Lipitor with Zetia, cholesterol at goal   Total encounter time more than 25 minutes  Greater than 50% was spent in counseling and coordination of care with the patient   Disposition:   F/U  12 months   Orders Placed This Encounter  Procedures  . Lipid panel  . EKG 12-Lead     Signed, Esmond Plants, M.D., Ph.D. 07/26/2020  [place], [place]

## 2020-07-26 ENCOUNTER — Ambulatory Visit: Payer: PPO | Admitting: Cardiovascular Disease

## 2020-07-26 ENCOUNTER — Encounter: Payer: Self-pay | Admitting: Cardiovascular Disease

## 2020-07-26 ENCOUNTER — Other Ambulatory Visit: Payer: Self-pay

## 2020-07-26 VITALS — BP 110/62 | HR 60 | Ht 69.0 in | Wt 179.5 lb

## 2020-07-26 DIAGNOSIS — E782 Mixed hyperlipidemia: Secondary | ICD-10-CM

## 2020-07-26 DIAGNOSIS — I6523 Occlusion and stenosis of bilateral carotid arteries: Secondary | ICD-10-CM | POA: Diagnosis not present

## 2020-07-26 DIAGNOSIS — I10 Essential (primary) hypertension: Secondary | ICD-10-CM

## 2020-07-26 DIAGNOSIS — I701 Atherosclerosis of renal artery: Secondary | ICD-10-CM

## 2020-07-26 DIAGNOSIS — I739 Peripheral vascular disease, unspecified: Secondary | ICD-10-CM | POA: Diagnosis not present

## 2020-07-26 DIAGNOSIS — I480 Paroxysmal atrial fibrillation: Secondary | ICD-10-CM | POA: Diagnosis not present

## 2020-07-26 DIAGNOSIS — I25118 Atherosclerotic heart disease of native coronary artery with other forms of angina pectoris: Secondary | ICD-10-CM | POA: Diagnosis not present

## 2020-07-26 MED ORDER — HYDROCHLOROTHIAZIDE 25 MG PO TABS: 25.0000 mg | ORAL_TABLET | Freq: Every day | ORAL | 2 refills | Status: DC | PRN

## 2020-07-26 MED ORDER — LOSARTAN POTASSIUM 25 MG PO TABS: 25.0000 mg | ORAL_TABLET | Freq: Every day | ORAL | 3 refills | Status: DC

## 2020-07-26 NOTE — Patient Instructions (Addendum)
Medication Instructions:  Hold the HCTZ, take only as needed of ankle swelling or high blood pressure  Start losartan 25 mg daily instead   If you need a refill on your cardiac medications before your next appointment, please call your pharmacy.    Lab work: Lipids at [facility], order to go Make sure not to eat or drink anything after midnight before with only sip of water with pills.    If you have labs (blood work) drawn today and your tests are completely normal, you will receive your results only by: Marland Kitchen MyChart Message (if you have MyChart) OR . A paper copy in the mail If you have any lab test that is abnormal or we need to change your treatment, we will call you to review the results.   Testing/Procedures: No new testing needed   Follow-Up: At [facility], you and your health needs are our priority.  As part of our continuing mission to provide you with exceptional heart care, we have created designated Provider Care Teams.  These Care Teams include your primary Cardiologist (physician) and Advanced Practice Providers (APPs -  Physician Assistants and Nurse Practitioners) who all work together to provide you with the care you need, when you need it.  . You will need a follow up appointment in 12 months   . Providers on your designated Care Team:   . Murray Hodgkins, NP . Christell Faith, PA-C . Marrianne Mood, PA-C  Any Other Special Instructions Will Be Listed Below (If Applicable).  COVID-19 Vaccine Information can be found at: ShippingScam.co.uk For questions related to vaccine distribution or appointments, please email [email or web address] or call [phone].

## 2020-07-27 DIAGNOSIS — I739 Peripheral vascular disease, unspecified: Secondary | ICD-10-CM | POA: Diagnosis not present

## 2020-07-27 DIAGNOSIS — I701 Atherosclerosis of renal artery: Secondary | ICD-10-CM | POA: Diagnosis not present

## 2020-07-27 DIAGNOSIS — I6523 Occlusion and stenosis of bilateral carotid arteries: Secondary | ICD-10-CM | POA: Diagnosis not present

## 2020-07-27 DIAGNOSIS — I480 Paroxysmal atrial fibrillation: Secondary | ICD-10-CM | POA: Diagnosis not present

## 2020-07-27 DIAGNOSIS — I10 Essential (primary) hypertension: Secondary | ICD-10-CM | POA: Diagnosis not present

## 2020-07-27 DIAGNOSIS — E782 Mixed hyperlipidemia: Secondary | ICD-10-CM | POA: Diagnosis not present

## 2020-08-03 ENCOUNTER — Other Ambulatory Visit: Payer: Self-pay | Admitting: Physician Assistant

## 2020-08-03 DIAGNOSIS — E039 Hypothyroidism, unspecified: Secondary | ICD-10-CM

## 2020-08-03 NOTE — Telephone Encounter (Signed)
Requested Prescriptions  Pending Prescriptions Disp Refills  . levothyroxine (SYNTHROID) 25 MCG tablet [Pharmacy Med Name: LEVOTHYROXINE SODIUM 25 MCG TAB] 90 tablet 1    Sig: TAKE ONE TABLET BY MOUTH EVERY DAY BEFORE BREAKFAST     Endocrinology:  Hypothyroid Agents Failed - 08/03/2020  2:10 PM      Failed - TSH needs to be rechecked within 3 months after an abnormal result. Refill until TSH is due.      Passed - TSH in normal range and within 360 days    TSH  Date Value Ref Range Status  04/05/2020 3.42 0.41 - 5.90 Final  05/11/2019 3.950 0.450 - 4.500 uIU/mL Final         Passed - Valid encounter within last 12 months    Recent Outpatient Visits          8 months ago Pre-op examination   [facility], [place], [place]   10 months ago Right foot pain   [facility] [place], [place]   1 year ago Anemia, unspecified type   [facility] [place], Wendee [place], [place]   1 year ago Annual physical exam   [facility] Trinna Post, [place]   1 year ago Traumatic hematoma of finger, initial encounter   [facility] [place], [place]bie [place], [place]      Future Appointments            In 9 months Terrilee Croak, [place], PA-C [employer], [place]

## 2020-08-09 ENCOUNTER — Telehealth: Payer: Self-pay | Admitting: Cardiovascular Disease

## 2020-08-09 DIAGNOSIS — F0150 Vascular dementia without behavioral disturbance: Secondary | ICD-10-CM | POA: Diagnosis not present

## 2020-08-09 DIAGNOSIS — F0280 Dementia in other diseases classified elsewhere without behavioral disturbance: Secondary | ICD-10-CM | POA: Diagnosis not present

## 2020-08-09 DIAGNOSIS — G4733 Obstructive sleep apnea (adult) (pediatric): Secondary | ICD-10-CM | POA: Diagnosis not present

## 2020-08-09 DIAGNOSIS — G309 Alzheimer's disease, unspecified: Secondary | ICD-10-CM | POA: Diagnosis not present

## 2020-08-09 DIAGNOSIS — E538 Deficiency of other specified B group vitamins: Secondary | ICD-10-CM | POA: Diagnosis not present

## 2020-08-09 DIAGNOSIS — R2689 Other abnormalities of gait and mobility: Secondary | ICD-10-CM | POA: Diagnosis not present

## 2020-08-09 NOTE — Telephone Encounter (Signed)
Patient had [facility] on [address] draw lipid no results in   Patient wants to know if handicapped form is ready

## 2020-08-09 NOTE — Telephone Encounter (Signed)
Spoke with patient and reviewed that paperwork has been completed and he can pick up tomorrow 08/10/20. He verbalized understanding with no further questions at this time.

## 2020-08-30 ENCOUNTER — Telehealth: Payer: Self-pay | Admitting: Cardiovascular Disease

## 2020-08-30 NOTE — Telephone Encounter (Signed)
Patient spouse calling Xarelto is getting too expensive Would like to discuss other medications or assistance options Please call to discuss

## 2020-08-31 DIAGNOSIS — Z85828 Personal history of other malignant neoplasm of skin: Secondary | ICD-10-CM | POA: Diagnosis not present

## 2020-08-31 DIAGNOSIS — L570 Actinic keratosis: Secondary | ICD-10-CM | POA: Diagnosis not present

## 2020-08-31 DIAGNOSIS — Z872 Personal history of diseases of the skin and subcutaneous tissue: Secondary | ICD-10-CM | POA: Diagnosis not present

## 2020-08-31 DIAGNOSIS — X32XXXA Exposure to sunlight, initial encounter: Secondary | ICD-10-CM | POA: Diagnosis not present

## 2020-08-31 DIAGNOSIS — Z08 Encounter for follow-up examination after completed treatment for malignant neoplasm: Secondary | ICD-10-CM | POA: Diagnosis not present

## 2020-09-06 DIAGNOSIS — L728 Other follicular cysts of the skin and subcutaneous tissue: Secondary | ICD-10-CM | POA: Diagnosis not present

## 2020-09-06 DIAGNOSIS — L720 Epidermal cyst: Secondary | ICD-10-CM | POA: Diagnosis not present

## 2020-09-07 NOTE — Telephone Encounter (Signed)
Spoke to patient's wife, ok per DPR.  The have reached the donut hole. Gave her the number to Xarelto patient assistance [phone]. Advised her to call and start the process of applying. She was pleased to do that. Patient has about 2 weeks of medication left at this time. She will let us know if he gets close to running out. She will also let us know if patient does not qualify so that we can discuss other options.

## 2020-09-07 NOTE — Telephone Encounter (Signed)
Patient calling to check status of advise  

## 2020-09-11 ENCOUNTER — Other Ambulatory Visit
Admission: RE | Admit: 2020-09-11 | Discharge: 2020-09-11 | Disposition: A | Discharge status: Home / Self Care | Payer: PPO | Attending: Cardiovascular Disease | Admitting: Cardiovascular Disease

## 2020-09-11 DIAGNOSIS — I480 Paroxysmal atrial fibrillation: Secondary | ICD-10-CM | POA: Diagnosis not present

## 2020-09-11 DIAGNOSIS — I701 Atherosclerosis of renal artery: Secondary | ICD-10-CM | POA: Diagnosis not present

## 2020-09-11 DIAGNOSIS — I25118 Atherosclerotic heart disease of native coronary artery with other forms of angina pectoris: Secondary | ICD-10-CM

## 2020-09-11 DIAGNOSIS — I6523 Occlusion and stenosis of bilateral carotid arteries: Secondary | ICD-10-CM | POA: Diagnosis not present

## 2020-09-11 DIAGNOSIS — I10 Essential (primary) hypertension: Secondary | ICD-10-CM

## 2020-09-11 DIAGNOSIS — I739 Peripheral vascular disease, unspecified: Secondary | ICD-10-CM

## 2020-09-11 DIAGNOSIS — E782 Mixed hyperlipidemia: Secondary | ICD-10-CM | POA: Diagnosis not present

## 2020-09-11 LAB — LIPID PANEL
Cholesterol: 131 mg/dL (ref 0–200)
HDL: 32 mg/dL — ABNORMAL LOW (ref >40)
LDL Cholesterol: 74 mg/dL (ref 0–99)
Total CHOL/HDL Ratio: 4.1 RATIO
Triglycerides: 126 mg/dL (ref <150)
VLDL: 25 mg/dL (ref 0–40)

## 2020-09-18 ENCOUNTER — Telehealth: Payer: Self-pay | Admitting: Cardiovascular Disease

## 2020-09-18 DIAGNOSIS — G309 Alzheimer's disease, unspecified: Secondary | ICD-10-CM | POA: Diagnosis not present

## 2020-09-18 DIAGNOSIS — F0280 Dementia in other diseases classified elsewhere without behavioral disturbance: Secondary | ICD-10-CM | POA: Diagnosis not present

## 2020-09-18 DIAGNOSIS — I480 Paroxysmal atrial fibrillation: Secondary | ICD-10-CM | POA: Diagnosis not present

## 2020-09-18 DIAGNOSIS — F0150 Vascular dementia without behavioral disturbance: Secondary | ICD-10-CM | POA: Diagnosis not present

## 2020-09-18 DIAGNOSIS — G4733 Obstructive sleep apnea (adult) (pediatric): Secondary | ICD-10-CM | POA: Diagnosis not present

## 2020-09-18 NOTE — Telephone Encounter (Signed)
Spoke with patients wife per release form and reviewed test results and recommendations. She verbalized understanding with no further questions at this time.

## 2020-09-18 NOTE — Telephone Encounter (Signed)
Patients wife calling in for lipid panel results  Please advise

## 2020-10-13 ENCOUNTER — Other Ambulatory Visit: Payer: Self-pay

## 2020-10-13 ENCOUNTER — Ambulatory Visit (INDEPENDENT_AMBULATORY_CARE_PROVIDER_SITE_OTHER): Payer: PPO

## 2020-10-13 DIAGNOSIS — Z23 Encounter for immunization: Secondary | ICD-10-CM | POA: Diagnosis not present

## 2020-10-18 ENCOUNTER — Other Ambulatory Visit: Payer: Self-pay | Admitting: *Deleted

## 2020-10-18 ENCOUNTER — Other Ambulatory Visit: Payer: Self-pay

## 2020-10-18 MED ORDER — NITROGLYCERIN 0.4 MG/SPRAY TL SOLN: 1.0000 | 1 refills | Status: AC | PRN

## 2020-10-18 MED ORDER — NITROGLYCERIN 0.4 MG/SPRAY TL SOLN: 1.0000 | 1 refills | Status: DC | PRN

## 2020-10-18 NOTE — Telephone Encounter (Signed)
*  STAT* If patient is at the pharmacy, call can be transferred to refill team.   1. Which medications need to be refilled? (please list name of each medication and dose if known) Nitroglyncerin spray  2. Which pharmacy/location (including street and city if local pharmacy) is medication to be sent to? [facility]  3. Do they need a 30 day or 90 day supply?

## 2020-10-23 ENCOUNTER — Other Ambulatory Visit: Payer: Self-pay | Admitting: Cardiovascular Disease

## 2020-10-23 MED ORDER — METOPROLOL TARTRATE 25 MG PO TABS: 12.5000 mg | ORAL_TABLET | Freq: Two times a day (BID) | ORAL | 3 refills | Status: AC

## 2020-10-23 NOTE — Telephone Encounter (Signed)
Patient spouse says that pharmacy will not refill because he is not due but that patient will be out in 2 weeks.  Please advise if any complications refilling.   *STAT* If patient is at the pharmacy, call can be transferred to refill team.   1. Which medications need to be refilled? (please list name of each medication and dose if known) metoprolol tartrate 25 MG 0.5 tablets 2 times daily  2. Which pharmacy/location (including street and city if local pharmacy) is medication to be sent to? [facility]   3. Do they need a 30 day or 90 day supply? 90 day

## 2020-10-23 NOTE — Telephone Encounter (Signed)
Requested Prescriptions   Signed Prescriptions Disp Refills   metoprolol tartrate (LOPRESSOR) 25 MG tablet 90 tablet 3    Sig: Take 0.5 tablets (12.5 mg total) by mouth 2 (two) times daily.    Authorizing Provider: GOLLAN, TIMOTHY J    Ordering User: Marabeth Melland C    

## 2020-10-31 ENCOUNTER — Telehealth: Payer: Self-pay | Admitting: Cardiovascular Disease

## 2020-10-31 NOTE — Telephone Encounter (Signed)
Patient wife says the handicapped form was sent for the second time but she never received it.    Please advise.  Can leave a detailed msg if no ans

## 2020-11-02 NOTE — Telephone Encounter (Signed)
Spoke with patients wife per release form. She states that she wanted to follow up on handicap placard. Reviewed that I do still have it and just waiting on Dr. Rockey Situ to sign. She then wanted to discuss his metoprolol medication. Patient takes 1/2 tablet twice daily and he is going to run out on the 11th and pharmacy will not refill until the 25th. Let her know that I would call the pharmacy to check on the problem. She verbalized understanding with no further questions at this time.

## 2020-11-02 NOTE — Telephone Encounter (Signed)
Spoke with Judeen Hammans at pharmacy and she states that they will fill it now and it could have been called in too early. They will fill it now and have ready for patient to pick up. Let her know that I would call patients wife per release form and would let them know they are processing. She was appreciative.

## 2020-11-02 NOTE — Telephone Encounter (Signed)
Spoke with patients wife and reviewed that prescription was being filled and would be ready before he runs out on the 11th. Let her know that I will make sure form is signed and will put in the mail. She verbalized understanding with no further questions at this time.

## 2020-11-11 ENCOUNTER — Other Ambulatory Visit: Payer: Self-pay | Admitting: Gastroenterology

## 2020-12-05 ENCOUNTER — Other Ambulatory Visit: Payer: Self-pay

## 2020-12-05 ENCOUNTER — Ambulatory Visit: Payer: PPO | Admitting: Podiatry

## 2020-12-05 DIAGNOSIS — L989 Disorder of the skin and subcutaneous tissue, unspecified: Secondary | ICD-10-CM

## 2020-12-05 DIAGNOSIS — M7751 Other enthesopathy of right foot: Secondary | ICD-10-CM | POA: Diagnosis not present

## 2020-12-05 NOTE — Progress Notes (Signed)
   HPI: Patient presents today for follow-up treatment evaluation regarding right foot pain.  Patient was last seen approximately 6 months ago at which time he complained of symptomatic callus lesions which were trimmed and he says that it feels much better.  Patient has recently experienced a return of the pain to the right foot.   Past Medical History:  Diagnosis Date  . Cancer ([place])    basel cell carcinoma left ear  . Carotid artery occlusion    s/p right carotid endarterectomy  . Chronic kidney disease    renal artery stenosis  . Coronary artery disease    s/p coronary bypass graft surgery in 1992, stable  . GERD (gastroesophageal reflux disease)   . Hyperlipidemia   . Hypertension   . Myocardial infarction ([place])   . PVD (peripheral vascular disease) ([place])    s/p multiple revascularization procedures.  . Shortness of breath   . Sleep apnea      Physical Exam: General: The patient is alert and oriented x3 in no acute distress.  Dermatology: Skin is warm, dry and supple bilateral lower extremities. Negative for open lesions or macerations.  Hyperkeratotic callus tissue with a central nucleated core noted to the sub-fifth MPJ right foot  Vascular: Palpable pedal pulses bilaterally. No edema or erythema noted. Capillary refill within normal limits.  Neurological: Epicritic and protective threshold grossly intact bilaterally.   Musculoskeletal Exam: Range of motion within normal limits to all pedal and ankle joints bilateral. Muscle strength 5/5 in all groups bilateral.  There is a significant amount of pain on palpation and range of motion also to the fifth MTPJ.  Assessment: 1.  Porokeratosis sub-fifth MPJ right foot   Plan of Care:  1. Patient evaluated.  2.  Excisional debridement of the hyperkeratotic callus tissue was performed using chisel blade without incident or bleeding.  Salicylic acid and a Band-Aid was applied 3.  Return to clinic as needed      Edrick Kins, DPM [facility]  Dr. Edrick Kins, DPM    [address], [place] [address]                Office [phone]  Fax [phone]

## 2020-12-20 ENCOUNTER — Telehealth: Payer: Self-pay | Admitting: Podiatry

## 2020-12-20 NOTE — Telephone Encounter (Signed)
Pt's wife called stating the corn that was removed has started bleeding. She also states they've done everything they were supposed to, things were fine until yesterday. Please advise.

## 2020-12-20 NOTE — Telephone Encounter (Signed)
"  He was treated for a corn.  It was cut off two weeks ago today.  We've been doing what Dr. Amalia Hailey recommended, which is put Salicylic acid on and a band.  Monday it started bleeding a little bit and last night, it bleed a lot.  We're going out of town tomorrow and want to know what we should do.  I knew it was getting close to 4:30 pm, so we wanted to know as soon as possible."  I'll send another message to Dr. Amalia Hailey and his nurse.  "Do you think I will hear something today?"  Yes, hopefully, you will.

## 2021-01-17 ENCOUNTER — Other Ambulatory Visit: Payer: Self-pay | Admitting: Gastroenterology

## 2021-01-23 ENCOUNTER — Encounter: Payer: Self-pay | Admitting: Gastroenterology

## 2021-01-23 ENCOUNTER — Ambulatory Visit: Payer: PPO | Admitting: Gastroenterology

## 2021-01-23 ENCOUNTER — Other Ambulatory Visit: Payer: Self-pay

## 2021-01-23 VITALS — BP 78/42 | HR 61 | Ht 69.0 in | Wt 189.0 lb

## 2021-01-23 DIAGNOSIS — G8929 Other chronic pain: Secondary | ICD-10-CM | POA: Diagnosis not present

## 2021-01-23 DIAGNOSIS — R1031 Right lower quadrant pain: Secondary | ICD-10-CM

## 2021-01-23 MED ORDER — DESIPRAMINE HCL 25 MG PO TABS: 25.0000 mg | ORAL_TABLET | Freq: Every day | ORAL | 3 refills | Status: DC

## 2021-01-23 NOTE — Progress Notes (Signed)
Primary Care Physician: Paulene Floor  Primary Gastroenterologist:  Dr. [name]  Chief Complaint  Patient presents with  . Abdominal Pain    HPI: Clifford Aguilar is a 80 y.o. male here with continued lower abdominal pain. This patient has been seen by me in the past for similar symptoms and again reports that it is worse when he is under a lot of stress.  The patient states that he is still having problems with one of his daughters which exacerbates the pain.  He also reports that he has loose bowel movements but his wife and him endorse him drinking large amounts of milk.  He states he loves dairy products.  He does report that he is been having loose bowel movements as of late.  There is no report of any unexplained weight loss or rectal bleeding.  He also denies any fevers or chills. The wife reports that she has irritable bowel syndrome for which she takes Prozac and that helps her greatly. The pain does not wake him up from her sleep nor does he have it inability night.  Past Medical History:  Diagnosis Date  . Cancer ([place])    basel cell carcinoma left ear  . Carotid artery occlusion    s/p right carotid endarterectomy  . Chronic kidney disease    renal artery stenosis  . Coronary artery disease    s/p coronary bypass graft surgery in 1992, stable  . GERD (gastroesophageal reflux disease)   . Hyperlipidemia   . Hypertension   . Myocardial infarction ([place])   . PVD (peripheral vascular disease) ([place])    s/p multiple revascularization procedures.  . Shortness of breath   . Sleep apnea     Current Outpatient Medications  Medication Sig Dispense Refill  . ARIPiprazole (ABILIFY) 2 MG tablet Take 2 mg by mouth daily.    Marland Kitchen atorvastatin (LIPITOR) 40 MG tablet TAKE 1 TABLET BY MOUTH DAILY AT 6PM 90 tablet 3  . clopidogrel (PLAVIX) 75 MG tablet TAKE ONE TABLET BY MOUTH EVERY DAY 90 tablet 3  . Coenzyme Q10 (CO Q-10) 100 MG CAPS Take 100 mg by mouth daily.    Marland Kitchen  desipramine (NORPRAMIN) 25 MG tablet Take 1 tablet (25 mg total) by mouth at bedtime. 30 tablet 3  . dicyclomine (BENTYL) 20 MG tablet TAKE ONE TABLET EVERY 6 HOURS (Patient taking differently: Taking 2 pills a day.) 120 tablet 3  . dicyclomine (BENTYL) 20 MG tablet 1 to 2 tablets daily    . dicyclomine (BENTYL) 20 MG tablet TAKE ONE TABLET EVERY 6 HOURS 120 tablet 0  . dicyclomine (BENTYL) 20 MG tablet TAKE ONE TABLET EVERY 6 HOURS 120 tablet 6  . doxazosin (CARDURA) 8 MG tablet TAKE ONE TABLET TWICE DAILY 180 tablet 3  . ezetimibe (ZETIA) 10 MG tablet Take 1 tablet (10 mg total) by mouth daily. 90 tablet 3  . Garlic 1017 MG CAPS Take 7,000 mg by mouth daily.     . hydrochlorothiazide (HYDRODIURIL) 25 MG tablet Take 1 tablet (25 mg total) by mouth daily as needed (As needed for ankle swelling or high blood pressure). 90 tablet 2  . hyoscyamine (LEVSIN SL) 0.125 MG SL tablet Place 1 tablet (0.125 mg total) under the tongue every 4 (four) hours as needed. 60 tablet 3  . lansoprazole (PREVACID) 15 MG capsule Take 1 capsule (15 mg total) by mouth daily at 12 noon. 30 capsule 6  . levothyroxine (SYNTHROID) 25 MCG  tablet TAKE ONE TABLET BY MOUTH EVERY DAY BEFORE BREAKFAST 90 tablet 1  . losartan (COZAAR) 25 MG tablet Take 1 tablet (25 mg total) by mouth daily. As needed 90 tablet 3  . Melatonin 3 MG TABS Take 6 mg by mouth at bedtime.    . memantine (NAMENDA) 5 MG tablet Take 5 mg by mouth 2 (two) times daily.    . metoprolol tartrate (LOPRESSOR) 25 MG tablet Take 0.5 tablets (12.5 mg total) by mouth 2 (two) times daily. 90 tablet 3  . nitroGLYCERIN (NITROLINGUAL) 0.4 MG/SPRAY spray Place 1 spray under the tongue every 5 (five) minutes x 3 doses as needed for chest pain. 12 g 1  . polyethylene glycol (MIRALAX / GLYCOLAX) packet Take 17 g by mouth daily. 90 packet 3  . rivaroxaban (XARELTO) 20 MG TABS tablet TAKE 1 TABLET BY MOUTH DAILY WITH SUPPER 90 tablet 1  . Saw Palmetto 450 MG CAPS Take 450 mg by  mouth daily.    Marland Kitchen escitalopram (LEXAPRO) 5 MG tablet Take 5 mg by mouth daily.     No current facility-administered medications for this visit.    Allergies as of 01/23/2021 - Review Complete 01/23/2021  Allergen Reaction Noted  . Hydralazine Rash 03/16/2014  . Clonidine derivatives Other (See Comments) 11/27/2011  . Adhesive [tape]    . Isosorbide  08/13/2017  . Latex Other (See Comments) 08/13/2017  . Trazodone Other (See Comments) 02/16/2018  . Benazepril Other (See Comments) 08/19/2016  . Carvedilol Other (See Comments) 01/27/2014  . Hydrazine yellow [tartrazine] Itching and Rash 03/03/2014  . Isosorbide dinitrate Rash 03/03/2014  . Isosorbide mononitrate Rash 11/26/2011  . Lisinopril Other (See Comments) 06/29/2012  . Losartan Other (See Comments) 03/17/2017    ROS:  General: Negative for anorexia, weight loss, fever, chills, fatigue, weakness. ENT: Negative for hoarseness, difficulty swallowing , nasal congestion. CV: Negative for chest pain, angina, palpitations, dyspnea on exertion, peripheral edema.  Respiratory: Negative for dyspnea at rest, dyspnea on exertion, cough, sputum, wheezing.  GI: See history of present illness. GU:  Negative for dysuria, hematuria, urinary incontinence, urinary frequency, nocturnal urination.  Endo: Negative for unusual weight change.    Physical Examination:   BP (!) 78/42   Pulse 61   Ht 5\' 9"  (1.753 m)   Wt 189 lb (85.7 kg)   BMI 27.91 kg/m   General: Well-nourished, well-developed in no acute distress.  Eyes: No icterus. Conjunctivae pink. Lungs: Clear to auscultation bilaterally. Non-labored. Heart: Regular rate and rhythm, no murmurs rubs or gallops.  Abdomen: Bowel sounds are normal, nontender, nondistended, no hepatosplenomegaly or masses, no abdominal bruits or hernia , no rebound or guarding.   Extremities: No lower extremity edema. No clubbing or deformities. Neuro: Alert and oriented x 3.  Grossly intact. Skin: Warm  and dry, no jaundice.   Psych: Alert and cooperative, normal mood and affect.  Labs:    Imaging Studies: No results found.  Assessment and Plan:   Clifford Aguilar is a 80 y.o. y/o male who comes in today with a history of long-standing abdominal discomfort.  The patient states it is worse with stress. The patient has been told that his lower abdominal pain can be exacerbated by his dairy intake which could also be the cause of his loose bowel movements.  He has been told to stop no for one week and see if his symptoms improve.  He has also been called in a prescription for desipramine to be taken before bedtime  to help with his abdominal discomfort and spasms for his functional bowel disorder due to his stress.  He has been told to fill his prescription if his cessation of milk does not improve his symptoms.  The patient has been explained the plan and agrees with it.     [name], MD. Marval Regal    Note: This dictation was prepared with Dragon dictation along with smaller phrase technology. Any transcriptional errors that result from this process are unintentional.

## 2021-01-24 DIAGNOSIS — Z961 Presence of intraocular lens: Secondary | ICD-10-CM | POA: Diagnosis not present

## 2021-01-30 ENCOUNTER — Telehealth: Payer: Self-pay | Admitting: Gastroenterology

## 2021-01-30 NOTE — Telephone Encounter (Signed)
Let the patient know that I tried to call them and I got voicemail with a full mailbox at 5:11 pm on 2/1

## 2021-01-30 NOTE — Telephone Encounter (Signed)
Patient's wife lvm.... gastric pain is worse and vomiting. Please call. Has been off milk for over a week. What can he do?

## 2021-01-31 NOTE — Telephone Encounter (Signed)
Patient's wife called back asking for a return phone call to [phone].  She said that her husband is vomiting & abdominal pain.

## 2021-02-01 ENCOUNTER — Other Ambulatory Visit: Payer: Self-pay

## 2021-02-01 DIAGNOSIS — R1084 Generalized abdominal pain: Secondary | ICD-10-CM

## 2021-02-12 DIAGNOSIS — N400 Enlarged prostate without lower urinary tract symptoms: Secondary | ICD-10-CM | POA: Diagnosis not present

## 2021-02-12 DIAGNOSIS — N281 Cyst of kidney, acquired: Secondary | ICD-10-CM | POA: Diagnosis not present

## 2021-02-12 DIAGNOSIS — I701 Atherosclerosis of renal artery: Secondary | ICD-10-CM | POA: Diagnosis not present

## 2021-02-12 DIAGNOSIS — I10 Essential (primary) hypertension: Secondary | ICD-10-CM | POA: Diagnosis not present

## 2021-02-13 DIAGNOSIS — F0280 Dementia in other diseases classified elsewhere without behavioral disturbance: Secondary | ICD-10-CM | POA: Diagnosis not present

## 2021-02-13 DIAGNOSIS — D649 Anemia, unspecified: Secondary | ICD-10-CM | POA: Diagnosis not present

## 2021-02-13 DIAGNOSIS — G4733 Obstructive sleep apnea (adult) (pediatric): Secondary | ICD-10-CM | POA: Diagnosis not present

## 2021-02-13 DIAGNOSIS — Z5329 Procedure and treatment not carried out because of patient's decision for other reasons: Secondary | ICD-10-CM | POA: Diagnosis not present

## 2021-02-13 DIAGNOSIS — G309 Alzheimer's disease, unspecified: Secondary | ICD-10-CM | POA: Diagnosis not present

## 2021-02-13 DIAGNOSIS — F0150 Vascular dementia without behavioral disturbance: Secondary | ICD-10-CM | POA: Diagnosis not present

## 2021-02-14 ENCOUNTER — Other Ambulatory Visit: Payer: Self-pay

## 2021-02-14 ENCOUNTER — Telehealth: Payer: Self-pay | Admitting: Cardiovascular Disease

## 2021-02-14 ENCOUNTER — Ambulatory Visit
Admission: RE | Admit: 2021-02-14 | Discharge: 2021-02-14 | Disposition: A | Discharge status: Home / Self Care | Payer: PPO | Source: Ambulatory Visit | Attending: Gastroenterology | Admitting: Gastroenterology

## 2021-02-14 DIAGNOSIS — R1084 Generalized abdominal pain: Secondary | ICD-10-CM | POA: Insufficient documentation

## 2021-02-14 DIAGNOSIS — R109 Unspecified abdominal pain: Secondary | ICD-10-CM | POA: Diagnosis not present

## 2021-02-14 MED ORDER — IOHEXOL 300 MG/ML  SOLN
100.0000 mL | Freq: Once | INTRAMUSCULAR | Status: AC | PRN
  Administered 2021-02-14: 100 mL via INTRAVENOUS

## 2021-02-14 NOTE — Telephone Encounter (Signed)
Was able to return wife's phone call (DPR approved).  Elder Negus stated that Mr. Dini saw Dr Candiss Norse and his BP was low, 77/49 and 80/53 while standing so Dr Candiss Norse changed doxazosin 8 MG to 1 tablet at night. Elder Negus wanted to make Dr Rockey Situ aware and make sure agreed with change. She also wanted pt to be seen in clinic for a f/u visit since his next appt is not until 08/2021 and she worried pt is on too many medications dealing with his heart that it may be the reason his BP is too low. Elder Negus reports pt has not had CP, shob, or swelling. Was able to get pt schedule for 2/17 at 08:40 am with Dr. Mylo Red. Wife is very delighted and feels that "this is the thing to do with a heart doctor, not the kidney one" . Nothing further at this time.

## 2021-02-14 NOTE — Telephone Encounter (Signed)
Patient spouse calling  Patient saw Dr Candiss Norse and BP was low  Dr Candiss Norse changed doxazosin 8 MG to 1 tablet at night  Wanted to make Dr Rockey Situ aware and make sure agreed with change Please call to discuss

## 2021-02-15 ENCOUNTER — Ambulatory Visit: Payer: PPO | Admitting: Cardiology

## 2021-02-15 ENCOUNTER — Encounter: Payer: Self-pay | Admitting: Cardiology

## 2021-02-15 VITALS — BP 92/52 | HR 72 | Ht 69.0 in | Wt 192.0 lb

## 2021-02-15 DIAGNOSIS — I480 Paroxysmal atrial fibrillation: Secondary | ICD-10-CM | POA: Diagnosis not present

## 2021-02-15 DIAGNOSIS — I739 Peripheral vascular disease, unspecified: Secondary | ICD-10-CM | POA: Diagnosis not present

## 2021-02-15 DIAGNOSIS — I2510 Atherosclerotic heart disease of native coronary artery without angina pectoris: Secondary | ICD-10-CM | POA: Diagnosis not present

## 2021-02-15 DIAGNOSIS — I959 Hypotension, unspecified: Secondary | ICD-10-CM

## 2021-02-15 NOTE — Progress Notes (Signed)
Cardiology Office Note:    Date:  02/15/2021   ID:  Clifford Aguilar, DOB [birth year], MRN [id number]  PCP:  Pollak, Adriana M, [place]  Cardiologist:  Ida Rogue, MD  Advanced Practice Provider:  No care team member to display Electrophysiologist:  None       Referring MD: Trinna Post, PA-C   Chief Complaint  Patient presents with  . Follow-up    Pt needs clarification on a medication change---doxazosin    History of Present Illness:    Clifford Aguilar is a 80 y.o. male with a hx of CAD/CABG, carotid stenosis, PAD with iliac stent, A. fib who presents due to clarification of medication change.  Patient saw nephrology due to history of renal cyst yesterday and blood pressures were noted to be low with systolic in the 28Z.  Doxazosin was reduced from 8 mg to 1 mg daily.  Blood pressure has improved to systolic in the 66Q.  He states having fatigue, abdominal discomfort.  Diagnosed with irritable bowel syndrome.  Also was recommended to eat small portions which has helped with his abdominal pain.  Past Medical History:  Diagnosis Date  . Cancer ([place])    basel cell carcinoma left ear  . Carotid artery occlusion    s/p right carotid endarterectomy  . Chronic kidney disease    renal artery stenosis  . Coronary artery disease    s/p coronary bypass graft surgery in 1992, stable  . GERD (gastroesophageal reflux disease)   . Hyperlipidemia   . Hypertension   . Myocardial infarction ([place])   . PVD (peripheral vascular disease) ([place])    s/p multiple revascularization procedures.  . Shortness of breath   . Sleep apnea     Past Surgical History:  Procedure Laterality Date  . abdomial angiogram  03-16-2014  . ABDOMINAL ANGIOGRAM N/A 03/16/2014   Procedure: ABDOMINAL ANGIOGRAM;  Surgeon: Rosetta Posner, MD;  Location: [facility] CATH LAB;  Service: Cardiovascular;  Laterality: N/A;  . ABDOMINAL AORTAGRAM N/A 12/03/2011   Procedure: ABDOMINAL AORTAGRAM;   Surgeon: Serafina Mitchell, MD;  Location: [facility] CATH LAB;  Service: Cardiovascular;  Laterality: N/A;  . CAROTID ENDARTERECTOMY     Right. With 46% stenosis in the right by recent Doppler  . COLONOSCOPY WITH PROPOFOL N/A 03/12/2016   Procedure: COLONOSCOPY WITH PROPOFOL;  Surgeon: [name], MD;  Location: [facility] ENDOSCOPY;  Service: Endoscopy;  Laterality: N/A;  . CORONARY ARTERY BYPASS GRAFT  1992  . CYST REMOVAL HAND     right index knuckle  . ESOPHAGOGASTRODUODENOSCOPY (EGD) WITH PROPOFOL N/A 07/13/2019   Procedure: ESOPHAGOGASTRODUODENOSCOPY (EGD) WITH PROPOFOL;  Surgeon: [name], MD;  Location: [facility] ENDOSCOPY;  Service: Endoscopy;  Laterality: N/A;  . left common iliac  12/03/2011  . LEFT HEART CATH AND CORS/GRAFTS ANGIOGRAPHY Left 12/08/2018   Procedure: LEFT HEART CATH AND CORS/GRAFTS ANGIOGRAPHY;  Surgeon: Nelva Bush, MD;  Location: [facility] CV LAB;  Service: Cardiovascular;  Laterality: Left;  . MOHS SURGERY    . Peripheral vascular revascularization     . RENAL ANGIOGRAM  03-16-2014  . RENAL ANGIOGRAM Left 12/03/2011   Procedure: RENAL ANGIOGRAM;  Surgeon: Serafina Mitchell, MD;  Location: [facility] CATH LAB;  Service: Cardiovascular;  Laterality: Left;  lt renal artery stent  . RENAL ANGIOGRAM N/A 03/16/2014   Procedure: RENAL ANGIOGRAM;  Surgeon: Rosetta Posner, MD;  Location: [facility] CATH LAB;  Service: Cardiovascular;  Laterality: N/A;  . renal artery  stenosis  12/03/2011  . SHOULDER ARTHROSCOPY     Right Amnioplasty; Acromioclavicular joint resection and arthroscopic debridement  . Surgery for Sleep Apnea      Current Medications: Current Meds  Medication Sig  . ARIPiprazole (ABILIFY) 2 MG tablet Take 2 mg by mouth daily.  Marland Kitchen atorvastatin (LIPITOR) 40 MG tablet TAKE 1 TABLET BY MOUTH DAILY AT 6PM  . clopidogrel (PLAVIX) 75 MG tablet TAKE ONE TABLET BY MOUTH EVERY DAY  . Coenzyme Q10 (CO Q-10) 100 MG CAPS Take 100 mg by mouth daily.  Marland Kitchen desipramine (NORPRAMIN) 25 MG tablet Take  1 tablet (25 mg total) by mouth at bedtime.  . dicyclomine (BENTYL) 20 MG tablet 1 to 2 tablets daily  . doxazosin (CARDURA) 8 MG tablet TAKE ONE TABLET TWICE DAILY  . ezetimibe (ZETIA) 10 MG tablet Take 1 tablet (10 mg total) by mouth daily.  . Garlic 7829 MG CAPS Take 7,000 mg by mouth daily.   . hyoscyamine (LEVSIN SL) 0.125 MG SL tablet Place 1 tablet (0.125 mg total) under the tongue every 4 (four) hours as needed.  . lansoprazole (PREVACID) 15 MG capsule Take 1 capsule (15 mg total) by mouth daily at 12 noon.  Marland Kitchen levothyroxine (SYNTHROID) 25 MCG tablet TAKE ONE TABLET BY MOUTH EVERY DAY BEFORE BREAKFAST  . losartan (COZAAR) 25 MG tablet Take 1 tablet (25 mg total) by mouth daily. As needed  . memantine (NAMENDA) 5 MG tablet Take 5 mg by mouth 2 (two) times daily.  . metoprolol tartrate (LOPRESSOR) 25 MG tablet Take 0.5 tablets (12.5 mg total) by mouth 2 (two) times daily.  . nitroGLYCERIN (NITROLINGUAL) 0.4 MG/SPRAY spray Place 1 spray under the tongue every 5 (five) minutes x 3 doses as needed for chest pain.  . polyethylene glycol (MIRALAX / GLYCOLAX) packet Take 17 g by mouth daily.  . rivaroxaban (XARELTO) 20 MG TABS tablet TAKE 1 TABLET BY MOUTH DAILY WITH SUPPER  . Saw Palmetto 450 MG CAPS Take 450 mg by mouth daily.     Allergies:   Hydralazine, Clonidine derivatives, Adhesive [tape], Isosorbide, Latex, Trazodone, Benazepril, Carvedilol, Hydrazine yellow [tartrazine], Isosorbide dinitrate, Isosorbide mononitrate, Lisinopril, and Losartan   Social History   Socioeconomic History  . Marital status: Married    Spouse name: Not on file  . Number of children: 3  . Years of education: Not on file  . Highest education level: Some college, no degree  Occupational History  . Occupation: retired  Tobacco Use  . Smoking status: Former Smoker    Types: Cigarettes    Quit date: 12/30/1990    Years since quitting: 30.1  . Smokeless tobacco: Former Systems developer    Quit date: 01/10/1991  .  Tobacco comment: quit smoking in 1992  Vaping Use  . Vaping Use: Never used  Substance and Sexual Activity  . Alcohol use: Yes    Alcohol/week: 0.0 - 2.0 standard drinks    Comment: maybe 1-2 a month  . Drug use: No  . Sexual activity: Yes  Other Topics Concern  . Not on file  Social History Narrative  . Not on file   Social Determinants of Health   Financial Resource Strain: Low Risk   . Difficulty of Paying Living Expenses: Not hard at all  Food Insecurity: No Food Insecurity  . Worried About Charity fundraiser in the Last Year: Never true  . Ran Out of Food in the Last Year: Never true  Transportation Needs: No Transportation Needs  .  Lack of Transportation (Medical): No  . Lack of Transportation (Non-Medical): No  Physical Activity: Sufficiently Active  . Days of Exercise per Week: 6 days  . Minutes of Exercise per Session: 60 min  Stress: No Stress Concern Present  . Feeling of Stress : Not at all  Social Connections: Moderately Integrated  . Frequency of Communication with Friends and Family: More than three times a week  . Frequency of Social Gatherings with Friends and Family: Once a week  . Attends Religious Services: More than 4 times per year  . Active Member of Clubs or Organizations: No  . Attends Archivist Meetings: Never  . Marital Status: Married     Family History: The patient's family history includes Heart disease in an other family member; Stroke in his brother, mother, and sister.  ROS:   Please see the history of present illness.     All other systems reviewed and are negative.  EKGs/Labs/Other Studies Reviewed:    The following studies were reviewed today:   EKG:  EKG is  ordered today.  The ekg ordered today demonstrates sinus rhythm, heart rate 70  Recent Labs: 04/05/2020: ALT 19; BUN 23; Creatinine 0.9; Hemoglobin 11.1; Platelets 242; Potassium 3.1; Sodium 136; TSH 3.42  Recent Lipid Panel    Component Value Date/Time   CHOL  131 09/11/2020 0822   CHOL 132 05/11/2019 1009   TRIG 126 09/11/2020 0822   HDL 32 (L) 09/11/2020 0822   HDL 37 (L) 05/11/2019 1009   CHOLHDL 4.1 09/11/2020 0822   VLDL 25 09/11/2020 0822   LDLCALC 74 09/11/2020 0822   LDLCALC 72 05/11/2019 1009     Risk Assessment/Calculations:      Physical Exam:    VS:  BP (!) 92/52   Pulse 72   Ht 5\' 9"  (1.753 m)   Wt 192 lb (87.1 kg)   BMI 28.35 kg/m     Wt Readings from Last 3 Encounters:  02/15/21 192 lb (87.1 kg)  01/23/21 189 lb (85.7 kg)  07/26/20 179 lb 8 oz (81.4 kg)     GEN:  Well nourished, well developed in no acute distress HEENT: Normal NECK: No JVD; No carotid bruits LYMPHATICS: No lymphadenopathy CARDIAC: RRR, no murmurs, rubs, gallops RESPIRATORY:  Clear to auscultation without rales, wheezing or rhonchi  ABDOMEN: Soft, non-tender, non-distended MUSCULOSKELETAL:  No edema; No deformity  SKIN: Warm and dry NEUROLOGIC:  Alert and oriented x 3 PSYCHIATRIC:  Normal affect   ASSESSMENT:    1. Hypotension, unspecified hypotension type   2. Coronary artery disease involving native coronary artery of native heart without angina pectoris   3. PAF (paroxysmal atrial fibrillation) ([place])   4. PVD (peripheral vascular disease) ([place])    PLAN:    In order of problems listed above:  1. Hypotension, BP improved but still on the low side.  Agree with reducing doxazosin.  Monitor blood pressures closely.  Will stop losartan for additional BP benefit. 2. History of CAD/CABG.  Last EF 2019 preserved, EF 60%.  Denies chest pain, continue Lopressor, Lipitor, Xarelto, Plavix Zetia. 3. Paroxysmal atrial fibrillation, Xarelto, Lopressor. 4. PAD, interventions, iliac stents.  Plavix, Lipitor, Zetia.  Patient may have "abdominal angina", abdominal pain improved.  Eating smaller portions.  History of renal stents.  Recommend follow-up with vascular surgery.  Follow-up with Dr. Adora Fridge in 1 month.     Medication  Adjustments/Labs and Tests Ordered: Current medicines are reviewed at length with the patient today.  Concerns  regarding medicines are outlined above.  Orders Placed This Encounter  Procedures  . EKG 12-Lead   No orders of the defined types were placed in this encounter.   Patient Instructions  Medication Instructions:  Your physician has recommended you make the following change in your medication:   1.  STOP taking your Losartan.   *If you need a refill on your cardiac medications before your next appointment, please call your pharmacy*   Lab Work: None ordered If you have labs (blood work) drawn today and your tests are completely normal, you will receive your results only by: Marland Kitchen MyChart Message (if you have MyChart) OR . A paper copy in the mail If you have any lab test that is abnormal or we need to change your treatment, we will call you to review the results.   Testing/Procedures: None ordered   Follow-Up: At [facility], you and your health needs are our priority.  As part of our continuing mission to provide you with exceptional heart care, we have created designated Provider Care Teams.  These Care Teams include your primary Cardiologist (physician) and Advanced Practice Providers (APPs -  Physician Assistants and Nurse Practitioners) who all work together to provide you with the care you need, when you need it.  We recommend signing up for the patient portal called "MyChart".  Sign up information is provided on this After Visit Summary.  MyChart is used to connect with patients for Virtual Visits (Telemedicine).  Patients are able to view lab/test results, encounter notes, upcoming appointments, etc.  Non-urgent messages can be sent to your provider as well.   To learn more about what you can do with MyChart, go to [email or web address].    Your next appointment:   1 month(s)  The format for your next appointment:   In Person  Provider:   You may see Ida Rogue, MD or one of the following Advanced Practice Providers on your designated Care Team:     Murray Hodgkins, NP  Christell Faith, PA-C  Marrianne Mood, PA-C  Cadence [place], [place]  Laurann Montana, NP    Other Instructions      Signed, Kate Sable, MD  02/15/2021 12:56 PM    [place]

## 2021-02-15 NOTE — Patient Instructions (Signed)
Medication Instructions:  Your physician has recommended you make the following change in your medication:   1.  STOP taking your Losartan.   *If you need a refill on your cardiac medications before your next appointment, please call your pharmacy*   Lab Work: None ordered If you have labs (blood work) drawn today and your tests are completely normal, you will receive your results only by: Marland Kitchen MyChart Message (if you have MyChart) OR . A paper copy in the mail If you have any lab test that is abnormal or we need to change your treatment, we will call you to review the results.   Testing/Procedures: None ordered   Follow-Up: At [facility], you and your health needs are our priority.  As part of our continuing mission to provide you with exceptional heart care, we have created designated Provider Care Teams.  These Care Teams include your primary Cardiologist (physician) and Advanced Practice Providers (APPs -  Physician Assistants and Nurse Practitioners) who all work together to provide you with the care you need, when you need it.  We recommend signing up for the patient portal called "MyChart".  Sign up information is provided on this After Visit Summary.  MyChart is used to connect with patients for Virtual Visits (Telemedicine).  Patients are able to view lab/test results, encounter notes, upcoming appointments, etc.  Non-urgent messages can be sent to your provider as well.   To learn more about what you can do with MyChart, go to [email or web address].    Your next appointment:   1 month(s)  The format for your next appointment:   In Person  Provider:   You may see Ida Rogue, MD or one of the following Advanced Practice Providers on your designated Care Team:     Murray Hodgkins, NP  Christell Faith, PA-C  Marrianne Mood, PA-C  Cadence [place], [place]  Laurann Montana, NP    Other Instructions

## 2021-02-19 ENCOUNTER — Telehealth: Payer: Self-pay | Admitting: Gastroenterology

## 2021-02-19 ENCOUNTER — Telehealth: Payer: Self-pay | Admitting: Cardiovascular Disease

## 2021-02-19 NOTE — Telephone Encounter (Signed)
Was able to return Mrs. Broadus John call (okay by DPR to speak with wife). Reports pt's BP is 94/47 and 100/49. Advised that is improvement from last week of readings in the 70s-80s. Pt saw Dr. Garen Lah and he advised "Hypotension, BP improved but still on the low side.  Agree with reducing doxazosin.  Monitor blood pressures closely.  Will stop losartan for additional BP benefit." Mrs. Swamy reports pt has been off the losartan and BP has been slightly improving. Advised that it may take several days for the losartan and doxazosin to completely get out of his system. Encourage movement and hydration at this time since pt has been sick and feeling exhausted. Mrs. Hasuer reports pt has been feeling much better, has not felt dizzy or as week since last visit. Mrs. Westrup will try to get pt to drink for hydration and try to get him up moving when he feels up to it. Advised to monitor BP closely, if BP runs lower 90s or below then to call back for medication management and if pt develops symptoms such as increase weakness, shob, CP, lightheadedness, or dizziness then to call the clinic for possible appt or seek ED for further evaluation. Mrs. Mussell voiced understanding, grateful for call back, otherwise all questions or concerns were address and no additional concerns at this time. Agreeable to plan, will call back for anything further.

## 2021-02-19 NOTE — Telephone Encounter (Signed)
-----   Message from [name], MD sent at 02/15/2021  1:33 PM EST ----- Let the patient know the CT scan did not show diverticulitis

## 2021-02-19 NOTE — Telephone Encounter (Signed)
Pt's wife notified of results. She would like to know if they should research and check to see if he has intestinal/mesenteric ischemia?

## 2021-02-19 NOTE — Telephone Encounter (Signed)
Patient spouse calling  States we recently took him off losartan medication  Today BP is 94/47 Would like to know what BP readings they are hoping to see and what would be cause for concern Please call to discuss

## 2021-02-19 NOTE — Telephone Encounter (Signed)
Patient's wife called asking for results from CT scan, please call to advise

## 2021-02-19 NOTE — Telephone Encounter (Signed)
If he had intestinal ischemia he would have any bowel movements and diarrhea.  This would also be accompanied by pain which I believe he is having.  The treatment of intestinal ischemia is giving player fluids and waiting for to heal.  There is no specific workup for this intestinal ischemia.

## 2021-02-20 ENCOUNTER — Telehealth: Payer: Self-pay

## 2021-02-20 NOTE — Telephone Encounter (Signed)
Patient's wife left VM that patient has been experiencing abdominal pain, N/V & diarrhea. He had a recent CT scan that was unremarkable and did not explain the stomach pain. Will schedule recall appt with [facility] for previous issues if patient would like. Would recommend a PCP referral to GI for stomach issues.

## 2021-02-20 NOTE — Telephone Encounter (Signed)
Pt's wife notified of recommendation.

## 2021-02-21 ENCOUNTER — Other Ambulatory Visit: Payer: Self-pay | Admitting: *Deleted

## 2021-02-21 DIAGNOSIS — I6523 Occlusion and stenosis of bilateral carotid arteries: Secondary | ICD-10-CM

## 2021-02-22 ENCOUNTER — Other Ambulatory Visit: Payer: Self-pay | Admitting: *Deleted

## 2021-02-22 ENCOUNTER — Ambulatory Visit: Payer: PPO | Admitting: Physician Assistant

## 2021-02-22 ENCOUNTER — Other Ambulatory Visit: Payer: Self-pay

## 2021-02-22 ENCOUNTER — Ambulatory Visit (HOSPITAL_COMMUNITY)
Admission: RE | Admit: 2021-02-22 | Discharge: 2021-02-22 | Disposition: A | Discharge status: Home / Self Care | Payer: PPO | Source: Ambulatory Visit | Attending: Vascular Surgery | Admitting: Vascular Surgery

## 2021-02-22 VITALS — BP 138/57 | HR 67 | Temp 98.3°F | Resp 20 | Ht 69.0 in | Wt 183.4 lb

## 2021-02-22 DIAGNOSIS — I6521 Occlusion and stenosis of right carotid artery: Secondary | ICD-10-CM | POA: Diagnosis not present

## 2021-02-22 DIAGNOSIS — R109 Unspecified abdominal pain: Secondary | ICD-10-CM

## 2021-02-22 DIAGNOSIS — I6523 Occlusion and stenosis of bilateral carotid arteries: Secondary | ICD-10-CM | POA: Diagnosis not present

## 2021-02-22 DIAGNOSIS — I701 Atherosclerosis of renal artery: Secondary | ICD-10-CM

## 2021-02-22 DIAGNOSIS — I739 Peripheral vascular disease, unspecified: Secondary | ICD-10-CM | POA: Diagnosis not present

## 2021-02-22 DIAGNOSIS — I6522 Occlusion and stenosis of left carotid artery: Secondary | ICD-10-CM | POA: Diagnosis not present

## 2021-02-22 NOTE — Progress Notes (Addendum)
HISTORY AND PHYSICAL     CC:  follow up. Requesting Provider:  Trinna Post, PA-C  HPI: This is a 80 y.o. male here for follow up for carotid artery stenosis.  Pt is s/p right CEA in 1993.  He also has hx of left renal artery stenting as well as left CIA and EIA stenting in 2012 by Dr. Trula Slade. He underwent right renal artery angioplasty and stenting by Dr. Donnetta Hutching in 2015.    Pt was last seen in 2019 by Dr. Donnetta Hutching and at that time he was seen for lower extremity PAD.  He had decreased ABI but no rest pain or tissue loss.  He did have neuropathy.    Pt returns today for follow up.    Pt denies any amaurosis fugax, speech difficulties, weakness, numbness, paralysis or clumsiness or facial droop.  He states that he is not having any rest pain or non healing wounds of his feet.  He does get some cramping in his legs when he walks but this is unchanged.  He state he gets a corn on the bottom of the right foot that the podiatrist keeps an eye on.  He has had abdominal pain recently.  He has also had some vomiting after meals.  He has lost 5lbs without trying.  He has started eating smaller meals and this has helped.  He does get some abdominal pain after eating lunch and dinner but not breakfast but this does not happen every day.  He had a CT scan last week that was unremarkable for any cause.  He denies any blood in his stool.    He did see the nephrologist earlier this week regarding renal artery cyst.  His wife states that he had blood work done and his renal function was normal.   Pt has PAF and on Xarelto, CAD with hx of CABG.  He saw cardiology last week as he had been seen by nephrology and had decreased BP with systolic in the 02'I.  His medications were adjusted.    He was recently having abdominal pain and underwent CT scan, which did not reveal any source for his pain.  He was diagnosed with IBS.    The pt is on a statin for cholesterol management.  The pt is not on a daily aspirin.    Other AC:  Plavix/Xarelto The pt is on BB for hypertension/PAF.   The pt is not diabetic.   Tobacco hx:  former  Pt does not have family hx of AAA.  Past Medical History:  Diagnosis Date  . Cancer ([place])    basel cell carcinoma left ear  . Carotid artery occlusion    s/p right carotid endarterectomy  . Chronic kidney disease    renal artery stenosis  . Coronary artery disease    s/p coronary bypass graft surgery in 1992, stable  . GERD (gastroesophageal reflux disease)   . Hyperlipidemia   . Hypertension   . Myocardial infarction ([place])   . PVD (peripheral vascular disease) ([place])    s/p multiple revascularization procedures.  . Shortness of breath   . Sleep apnea     Past Surgical History:  Procedure Laterality Date  . abdomial angiogram  03-16-2014  . ABDOMINAL ANGIOGRAM N/A 03/16/2014   Procedure: ABDOMINAL ANGIOGRAM;  Surgeon: Rosetta Posner, MD;  Location: [facility] CATH LAB;  Service: Cardiovascular;  Laterality: N/A;  . ABDOMINAL AORTAGRAM N/A 12/03/2011   Procedure: ABDOMINAL AORTAGRAM;  Surgeon: Serafina Mitchell, MD;  Location: [facility] CATH LAB;  Service: Cardiovascular;  Laterality: N/A;  . CAROTID ENDARTERECTOMY     Right. With 46% stenosis in the right by recent Doppler  . COLONOSCOPY WITH PROPOFOL N/A 03/12/2016   Procedure: COLONOSCOPY WITH PROPOFOL;  Surgeon: [name], MD;  Location: [facility] ENDOSCOPY;  Service: Endoscopy;  Laterality: N/A;  . CORONARY ARTERY BYPASS GRAFT  1992  . CYST REMOVAL HAND     right index knuckle  . ESOPHAGOGASTRODUODENOSCOPY (EGD) WITH PROPOFOL N/A 07/13/2019   Procedure: ESOPHAGOGASTRODUODENOSCOPY (EGD) WITH PROPOFOL;  Surgeon: [name], MD;  Location[facility] ENDOSCOPY;  Service: Endoscopy;  Laterality: N/A;  . left common iliac  12/03/2011  . LEFT HEART CATH AND CORS/GRAFTS ANGIOGRAPHY Left 12/08/2018   Procedure: LEFT HEART CATH AND CORS/GRAFTS ANGIOGRAPHY;  Surgeon: Nelva Bush, MD;  Location: [place] CV LAB;  Service: Cardiovascular;   Laterality: Left;  . MOHS SURGERY    . Peripheral vascular revascularization     . RENAL ANGIOGRAM  03-16-2014  . RENAL ANGIOGRAM Left 12/03/2011   Procedure: RENAL ANGIOGRAM;  Surgeon: Serafina Mitchell, MD;  Location[facility] CATH LAB;  Service: Cardiovascular;  Laterality: Left;  lt renal artery stent  . RENAL ANGIOGRAM N/A 03/16/2014   Procedure: RENAL ANGIOGRAM;  Surgeon: Rosetta Posner, MD;  Location: [facility] CATH LAB;  Service: Cardiovascular;  Laterality: N/A;  . renal artery stenosis  12/03/2011  . SHOULDER ARTHROSCOPY     Right Amnioplasty; Acromioclavicular joint resection and arthroscopic debridement  . Surgery for Sleep Apnea      Allergies  Allergen Reactions  . Hydralazine Rash  . Clonidine Derivatives Other (See Comments)    Caused blindness in left eye for the days on the drug  . Adhesive [Tape]     SKIN SENSITIVITY  . Isosorbide     Other reaction(s): Other (See Comments)  . Latex Other (See Comments)  . Trazodone Other (See Comments)    Cognitive impairment   . Benazepril Other (See Comments)    Nausea, vomiting and diarrhea  . Carvedilol Other (See Comments)    Blurred vision  . Hydrazine Yellow [Tartrazine] Itching and Rash  . Isosorbide Dinitrate Rash  . Isosorbide Mononitrate Rash  . Lisinopril Other (See Comments)    Blurred vision   . Losartan Other (See Comments)    Blurry vision if doses>25mg . Only use it for his BP is >170    Current Outpatient Medications  Medication Sig Dispense Refill  . ARIPiprazole (ABILIFY) 2 MG tablet Take 2 mg by mouth daily.    Marland Kitchen atorvastatin (LIPITOR) 40 MG tablet TAKE 1 TABLET BY MOUTH DAILY AT 6PM 90 tablet 3  . clopidogrel (PLAVIX) 75 MG tablet TAKE ONE TABLET BY MOUTH EVERY DAY 90 tablet 3  . Coenzyme Q10 (CO Q-10) 100 MG CAPS Take 100 mg by mouth daily.    Marland Kitchen desipramine (NORPRAMIN) 25 MG tablet Take 1 tablet (25 mg total) by mouth at bedtime. 30 tablet 3  . dicyclomine (BENTYL) 20 MG tablet 1 to 2 tablets daily    .  doxazosin (CARDURA) 8 MG tablet TAKE ONE TABLET TWICE DAILY 180 tablet 3  . ezetimibe (ZETIA) 10 MG tablet Take 1 tablet (10 mg total) by mouth daily. 90 tablet 3  . Garlic 9937 MG CAPS Take 7,000 mg by mouth daily.     . hyoscyamine (LEVSIN SL) 0.125 MG SL tablet Place 1 tablet (0.125 mg total) under the tongue every 4 (four) hours as needed. 60 tablet 3  .  lansoprazole (PREVACID) 15 MG capsule Take 1 capsule (15 mg total) by mouth daily at 12 noon. 30 capsule 6  . levothyroxine (SYNTHROID) 25 MCG tablet TAKE ONE TABLET BY MOUTH EVERY DAY BEFORE BREAKFAST 90 tablet 1  . losartan (COZAAR) 25 MG tablet Take 1 tablet (25 mg total) by mouth daily. As needed 90 tablet 3  . memantine (NAMENDA) 5 MG tablet Take 5 mg by mouth 2 (two) times daily.    . metoprolol tartrate (LOPRESSOR) 25 MG tablet Take 0.5 tablets (12.5 mg total) by mouth 2 (two) times daily. 90 tablet 3  . nitroGLYCERIN (NITROLINGUAL) 0.4 MG/SPRAY spray Place 1 spray under the tongue every 5 (five) minutes x 3 doses as needed for chest pain. 12 g 1  . polyethylene glycol (MIRALAX / GLYCOLAX) packet Take 17 g by mouth daily. 90 packet 3  . rivaroxaban (XARELTO) 20 MG TABS tablet TAKE 1 TABLET BY MOUTH DAILY WITH SUPPER 90 tablet 1  . Saw Palmetto 450 MG CAPS Take 450 mg by mouth daily.     No current facility-administered medications for this visit.    Family History  Problem Relation Age of Onset  . Stroke Mother   . Heart disease Other   . Stroke Sister   . Stroke Brother     Social History   Socioeconomic History  . Marital status: Married    Spouse name: Not on file  . Number of children: 3  . Years of education: Not on file  . Highest education level: Some college, no degree  Occupational History  . Occupation: retired  Tobacco Use  . Smoking status: Former Smoker    Types: Cigarettes    Quit date: 12/30/1990    Years since quitting: 30.1  . Smokeless tobacco: Former Systems developer    Quit date: 01/10/1991  . Tobacco  comment: quit smoking in 1992  Vaping Use  . Vaping Use: Never used  Substance and Sexual Activity  . Alcohol use: Yes    Alcohol/week: 0.0 - 2.0 standard drinks    Comment: maybe 1-2 a month  . Drug use: No  . Sexual activity: Yes  Other Topics Concern  . Not on file  Social History Narrative  . Not on file   Social Determinants of Health   Financial Resource Strain: Low Risk   . Difficulty of Paying Living Expenses: Not hard at all  Food Insecurity: No Food Insecurity  . Worried About Charity fundraiser in the Last Year: Never true  . Ran Out of Food in the Last Year: Never true  Transportation Needs: No Transportation Needs  . Lack of Transportation (Medical): No  . Lack of Transportation (Non-Medical): No  Physical Activity: Sufficiently Active  . Days of Exercise per Week: 6 days  . Minutes of Exercise per Session: 60 min  Stress: No Stress Concern Present  . Feeling of Stress : Not at all  Social Connections: Moderately Integrated  . Frequency of Communication with Friends and Family: More than three times a week  . Frequency of Social Gatherings with Friends and Family: Once a week  . Attends Religious Services: More than 4 times per year  . Active Member of Clubs or Organizations: No  . Attends Archivist Meetings: Never  . Marital Status: Married  Human resources officer Violence: Not At Risk  . Fear of Current or Ex-Partner: No  . Emotionally Abused: No  . Physically Abused: No  . Sexually Abused: No  REVIEW OF SYSTEMS:   [X]  denotes positive finding, [ ]  denotes negative finding Cardiac  Comments:  Chest pain or chest pressure:    Shortness of breath upon exertion:    Short of breath when lying flat:    Irregular heart rhythm:        Vascular    Pain in calf, thigh, or hip brought on by ambulation:    Pain in feet at night that wakes you up from your sleep:     Blood clot in your veins:    Leg swelling:         Pulmonary    Oxygen at home:     Productive cough:     Wheezing:         Neurologic    Sudden weakness in arms or legs:     Sudden numbness in arms or legs:     Sudden onset of difficulty speaking or slurred speech:    Temporary loss of vision in one eye:     Problems with dizziness:         Gastrointestinal    Blood in stool:     Vomited blood:         Genitourinary    Burning when urinating:     Blood in urine:        Psychiatric    Major depression:         Hematologic    Bleeding problems:    Problems with blood clotting too easily:        Skin    Rashes or ulcers:        Constitutional    Fever or chills:      PHYSICAL EXAMINATION:  Today's Vitals   02/22/21 0920 02/22/21 0924  BP: (!) 121/58 (!) 138/57  Pulse: 67   Resp: 20   Temp: 98.3 F (36.8 C)   TempSrc: Temporal   SpO2: 99%   Weight: 183 lb 6.4 oz (83.2 kg)   Height: 5\' 9"  (1.753 m)    Body mass index is 27.08 kg/m.  General:  WDWN in NAD; vital signs documented above Gait: Not observed HENT: WNL, normocephalic Pulmonary: normal non-labored breathing Cardiac: regular HR Abdomen: soft, NT, no masses; aortic pulse is not palpable Skin: without rashes Vascular Exam/Pulses:  Right Left  Radial 2+ (normal) 2+ (normal)  Femoral 2+ (normal) Brisk biphasic doppler  DP monophasic Brisk monophasic  PT monophasic Brisk monophasic  Peroneal Brisk monophasic Brisk monophasic   Extremities: without ischemic changes, without Gangrene , without cellulitis; without open wounds; small corn on the plantar aspect of the lateral right foot.  Musculoskeletal: no muscle wasting or atrophy  Neurologic: A&O X 3 Psychiatric:  The pt has Normal affect.   Non-Invasive Vascular Imaging:   Carotid Duplex on 02/22/2021: Right:  40-59% ICA stenosis Left:  Occluded (known) Vertebrals: Left vertebral artery demonstrates antegrade flow. Known  right vertebral artery occlusion, possible recanalization.  Subclavians: Normal flow hemodynamics  were seen in bilateral subclavian arteries.   Previous Carotid duplex on 11/04/2017: Right: 40-59% ICA stenosis Left:   occluded% (known) Known right vertebral artery occlusion   ASSESSMENT/PLAN:: 80 y.o. male here for follow up carotid artery stenosis with hx of right CEA in 1993.  Carotid stenosis -duplex today reveals 40-59% right ICA stenosis at the high end.  He has a known left ICA occlusion.  He has remained asymptomatic.   -discussed s/s of stroke with pt and he understands should he develop any of  these sx, he will go to the nearest ER. -pt will f/u in one year with carotid duplex  PAD -pt is not having any issues with his legs and denies any non healing wounds and rest pain.  He does have some claudication but this is also unchanged.  Discussed with he and his wife to continue walking program.  Discussed that we know his ABI's are decreased.  We will repeat ABI if he starts having issues and they are in agreement with this plan.  -he has hx of left iliac stenting as well as renal artery angioplasty and stenting bilaterally.  He has not had duplex of these in quite some time.  We will bring him back in the next month for this.   Abdominal pain -pt has been having abdominal pain and has lost 5lbs that was unintentional.  He does not get pain with breakfast but with lunch and dinner and it is not every day.  He has started eating smaller meals and this has helped.  He has been put on meds for IBS and his wife states this has not helped his sx.  Given his hx of cardiovascular disease, when he returns for the renal artery and iliac artery duplexes, we will also get mesenteric u/s to evaluate.  He did have an abdominal CT scan but this was not a CTA.  -he and his wife agree with plan and we will see him back after his studies.   -pt will call sooner should they have any issues.   Leontine Locket, [facility] [phone]  Clinic MD:  Oneida Alar

## 2021-02-27 DIAGNOSIS — Z79899 Other long term (current) drug therapy: Secondary | ICD-10-CM | POA: Diagnosis not present

## 2021-02-27 DIAGNOSIS — F0150 Vascular dementia without behavioral disturbance: Secondary | ICD-10-CM | POA: Diagnosis not present

## 2021-02-27 DIAGNOSIS — R2689 Other abnormalities of gait and mobility: Secondary | ICD-10-CM | POA: Diagnosis not present

## 2021-02-27 DIAGNOSIS — I480 Paroxysmal atrial fibrillation: Secondary | ICD-10-CM | POA: Diagnosis not present

## 2021-02-27 DIAGNOSIS — G4733 Obstructive sleep apnea (adult) (pediatric): Secondary | ICD-10-CM | POA: Diagnosis not present

## 2021-02-27 DIAGNOSIS — E612 Magnesium deficiency: Secondary | ICD-10-CM | POA: Diagnosis not present

## 2021-02-27 DIAGNOSIS — G309 Alzheimer's disease, unspecified: Secondary | ICD-10-CM | POA: Diagnosis not present

## 2021-02-27 DIAGNOSIS — F0280 Dementia in other diseases classified elsewhere without behavioral disturbance: Secondary | ICD-10-CM | POA: Diagnosis not present

## 2021-03-01 ENCOUNTER — Ambulatory Visit: Payer: PPO | Admitting: Family

## 2021-03-01 ENCOUNTER — Telehealth: Payer: Self-pay | Admitting: Physician Assistant

## 2021-03-01 ENCOUNTER — Encounter: Payer: Self-pay | Admitting: Family

## 2021-03-01 ENCOUNTER — Telehealth: Payer: Self-pay | Admitting: Cardiovascular Disease

## 2021-03-01 ENCOUNTER — Other Ambulatory Visit: Payer: Self-pay

## 2021-03-01 VITALS — BP 134/56 | HR 59 | Ht 69.0 in | Wt 182.0 lb

## 2021-03-01 DIAGNOSIS — I959 Hypotension, unspecified: Secondary | ICD-10-CM | POA: Diagnosis not present

## 2021-03-01 DIAGNOSIS — D649 Anemia, unspecified: Secondary | ICD-10-CM

## 2021-03-01 DIAGNOSIS — F0280 Dementia in other diseases classified elsewhere without behavioral disturbance: Secondary | ICD-10-CM | POA: Diagnosis not present

## 2021-03-01 DIAGNOSIS — I739 Peripheral vascular disease, unspecified: Secondary | ICD-10-CM

## 2021-03-01 DIAGNOSIS — Z7901 Long term (current) use of anticoagulants: Secondary | ICD-10-CM | POA: Diagnosis not present

## 2021-03-01 DIAGNOSIS — I6523 Occlusion and stenosis of bilateral carotid arteries: Secondary | ICD-10-CM | POA: Diagnosis not present

## 2021-03-01 DIAGNOSIS — G309 Alzheimer's disease, unspecified: Secondary | ICD-10-CM | POA: Diagnosis not present

## 2021-03-01 DIAGNOSIS — I480 Paroxysmal atrial fibrillation: Secondary | ICD-10-CM

## 2021-03-01 MED ORDER — FLUDROCORTISONE ACETATE 0.1 MG PO TABS: 0.0500 mg | ORAL_TABLET | ORAL | Status: AC | PRN

## 2021-03-01 NOTE — Patient Instructions (Signed)
Medication Instructions:  Your physician has recommended you make the following change in your medication:   STOP Doxazosin (Cardura)  CHANGE Florinef  If your systolic blood pressure (the top number) in the morning is:  More than 100, do not take Florinef  Less than 100, take Florinef 0.05mg  (half tablet) daily  *If you need a refill on your cardiac medications before your next appointment, please call your pharmacy*  Lab Work: None ordered today.   Testing/Procedures: None ordered today.    Follow-Up: At [facility], you and your health needs are our priority.  As part of our continuing mission to provide you with exceptional heart care, we have created designated Provider Care Teams.  These Care Teams include your primary Cardiologist (physician) and Advanced Practice Providers (APPs -  Physician Assistants and Nurse Practitioners) who all work together to provide you with the care you need, when you need it.  We recommend signing up for the patient portal called "MyChart".  Sign up information is provided on this After Visit Summary.  MyChart is used to connect with patients for Virtual Visits (Telemedicine).  Patients are able to view lab/test results, encounter notes, upcoming appointments, etc.  Non-urgent messages can be sent to your provider as well.   To learn more about what you can do with MyChart, go to [email or web address].    Your next appointment:   March 29th as scheduled with Dr. Rockey Situ  Other Instructions  Your blood counts were low when checked by Dr. Sunday Corn. Recommend dietary sources of iron. Loel Dubonnet, NP will route a note to your primary care provider for additional recommendations.

## 2021-03-01 NOTE — Telephone Encounter (Signed)
Patient seen in clinic. See office note from 03/01/21.  Loel Dubonnet, NP

## 2021-03-01 NOTE — Telephone Encounter (Signed)
Can we call and check if he wants to make visit for anemia?

## 2021-03-01 NOTE — Progress Notes (Signed)
Office Visit    Patient Name: Clifford Aguilar Date of Encounter: 03/01/2021  PCP:  Pollak, Adriana M, [place]  Cardiologist:  Ida Rogue, MD  Advanced Practice Provider:  No care team member to display Electrophysiologist:  None   Chief Complaint    Clifford Aguilar is a 80 y.o. male with a hx of CAD s/p CABG 1992, bilateral carotid artery stenosis s/p carotid artery endarterectomy on the right, multiple vascular procedures including left iliac stent and left renal angioplasty 2012, renal artery stent 2018, celiac and superior mesenteric artery disease, atrial fibrillation on chronic anticoagulation, mixed Alzheimer's and vascular dementia, OSA, HTN, HLD presents today for labile blood pressure readings.    Past Medical History    Past Medical History:  Diagnosis Date  . Cancer ([place])    basel cell carcinoma left ear  . Carotid artery occlusion    s/p right carotid endarterectomy  . Chronic kidney disease    renal artery stenosis  . Coronary artery disease    s/p coronary bypass graft surgery in 1992, stable  . GERD (gastroesophageal reflux disease)   . Hyperlipidemia   . Hypertension   . Myocardial infarction ([place])   . PVD (peripheral vascular disease) ([place])    s/p multiple revascularization procedures.  . Shortness of breath   . Sleep apnea    Past Surgical History:  Procedure Laterality Date  . abdomial angiogram  03-16-2014  . ABDOMINAL ANGIOGRAM N/A 03/16/2014   Procedure: ABDOMINAL ANGIOGRAM;  Surgeon: Rosetta Posner, MD;  Location: [facility] CATH LAB;  Service: Cardiovascular;  Laterality: N/A;  . ABDOMINAL AORTAGRAM N/A 12/03/2011   Procedure: ABDOMINAL AORTAGRAM;  Surgeon: Serafina Mitchell, MD;  Location: [facility] CATH LAB;  Service: Cardiovascular;  Laterality: N/A;  . CAROTID ENDARTERECTOMY     Right. With 46% stenosis in the right by recent Doppler  . COLONOSCOPY WITH PROPOFOL N/A 03/12/2016   Procedure: COLONOSCOPY WITH PROPOFOL;  Surgeon:  [name], MD;  Location: [facility] ENDOSCOPY;  Service: Endoscopy;  Laterality: N/A;  . CORONARY ARTERY BYPASS GRAFT  1992  . CYST REMOVAL HAND     right index knuckle  . ESOPHAGOGASTRODUODENOSCOPY (EGD) WITH PROPOFOL N/A 07/13/2019   Procedure: ESOPHAGOGASTRODUODENOSCOPY (EGD) WITH PROPOFOL;  Surgeon: [name], MD;  Location: [facility] ENDOSCOPY;  Service: Endoscopy;  Laterality: N/A;  . left common iliac  12/03/2011  . LEFT HEART CATH AND CORS/GRAFTS ANGIOGRAPHY Left 12/08/2018   Procedure: LEFT HEART CATH AND CORS/GRAFTS ANGIOGRAPHY;  Surgeon: Nelva Bush, MD;  Location: [facility];  Service: Cardiovascular;  Laterality: Left;  . MOHS SURGERY    . Peripheral vascular revascularization     . RENAL ANGIOGRAM  03-16-2014  . RENAL ANGIOGRAM Left 12/03/2011   Procedure: RENAL ANGIOGRAM;  Surgeon: Serafina Mitchell, MD;  Location: [facility] CATH LAB;  Service: Cardiovascular;  Laterality: Left;  lt renal artery stent  . RENAL ANGIOGRAM N/A 03/16/2014   Procedure: RENAL ANGIOGRAM;  Surgeon: Rosetta Posner, MD;  Location: [facility] CATH LAB;  Service: Cardiovascular;  Laterality: N/A;  . renal artery stenosis  12/03/2011  . SHOULDER ARTHROSCOPY     Right Amnioplasty; Acromioclavicular joint resection and arthroscopic debridement  . Surgery for Sleep Apnea      Allergies  Allergies  Allergen Reactions  . Hydralazine Rash  . Clonidine Derivatives Other (See Comments)    Caused blindness in left eye for the days on the drug  . Adhesive [Tape]  SKIN SENSITIVITY  . Isosorbide     Other reaction(s): Other (See Comments)  . Latex Other (See Comments)  . Trazodone Other (See Comments)    Cognitive impairment   . Benazepril Other (See Comments)    Nausea, vomiting and diarrhea  . Carvedilol Other (See Comments)    Blurred vision  . Hydrazine Yellow [Tartrazine] Itching and Rash  . Isosorbide Dinitrate Rash  . Isosorbide Mononitrate Rash  . Lisinopril Other (See Comments)    Blurred vision    . Losartan Other (See Comments)    Blurry vision if doses>25mg . Only use it for his BP is >170    History of Present Illness    Clifford Aguilar is a 80 y.o. male with a hx of CAD s/p CABG 1992, bilateral carotid artery stenosis s/p carotid artery endarterectomy on the right, multiple vascular procedures including left iliac stent and left renal angioplasty 2012, renal artery stent 2018, celiac and superior mesenteric artery disease, atrial fibrillation on chronic anticoagulation, mixed Alzheimer's and vascular dementia, OSA, HTN, HLD. He was last seen 2/72/22 by Dr. Garen Lah.  At last clinic visit he was noted to be hypotensive.  His nephrologist had previously reduced doxazosin from twice to once daily.  He was recommended to stop his losartan and follow-up in 1 month.  His wife called the office today with concerns regarding blood pressure. He was seen by neurology 02/27/21 and started on Florinef 0.1mg  daily.  She reports blood pressure since that time has been elevated.  He is still taking doxazosin 8 mg once per day.  Reports when his blood pressure is up in the 130s to 150s as it has been since Florinef he feels better.  No lightheadedness, dizziness, near syncope.  No headache.  No chest pain, pressure, tightness.  He did have blood work Tuesday with his neurologist which showed hemoglobin of 8.6 which he was not yet aware of.  This is dramatic drop from previous hemoglobin collected 04/05/2020 of 11.1.  He denies hematuria, melena.  We discussed that this could be contributory to his low blood pressure.  Lab work via [place] 02/27/2021  Glucose 113, NA 135, K4.5, creatinine 0.8, GFR 94, AST 14, ALT 14  WBC 6.6, RBC 3.18, hemoglobin 8.6, hematocrit 27.9  EKGs/Labs/Other Studies Reviewed:   The following studies were reviewed today:  EKG:  No EKG is ordered today.    Recent Labs: 04/05/2020: ALT 19; BUN 23; Creatinine 0.9; Hemoglobin 11.1; Platelets 242; Potassium 3.1; Sodium 136;  TSH 3.42  Recent Lipid Panel    Component Value Date/Time   CHOL 131 09/11/2020 0822   CHOL 132 05/11/2019 1009   TRIG 126 09/11/2020 0822   HDL 32 (L) 09/11/2020 0822   HDL 37 (L) 05/11/2019 1009   CHOLHDL 4.1 09/11/2020 0822   VLDL 25 09/11/2020 0822   LDLCALC 74 09/11/2020 0822   LDLCALC 72 05/11/2019 1009    Home Medications   Current Meds  Medication Sig  . ARIPiprazole (ABILIFY) 2 MG tablet Take 2 mg by mouth daily.  Marland Kitchen atorvastatin (LIPITOR) 40 MG tablet TAKE 1 TABLET BY MOUTH DAILY AT 6PM  . clopidogrel (PLAVIX) 75 MG tablet TAKE ONE TABLET BY MOUTH EVERY DAY  . Coenzyme Q10 (CO Q-10) 100 MG CAPS Take 100 mg by mouth daily.  Marland Kitchen dicyclomine (BENTYL) 20 MG tablet 1 to 2 tablets daily  . ezetimibe (ZETIA) 10 MG tablet Take 1 tablet (10 mg total) by mouth daily.  . Garlic 5409 MG  CAPS Take 7,000 mg by mouth daily.   . hyoscyamine (LEVSIN SL) 0.125 MG SL tablet Place 1 tablet (0.125 mg total) under the tongue every 4 (four) hours as needed.  . lansoprazole (PREVACID) 15 MG capsule Take 1 capsule (15 mg total) by mouth daily at 12 noon.  Marland Kitchen levothyroxine (SYNTHROID) 25 MCG tablet TAKE ONE TABLET BY MOUTH EVERY DAY BEFORE BREAKFAST  . memantine (NAMENDA) 5 MG tablet Take 5 mg by mouth 2 (two) times daily.  . metoprolol tartrate (LOPRESSOR) 25 MG tablet Take 0.5 tablets (12.5 mg total) by mouth 2 (two) times daily.  . nitroGLYCERIN (NITROLINGUAL) 0.4 MG/SPRAY spray Place 1 spray under the tongue every 5 (five) minutes x 3 doses as needed for chest pain.  . polyethylene glycol (MIRALAX / GLYCOLAX) packet Take 17 g by mouth daily.  . rivaroxaban (XARELTO) 20 MG TABS tablet TAKE 1 TABLET BY MOUTH DAILY WITH SUPPER  . Saw Palmetto 450 MG CAPS Take 450 mg by mouth daily.  . [DISCONTINUED] doxazosin (CARDURA) 8 MG tablet TAKE ONE TABLET TWICE DAILY (Patient taking differently: Take 8 mg by mouth daily.)  . [DISCONTINUED] doxazosin (CARDURA) 8 MG tablet Take 8 mg by mouth daily.  .  [DISCONTINUED] fludrocortisone (FLORINEF) 0.1 MG tablet Take by mouth.     Review of Systems  All other systems reviewed and are otherwise negative except as noted above.  Physical Exam    VS:  BP (!) 134/56 (BP Location: Right Arm, Patient Position: Sitting, Cuff Size: Normal)   Pulse (!) 59   Ht 5\' 9"  (1.753 m)   Wt 182 lb (82.6 kg)   SpO2 97%   BMI 26.88 kg/m  , BMI Body mass index is 26.88 kg/m.  Wt Readings from Last 3 Encounters:  03/01/21 182 lb (82.6 kg)  02/22/21 183 lb 6.4 oz (83.2 kg)  02/15/21 192 lb (87.1 kg)    GEN: Well nourished, well developed, in no acute distress. HEENT: normal. Neck: Supple, no JVD, carotid bruits, or masses. Cardiac: RRR, no murmurs, rubs, or gallops. No clubbing, cyanosis, edema.  Radials/DP/PT 2+ and equal bilaterally.  Respiratory:  Respirations regular and unlabored, clear to auscultation bilaterally. GI: Soft, nontender, nondistended. MS: No deformity or atrophy. Skin: Warm and dry, no rash. Neuro:  Strength and sensation are intact. Psych: Normal affect.  Assessment & Plan    1. Labile HTN / Hypotension -low blood pressure for which doxazosin has previously been reduced and losartan discontinued.  We will stop doxazosin today.  He was started on Florinef by neurology earlier in the week though notes elevated blood pressure since that time.  Adjust Florinef as needed-he will check his blood pressure in the morning and if SBP less than 100 he will take Florinef 0.05 mg daily and if SBP greater than 100 he will not take Florinef.  Anticipate some of his hypotension is due to new finding of anemia.  Hemoglobin 8.6 by lab work 02/27/2021.  Denies bleeding complications.  Will route message to his primary care provider as he will likely need work-up.  2. PAD/bilateral carotid artery stenosis- Follows with vascular surgery. Mesenteric/Iliac/Renal/Aorta duplex scheduled for tomorrow. Continue statin. No aspirin secondary to chronic  anticoagulation.   3. HLD- Continue atorvastatin 40 mg daily and Zetia 10mg  daily.  4. Combined Alzheimers and vascular dementia - Follows with Neurology at [facility] Wife present to assist with history today.   5. PAF on chronic anticoagulation- Maintaining NSR by auscultation today.  Continue metoprolol tartrate 12.5  mg twice daily.  Denies palpitations.  On Xarelto 20 mg daily due to CHA2DS2-VASc of at least 3 (age, HTN, PAD).  Denies bleeding complications including angina, melena.  Anemia, as below. As he reports no bleeding complications, will continue Xarelto with careful monitoring.   6. Anemia- Labs via [facility] 04/05/2020 hemoglobin 12.1, hematocrit 33.6.  Most recent lab work 02/27/2021 with hemoglobin 8.6, hematocrit 27.9.  Significant drop in hemoglobin.  Denies hematuria, melena.  Recommend dietary intake of iron.  Will forward to PCP so they are aware as further work-up may be indicated.  Anemia likely contributory to hypotension  Disposition: Follow up 03/27/21 as scheduled with Dr. Rockey Situ.  Signed, Loel Dubonnet, NP 03/01/2021, 1:27 PM [facility]

## 2021-03-01 NOTE — Telephone Encounter (Signed)
Pt c/o BP issue: STAT if pt c/o blurred vision, one-sided weakness or slurred speech  1. What are your last 5 BP readings? 80/40 (3/1) placed on fludro-cortisone, 140/54 (3/2), 159/51 (3/3)  2. Are you having any other symptoms (ex. Dizziness, headache, blurred vision, passed out)? Not since Tuesday when it was very low. Today is states he feels well  3. What is your BP issue? After patient started medication patient had memory issues involving "driving in to town and forgetting why and how to get back"   Patient is reachable at both home [phone] and cell [phone]

## 2021-03-01 NOTE — Telephone Encounter (Signed)
Spoke with patients wife per release form and she has concerns with blood pressures being low and then being elevated. She reports BP 80/40 then they added Florinef 0.1 mg once daily. Since starting this medication BP has been 140/54 and 159/51. She is concerned with the elevated BP readings and just wants to see if they can come in to review these concerns & medications. Scheduled to come in and see our APP and she was very appreciative. No further questions at this time.

## 2021-03-02 ENCOUNTER — Ambulatory Visit (INDEPENDENT_AMBULATORY_CARE_PROVIDER_SITE_OTHER)
Admission: RE | Admit: 2021-03-02 | Discharge: 2021-03-02 | Disposition: A | Discharge status: Home / Self Care | Payer: PPO | Source: Ambulatory Visit | Attending: Physician Assistant | Admitting: Physician Assistant

## 2021-03-02 ENCOUNTER — Ambulatory Visit (HOSPITAL_COMMUNITY)
Admission: RE | Admit: 2021-03-02 | Discharge: 2021-03-02 | Disposition: A | Discharge status: Home / Self Care | Payer: PPO | Source: Ambulatory Visit | Attending: Vascular Surgery | Admitting: Vascular Surgery

## 2021-03-02 ENCOUNTER — Telehealth: Payer: Self-pay | Admitting: Family

## 2021-03-02 ENCOUNTER — Encounter: Payer: Self-pay | Admitting: Physician Assistant

## 2021-03-02 DIAGNOSIS — I701 Atherosclerosis of renal artery: Secondary | ICD-10-CM

## 2021-03-02 DIAGNOSIS — R109 Unspecified abdominal pain: Secondary | ICD-10-CM | POA: Diagnosis not present

## 2021-03-02 NOTE — Telephone Encounter (Signed)
Patient's wife calling states that Blood pressure this morning 163/63. Please call and advie if this reading is too high. States yesterday, in our office, some of patient's medication was stopped. BP right now is 123/53

## 2021-03-02 NOTE — Telephone Encounter (Signed)
Pt seen in clinic yesterday with Clifford Raring, NP for h/o labile blood pressures.  Cardura was discontinued and sliding scale instructions given for PRN Florinef:  -Take BP first thing in morning   If SBP >100 do not take Florinef   If SBP <100, take Florinef 0.05mg  daily  Wife wanted to make sure pt's BP this moring of 163/63 was not too high.  Pt's current BP is 123/53. Pt has no change in symptoms.  Advised wife to continue to log blood pressures and may call us with readings after 4-5 days to see what his numbers are doing since med changes. Or may call sooner if any change in pt's s/s.  Wife verbalized understanding and appreciative of information.

## 2021-03-05 ENCOUNTER — Ambulatory Visit: Payer: PPO | Admitting: Physician Assistant

## 2021-03-05 ENCOUNTER — Telehealth: Payer: Self-pay

## 2021-03-05 ENCOUNTER — Other Ambulatory Visit: Payer: Self-pay

## 2021-03-05 ENCOUNTER — Telehealth (INDEPENDENT_AMBULATORY_CARE_PROVIDER_SITE_OTHER): Payer: PPO | Admitting: Physician Assistant

## 2021-03-05 VITALS — BP 113/47 | HR 58 | Temp 98.3°F | Resp 20 | Ht 69.0 in | Wt 181.8 lb

## 2021-03-05 DIAGNOSIS — D649 Anemia, unspecified: Secondary | ICD-10-CM | POA: Diagnosis not present

## 2021-03-05 DIAGNOSIS — I739 Peripheral vascular disease, unspecified: Secondary | ICD-10-CM

## 2021-03-05 DIAGNOSIS — I701 Atherosclerosis of renal artery: Secondary | ICD-10-CM | POA: Diagnosis not present

## 2021-03-05 NOTE — Telephone Encounter (Signed)
Copied from [place] [id number]. Topic: General - Inquiry >> Mar 05, 2021 12:21 PM Scherrie Gerlach wrote: Reason for CRM: Clifford Aguilar said she got a call and was thinking they were going to make the pt an appt for today with Adriana. But the call was dropped.

## 2021-03-05 NOTE — Telephone Encounter (Signed)
Patient had a Mining engineer visit.

## 2021-03-05 NOTE — Telephone Encounter (Signed)
Message via Deloris Ping was send

## 2021-03-05 NOTE — Progress Notes (Signed)
Office Note     CC:  follow up Requesting Provider:  Trinna Post, PA-C  HPI: Clifford Aguilar is a 80 y.o. ([birth year]) male who presents to discuss recent noninvasive studies. He underwent renal artery duplex and mesenteric duplex. History is significant for left renal artery stenting in 2012 and right renal artery angioplasty and stenting in 2015.  He is also status post left common iliac artery and external iliac artery stenting in 2012.  His last visit on 02/22/2021 he states he had followed up with his nephrologist and renal function was normal. In addition, antihypertensives were adjusted. His blood pressure was running on the low side. He has some degree of lower extremity pain with exercise, but this has been present for years and is not limting or worsening. He is now eating multiple small meals a day with improvement in abdominal pain and vomiting.  The pt is on a statin for cholesterol management.  The pt is not on a daily aspirin.   Other AC:  Plavix/Xarelto The pt is on BB for hypertension/PAF.   The pt is not diabetic.   Tobacco hx:  former   Past Medical History:  Diagnosis Date  . Cancer ([place])    basel cell carcinoma left ear  . Carotid artery occlusion    s/p right carotid endarterectomy  . Chronic kidney disease    renal artery stenosis  . Coronary artery disease    s/p coronary bypass graft surgery in 1992, stable  . GERD (gastroesophageal reflux disease)   . Hyperlipidemia   . Hypertension   . Myocardial infarction ([place])   . PVD (peripheral vascular disease) ([place])    s/p multiple revascularization procedures.  . Shortness of breath   . Sleep apnea     Past Surgical History:  Procedure Laterality Date  . abdomial angiogram  03-16-2014  . ABDOMINAL ANGIOGRAM N/A 03/16/2014   Procedure: ABDOMINAL ANGIOGRAM;  Surgeon: Rosetta Posner, MD;  Location: [facility] CATH LAB;  Service: Cardiovascular;  Laterality: N/A;  . ABDOMINAL AORTAGRAM N/A 12/03/2011   Procedure:  ABDOMINAL AORTAGRAM;  Surgeon: Serafina Mitchell, MD;  Location: [facility] CATH LAB;  Service: Cardiovascular;  Laterality: N/A;  . CAROTID ENDARTERECTOMY     Right. With 46% stenosis in the right by recent Doppler  . COLONOSCOPY WITH PROPOFOL N/A 03/12/2016   Procedure: COLONOSCOPY WITH PROPOFOL;  Surgeon: [name], MD;  Location: [facility] ENDOSCOPY;  Service: Endoscopy;  Laterality: N/A;  . CORONARY ARTERY BYPASS GRAFT  1992  . CYST REMOVAL HAND     right index knuckle  . ESOPHAGOGASTRODUODENOSCOPY (EGD) WITH PROPOFOL N/A 07/13/2019   Procedure: ESOPHAGOGASTRODUODENOSCOPY (EGD) WITH PROPOFOL;  Surgeon: [name], MD;  Location: [facility] ENDOSCOPY;  Service: Endoscopy;  Laterality: N/A;  . left common iliac  12/03/2011  . LEFT HEART CATH AND CORS/GRAFTS ANGIOGRAPHY Left 12/08/2018   Procedure: LEFT HEART CATH AND CORS/GRAFTS ANGIOGRAPHY;  Surgeon: Nelva Bush, MD;  Location: [facility] CV LAB;  Service: Cardiovascular;  Laterality: Left;  . MOHS SURGERY    . Peripheral vascular revascularization     . RENAL ANGIOGRAM  03-16-2014  . RENAL ANGIOGRAM Left 12/03/2011   Procedure: RENAL ANGIOGRAM;  Surgeon: Serafina Mitchell, MD;  Location: [facility] CATH LAB;  Service: Cardiovascular;  Laterality: Left;  lt renal artery stent  . RENAL ANGIOGRAM N/A 03/16/2014   Procedure: RENAL ANGIOGRAM;  Surgeon: Rosetta Posner, MD;  Location: [facility] CATH LAB;  Service: Cardiovascular;  Laterality: N/A;  . renal artery stenosis  12/03/2011  . SHOULDER ARTHROSCOPY     Right Amnioplasty; Acromioclavicular joint resection and arthroscopic debridement  . Surgery for Sleep Apnea      Social History   Socioeconomic History  . Marital status: Married    Spouse name: Not on file  . Number of children: 3  . Years of education: Not on file  . Highest education level: Some college, no degree  Occupational History  . Occupation: retired  Tobacco Use  . Smoking status: Former Smoker    Types: Cigarettes    Quit date: 12/30/1990     Years since quitting: 30.2  . Smokeless tobacco: Former Systems developer    Quit date: 01/10/1991  . Tobacco comment: quit smoking in 1992  Vaping Use  . Vaping Use: Never used  Substance and Sexual Activity  . Alcohol use: Yes    Alcohol/week: 0.0 - 2.0 standard drinks    Comment: maybe 1-2 a month  . Drug use: No  . Sexual activity: Yes  Other Topics Concern  . Not on file  Social History Narrative  . Not on file   Social Determinants of Health   Financial Resource Strain: Low Risk   . Difficulty of Paying Living Expenses: Not hard at all  Food Insecurity: No Food Insecurity  . Worried About Charity fundraiser in the Last Year: Never true  . Ran Out of Food in the Last Year: Never true  Transportation Needs: No Transportation Needs  . Lack of Transportation (Medical): No  . Lack of Transportation (Non-Medical): No  Physical Activity: Sufficiently Active  . Days of Exercise per Week: 6 days  . Minutes of Exercise per Session: 60 min  Stress: No Stress Concern Present  . Feeling of Stress : Not at all  Social Connections: Moderately Integrated  . Frequency of Communication with Friends and Family: More than three times a week  . Frequency of Social Gatherings with Friends and Family: Once a week  . Attends Religious Services: More than 4 times per year  . Active Member of Clubs or Organizations: No  . Attends Archivist Meetings: Never  . Marital Status: Married  Human resources officer Violence: Not At Risk  . Fear of Current or Ex-Partner: No  . Emotionally Abused: No  . Physically Abused: No  . Sexually Abused: No   Family History  Problem Relation Age of Onset  . Stroke Mother   . Heart disease Other   . Stroke Sister   . Stroke Brother     Current Outpatient Medications  Medication Sig Dispense Refill  . ARIPiprazole (ABILIFY) 2 MG tablet Take 2 mg by mouth daily.    Marland Kitchen atorvastatin (LIPITOR) 40 MG tablet TAKE 1 TABLET BY MOUTH DAILY AT 6PM 90 tablet 3  .  clopidogrel (PLAVIX) 75 MG tablet TAKE ONE TABLET BY MOUTH EVERY DAY 90 tablet 3  . Coenzyme Q10 (CO Q-10) 100 MG CAPS Take 100 mg by mouth daily.    Marland Kitchen dicyclomine (BENTYL) 20 MG tablet 1 to 2 tablets daily    . ezetimibe (ZETIA) 10 MG tablet Take 1 tablet (10 mg total) by mouth daily. 90 tablet 3  . fludrocortisone (FLORINEF) 0.1 MG tablet Take 0.5 tablets (0.05 mg total) by mouth as needed. For systolic BP (top number) less than 100.    . Garlic 3710 MG CAPS Take 7,000 mg by mouth daily.     . hyoscyamine (LEVSIN SL) 0.125 MG SL tablet Place 1 tablet (0.125 mg total)  under the tongue every 4 (four) hours as needed. 60 tablet 3  . lansoprazole (PREVACID) 15 MG capsule Take 1 capsule (15 mg total) by mouth daily at 12 noon. 30 capsule 6  . levothyroxine (SYNTHROID) 25 MCG tablet TAKE ONE TABLET BY MOUTH EVERY DAY BEFORE BREAKFAST 90 tablet 1  . memantine (NAMENDA) 5 MG tablet Take 5 mg by mouth 2 (two) times daily.    . metoprolol tartrate (LOPRESSOR) 25 MG tablet Take 0.5 tablets (12.5 mg total) by mouth 2 (two) times daily. 90 tablet 3  . nitroGLYCERIN (NITROLINGUAL) 0.4 MG/SPRAY spray Place 1 spray under the tongue every 5 (five) minutes x 3 doses as needed for chest pain. 12 g 1  . polyethylene glycol (MIRALAX / GLYCOLAX) packet Take 17 g by mouth daily. 90 packet 3  . rivaroxaban (XARELTO) 20 MG TABS tablet TAKE 1 TABLET BY MOUTH DAILY WITH SUPPER 90 tablet 1  . Saw Palmetto 450 MG CAPS Take 450 mg by mouth daily.     No current facility-administered medications for this visit.    Allergies  Allergen Reactions  . Hydralazine Rash  . Clonidine Derivatives Other (See Comments)    Caused blindness in left eye for the days on the drug  . Adhesive [Tape]     SKIN SENSITIVITY  . Isosorbide     Other reaction(s): Other (See Comments)  . Latex Other (See Comments)  . Trazodone Other (See Comments)    Cognitive impairment   . Benazepril Other (See Comments)    Nausea, vomiting and  diarrhea  . Carvedilol Other (See Comments)    Blurred vision  . Hydrazine Yellow [Tartrazine] Itching and Rash  . Isosorbide Dinitrate Rash  . Isosorbide Mononitrate Rash  . Lisinopril Other (See Comments)    Blurred vision   . Losartan Other (See Comments)    Blurry vision if doses>25mg . Only use it for his BP is >170     REVIEW OF SYSTEMS:   [X]  denotes positive finding, [ ]  denotes negative finding Cardiac  Comments:  Chest pain or chest pressure:    Shortness of breath upon exertion:    Short of breath when lying flat:    Irregular heart rhythm:        Vascular    Pain in calf, thigh, or hip brought on by ambulation: x   Pain in feet at night that wakes you up from your sleep:     Blood clot in your veins:    Leg swelling:         Pulmonary    Oxygen at home:    Productive cough:     Wheezing:         Neurologic    Sudden weakness in arms or legs:     Sudden numbness in arms or legs:     Sudden onset of difficulty speaking or slurred speech:    Temporary loss of vision in one eye:     Problems with dizziness:         Gastrointestinal    Blood in stool:     Vomited blood:         Genitourinary    Burning when urinating:     Blood in urine:        Psychiatric    Major depression:         Hematologic    Bleeding problems:    Problems with blood clotting too easily:        Skin  Rashes or ulcers:        Constitutional    Fever or chills:      PHYSICAL EXAMINATION:  Vitals:   03/05/21 0910  BP: (!) 113/47  Pulse: (!) 58  Resp: 20  Temp: 98.3 F (36.8 C)  SpO2: 99%    General:  WDWN in NAD; vital signs documented above Gait: unaided; no ataxia HENT: WNL, normocephalic Pulmonary: normal non-labored breathing , without Rales, rhonchi,  wheezing Cardiac: regular HR, without  Murmurs  Abdomen: soft, NT, no masses Skin: with rashes>>pigment changes bil anterior lower legs Vascular Exam/Pulses: 2+ bil femoral pulses. Weakly palpable left AT  pulse Extremities: without ischemic changes, without Gangrene , without cellulitis; without open wounds;  Musculoskeletal: no muscle wasting or atrophy  Neurologic: A&O X 3;  No focal weakness or paresthesias are detected Psychiatric:  The pt has Normal affect.   Non-Invasive Vascular Imaging:   03/02/2021 Mesenteric:  70 to 99% stenosis in the celiac artery origin (per peak systolic velocity).  Areas of limited visceral study include mesenteric arteries. No evidence  of stenosis in the mesenteric artery.    Renal: Diagnosing physician: Jamelle Haring Electronically signed by Jamelle Haring on 03/02/2021 at 4:44:31 PM. Right: Evidence of a greater than 60% stenosis of the right renal artery. Normal size right kidney. RRV flow present. Cyst(s) noted. Left: Evidence of a > 60% stenosis in the left renal artery. Normal size of left kidney. LRV flow present. Cyst(s) noted.  Summary: Abdominal Aorta: No evidence of an abdominal aortic aneurysm was visualized. >50% Stenosis seen in the distal external iliacs bilaterally. Left iliac stents were not clearly visualized due to bowel gas and acoustic shadowing from calcified plaque. Stenosis: Location Stenosis Right External Iliac >50% stenosis Left External Iliac >50% stenosis   ASSESSMENT/PLAN:: 80 y.o. male here for follow up for PAD and celiac artery stenosis. He does not have abdominal symptoms referable to mesenteric ischemia. LE pain is non-limiting.  Discussed non-invasive studies with Dr. Donzetta Matters. No indication for intervention at this time.  Continue maximal medical management and close follow-up with PCP/nephrology/cardiology. Return in one year with carotid, renal, mesenteric and aorto-iliac duplex studies.  Barbie Banner, PA-C [facility] [phone]  Clinic MD:   Donzetta Matters

## 2021-03-05 NOTE — Progress Notes (Signed)
MyChart Video Visit    Virtual Visit via Video Note   This visit type was conducted due to national recommendations for restrictions regarding the COVID-19 Pandemic (e.g. social distancing) in an effort to limit this patient's exposure and mitigate transmission in our community. This patient is at least at moderate risk for complications without adequate follow up. This format is felt to be most appropriate for this patient at this time. Physical exam was limited by quality of the video and audio technology used for the visit.   Patient location: Home Provider location: Office   I discussed the limitations of evaluation and management by telemedicine and the availability of in person appointments. The patient expressed understanding and agreed to proceed.  Patient: Clifford Aguilar   DOB: 12/14/1941   80 y.o. Male  MRN: [id number] Visit Date: 03/05/2021  Today's healthcare provider: Trinna Post, PA-C   Chief Complaint  Patient presents with  . Anemia   Subjective    HPI   Patient with history of CAD, PAD, and paroxysmal a-fib on Eliquis and Plavix presenting today for anemia. His hemoglobin in 03/2020 was 11.1. On 02/27/2021 his hemoglobin was 8.6. Patient's wife Humberto Seals reports that he has been having hypotensive episodes and had his doxazosin discontinued. He was started on florinef which has been some what helpful but has not brought about a consistent resolution. No hematemesis or gross blood in stool.   Lab Results  Component Value Date   WBC 7.3 03/05/2021   HGB 8.6 (L) 03/05/2021   HCT 26.9 (L) 03/05/2021   MCV 86 03/05/2021   PLT 265 03/05/2021    Lab Results  Component Value Date   IRON 15 (L) 03/05/2021   TIBC 319 03/05/2021   FERRITIN 17 (L) 03/05/2021     Medications: Outpatient Medications Prior to Visit  Medication Sig  . ARIPiprazole (ABILIFY) 2 MG tablet Take 2 mg by mouth daily.  Marland Kitchen atorvastatin (LIPITOR) 40 MG tablet TAKE 1 TABLET BY MOUTH DAILY AT  6PM  . clopidogrel (PLAVIX) 75 MG tablet TAKE ONE TABLET BY MOUTH EVERY DAY  . Coenzyme Q10 (CO Q-10) 100 MG CAPS Take 100 mg by mouth daily.  Marland Kitchen dicyclomine (BENTYL) 20 MG tablet 1 to 2 tablets daily  . ezetimibe (ZETIA) 10 MG tablet Take 1 tablet (10 mg total) by mouth daily.  . fludrocortisone (FLORINEF) 0.1 MG tablet Take 0.5 tablets (0.05 mg total) by mouth as needed. For systolic BP (top number) less than 100.  . Garlic 5427 MG CAPS Take 7,000 mg by mouth daily.   . hyoscyamine (LEVSIN SL) 0.125 MG SL tablet Place 1 tablet (0.125 mg total) under the tongue every 4 (four) hours as needed.  . lansoprazole (PREVACID) 15 MG capsule Take 1 capsule (15 mg total) by mouth daily at 12 noon.  Marland Kitchen levothyroxine (SYNTHROID) 25 MCG tablet TAKE ONE TABLET BY MOUTH EVERY DAY BEFORE BREAKFAST  . memantine (NAMENDA) 5 MG tablet Take 5 mg by mouth 2 (two) times daily.  . metoprolol tartrate (LOPRESSOR) 25 MG tablet Take 0.5 tablets (12.5 mg total) by mouth 2 (two) times daily.  . nitroGLYCERIN (NITROLINGUAL) 0.4 MG/SPRAY spray Place 1 spray under the tongue every 5 (five) minutes x 3 doses as needed for chest pain.  . polyethylene glycol (MIRALAX / GLYCOLAX) packet Take 17 g by mouth daily.  . rivaroxaban (XARELTO) 20 MG TABS tablet TAKE 1 TABLET BY MOUTH DAILY WITH SUPPER  . Saw Palmetto 450 MG CAPS  Take 450 mg by mouth daily.   No facility-administered medications prior to visit.    Review of Systems    Objective    There were no vitals taken for this visit.   Physical Exam Constitutional:      Appearance: Normal appearance.  Pulmonary:     Effort: Pulmonary effort is normal. No respiratory distress.  Neurological:     Mental Status: He is alert.  Psychiatric:        Mood and Affect: Mood normal.        Behavior: Behavior normal.        Assessment & Plan    1. Anemia, unspecified type  Concerned for GI bleed. Repeat STAT CBC showed hemoglobin stable at 8.6. Showed low iron. CMET  looked stable. Checked with his cardiologist, OK to keep Eliquis for now. Reached out to his GI specialist Dr. Allen Norris and he has an appointment with him on 03/08/2021. Advised to take ferrous sulfate 325 mg TID. Advised on emergent return precautions. Left hemoccult card up front to complete as well.   - CBC with Differential - Fe+TIBC+Fer - B12 and Folate Panel - Comprehensive Metabolic Panel (CMET)   No follow-ups on file.     I discussed the assessment and treatment plan with the patient. The patient was provided an opportunity to ask questions and all were answered. The patient agreed with the plan and demonstrated an understanding of the instructions.   The patient was advised to call back or seek an in-person evaluation if the symptoms worsen or if the condition fails to improve as anticipated.   ITrinna Post, PA-C, have reviewed all documentation for this visit. The documentation on 03/07/21 for the exam, diagnosis, procedures, and orders are all accurate and complete.  The entirety of the information documented in the History of Present Illness, Review of Systems and Physical Exam were personally obtained by me. Portions of this information were initially documented by [facility] and reviewed by me for thoroughness and accuracy.   I spent 30 minutes dedicated to the care of this patient on the date of this encounter to include pre-visit review of records, face-to-face time with the patient discussing anemia, GI bleed, and post visit ordering of testing.   Paulene Floor [facility] [phone] (phone) [phone] (fax)  [place]

## 2021-03-05 NOTE — Telephone Encounter (Signed)
Virtual or telephone visit please.

## 2021-03-06 LAB — COMPREHENSIVE METABOLIC PANEL
ALT: 12 IU/L (ref 0–44)
AST: 17 IU/L (ref 0–40)
Albumin/Globulin Ratio: 1.7 (ref 1.2–2.2)
Albumin: 3.8 g/dL (ref 3.7–4.7)
Alkaline Phosphatase: 93 IU/L (ref 44–121)
BUN/Creatinine Ratio: 18 (ref 10–24)
BUN: 14 mg/dL (ref 8–27)
Bilirubin Total: 0.3 mg/dL (ref 0.0–1.2)
CO2: 20 mmol/L (ref 20–29)
Calcium: 8.8 mg/dL (ref 8.6–10.2)
Chloride: 105 mmol/L (ref 96–106)
Creatinine, Ser: 0.8 mg/dL (ref 0.76–1.27)
Globulin, Total: 2.2 g/dL (ref 1.5–4.5)
Glucose: 137 mg/dL — ABNORMAL HIGH (ref 65–99)
Potassium: 4.6 mmol/L (ref 3.5–5.2)
Sodium: 138 mmol/L (ref 134–144)
Total Protein: 6 g/dL (ref 6.0–8.5)
eGFR: 91 mL/min/{1.73_m2} (ref >59)

## 2021-03-06 LAB — CBC WITH DIFFERENTIAL/PLATELET
Basophils Absolute: 0.1 10*3/uL (ref 0.0–0.2)
Basos: 1 %
EOS (ABSOLUTE): 0.1 10*3/uL (ref 0.0–0.4)
Eos: 2 %
Hematocrit: 26.9 % — ABNORMAL LOW (ref 37.5–51.0)
Hemoglobin: 8.6 g/dL — ABNORMAL LOW (ref 13.0–17.7)
Immature Grans (Abs): 0 10*3/uL (ref 0.0–0.1)
Immature Granulocytes: 0 %
Lymphocytes Absolute: 1.7 10*3/uL (ref 0.7–3.1)
Lymphs: 23 %
MCH: 27.5 pg (ref 26.6–33.0)
MCHC: 32 g/dL (ref 31.5–35.7)
MCV: 86 fL (ref 79–97)
Monocytes Absolute: 0.8 10*3/uL (ref 0.1–0.9)
Monocytes: 11 %
Neutrophils Absolute: 4.6 10*3/uL (ref 1.4–7.0)
Neutrophils: 63 %
Platelets: 265 10*3/uL (ref 150–450)
RBC: 3.13 x10E6/uL — ABNORMAL LOW (ref 4.14–5.80)
RDW: 14.1 % (ref 11.6–15.4)
WBC: 7.3 10*3/uL (ref 3.4–10.8)

## 2021-03-06 LAB — IRON,TIBC AND FERRITIN PANEL
Ferritin: 17 ng/mL — ABNORMAL LOW (ref 30–400)
Iron Saturation: 5 % — CL (ref 15–55)
Iron: 15 ug/dL — ABNORMAL LOW (ref 38–169)
Total Iron Binding Capacity: 319 ug/dL (ref 250–450)
UIBC: 304 ug/dL (ref 111–343)

## 2021-03-06 LAB — B12 AND FOLATE PANEL
Folate: 15.1 ng/mL (ref >3.0)
Vitamin B-12: 480 pg/mL (ref 232–1245)

## 2021-03-06 NOTE — Telephone Encounter (Signed)
Thanks for sending! BP readings look overall good. Is he having any recurrent lightheadedness or dizziness? At this time, I would recommend continuing current medications. Glad he is being seen by GI, agree with that recommendation by PCP.   Loel Dubonnet, NP

## 2021-03-06 NOTE — Telephone Encounter (Signed)
Clifford Aguilar, forwarding you pt's BP readings after med changes last week.

## 2021-03-06 NOTE — Telephone Encounter (Signed)
Wife calling in with BP readings    Am  2 hours later 3/5  92/86  107/48 (gave 1/2 florinef) 3/6  95/47  124/50 (gave 1/2 florinef) 3/7  122/63  102/53(5 hr later)    ** 6 pm 97/53(gave 1/2 florinef) 3/8  140/60  161/58   PCP also is having pt being seen by GI doctor due to hemoglobin results

## 2021-03-06 NOTE — Telephone Encounter (Signed)
Spoke to pt and wife, pt reports that he has not had any recurrent lightheadedness or dizziness.  They verbalize understanding to continue current medications. No further needs at this time.

## 2021-03-07 NOTE — Patient Instructions (Signed)
[employer] medicine (25th ed., pp. [phone]). [place], [place]: Elsevier.">  Anemia  Anemia is a condition in which there is not enough red blood cells or hemoglobin in the blood. Hemoglobin is a substance in red blood cells that carries oxygen. When you do not have enough red blood cells or hemoglobin (are anemic), your body cannot get enough oxygen and your organs may not work properly. As a result, you may feel very tired or have other problems. What are the causes? Common causes of anemia include:  Excessive bleeding. Anemia can be caused by excessive bleeding inside or outside the body, including bleeding from the intestines or from heavy menstrual periods in females.  Poor nutrition.  Long-lasting (chronic) kidney, thyroid, and liver disease.  Bone marrow disorders, spleen problems, and blood disorders.  Cancer and treatments for cancer.  HIV (human immunodeficiency virus) and AIDS (acquired immunodeficiency syndrome).  Infections, medicines, and autoimmune disorders that destroy red blood cells. What are the signs or symptoms? Symptoms of this condition include:  Minor weakness.  Dizziness.  Headache, or difficulties concentrating and sleeping.  Heartbeats that feel irregular or faster than normal (palpitations).  Shortness of breath, especially with exercise.  Pale skin, lips, and nails, or cold hands and feet.  Indigestion and nausea. Symptoms may occur suddenly or develop slowly. If your anemia is mild, you may not have symptoms. How is this diagnosed? This condition is diagnosed based on blood tests, your medical history, and a physical exam. In some cases, a test may be needed in which cells are removed from the soft tissue inside of a bone and looked at under a microscope (bone marrow biopsy). Your health care provider may also check your stool (feces) for blood and may do additional testing to look for the cause of your bleeding. Other tests may  include:  Imaging tests, such as a CT scan or MRI.  A procedure to see inside your esophagus and stomach (endoscopy).  A procedure to see inside your colon and rectum (colonoscopy). How is this treated? Treatment for this condition depends on the cause. If you continue to lose a lot of blood, you may need to be treated at a hospital. Treatment may include:  Taking supplements of iron, vitamin Q68, or folic acid.  Taking a hormone medicine (erythropoietin) that can help to stimulate red blood cell growth.  Having a blood transfusion. This may be needed if you lose a lot of blood.  Making changes to your diet.  Having surgery to remove your spleen. Follow these instructions at home:  Take over-the-counter and prescription medicines only as told by your health care provider.  Take supplements only as told by your health care provider.  Follow any diet instructions that you were given by your health care provider.  Keep all follow-up visits as told by your health care provider. This is important. Contact a health care provider if:  You develop new bleeding anywhere in the body. Get help right away if:  You are very weak.  You are short of breath.  You have pain in your abdomen or chest.  You are dizzy or feel faint.  You have trouble concentrating.  You have bloody stools, black stools, or tarry stools.  You vomit repeatedly or you vomit up blood. These symptoms may represent a serious problem that is an emergency. Do not wait to see if the symptoms will go away. Get medical help right away. Call your local emergency services (911 in the [place] Do not  drive yourself to the hospital. Summary  Anemia is a condition in which you do not have enough red blood cells or enough of a substance in your red blood cells that carries oxygen (hemoglobin).  Symptoms may occur suddenly or develop slowly.  If your anemia is mild, you may not have symptoms.  This condition is  diagnosed with blood tests, a medical history, and a physical exam. Other tests may be needed.  Treatment for this condition depends on the cause of the anemia. This information is not intended to replace advice given to you by your health care provider. Make sure you discuss any questions you have with your health care provider. Document Revised: 11/23/2019 Document Reviewed: 11/23/2019 Elsevier Patient Education  2021 [employer]vier Inc.  

## 2021-03-08 ENCOUNTER — Encounter: Payer: Self-pay | Admitting: Gastroenterology

## 2021-03-08 ENCOUNTER — Ambulatory Visit (INDEPENDENT_AMBULATORY_CARE_PROVIDER_SITE_OTHER): Payer: PPO | Admitting: Gastroenterology

## 2021-03-08 ENCOUNTER — Other Ambulatory Visit: Payer: Self-pay

## 2021-03-08 VITALS — BP 105/62 | HR 73 | Temp 97.0°F | Ht 69.0 in | Wt 180.4 lb

## 2021-03-08 DIAGNOSIS — D500 Iron deficiency anemia secondary to blood loss (chronic): Secondary | ICD-10-CM

## 2021-03-08 NOTE — H&P (View-Only) (Signed)
Primary Care Physician: Paulene Floor  Primary Gastroenterologist:  Dr. [name]  No chief complaint on file.   HPI: Clifford Aguilar is a 80 y.o. male here after seeing his primary care provider recently with anemia.  The patient has been on Eliquis. The patient's hemoglobin was checked and showed:  Component     Latest Ref Rng & Units 12/06/2019 04/05/2020 03/05/2021  Hemoglobin     13.0 - 17.7 g/dL 12.0 (L) 11.1 (A) 8.6 (L)  HCT     37.5 - 51.0 % 36.2 (L) 34 (A) 26.9 (L)   The patient also had iron studies that showed a iron saturation of 5 with a ferritin of 17. Due to these findings the patient was set up for an appointment to see me.  He was also started on iron tablets. The patient had a normal upper endoscopy in 2020 and a colonoscopy with 1 small polyp in 2017.. The patient and his wife report that the patient has been having trouble with his blood pressure being very low and then very high.  He was stopped on his multiple hypertensive medications and now states that he runs a little high.  The patient denies any black stools or blood in the stool.  There is no report of any abdominal pain.  He reports that the abdominal pain he had has resolved with multiple small meals throughout the day.  There is no report of any unexplained weight loss fevers chills nausea or vomiting.  He also denies any dysuria or hematuria.  Past Medical History:  Diagnosis Date  . Cancer ([place])    basel cell carcinoma left ear  . Carotid artery occlusion    s/p right carotid endarterectomy  . Chronic kidney disease    renal artery stenosis  . Coronary artery disease    s/p coronary bypass graft surgery in 1992, stable  . GERD (gastroesophageal reflux disease)   . Hyperlipidemia   . Hypertension   . Myocardial infarction ([place])   . PVD (peripheral vascular disease) ([place])    s/p multiple revascularization procedures.  . Shortness of breath   . Sleep apnea     Current Outpatient  Medications  Medication Sig Dispense Refill  . ARIPiprazole (ABILIFY) 2 MG tablet Take 2 mg by mouth daily.    Marland Kitchen atorvastatin (LIPITOR) 40 MG tablet TAKE 1 TABLET BY MOUTH DAILY AT 6PM 90 tablet 3  . clopidogrel (PLAVIX) 75 MG tablet TAKE ONE TABLET BY MOUTH EVERY DAY 90 tablet 3  . Coenzyme Q10 (CO Q-10) 100 MG CAPS Take 100 mg by mouth daily.    Marland Kitchen dicyclomine (BENTYL) 20 MG tablet 1 to 2 tablets daily    . ezetimibe (ZETIA) 10 MG tablet Take 1 tablet (10 mg total) by mouth daily. 90 tablet 3  . fludrocortisone (FLORINEF) 0.1 MG tablet Take 0.5 tablets (0.05 mg total) by mouth as needed. For systolic BP (top number) less than 100.    . Garlic 9371 MG CAPS Take 7,000 mg by mouth daily.     . hyoscyamine (LEVSIN SL) 0.125 MG SL tablet Place 1 tablet (0.125 mg total) under the tongue every 4 (four) hours as needed. 60 tablet 3  . lansoprazole (PREVACID) 15 MG capsule Take 1 capsule (15 mg total) by mouth daily at 12 noon. 30 capsule 6  . levothyroxine (SYNTHROID) 25 MCG tablet TAKE ONE TABLET BY MOUTH EVERY DAY BEFORE BREAKFAST 90 tablet 1  . memantine (NAMENDA) 5 MG  tablet Take 5 mg by mouth 2 (two) times daily.    . metoprolol tartrate (LOPRESSOR) 25 MG tablet Take 0.5 tablets (12.5 mg total) by mouth 2 (two) times daily. 90 tablet 3  . nitroGLYCERIN (NITROLINGUAL) 0.4 MG/SPRAY spray Place 1 spray under the tongue every 5 (five) minutes x 3 doses as needed for chest pain. 12 g 1  . polyethylene glycol (MIRALAX / GLYCOLAX) packet Take 17 g by mouth daily. 90 packet 3  . rivaroxaban (XARELTO) 20 MG TABS tablet TAKE 1 TABLET BY MOUTH DAILY WITH SUPPER 90 tablet 1  . Saw Palmetto 450 MG CAPS Take 450 mg by mouth daily.     No current facility-administered medications for this visit.    Allergies as of 03/08/2021 - Review Complete 03/05/2021  Allergen Reaction Noted  . Hydralazine Rash 03/16/2014  . Clonidine derivatives Other (See Comments) 11/27/2011  . Adhesive [tape]    . Isosorbide   08/13/2017  . Latex Other (See Comments) 08/13/2017  . Trazodone Other (See Comments) 02/16/2018  . Benazepril Other (See Comments) 08/19/2016  . Carvedilol Other (See Comments) 01/27/2014  . Hydrazine yellow [tartrazine] Itching and Rash 03/03/2014  . Isosorbide dinitrate Rash 03/03/2014  . Isosorbide mononitrate Rash 11/26/2011  . Lisinopril Other (See Comments) 06/29/2012  . Losartan Other (See Comments) 03/17/2017    ROS:  General: Negative for anorexia, weight loss, fever, chills, fatigue, weakness. ENT: Negative for hoarseness, difficulty swallowing , nasal congestion. CV: Negative for chest pain, angina, palpitations, dyspnea on exertion, peripheral edema.  Respiratory: Negative for dyspnea at rest, dyspnea on exertion, cough, sputum, wheezing.  GI: See history of present illness. GU:  Negative for dysuria, hematuria, urinary incontinence, urinary frequency, nocturnal urination.  Endo: Negative for unusual weight change.    Physical Examination:   There were no vitals taken for this visit.  General: Well-nourished, well-developed in no acute distress.  Eyes: No icterus. Conjunctivae pink. Lungs: Clear to auscultation bilaterally. Non-labored. Heart: Regular rate and rhythm, no murmurs rubs or gallops.  Abdomen: Bowel sounds are normal, nontender, nondistended, no hepatosplenomegaly or masses, no abdominal bruits or hernia , no rebound or guarding.   Extremities: No lower extremity edema. No clubbing or deformities. Neuro: Alert and oriented x 3.  Grossly intact. Skin: Warm and dry, no jaundice.   Psych: Alert and cooperative, normal mood and affect.  Labs:    Imaging Studies: CT Abdomen Pelvis W Contrast  Result Date: 02/14/2021 CLINICAL DATA:  Intermittent lower cramping abdominal pain with loose stools. Diverticulitis suspected. EXAM: CT ABDOMEN AND PELVIS WITH CONTRAST TECHNIQUE: Multidetector CT imaging of the abdomen and pelvis was performed using the standard  protocol following bolus administration of intravenous contrast. CONTRAST:  173mL OMNIPAQUE IOHEXOL 300 MG/ML  SOLN COMPARISON:  No comparison studies available. FINDINGS: Lower chest: Unremarkable. Hepatobiliary: No suspicious focal abnormality within the liver parenchyma. There is no evidence for gallstones, gallbladder wall thickening, or pericholecystic fluid. No intrahepatic or extrahepatic biliary dilation. Pancreas: No focal mass lesion. No dilatation of the main duct. No intraparenchymal cyst. No peripancreatic edema. Spleen: No splenomegaly. No focal mass lesion. Adrenals/Urinary Tract: No adrenal nodule or mass. 5 cm cyst identified upper pole left kidney. Tiny interpolar low-density right renal lesion is too small to characterize but likely a cyst. No evidence for hydroureter. The urinary bladder appears normal for the degree of distention. Stomach/Bowel: Stomach is unremarkable. No gastric wall thickening. No evidence of outlet obstruction. Duodenum is normally positioned as is the ligament of Treitz. No  small bowel wall thickening. No small bowel dilatation. The terminal ileum is normal. The appendix is normal. No gross colonic mass. No colonic wall thickening. Vascular/Lymphatic: There is abdominal aortic atherosclerosis without aneurysm. There is no gastrohepatic or hepatoduodenal ligament lymphadenopathy. No retroperitoneal or mesenteric lymphadenopathy. No pelvic sidewall lymphadenopathy. Reproductive: The prostate gland and seminal vesicles are unremarkable. Other: No intraperitoneal free fluid. Musculoskeletal: No worrisome lytic or sclerotic osseous abnormality. IMPRESSION: 1. No acute findings in the abdomen or pelvis. Specifically, no findings to explain the patient's history of lower abdominal pain. 2. 5 cm left renal cyst. 3. Aortic Atherosclerosis (ICD10-I70.0). Electronically Signed   By: Misty Stanley M.D.   On: 02/14/2021 14:12   VAS [place] MESENTERIC DUPLEX  Result Date:  03/02/2021 ABDOMINAL VISCERAL High Risk Factors: Hypertension, hyperlipidemia, past history of smoking. Vascular Interventions: 02/2014: Right renal artery stent                          12/03/2011: Left common iliac and external iliac artery                         stent. Limitations: Air/bowel gas. Comparison Study: None Performing Technologist: Ivan Croft  Examination Guidelines: A complete evaluation includes B-mode imaging, spectral Doppler, color Doppler, and power Doppler as needed of all accessible portions of each vessel. Bilateral testing is considered an integral part of a complete examination. Limited examinations for reoccurring indications may be performed as noted.  Duplex Findings: +--------------------+--------+--------+------+--------+ Mesenteric          PSV cm/sEDV cm/sPlaqueComments +--------------------+--------+--------+------+--------+ Celiac Artery Origin  219      32                  +--------------------+--------+--------+------+--------+ SMA Origin            166      45                  +--------------------+--------+--------+------+--------+ SMA Proximal          181      45                  +--------------------+--------+--------+------+--------+ SMA Mid               183      33                  +--------------------+--------+--------+------+--------+ CHA                   115      39                  +--------------------+--------+--------+------+--------+ IMA                   106      46                  +--------------------+--------+--------+------+--------+    Summary: Mesenteric: 70 to 99% stenosis in the celiac artery origin (per peak systolic velocity). Areas of limited visceral study include mesenteric arteries. No evidence of stenosis in the mesenteric artery.  *See table(s) above for measurements and observations.  Diagnosing physician: Jamelle Haring  Electronically signed by Jamelle Haring on 03/02/2021 at 4:44:50 PM.    Final     VAS US CAROTID  Result Date: 02/22/2021 o Carotid Arterial Duplex Study Indications:       Right endarterectomy 1993. Risk  Factors:      Hypertension, coronary artery disease. Other Factors:     Known occlulsion of the left internal carotid artery. Known                    right vertebral artery occlusion. Renal artery stenosis Comparison Study:  Increased right ICA velocity since prior exam of 11/04/2017. Performing Technologist: Alvia Grove RVT  Examination Guidelines: A complete evaluation includes B-mode imaging, spectral Doppler, color Doppler, and power Doppler as needed of all accessible portions of each vessel. Bilateral testing is considered an integral part of a complete examination. Limited examinations for reoccurring indications may be performed as noted.  Right Carotid Findings: +----------+--------+--------+--------+-------------------------+--------+           PSV cm/sEDV cm/sStenosisPlaque Description       Comments +----------+--------+--------+--------+-------------------------+--------+ CCA Prox  184     21                                                +----------+--------+--------+--------+-------------------------+--------+ CCA Mid   117     20              heterogenous                      +----------+--------+--------+--------+-------------------------+--------+ CCA Distal218     22              heterogenous                      +----------+--------+--------+--------+-------------------------+--------+ ICA Prox  236     62      40-59%  calcific and heterogenous         +----------+--------+--------+--------+-------------------------+--------+ ICA Mid   180     52      40-59%                                    +----------+--------+--------+--------+-------------------------+--------+ ICA Distal114     29                                                +----------+--------+--------+--------+-------------------------+--------+ ECA       476      22      >50%                                      +----------+--------+--------+--------+-------------------------+--------+ +----------+--------+-------+----------------+-------------------+           PSV cm/sEDV cmsDescribe        Arm Pressure (mmHG) +----------+--------+-------+----------------+-------------------+ [id number]     6      Multiphasic, WNL                    +----------+--------+-------+----------------+-------------------+ +---------+--------++--------+ VertebralPSV cm/sEDV cm/s +---------+--------++--------+  Left Carotid Findings: +----------+--------+--------+--------+------------------+--------+           PSV cm/sEDV cm/sStenosisPlaque DescriptionComments +----------+--------+--------+--------+------------------+--------+ CCA Prox  51      2                                          +----------+--------+--------+--------+------------------+--------+  CCA Mid   40      2               heterogenous               +----------+--------+--------+--------+------------------+--------+ CCA Distal26      1               heterogenous               +----------+--------+--------+--------+------------------+--------+ ICA Prox                  Occludedcalcific                   +----------+--------+--------+--------+------------------+--------+ ICA Mid                   Occluded                           +----------+--------+--------+--------+------------------+--------+ ICA Distal                Occluded                           +----------+--------+--------+--------+------------------+--------+ ECA       306     12      >50%                               +----------+--------+--------+--------+------------------+--------+ +----------+--------+--------+----------------+-------------------+           PSV cm/sEDV cm/sDescribe        Arm Pressure (mmHG) +----------+--------+--------+----------------+-------------------+  Subclavian218     8       Multiphasic, WNL                    +----------+--------+--------+----------------+-------------------+ +---------+--------+--+--------+--+---------+ VertebralPSV cm/s86EDV cm/s19Antegrade +---------+--------+--+--------+--+---------+ Right Brachial artery 78 cm/s biphasic waveform. Left Brachial artery 89 cm/s biphasic waveform.  Summary: Right Carotid: Velocities in the right ICA are consistent with a 40-59%                stenosis, calcific plaque may obscure higher veloicty. . The ECA                appears >50% stenosed. Left Carotid: Evidence consistent with a total occlusion of the left ICA. The               ECA appears >50% stenosed. Vertebrals:  Left vertebral artery demonstrates antegrade flow. Known right              vertebral artery occlusion, possible recanalization. Subclavians: Normal flow hemodynamics were seen in bilateral subclavian              arteries.               NOTE: Further imaging modality may be warranted. *See table(s) above for measurements and observations.  Electronically signed by Ruta Hinds MD on 02/22/2021 at 12:08:12 PM.    Final    VAS US AORTA/IVC/ILIACS  Result Date: 03/02/2021 ABDOMINAL AORTA STUDY Risk Factors: Hypertension, hyperlipidemia, past history of smoking. Vascular Interventions: 02/2014: Right renal artery stent                          12/03/2011: Left common iliac and external iliac artery  stent. Limitations: Air/bowel gas.  Comparison Study: 05/12/2018: >50% Stenosis seen in the distal external iliacs                   bilaterally. Left external iliac artery stent could be                   visualized. The left common iliac artery stent was difficult                   to visualize secondary to acoustic shadowing from calcified                   plaque. Performing Technologist: Ivan Croft  Examination Guidelines: A complete evaluation includes B-mode imaging, spectral Doppler, color Doppler,  and power Doppler as needed of all accessible portions of each vessel. Bilateral testing is considered an integral part of a complete examination. Limited examinations for reoccurring indications may be performed as noted.  Abdominal Aorta Findings: +-------------+-------+----------+----------+--------+--------+--------+ Location     AP (cm)Trans (cm)PSV (cm/s)WaveformThrombusComments +-------------+-------+----------+----------+--------+--------+--------+ Proximal                      70                                 +-------------+-------+----------+----------+--------+--------+--------+ Mid          1.91   1.88      105                                +-------------+-------+----------+----------+--------+--------+--------+ Distal                        91                                 +-------------+-------+----------+----------+--------+--------+--------+ RT CIA Prox                   174                                +-------------+-------+----------+----------+--------+--------+--------+ RT CIA Distal                 131                                +-------------+-------+----------+----------+--------+--------+--------+ RT EIA Prox                   133                                +-------------+-------+----------+----------+--------+--------+--------+ RT EIA Mid                    220                                +-------------+-------+----------+----------+--------+--------+--------+ RT EIA Distal                 263                                +-------------+-------+----------+----------+--------+--------+--------+  LT CIA Prox                   161                                +-------------+-------+----------+----------+--------+--------+--------+ LT CIA Distal                 141                                +-------------+-------+----------+----------+--------+--------+--------+ LT EIA Prox                    178                                +-------------+-------+----------+----------+--------+--------+--------+ LT EIA Mid                    165                                +-------------+-------+----------+----------+--------+--------+--------+ LT EIA Distal                 255                                +-------------+-------+----------+----------+--------+--------+--------+ Visualization of the proximal aorta, bilateral common iliac arteries, and external arteries was limited.  Summary: Abdominal Aorta: No evidence of an abdominal aortic aneurysm was visualized. >50% Stenosis seen in the distal external iliacs bilaterally. Left iliac stents were not clearly visualized due to bowel gas and acoustic shadowing from calcified plaque. Stenosis: +--------------------+-------------+ Location            Stenosis      +--------------------+-------------+ Right External Iliac>50% stenosis +--------------------+-------------+ Left External Iliac >50% stenosis +--------------------+-------------+   *See table(s) above for measurements and observations.  Electronically signed by Jamelle Haring on 03/02/2021 at 4:46:37 PM.    Final    VAS US RENAL ARTERY DUPLEX  Result Date: 03/02/2021 ABDOMINAL VISCERAL High Risk Factors: Hypertension, hyperlipidemia, past history of smoking. Vascular Interventions: 02/2014: Right renal artery stent                          12/03/2011: Left common iliac and external iliac artery                         stent. Limitations: Air/bowel gas. Comparison Study: 05/12/2018: Evidence of a greater than 60% stenosis of the                   right renal artery; Evidence of a > 60% stenosis in the left                   renal artery. Performing Technologist: Ivan Croft  Examination Guidelines: A complete evaluation includes B-mode imaging, spectral Doppler, color Doppler, and power Doppler as needed of all accessible portions of each vessel. Bilateral  testing is considered an integral part of a complete examination. Limited examinations for reoccurring indications may be performed as noted.  Duplex Findings: +----------+--------+--------+------+--------+ MesentericPSV cm/sEDV cm/sPlaqueComments +----------+--------+--------+------+--------+ Aorta Mid    80                          +----------+--------+--------+------+--------+    +------------------+--------+--------+-------+  Right Renal ArteryPSV cm/sEDV cm/sComment +------------------+--------+--------+-------+ Origin              257      21           +------------------+--------+--------+-------+ Proximal            226      26           +------------------+--------+--------+-------+ Mid                 175      24           +------------------+--------+--------+-------+ Distal              170      22           +------------------+--------+--------+-------+ +-----------------+--------+--------+-------+ Left Renal ArteryPSV cm/sEDV cm/sComment +-----------------+--------+--------+-------+ Origin             267      23           +-----------------+--------+--------+-------+ Proximal           249      31           +-----------------+--------+--------+-------+ Mid                142      17           +-----------------+--------+--------+-------+ Distal             141      16           +-----------------+--------+--------+-------+ +------------+--------+--------+----+-----------+--------+--------+----+ Right KidneyPSV cm/sEDV cm/sRI  Left KidneyPSV cm/sEDV cm/sRI   +------------+--------+--------+----+-----------+--------+--------+----+ Upper Pole                      Upper Pole                      +------------+--------+--------+----+-----------+--------+--------+----+ Mid                             Mid                             +------------+--------+--------+----+-----------+--------+--------+----+ Lower Pole                       Lower Pole                      +------------+--------+--------+----+-----------+--------+--------+----+ Hilar       48      9       0.82Hilar      53      11      0.79 +------------+--------+--------+----+-----------+--------+--------+----+ +------------------+-----+------------------+-----+ Right Kidney           Left Kidney             +------------------+-----+------------------+-----+ RAR                    RAR                     +------------------+-----+------------------+-----+ RAR (manual)           RAR (manual)            +------------------+-----+------------------+-----+ Cortex                 Cortex                  +------------------+-----+------------------+-----+  Cortex thickness       Corex thickness         +------------------+-----+------------------+-----+ Kidney length (cm)10.36Kidney length (cm)10.10 +------------------+-----+------------------+-----+  Summary: Renal:  Right: Evidence of a greater than 60% stenosis of the right renal        artery. Normal size right kidney. RRV flow present. Cyst(s)        noted. Left:  Evidence of a > 60% stenosis in the left renal artery. Normal        size of left kidney. LRV flow present. Cyst(s) noted.  *See table(s) above for measurements and observations.  Diagnosing physician: Jamelle Haring  Electronically signed by Jamelle Haring on 03/02/2021 at 4:44:31 PM.    Final     Assessment and Plan:   Clifford Aguilar is a 80 y.o. y/o male who comes in today with a finding of iron deficiency anemia.  The patient denies seeing any source of his bleeding.  The patient has been on Eliquis.  The patient will need a GI work-up that will include an EGD and colonoscopy and possible capsule endoscopy of the EGD and colonoscopy are negative.  The patient will need to be off the Eliquis and we will contact his cardiologist to see if it is okay to stop his anticoagulation.  The patient and his wife have been  encouraged to continue the iron supplementation and they have agreed to undergo the EGD and colonoscopy.  The patient will follow up in the time of the procedures.     [name], MD. [facility]al    Note: This dictation was prepared with Dragon dictation along with smaller phrase technology. Any transcriptional errors that result from this process are unintentional.

## 2021-03-08 NOTE — Progress Notes (Signed)
Primary Care Physician: Paulene Floor  Primary Gastroenterologist:  Dr. [name]  No chief complaint on file.   HPI: Clifford Aguilar is a 80 y.o. male here after seeing his primary care provider recently with anemia.  The patient has been on Eliquis. The patient's hemoglobin was checked and showed:  Component     Latest Ref Rng & Units 12/06/2019 04/05/2020 03/05/2021  Hemoglobin     13.0 - 17.7 g/dL 12.0 (L) 11.1 (A) 8.6 (L)  HCT     37.5 - 51.0 % 36.2 (L) 34 (A) 26.9 (L)   The patient also had iron studies that showed a iron saturation of 5 with a ferritin of 17. Due to these findings the patient was set up for an appointment to see me.  He was also started on iron tablets. The patient had a normal upper endoscopy in 2020 and a colonoscopy with 1 small polyp in 2017.. The patient and his wife report that the patient has been having trouble with his blood pressure being very low and then very high.  He was stopped on his multiple hypertensive medications and now states that he runs a little high.  The patient denies any black stools or blood in the stool.  There is no report of any abdominal pain.  He reports that the abdominal pain he had has resolved with multiple small meals throughout the day.  There is no report of any unexplained weight loss fevers chills nausea or vomiting.  He also denies any dysuria or hematuria.  Past Medical History:  Diagnosis Date  . Cancer ([place])    basel cell carcinoma left ear  . Carotid artery occlusion    s/p right carotid endarterectomy  . Chronic kidney disease    renal artery stenosis  . Coronary artery disease    s/p coronary bypass graft surgery in 1992, stable  . GERD (gastroesophageal reflux disease)   . Hyperlipidemia   . Hypertension   . Myocardial infarction ([place])   . PVD (peripheral vascular disease) ([place])    s/p multiple revascularization procedures.  . Shortness of breath   . Sleep apnea     Current Outpatient  Medications  Medication Sig Dispense Refill  . ARIPiprazole (ABILIFY) 2 MG tablet Take 2 mg by mouth daily.    Marland Kitchen atorvastatin (LIPITOR) 40 MG tablet TAKE 1 TABLET BY MOUTH DAILY AT 6PM 90 tablet 3  . clopidogrel (PLAVIX) 75 MG tablet TAKE ONE TABLET BY MOUTH EVERY DAY 90 tablet 3  . Coenzyme Q10 (CO Q-10) 100 MG CAPS Take 100 mg by mouth daily.    Marland Kitchen dicyclomine (BENTYL) 20 MG tablet 1 to 2 tablets daily    . ezetimibe (ZETIA) 10 MG tablet Take 1 tablet (10 mg total) by mouth daily. 90 tablet 3  . fludrocortisone (FLORINEF) 0.1 MG tablet Take 0.5 tablets (0.05 mg total) by mouth as needed. For systolic BP (top number) less than 100.    . Garlic 1610 MG CAPS Take 7,000 mg by mouth daily.     . hyoscyamine (LEVSIN SL) 0.125 MG SL tablet Place 1 tablet (0.125 mg total) under the tongue every 4 (four) hours as needed. 60 tablet 3  . lansoprazole (PREVACID) 15 MG capsule Take 1 capsule (15 mg total) by mouth daily at 12 noon. 30 capsule 6  . levothyroxine (SYNTHROID) 25 MCG tablet TAKE ONE TABLET BY MOUTH EVERY DAY BEFORE BREAKFAST 90 tablet 1  . memantine (NAMENDA) 5 MG  tablet Take 5 mg by mouth 2 (two) times daily.    . metoprolol tartrate (LOPRESSOR) 25 MG tablet Take 0.5 tablets (12.5 mg total) by mouth 2 (two) times daily. 90 tablet 3  . nitroGLYCERIN (NITROLINGUAL) 0.4 MG/SPRAY spray Place 1 spray under the tongue every 5 (five) minutes x 3 doses as needed for chest pain. 12 g 1  . polyethylene glycol (MIRALAX / GLYCOLAX) packet Take 17 g by mouth daily. 90 packet 3  . rivaroxaban (XARELTO) 20 MG TABS tablet TAKE 1 TABLET BY MOUTH DAILY WITH SUPPER 90 tablet 1  . Saw Palmetto 450 MG CAPS Take 450 mg by mouth daily.     No current facility-administered medications for this visit.    Allergies as of 03/08/2021 - Review Complete 03/05/2021  Allergen Reaction Noted  . Hydralazine Rash 03/16/2014  . Clonidine derivatives Other (See Comments) 11/27/2011  . Adhesive [tape]    . Isosorbide   08/13/2017  . Latex Other (See Comments) 08/13/2017  . Trazodone Other (See Comments) 02/16/2018  . Benazepril Other (See Comments) 08/19/2016  . Carvedilol Other (See Comments) 01/27/2014  . Hydrazine yellow [tartrazine] Itching and Rash 03/03/2014  . Isosorbide dinitrate Rash 03/03/2014  . Isosorbide mononitrate Rash 11/26/2011  . Lisinopril Other (See Comments) 06/29/2012  . Losartan Other (See Comments) 03/17/2017    ROS:  General: Negative for anorexia, weight loss, fever, chills, fatigue, weakness. ENT: Negative for hoarseness, difficulty swallowing , nasal congestion. CV: Negative for chest pain, angina, palpitations, dyspnea on exertion, peripheral edema.  Respiratory: Negative for dyspnea at rest, dyspnea on exertion, cough, sputum, wheezing.  GI: See history of present illness. GU:  Negative for dysuria, hematuria, urinary incontinence, urinary frequency, nocturnal urination.  Endo: Negative for unusual weight change.    Physical Examination:   There were no vitals taken for this visit.  General: Well-nourished, well-developed in no acute distress.  Eyes: No icterus. Conjunctivae pink. Lungs: Clear to auscultation bilaterally. Non-labored. Heart: Regular rate and rhythm, no murmurs rubs or gallops.  Abdomen: Bowel sounds are normal, nontender, nondistended, no hepatosplenomegaly or masses, no abdominal bruits or hernia , no rebound or guarding.   Extremities: No lower extremity edema. No clubbing or deformities. Neuro: Alert and oriented x 3.  Grossly intact. Skin: Warm and dry, no jaundice.   Psych: Alert and cooperative, normal mood and affect.  Labs:    Imaging Studies: CT Abdomen Pelvis W Contrast  Result Date: 02/14/2021 CLINICAL DATA:  Intermittent lower cramping abdominal pain with loose stools. Diverticulitis suspected. EXAM: CT ABDOMEN AND PELVIS WITH CONTRAST TECHNIQUE: Multidetector CT imaging of the abdomen and pelvis was performed using the standard  protocol following bolus administration of intravenous contrast. CONTRAST:  13mL OMNIPAQUE IOHEXOL 300 MG/ML  SOLN COMPARISON:  No comparison studies available. FINDINGS: Lower chest: Unremarkable. Hepatobiliary: No suspicious focal abnormality within the liver parenchyma. There is no evidence for gallstones, gallbladder wall thickening, or pericholecystic fluid. No intrahepatic or extrahepatic biliary dilation. Pancreas: No focal mass lesion. No dilatation of the main duct. No intraparenchymal cyst. No peripancreatic edema. Spleen: No splenomegaly. No focal mass lesion. Adrenals/Urinary Tract: No adrenal nodule or mass. 5 cm cyst identified upper pole left kidney. Tiny interpolar low-density right renal lesion is too small to characterize but likely a cyst. No evidence for hydroureter. The urinary bladder appears normal for the degree of distention. Stomach/Bowel: Stomach is unremarkable. No gastric wall thickening. No evidence of outlet obstruction. Duodenum is normally positioned as is the ligament of Treitz. No  small bowel wall thickening. No small bowel dilatation. The terminal ileum is normal. The appendix is normal. No gross colonic mass. No colonic wall thickening. Vascular/Lymphatic: There is abdominal aortic atherosclerosis without aneurysm. There is no gastrohepatic or hepatoduodenal ligament lymphadenopathy. No retroperitoneal or mesenteric lymphadenopathy. No pelvic sidewall lymphadenopathy. Reproductive: The prostate gland and seminal vesicles are unremarkable. Other: No intraperitoneal free fluid. Musculoskeletal: No worrisome lytic or sclerotic osseous abnormality. IMPRESSION: 1. No acute findings in the abdomen or pelvis. Specifically, no findings to explain the patient's history of lower abdominal pain. 2. 5 cm left renal cyst. 3. Aortic Atherosclerosis (ICD10-I70.0). Electronically Signed   By: Misty Stanley M.D.   On: 02/14/2021 14:12   VAS [place] MESENTERIC DUPLEX  Result Date:  03/02/2021 ABDOMINAL VISCERAL High Risk Factors: Hypertension, hyperlipidemia, past history of smoking. Vascular Interventions: 02/2014: Right renal artery stent                          12/03/2011: Left common iliac and external iliac artery                         stent. Limitations: Air/bowel gas. Comparison Study: None Performing Technologist: Ivan Croft  Examination Guidelines: A complete evaluation includes B-mode imaging, spectral Doppler, color Doppler, and power Doppler as needed of all accessible portions of each vessel. Bilateral testing is considered an integral part of a complete examination. Limited examinations for reoccurring indications may be performed as noted.  Duplex Findings: +--------------------+--------+--------+------+--------+ Mesenteric          PSV cm/sEDV cm/sPlaqueComments +--------------------+--------+--------+------+--------+ Celiac Artery Origin  219      32                  +--------------------+--------+--------+------+--------+ SMA Origin            166      45                  +--------------------+--------+--------+------+--------+ SMA Proximal          181      45                  +--------------------+--------+--------+------+--------+ SMA Mid               183      33                  +--------------------+--------+--------+------+--------+ CHA                   115      39                  +--------------------+--------+--------+------+--------+ IMA                   106      46                  +--------------------+--------+--------+------+--------+    Summary: Mesenteric: 70 to 99% stenosis in the celiac artery origin (per peak systolic velocity). Areas of limited visceral study include mesenteric arteries. No evidence of stenosis in the mesenteric artery.  *See table(s) above for measurements and observations.  Diagnosing physician: Jamelle Haring  Electronically signed by Jamelle Haring on 03/02/2021 at 4:44:50 PM.    Final     VAS US CAROTID  Result Date: 02/22/2021 o Carotid Arterial Duplex Study Indications:       Right endarterectomy 1993. Risk  Factors:      Hypertension, coronary artery disease. Other Factors:     Known occlulsion of the left internal carotid artery. Known                    right vertebral artery occlusion. Renal artery stenosis Comparison Study:  Increased right ICA velocity since prior exam of 11/04/2017. Performing Technologist: Alvia Grove RVT  Examination Guidelines: A complete evaluation includes B-mode imaging, spectral Doppler, color Doppler, and power Doppler as needed of all accessible portions of each vessel. Bilateral testing is considered an integral part of a complete examination. Limited examinations for reoccurring indications may be performed as noted.  Right Carotid Findings: +----------+--------+--------+--------+-------------------------+--------+           PSV cm/sEDV cm/sStenosisPlaque Description       Comments +----------+--------+--------+--------+-------------------------+--------+ CCA Prox  184     21                                                +----------+--------+--------+--------+-------------------------+--------+ CCA Mid   117     20              heterogenous                      +----------+--------+--------+--------+-------------------------+--------+ CCA Distal218     22              heterogenous                      +----------+--------+--------+--------+-------------------------+--------+ ICA Prox  236     62      40-59%  calcific and heterogenous         +----------+--------+--------+--------+-------------------------+--------+ ICA Mid   180     52      40-59%                                    +----------+--------+--------+--------+-------------------------+--------+ ICA Distal114     29                                                +----------+--------+--------+--------+-------------------------+--------+ ECA       476      22      >50%                                      +----------+--------+--------+--------+-------------------------+--------+ +----------+--------+-------+----------------+-------------------+           PSV cm/sEDV cmsDescribe        Arm Pressure (mmHG) +----------+--------+-------+----------------+-------------------+ [id number]     6      Multiphasic, WNL                    +----------+--------+-------+----------------+-------------------+ +---------+--------++--------+ VertebralPSV cm/sEDV cm/s +---------+--------++--------+  Left Carotid Findings: +----------+--------+--------+--------+------------------+--------+           PSV cm/sEDV cm/sStenosisPlaque DescriptionComments +----------+--------+--------+--------+------------------+--------+ CCA Prox  51      2                                          +----------+--------+--------+--------+------------------+--------+  CCA Mid   40      2               heterogenous               +----------+--------+--------+--------+------------------+--------+ CCA Distal26      1               heterogenous               +----------+--------+--------+--------+------------------+--------+ ICA Prox                  Occludedcalcific                   +----------+--------+--------+--------+------------------+--------+ ICA Mid                   Occluded                           +----------+--------+--------+--------+------------------+--------+ ICA Distal                Occluded                           +----------+--------+--------+--------+------------------+--------+ ECA       306     12      >50%                               +----------+--------+--------+--------+------------------+--------+ +----------+--------+--------+----------------+-------------------+           PSV cm/sEDV cm/sDescribe        Arm Pressure (mmHG) +----------+--------+--------+----------------+-------------------+  Subclavian218     8       Multiphasic, WNL                    +----------+--------+--------+----------------+-------------------+ +---------+--------+--+--------+--+---------+ VertebralPSV cm/s86EDV cm/s19Antegrade +---------+--------+--+--------+--+---------+ Right Brachial artery 78 cm/s biphasic waveform. Left Brachial artery 89 cm/s biphasic waveform.  Summary: Right Carotid: Velocities in the right ICA are consistent with a 40-59%                stenosis, calcific plaque may obscure higher veloicty. . The ECA                appears >50% stenosed. Left Carotid: Evidence consistent with a total occlusion of the left ICA. The               ECA appears >50% stenosed. Vertebrals:  Left vertebral artery demonstrates antegrade flow. Known right              vertebral artery occlusion, possible recanalization. Subclavians: Normal flow hemodynamics were seen in bilateral subclavian              arteries.               NOTE: Further imaging modality may be warranted. *See table(s) above for measurements and observations.  Electronically signed by Ruta Hinds MD on 02/22/2021 at 12:08:12 PM.    Final    VAS US AORTA/IVC/ILIACS  Result Date: 03/02/2021 ABDOMINAL AORTA STUDY Risk Factors: Hypertension, hyperlipidemia, past history of smoking. Vascular Interventions: 02/2014: Right renal artery stent                          12/03/2011: Left common iliac and external iliac artery  stent. Limitations: Air/bowel gas.  Comparison Study: 05/12/2018: >50% Stenosis seen in the distal external iliacs                   bilaterally. Left external iliac artery stent could be                   visualized. The left common iliac artery stent was difficult                   to visualize secondary to acoustic shadowing from calcified                   plaque. Performing Technologist: Ivan Croft  Examination Guidelines: A complete evaluation includes B-mode imaging, spectral Doppler, color Doppler,  and power Doppler as needed of all accessible portions of each vessel. Bilateral testing is considered an integral part of a complete examination. Limited examinations for reoccurring indications may be performed as noted.  Abdominal Aorta Findings: +-------------+-------+----------+----------+--------+--------+--------+ Location     AP (cm)Trans (cm)PSV (cm/s)WaveformThrombusComments +-------------+-------+----------+----------+--------+--------+--------+ Proximal                      70                                 +-------------+-------+----------+----------+--------+--------+--------+ Mid          1.91   1.88      105                                +-------------+-------+----------+----------+--------+--------+--------+ Distal                        91                                 +-------------+-------+----------+----------+--------+--------+--------+ RT CIA Prox                   174                                +-------------+-------+----------+----------+--------+--------+--------+ RT CIA Distal                 131                                +-------------+-------+----------+----------+--------+--------+--------+ RT EIA Prox                   133                                +-------------+-------+----------+----------+--------+--------+--------+ RT EIA Mid                    220                                +-------------+-------+----------+----------+--------+--------+--------+ RT EIA Distal                 263                                +-------------+-------+----------+----------+--------+--------+--------+  LT CIA Prox                   161                                +-------------+-------+----------+----------+--------+--------+--------+ LT CIA Distal                 141                                +-------------+-------+----------+----------+--------+--------+--------+ LT EIA Prox                    178                                +-------------+-------+----------+----------+--------+--------+--------+ LT EIA Mid                    165                                +-------------+-------+----------+----------+--------+--------+--------+ LT EIA Distal                 255                                +-------------+-------+----------+----------+--------+--------+--------+ Visualization of the proximal aorta, bilateral common iliac arteries, and external arteries was limited.  Summary: Abdominal Aorta: No evidence of an abdominal aortic aneurysm was visualized. >50% Stenosis seen in the distal external iliacs bilaterally. Left iliac stents were not clearly visualized due to bowel gas and acoustic shadowing from calcified plaque. Stenosis: +--------------------+-------------+ Location            Stenosis      +--------------------+-------------+ Right External Iliac>50% stenosis +--------------------+-------------+ Left External Iliac >50% stenosis +--------------------+-------------+   *See table(s) above for measurements and observations.  Electronically signed by Jamelle Haring on 03/02/2021 at 4:46:37 PM.    Final    VAS US RENAL ARTERY DUPLEX  Result Date: 03/02/2021 ABDOMINAL VISCERAL High Risk Factors: Hypertension, hyperlipidemia, past history of smoking. Vascular Interventions: 02/2014: Right renal artery stent                          12/03/2011: Left common iliac and external iliac artery                         stent. Limitations: Air/bowel gas. Comparison Study: 05/12/2018: Evidence of a greater than 60% stenosis of the                   right renal artery; Evidence of a > 60% stenosis in the left                   renal artery. Performing Technologist: Ivan Croft  Examination Guidelines: A complete evaluation includes B-mode imaging, spectral Doppler, color Doppler, and power Doppler as needed of all accessible portions of each vessel. Bilateral  testing is considered an integral part of a complete examination. Limited examinations for reoccurring indications may be performed as noted.  Duplex Findings: +----------+--------+--------+------+--------+ MesentericPSV cm/sEDV cm/sPlaqueComments +----------+--------+--------+------+--------+ Aorta Mid    80                          +----------+--------+--------+------+--------+    +------------------+--------+--------+-------+  Right Renal ArteryPSV cm/sEDV cm/sComment +------------------+--------+--------+-------+ Origin              257      21           +------------------+--------+--------+-------+ Proximal            226      26           +------------------+--------+--------+-------+ Mid                 175      24           +------------------+--------+--------+-------+ Distal              170      22           +------------------+--------+--------+-------+ +-----------------+--------+--------+-------+ Left Renal ArteryPSV cm/sEDV cm/sComment +-----------------+--------+--------+-------+ Origin             267      23           +-----------------+--------+--------+-------+ Proximal           249      31           +-----------------+--------+--------+-------+ Mid                142      17           +-----------------+--------+--------+-------+ Distal             141      16           +-----------------+--------+--------+-------+ +------------+--------+--------+----+-----------+--------+--------+----+ Right KidneyPSV cm/sEDV cm/sRI  Left KidneyPSV cm/sEDV cm/sRI   +------------+--------+--------+----+-----------+--------+--------+----+ Upper Pole                      Upper Pole                      +------------+--------+--------+----+-----------+--------+--------+----+ Mid                             Mid                             +------------+--------+--------+----+-----------+--------+--------+----+ Lower Pole                       Lower Pole                      +------------+--------+--------+----+-----------+--------+--------+----+ Hilar       48      9       0.82Hilar      53      11      0.79 +------------+--------+--------+----+-----------+--------+--------+----+ +------------------+-----+------------------+-----+ Right Kidney           Left Kidney             +------------------+-----+------------------+-----+ RAR                    RAR                     +------------------+-----+------------------+-----+ RAR (manual)           RAR (manual)            +------------------+-----+------------------+-----+ Cortex                 Cortex                  +------------------+-----+------------------+-----+  Cortex thickness       Corex thickness         +------------------+-----+------------------+-----+ Kidney length (cm)10.36Kidney length (cm)10.10 +------------------+-----+------------------+-----+  Summary: Renal:  Right: Evidence of a greater than 60% stenosis of the right renal        artery. Normal size right kidney. RRV flow present. Cyst(s)        noted. Left:  Evidence of a > 60% stenosis in the left renal artery. Normal        size of left kidney. LRV flow present. Cyst(s) noted.  *See table(s) above for measurements and observations.  Diagnosing physician: Jamelle Haring  Electronically signed by Jamelle Haring on 03/02/2021 at 4:44:31 PM.    Final     Assessment and Plan:   Clifford Aguilar is a 80 y.o. y/o male who comes in today with a finding of iron deficiency anemia.  The patient denies seeing any source of his bleeding.  The patient has been on Eliquis.  The patient will need a GI work-up that will include an EGD and colonoscopy and possible capsule endoscopy of the EGD and colonoscopy are negative.  The patient will need to be off the Eliquis and we will contact his cardiologist to see if it is okay to stop his anticoagulation.  The patient and his wife have been  encouraged to continue the iron supplementation and they have agreed to undergo the EGD and colonoscopy.  The patient will follow up in the time of the procedures.     [name], MD. Marval Regal    Note: This dictation was prepared with Dragon dictation along with smaller phrase technology. Any transcriptional errors that result from this process are unintentional.

## 2021-03-09 ENCOUNTER — Encounter: Payer: Self-pay | Admitting: Physician Assistant

## 2021-03-09 ENCOUNTER — Other Ambulatory Visit: Payer: Self-pay

## 2021-03-09 DIAGNOSIS — D500 Iron deficiency anemia secondary to blood loss (chronic): Secondary | ICD-10-CM

## 2021-03-09 MED ORDER — CLENPIQ 10-3.5-12 MG-GM -GM/160ML PO SOLN: 320.0000 mg | ORAL | 0 refills | Status: AC

## 2021-03-13 ENCOUNTER — Other Ambulatory Visit: Payer: Self-pay

## 2021-03-13 ENCOUNTER — Other Ambulatory Visit
Admission: RE | Admit: 2021-03-13 | Discharge: 2021-03-13 | Disposition: A | Discharge status: Home / Self Care | Payer: PPO | Source: Ambulatory Visit | Attending: Gastroenterology | Admitting: Gastroenterology

## 2021-03-13 DIAGNOSIS — Z01812 Encounter for preprocedural laboratory examination: Secondary | ICD-10-CM | POA: Diagnosis not present

## 2021-03-13 DIAGNOSIS — Z20822 Contact with and (suspected) exposure to covid-19: Secondary | ICD-10-CM | POA: Insufficient documentation

## 2021-03-13 LAB — SARS CORONAVIRUS 2 (TAT 6-24 HRS): SARS Coronavirus 2: NEGATIVE

## 2021-03-14 ENCOUNTER — Encounter: Payer: Self-pay | Admitting: Gastroenterology

## 2021-03-15 ENCOUNTER — Encounter
Admission: RE | Disposition: A | Discharge status: Home / Self Care | Payer: Self-pay | Source: Home / Self Care | Attending: Gastroenterology

## 2021-03-15 ENCOUNTER — Telehealth: Payer: Self-pay | Admitting: Gastroenterology

## 2021-03-15 ENCOUNTER — Ambulatory Visit: Payer: PPO | Admitting: Registered Nurse

## 2021-03-15 ENCOUNTER — Other Ambulatory Visit: Payer: Self-pay

## 2021-03-15 ENCOUNTER — Encounter: Payer: Self-pay | Admitting: Gastroenterology

## 2021-03-15 ENCOUNTER — Telehealth (INDEPENDENT_AMBULATORY_CARE_PROVIDER_SITE_OTHER): Payer: PPO

## 2021-03-15 ENCOUNTER — Ambulatory Visit
Admission: RE | Admit: 2021-03-15 | Discharge: 2021-03-15 | Disposition: A | Discharge status: Home / Self Care | Payer: PPO | Attending: Gastroenterology | Admitting: Gastroenterology

## 2021-03-15 DIAGNOSIS — I700 Atherosclerosis of aorta: Secondary | ICD-10-CM | POA: Insufficient documentation

## 2021-03-15 DIAGNOSIS — Z87891 Personal history of nicotine dependence: Secondary | ICD-10-CM | POA: Diagnosis not present

## 2021-03-15 DIAGNOSIS — Z91040 Latex allergy status: Secondary | ICD-10-CM | POA: Diagnosis not present

## 2021-03-15 DIAGNOSIS — Z79890 Hormone replacement therapy (postmenopausal): Secondary | ICD-10-CM | POA: Diagnosis not present

## 2021-03-15 DIAGNOSIS — Z79899 Other long term (current) drug therapy: Secondary | ICD-10-CM | POA: Insufficient documentation

## 2021-03-15 DIAGNOSIS — I2510 Atherosclerotic heart disease of native coronary artery without angina pectoris: Secondary | ICD-10-CM | POA: Diagnosis not present

## 2021-03-15 DIAGNOSIS — I739 Peripheral vascular disease, unspecified: Secondary | ICD-10-CM | POA: Insufficient documentation

## 2021-03-15 DIAGNOSIS — I480 Paroxysmal atrial fibrillation: Secondary | ICD-10-CM | POA: Diagnosis not present

## 2021-03-15 DIAGNOSIS — E785 Hyperlipidemia, unspecified: Secondary | ICD-10-CM | POA: Insufficient documentation

## 2021-03-15 DIAGNOSIS — Z7902 Long term (current) use of antithrombotics/antiplatelets: Secondary | ICD-10-CM | POA: Diagnosis not present

## 2021-03-15 DIAGNOSIS — Z951 Presence of aortocoronary bypass graft: Secondary | ICD-10-CM | POA: Insufficient documentation

## 2021-03-15 DIAGNOSIS — D500 Iron deficiency anemia secondary to blood loss (chronic): Secondary | ICD-10-CM

## 2021-03-15 DIAGNOSIS — K5730 Diverticulosis of large intestine without perforation or abscess without bleeding: Secondary | ICD-10-CM | POA: Insufficient documentation

## 2021-03-15 DIAGNOSIS — K633 Ulcer of intestine: Secondary | ICD-10-CM | POA: Diagnosis not present

## 2021-03-15 DIAGNOSIS — I252 Old myocardial infarction: Secondary | ICD-10-CM | POA: Diagnosis not present

## 2021-03-15 DIAGNOSIS — Z7901 Long term (current) use of anticoagulants: Secondary | ICD-10-CM | POA: Diagnosis not present

## 2021-03-15 DIAGNOSIS — G4730 Sleep apnea, unspecified: Secondary | ICD-10-CM | POA: Insufficient documentation

## 2021-03-15 DIAGNOSIS — E039 Hypothyroidism, unspecified: Secondary | ICD-10-CM | POA: Diagnosis not present

## 2021-03-15 DIAGNOSIS — D509 Iron deficiency anemia, unspecified: Secondary | ICD-10-CM | POA: Diagnosis not present

## 2021-03-15 DIAGNOSIS — Z888 Allergy status to other drugs, medicaments and biological substances status: Secondary | ICD-10-CM | POA: Diagnosis not present

## 2021-03-15 DIAGNOSIS — Z1211 Encounter for screening for malignant neoplasm of colon: Secondary | ICD-10-CM

## 2021-03-15 DIAGNOSIS — I129 Hypertensive chronic kidney disease with stage 1 through stage 4 chronic kidney disease, or unspecified chronic kidney disease: Secondary | ICD-10-CM | POA: Insufficient documentation

## 2021-03-15 DIAGNOSIS — K219 Gastro-esophageal reflux disease without esophagitis: Secondary | ICD-10-CM | POA: Diagnosis not present

## 2021-03-15 DIAGNOSIS — N189 Chronic kidney disease, unspecified: Secondary | ICD-10-CM | POA: Diagnosis not present

## 2021-03-15 DIAGNOSIS — Z808 Family history of malignant neoplasm of other organs or systems: Secondary | ICD-10-CM | POA: Diagnosis not present

## 2021-03-15 HISTORY — PX: ESOPHAGOGASTRODUODENOSCOPY (EGD) WITH PROPOFOL: SHX5813

## 2021-03-15 HISTORY — PX: COLONOSCOPY WITH PROPOFOL: SHX5780

## 2021-03-15 LAB — HEMOCCULT GUIAC POC 1CARD (OFFICE)
Card #2 Fecal Occult Blod, POC: NEGATIVE
Card #3 Fecal Occult Blood, POC: NEGATIVE
Fecal Occult Blood, POC: NEGATIVE

## 2021-03-15 SURGERY — COLONOSCOPY WITH PROPOFOL
Anesthesia: General

## 2021-03-15 MED ORDER — PROPOFOL 10 MG/ML IV BOLUS
INTRAVENOUS | Status: DC | PRN
  Administered 2021-03-15: 10 mg via INTRAVENOUS
  Administered 2021-03-15: 60 mg via INTRAVENOUS

## 2021-03-15 MED ORDER — PROPOFOL 500 MG/50ML IV EMUL
INTRAVENOUS | Status: DC | PRN
  Administered 2021-03-15: 150 ug/kg/min via INTRAVENOUS

## 2021-03-15 MED ORDER — ONDANSETRON HCL 4 MG/2ML IJ SOLN
INTRAMUSCULAR | Status: AC
  Filled 2021-03-15: qty 2

## 2021-03-15 MED ORDER — LIDOCAINE HCL (CARDIAC) PF 100 MG/5ML IV SOSY
PREFILLED_SYRINGE | INTRAVENOUS | Status: DC | PRN
  Administered 2021-03-15: 100 mg via INTRAVENOUS

## 2021-03-15 MED ORDER — SODIUM CHLORIDE 0.9 % IV SOLN: INTRAVENOUS | Status: DC

## 2021-03-15 MED ORDER — ROCURONIUM BROMIDE 10 MG/ML (PF) SYRINGE
PREFILLED_SYRINGE | INTRAVENOUS | Status: AC
  Filled 2021-03-15: qty 10

## 2021-03-15 MED ORDER — DEXAMETHASONE SODIUM PHOSPHATE 10 MG/ML IJ SOLN
INTRAMUSCULAR | Status: AC
  Filled 2021-03-15: qty 1

## 2021-03-15 MED ORDER — EPHEDRINE SULFATE 50 MG/ML IJ SOLN
INTRAMUSCULAR | Status: DC | PRN
  Administered 2021-03-15: 10 mg via INTRAVENOUS
  Administered 2021-03-15 (×2): 5 mg via INTRAVENOUS

## 2021-03-15 MED ORDER — PROPOFOL 10 MG/ML IV BOLUS
INTRAVENOUS | Status: AC
  Filled 2021-03-15: qty 20

## 2021-03-15 NOTE — Interval H&P Note (Signed)
Clifford Lame, MD [address] [place],  [address] Phone:[phone] Fax : [phone]  Primary Care Physician:  Clifford Aguilar Primary Gastroenterologist:  Dr. Allen Aguilar  Pre-Procedure History & Physical: HPI:  Clifford Aguilar is a 80 y.o. male is here for an endoscopy and colonoscopy.   Past Medical History:  Diagnosis Date   Cancer ([place])    basel cell carcinoma left ear   Carotid artery occlusion    s/p right carotid endarterectomy   Chronic kidney disease    renal artery stenosis   Coronary artery disease    s/p coronary bypass graft surgery in 1992, stable   GERD (gastroesophageal reflux disease)    Hyperlipidemia    Hypertension    Myocardial infarction ([place])    PVD (peripheral vascular disease) ([place])    s/p multiple revascularization procedures.   Shortness of breath    Sleep apnea     Past Surgical History:  Procedure Laterality Date   abdomial angiogram  03-16-2014   ABDOMINAL ANGIOGRAM N/A 03/16/2014   Procedure: ABDOMINAL ANGIOGRAM;  Surgeon: Clifford Posner, MD;  Location: [facility] CATH LAB;  Service: Cardiovascular;  Laterality: N/A;   ABDOMINAL AORTAGRAM N/A 12/03/2011   Procedure: ABDOMINAL Maxcine Ham;  Surgeon: Clifford Mitchell, MD;  Location: [facility] CATH LAB;  Service: Cardiovascular;  Laterality: N/A;   CAROTID ENDARTERECTOMY     Right. With 46% stenosis in the right by recent Doppler   COLONOSCOPY WITH PROPOFOL N/A 03/12/2016   Procedure: COLONOSCOPY WITH PROPOFOL;  Surgeon: Clifford Lame, MD;  Location: [facility] ENDOSCOPY;  Service: Endoscopy;  Laterality: N/A;   CORONARY ARTERY BYPASS GRAFT  1992   CYST REMOVAL HAND     right index knuckle   ESOPHAGOGASTRODUODENOSCOPY (EGD) WITH PROPOFOL N/A 07/13/2019   Procedure: ESOPHAGOGASTRODUODENOSCOPY (EGD) WITH PROPOFOL;  Surgeon: Clifford Lame, MD;  Location: [facility] ENDOSCOPY;  Service: Endoscopy;  Laterality: N/A;   left common iliac  12/03/2011   LEFT HEART CATH AND CORS/GRAFTS ANGIOGRAPHY Left  12/08/2018   Procedure: LEFT HEART CATH AND CORS/GRAFTS ANGIOGRAPHY;  Surgeon: Clifford Bush, MD;  Location: [place] CV LAB;  Service: Cardiovascular;  Laterality: Left;   MOHS SURGERY     Peripheral vascular revascularization      RENAL ANGIOGRAM  03-16-2014   RENAL ANGIOGRAM Left 12/03/2011   Procedure: RENAL ANGIOGRAM;  Surgeon: Clifford Mitchell, MD;  Location: [facility] CATH LAB;  Service: Cardiovascular;  Laterality: Left;  lt renal artery stent   RENAL ANGIOGRAM N/A 03/16/2014   Procedure: RENAL ANGIOGRAM;  Surgeon: Clifford Posner, MD;  Location: [facility] CATH LAB;  Service: Cardiovascular;  Laterality: N/A;   renal artery stenosis  12/03/2011   SHOULDER ARTHROSCOPY     Right Amnioplasty; Acromioclavicular joint resection and arthroscopic debridement   Surgery for Sleep Apnea      Prior to Admission medications   Medication Sig Start Date End Date Taking? Authorizing Provider  ARIPiprazole (ABILIFY) 2 MG tablet Take 2 mg by mouth daily.   Yes [provider]  atorvastatin (LIPITOR) 40 MG tablet TAKE 1 TABLET BY MOUTH DAILY AT 6PM 07/21/20  Yes Gollan, Kathlene November, MD  ezetimibe (ZETIA) 10 MG tablet Take 1 tablet (10 mg total) by mouth daily. 08/02/19  Yes Gollan, Kathlene November, MD  hyoscyamine (LEVSIN SL) 0.125 MG SL tablet Place 1 tablet (0.125 mg total) under the tongue every 4 (four) hours as needed. 03/30/20 07/26/29 Yes Clifford Lame, MD  lansoprazole (PREVACID) 15 MG capsule Take 1 capsule (15  mg total) by mouth daily at 12 noon. 01/06/14  Yes Clifford Merritts, MD  levothyroxine (SYNTHROID) 25 MCG tablet TAKE ONE TABLET BY MOUTH EVERY DAY BEFORE BREAKFAST 08/03/20  Yes Clifford Aguilar M, PA-C  memantine (NAMENDA) 5 MG tablet Take 5 mg by mouth 2 (two) times daily. 01/03/21  Yes [provider]  metoprolol tartrate (LOPRESSOR) 25 MG tablet Take 0.5 tablets (12.5 mg total) by mouth 2 (two) times daily. 10/23/20  Yes Clifford Merritts, MD  clopidogrel (PLAVIX) 75 MG tablet TAKE ONE TABLET BY  MOUTH EVERY DAY 03/20/20   Clifford Mould, MD  Coenzyme Q10 (CO Q-10) 100 MG CAPS Take 100 mg by mouth daily.    [provider]  dicyclomine (BENTYL) 20 MG tablet 1 to 2 tablets daily    [provider]  fludrocortisone (FLORINEF) 0.1 MG tablet Take 0.5 tablets (0.05 mg total) by mouth as needed. For systolic BP (top number) less than 100. 03/01/21 03/01/22  Clifford Dubonnet, NP  Garlic 6962 MG CAPS Take 7,000 mg by mouth daily.     [provider]  nitroGLYCERIN (NITROLINGUAL) 0.4 MG/SPRAY spray Place 1 spray under the tongue every 5 (five) minutes x 3 doses as needed for chest pain. 10/18/20   Clifford Merritts, MD  polyethylene glycol (MIRALAX / GLYCOLAX) packet Take 17 g by mouth daily. 03/22/16   Clifford Ginsberg, PA  rivaroxaban (XARELTO) 20 MG TABS tablet TAKE 1 TABLET BY MOUTH DAILY WITH SUPPER 05/12/20   Gollan, Kathlene November, MD  Saw Palmetto 450 MG CAPS Take 450 mg by mouth daily.    [provider]  Sod Picosulfate-Mag Ox-Cit Acd (CLENPIQ) 10-3.5-12 MG-GM -GM/160ML SOLN Take 320 mg by mouth as directed. 03/09/21   Clifford Lame, MD    Allergies as of 03/08/2021 - Review Complete 03/08/2021  Allergen Reaction Noted   Hydralazine Rash 03/16/2014   Clonidine derivatives Other (See Comments) 11/27/2011   Adhesive [tape]     Isosorbide  08/13/2017   Latex Other (See Comments) 08/13/2017   Trazodone Other (See Comments) 02/16/2018   Benazepril Other (See Comments) 08/19/2016   Carvedilol Other (See Comments) 01/27/2014   Hydrazine yellow [tartrazine] Itching and Rash 03/03/2014   Isosorbide dinitrate Rash 03/03/2014   Isosorbide mononitrate Rash 11/26/2011   Lisinopril Other (See Comments) 06/29/2012   Losartan Other (See Comments) 03/17/2017    Family History  Problem Relation Age of Onset   Stroke Mother    Heart disease Other    Stroke Sister    Stroke Brother     Social History   Socioeconomic History   Marital status: Married     Spouse name: Not on file   Number of children: 3   Years of education: Not on file   Highest education level: Some college, no degree  Occupational History   Occupation: retired  Tobacco Use   Smoking status: Former Smoker    Types: Cigarettes    Quit date: 12/30/1990    Years since quitting: 30.2   Smokeless tobacco: Former Systems developer    Quit date: 01/10/1991   Tobacco comment: quit smoking in 1992  Vaping Use   Vaping Use: Never used  Substance and Sexual Activity   Alcohol use: Yes    Alcohol/week: 0.0 - 2.0 standard drinks    Comment: maybe 1-2 a month   Drug use: No   Sexual activity: Yes  Other Topics Concern   Not on file  Social History Narrative   Not  on file   Social Determinants of Health   Financial Resource Strain: Low Risk    Difficulty of Paying Living Expenses: Not hard at all  Food Insecurity: No Food Insecurity   Worried About Charity fundraiser in the Last Year: Never true   Arboriculturist in the Last Year: Never true  Transportation Needs: No Transportation Needs   Lack of Transportation (Medical): No   Lack of Transportation (Non-Medical): No  Physical Activity: Sufficiently Active   Days of Exercise per Week: 6 days   Minutes of Exercise per Session: 60 min  Stress: No Stress Concern Present   Feeling of Stress : Not at all  Social Connections: Moderately Integrated   Frequency of Communication with Friends and Family: More than three times a week   Frequency of Social Gatherings with Friends and Family: Once a week   Attends Religious Services: More than 4 times per year   Active Member of [employer] or Organizations: No   Attends Music therapist: Never   Marital Status: Married  Human resources officer Violence: Not At Risk   Fear of Current or Ex-Partner: No   Emotionally Abused: No   Physically Abused: No   Sexually Abused: No    Review of Systems: See HPI, otherwise negative ROS  Physical Exam: BP (!) 109/97   Pulse (!) 55   Temp  97.9 F (36.6 C) (Temporal)   Resp 16   Ht 5\' 9"  (1.753 m)   Wt 81.8 kg   SpO2 100%   BMI 26.63 kg/m  General:   Alert,  pleasant and cooperative in NAD Head:  Normocephalic and atraumatic. Neck:  Supple; no masses or thyromegaly. Lungs:  Clear throughout to auscultation.    Heart:  Regular rate and rhythm. Abdomen:  Soft, nontender and nondistended. Normal bowel sounds, without guarding, and without rebound.   Neurologic:  Alert and  oriented x4;  grossly normal neurologically.  Impression/Plan: RIVEN MABILE is here for an endoscopy and colonoscopy to be performed for IDA  Risks, benefits, limitations, and alternatives regarding  endoscopy and colonoscopy have been reviewed with the patient.  Questions have been answered.  All parties agreeable.   Clifford Lame, MD  03/15/2021, 9:49 AM

## 2021-03-15 NOTE — Op Note (Signed)
[facility] Gastroenterology Patient Name: Clifford Aguilar Procedure Date: 03/15/2021 10:07 AM MRN: [id number] Account #: [id number] Date of Birth: 14-Sep-1941 Admit Type: Outpatient Age: 80 Room: [facility] ENDO ROOM 4 Gender: Male Note Status: Finalized Procedure:             Upper GI endoscopy Indications:           Iron deficiency anemia Providers:             [name] MD, MD Referring MD:          Wendee Beavers. Terrilee Croak (Referring MD) Medicines:             Propofol per Anesthesia Complications:         No immediate complications. Procedure:             Pre-Anesthesia Assessment:                        - Prior to the procedure, a History and Physical was                         performed, and patient medications and allergies were                         reviewed. The patient's tolerance of previous                         anesthesia was also reviewed. The risks and benefits                         of the procedure and the sedation options and risks                         were discussed with the patient. All questions were                         answered, and informed consent was obtained. Prior                         Anticoagulants: The patient has taken no previous                         anticoagulant or antiplatelet agents. ASA Grade                         Assessment: II - A patient with mild systemic disease.                         After reviewing the risks and benefits, the patient                         was deemed in satisfactory condition to undergo the                         procedure.                        After obtaining informed consent, the endoscope was  passed under direct vision. Throughout the procedure,                         the patient's blood pressure, pulse, and oxygen                         saturations were monitored continuously. The Endoscope                         was introduced through the mouth, and advanced to  the                         second part of duodenum. The upper GI endoscopy was                         accomplished without difficulty. The patient tolerated                         the procedure well. Findings:      The esophagus was normal.      The stomach was normal.      The examined duodenum was normal. Impression:            - Normal esophagus.                        - Normal stomach.                        - Normal examined duodenum.                        - No specimens collected. Recommendation:        - Discharge patient to home.                        - Resume previous diet.                        - Continue present medications.                        - Perform a colonoscopy today. Procedure Code(s):     --- Professional ---                        [phone], Esophagogastroduodenoscopy, flexible,                         transoral; diagnostic, including collection of                         specimen(s) by brushing or washing, when performed                         (separate procedure) Diagnosis Code(s):     --- Professional ---                        D50.9, Iron deficiency anemia, unspecified CPT copyright 2019 American Medical Association. All rights reserved. The codes documented in this report are preliminary and upon coder review may  be revised to meet current compliance requirements. Ellie Spickler  Nabria Nevin MD, MD 03/15/2021 10:15:46 AM This report has been signed electronically. Number of Addenda: 0 Note Initiated On: 03/15/2021 10:07 AM Estimated Blood Loss:  Estimated blood loss: none.      [facility]

## 2021-03-15 NOTE — Telephone Encounter (Signed)
Patient's wife called LVM to call back. Patient had procedures this morning.  She wants to know what do they need to do next.

## 2021-03-15 NOTE — Anesthesia Postprocedure Evaluation (Signed)
Anesthesia Post Note  Patient: Clifford Aguilar  Procedure(s) Performed: COLONOSCOPY WITH PROPOFOL (N/A ) ESOPHAGOGASTRODUODENOSCOPY (EGD) WITH PROPOFOL (N/A )  Patient location during evaluation: Endoscopy Anesthesia Type: General Level of consciousness: awake and alert and oriented Pain management: pain level controlled Vital Signs Assessment: post-procedure vital signs reviewed and stable Respiratory status: spontaneous breathing Cardiovascular status: blood pressure returned to baseline Anesthetic complications: no   No complications documented.   Last Vitals:  Vitals:   03/15/21 1100 03/15/21 1110  BP: (!) 153/53 (!) 156/62  Pulse: (!) 55 60  Resp: 17 19  Temp:    SpO2: 100% 100%    Last Pain:  Vitals:   03/15/21 1030  TempSrc: Temporal  PainSc:                  [name]

## 2021-03-15 NOTE — Transfer of Care (Signed)
Immediate Anesthesia Transfer of Care Note  Patient: KONGMENG SANTORO  Procedure(s) Performed: COLONOSCOPY WITH PROPOFOL (N/A ) ESOPHAGOGASTRODUODENOSCOPY (EGD) WITH PROPOFOL (N/A )  Patient Location: Endoscopy Unit  Anesthesia Type:General  Level of Consciousness: drowsy  Airway & Oxygen Therapy: Patient Spontanous Breathing  Post-op Assessment: Report given to RN and Post -op Vital signs reviewed and stable  Post vital signs: Reviewed and stable  Last Vitals:  Vitals Value Taken Time  BP 109/55 03/15/21 1033  Temp    Pulse 52 03/15/21 1034  Resp 17 03/15/21 1034  SpO2 99 % 03/15/21 1034  Vitals shown include unvalidated device data.  Last Pain:  Vitals:   03/15/21 0936  TempSrc: Temporal  PainSc: 0-No pain         Complications: No complications documented.

## 2021-03-15 NOTE — Op Note (Signed)
[facility] Gastroenterology Patient Name: Clifford Aguilar Procedure Date: 03/15/2021 10:06 AM MRN: [id number] Account #: [id number] Date of Birth: 24-Sep-1941 Admit Type: Outpatient Age: 80 Room: [facility] ENDO ROOM 4 Gender: Male Note Status: Finalized Procedure:             Colonoscopy Indications:           Iron deficiency anemia Providers:             [name] MD, MD Referring MD:          Wendee Beavers. Terrilee Croak (Referring MD) Medicines:             Propofol per Anesthesia Complications:         No immediate complications. Procedure:             Pre-Anesthesia Assessment:                        - Prior to the procedure, a History and Physical was                         performed, and patient medications and allergies were                         reviewed. The patient's tolerance of previous                         anesthesia was also reviewed. The risks and benefits                         of the procedure and the sedation options and risks                         were discussed with the patient. All questions were                         answered, and informed consent was obtained. Prior                         Anticoagulants: The patient has taken no previous                         anticoagulant or antiplatelet agents. ASA Grade                         Assessment: II - A patient with mild systemic disease.                         After reviewing the risks and benefits, the patient                         was deemed in satisfactory condition to undergo the                         procedure.                        After obtaining informed consent, the colonoscope was  passed under direct vision. Throughout the procedure,                         the patient's blood pressure, pulse, and oxygen                         saturations were monitored continuously. The                         Colonoscope was introduced through the anus and                          advanced to the the terminal ileum. The colonoscopy                         was performed without difficulty. The patient                         tolerated the procedure well. The quality of the bowel                         preparation was excellent. Findings:      The perianal and digital rectal examinations were normal.      Discontinuous areas of nonbleeding ulcerated mucosa with no stigmata of       recent bleeding were present in the ascending colon. Biopsies were taken       with a cold forceps for histology.      Multiple small-mouthed diverticula were found in the sigmoid colon.      The terminal ileum appeared normal. Impression:            - Mucosal ulceration. Biopsied.                        - Diverticulosis in the sigmoid colon.                        - The examined portion of the ileum was normal. Recommendation:        - Discharge patient to home.                        - Resume previous diet.                        - Continue present medications.                        - Await pathology results. Procedure Code(s):     --- Professional ---                        [phone], Colonoscopy, flexible; with biopsy, single or                         multiple Diagnosis Code(s):     --- Professional ---                        D50.9, Iron deficiency anemia, unspecified                        K63.3, Ulcer  of intestine CPT copyright 2019 American Medical Association. All rights reserved. The codes documented in this report are preliminary and upon coder review may  be revised to meet current compliance requirements. [name] MD, MD 03/15/2021 10:30:53 AM This report has been signed electronically. Number of Addenda: 0 Note Initiated On: 03/15/2021 10:06 AM Scope Withdrawal Time: 0 hours 7 minutes 56 seconds  Total Procedure Duration: 0 hours 11 minutes 28 seconds  Estimated Blood Loss:  Estimated blood loss: none.      [facility]

## 2021-03-15 NOTE — Telephone Encounter (Signed)
Hemoccult cards results

## 2021-03-15 NOTE — Anesthesia Preprocedure Evaluation (Signed)
Anesthesia Evaluation  Patient identified by MRN, date of birth, ID band Patient awake    Reviewed: Allergy & Precautions, H&P , NPO status , Patient's Chart, lab work & pertinent test results, reviewed documented beta blocker date and time   Airway Mallampati: III   Neck ROM: full    Dental  (+) Poor Dentition   Pulmonary shortness of breath, sleep apnea , former smoker,    Pulmonary exam normal        Cardiovascular hypertension, + CAD, + Past MI and + Peripheral Vascular Disease  Normal cardiovascular exam     Neuro/Psych PSYCHIATRIC DISORDERS Dementia negative neurological ROS  negative psych ROS   GI/Hepatic negative GI ROS, Neg liver ROS, GERD  ,  Endo/Other  negative endocrine ROSHypothyroidism   Renal/GU Renal disease  negative genitourinary   Musculoskeletal negative musculoskeletal ROS (+)   Abdominal   Peds negative pediatric ROS (+)  Hematology negative hematology ROS (+)   Anesthesia Other Findings Past Medical History:   PVD (peripheral vascular disease) ([place])                        Comment:s/p multiple revascularization procedures.   Carotid artery occlusion                                       Comment:s/p right carotid endarterectomy   Coronary artery disease                                        Comment:s/p coronary bypass graft surgery in 1992,               stable   Hyperlipidemia                                               Hypertension                                                 Myocardial infarction ([place])                                  Shortness of breath                                          Chronic kidney disease                                         Comment:renal artery stenosis   Cancer ([facility])                                                   Comment:basel cell carcinoma left  ear   GERD (gastroesophageal reflux disease)                     Past Surgical History:    Surgery for Sleep Apnea                                       CORONARY ARTERY BYPASS GRAFT                     1992         CAROTID ENDARTERECTOMY                                          Comment:Right. With 46% stenosis in the right by recent              Doppler   Peripheral vascular revascularization                         SHOULDER ARTHROSCOPY                                            Comment:Right Amnioplasty; Acromioclavicular joint               resection and arthroscopic debridement   renal artery stenosis                            12/03/2011   left common iliac                                12/03/2011   abdomial angiogram                               03-16-2014   RENAL ANGIOGRAM                                  03-16-2014   ABDOMINAL AORTAGRAM                             N/A 12/03/2011      Comment:Procedure: ABDOMINAL Maxcine Ham;  Surgeon: Serafina Mitchell, MD;  Location: [facility] CATH LAB;                Service: Cardiovascular;  Laterality: N/A;   RENAL ANGIOGRAM                                 Left 12/03/2011      Comment:Procedure: RENAL ANGIOGRAM;  Surgeon: Serafina Mitchell, MD;  Location: [facility] CATH LAB;  Service:               Cardiovascular;  Laterality: Left;  lt renal  artery stent   ABDOMINAL ANGIOGRAM                             N/A 03/16/2014      Comment:Procedure: ABDOMINAL ANGIOGRAM;  Surgeon: Rosetta Posner, MD;  Location: [facility] CATH LAB;  Service:               Cardiovascular;  Laterality: N/A;   RENAL ANGIOGRAM                                 N/A 03/16/2014      Comment:Procedure: RENAL ANGIOGRAM;  Surgeon: Rosetta Posner, MD;  Location: [facility] CATH LAB;  Service:               Cardiovascular;  Laterality: N/A;   Reproductive/Obstetrics                             Anesthesia Physical  Anesthesia Plan  ASA: IV  Anesthesia Plan: General   Post-op Pain Management:    Induction:  Intravenous  PONV Risk Score and Plan: Propofol infusion and TIVA  Airway Management Planned: Nasal Cannula  Additional Equipment:   Intra-op Plan:   Post-operative Plan:   Informed Consent: I have reviewed the patients History and Physical, chart, labs and discussed the procedure including the risks, benefits and alternatives for the proposed anesthesia with the patient or authorized representative who has indicated his/her understanding and acceptance.     Dental Advisory Given  Plan Discussed with: CRNA  Anesthesia Plan Comments:         Anesthesia Quick Evaluation

## 2021-03-16 ENCOUNTER — Encounter: Payer: Self-pay | Admitting: Gastroenterology

## 2021-03-16 LAB — SURGICAL PATHOLOGY

## 2021-03-16 NOTE — Progress Notes (Signed)
Will cc anticoagulation clinic

## 2021-03-18 ENCOUNTER — Encounter: Payer: Self-pay | Admitting: Gastroenterology

## 2021-03-19 ENCOUNTER — Telehealth: Payer: Self-pay | Admitting: Gastroenterology

## 2021-03-19 NOTE — Telephone Encounter (Signed)
GO to the ER if he feels anemia.

## 2021-03-19 NOTE — Addendum Note (Signed)
Addended by: Mar Daring on: 03/19/2021 05:34 PM   Modules accepted: Orders

## 2021-03-19 NOTE — Telephone Encounter (Signed)
Contacted pt's wife regarding message today of patient feeling anemic. Advised pt's wife, per Dr. Allen Norris if he is feeling like he is anemic he is to go to the ER today. Previous hemoglobin 2 weeks ago was 8.6. Wife stated he is feeling very tired today. Pt will contact her PCP in the morning to have labs repeated. Advised her if he not able to get up due to the fatigue, she is to call EMS right away. She verbalized understanding.

## 2021-03-19 NOTE — Telephone Encounter (Signed)
Pt was advised we will need results of his procedure before notifying him of the next step in treatment.

## 2021-03-19 NOTE — Telephone Encounter (Signed)
Patient is feel anemic. What can he do?

## 2021-03-20 DIAGNOSIS — D500 Iron deficiency anemia secondary to blood loss (chronic): Secondary | ICD-10-CM | POA: Diagnosis not present

## 2021-03-21 ENCOUNTER — Telehealth: Payer: Self-pay | Admitting: Gastroenterology

## 2021-03-21 LAB — CBC WITH DIFFERENTIAL/PLATELET
Basophils Absolute: 0.1 10*3/uL (ref 0.0–0.2)
Basos: 1 %
EOS (ABSOLUTE): 0.2 10*3/uL (ref 0.0–0.4)
Eos: 3 %
Hematocrit: 32.1 % — ABNORMAL LOW (ref 37.5–51.0)
Hemoglobin: 9.6 g/dL — ABNORMAL LOW (ref 13.0–17.7)
Immature Grans (Abs): 0 10*3/uL (ref 0.0–0.1)
Immature Granulocytes: 0 %
Lymphocytes Absolute: 1.9 10*3/uL (ref 0.7–3.1)
Lymphs: 24 %
MCH: 25.6 pg — ABNORMAL LOW (ref 26.6–33.0)
MCHC: 29.9 g/dL — ABNORMAL LOW (ref 31.5–35.7)
MCV: 86 fL (ref 79–97)
Monocytes Absolute: 0.9 10*3/uL (ref 0.1–0.9)
Monocytes: 12 %
Neutrophils Absolute: 4.6 10*3/uL (ref 1.4–7.0)
Neutrophils: 60 %
Platelets: 248 10*3/uL (ref 150–450)
RBC: 3.75 x10E6/uL — ABNORMAL LOW (ref 4.14–5.80)
RDW: 15.4 % (ref 11.6–15.4)
WBC: 7.6 10*3/uL (ref 3.4–10.8)

## 2021-03-21 LAB — IRON,TIBC AND FERRITIN PANEL
Ferritin: 85 ng/mL (ref 30–400)
Iron Saturation: 10 % — ABNORMAL LOW (ref 15–55)
Iron: 32 ug/dL — ABNORMAL LOW (ref 38–169)
Total Iron Binding Capacity: 316 ug/dL (ref 250–450)
UIBC: 284 ug/dL (ref 111–343)

## 2021-03-21 NOTE — Telephone Encounter (Signed)
Patient's wife called with multiple questions regarding patient's next step for treatment. Please call patient's wife.

## 2021-03-21 NOTE — Telephone Encounter (Signed)
Please see lab results done recently by PCP. Pt's wife is calling for next step.

## 2021-03-22 ENCOUNTER — Telehealth: Payer: Self-pay

## 2021-03-22 NOTE — Telephone Encounter (Signed)
I called pt and pt's wife Clifford Aguilar answered. Clifford Aguilar verbalized understanding of information below.

## 2021-03-22 NOTE — Telephone Encounter (Signed)
-----   Message from Mar Daring, [place] sent at 03/22/2021  1:21 PM EDT ----- Gregary Signs hemoglobin has actually come up to 9.6 now, had been 8.6. Also his iron levels are slowly increasing. These are all good signs.   Grace Bushy, [facility]

## 2021-03-22 NOTE — Telephone Encounter (Signed)
Let the patient's wife know that the next step would be to do a capsule endoscopy to see if there is a sign of small bowel bleeding but reassure her that her husband hemoglobin is higher now than it was previously and his iron studies have improved.  Therefore all of his labs are heading in the right direction.

## 2021-03-22 NOTE — Telephone Encounter (Signed)
Pt notified of recommendation of possible capsule study.

## 2021-03-26 NOTE — Progress Notes (Signed)
Cardiology Office Note  Date:  03/27/2021   ID:  YEIREN WHITECOTTON, DOB [birth year], MRN [id number]  PCP:  Trinna Post, PA-C   Chief Complaint  Patient presents with  . Follow-up    1 Month follow up. Medications verbally reviewed with patient.     HPI:  Mr. Clifford Aguilar is a 80 years old with past medical history of PAF on anticoagulation CAD,  peripheral vascular disease,  CABG bypass surgery in 1992,  carotid endarterectomy on the right with residual 60-70% disease on last ultrasound in 2013 , chronically occluded left carotid artery,  multiple surgical procedures on his lower extremity by Dr. Donnetta Hutching,  left iliac stent and left renal angioplasty with Dr. 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% Atrial fib 11/2018 who presents for routine followup of his CAD, s/p CABG, atrial fib  Last clinic visit with myself July 2021 Seen by one of our providers March 2022 Presented for labile blood pressure  Long history of hypertension, has been on Cardura 8 mg twice daily for 5 years Symptoms came to ahead 2018 when he had renal artery stenosis, severe hypertension Stent placed at that time, has been stable on doxazosin 8 twice daily until recently  Was seen in neurology, noted to have hypotension Neurology started him on Florinef daily Since then his blood pressure was elevated Was seen in our clinic, Cardura weaned down, now off Also at the same time with Hemoglobin 8.6 down from 11.1 He is on Xarelto for atrial fibrillation  Follow-up lab work performed 1 week ago hemoglobin 9.6 up from 8.6 Low iron of 15 now up to 32 He is taking iron daily  Vascular vein has evaluated mesenteric vessels For GI pain Stomach pain now better, eating smaller meals  Has had work-up with GI colonoscopy EGD, Declined for video capsule  Blood pressure  measurements reviewed, frequent systolic pressures 570 up to 170 sometimes down to 120  Nonsmoker, quit 1992,  Labs reviewed with him on today's visit Total chol 132, LDL 72 HBA1C 5.07 Dec 2019  Prior issues with chronic diarrhea and vomiting, this has improved Previously seen by  Dr. Allen Norris Diagnosed with GERD  Other past medical history reviewed prior episode of paroxysmal atrial fibrillation Went to dentist, then out to lunch Did not feel well, took NTG Lay down in the bed Woke up with chest discomfort, did not feel well Went to the ER, noted to be in atrial fib with RVR, December 07, 2018  Admitted to the hospital, that afternoon went back into normal sinus rhythm Elevated troponin due to demand ischemia:  Troponin 4.16. Catheterization performed 1. Severe native CAD, including diffusely disease proximal LAD up to 80% and CTO's of ostial LCx and ostial RCA. 2. Patent LIMA->LAD, RIMI->RCA, and SVG->diag. Patient reportedly had CABG x 4; question if jump portion of SVG to an OM is occluded  Aspirin has been discontinued and started on Xarelto for atrial fibrillation.   Since discharge denies any tachycardia or palpitations concerning for recurrent atrial fibrillation  Carotid 10/16 40 to 59% on the right Followed in [facility]  3/15 he was seen by Dr. Donnetta Hutching Found to have renal artery stenosis that had progressed,  3/18 he had catheterization for renal artery stent placement    PMH:   has a past medical history of Cancer [facility]), Carotid artery occlusion, Chronic kidney disease, Coronary artery disease,  GERD (gastroesophageal reflux disease), Hyperlipidemia, Hypertension, Myocardial infarction [facility]), PVD (peripheral vascular disease) ([place]), Shortness of breath, and Sleep apnea.  PSH:    Past Surgical History:  Procedure Laterality Date  . abdomial angiogram  03-16-2014  . ABDOMINAL ANGIOGRAM N/A 03/16/2014   Procedure: ABDOMINAL ANGIOGRAM;  Surgeon: Rosetta Posner, MD;   Location: [facility] CATH LAB;  Service: Cardiovascular;  Laterality: N/A;  . ABDOMINAL AORTAGRAM N/A 12/03/2011   Procedure: ABDOMINAL AORTAGRAM;  Surgeon: Serafina Mitchell, MD;  Location: [facility] CATH LAB;  Service: Cardiovascular;  Laterality: N/A;  . CAROTID ENDARTERECTOMY     Right. With 46% stenosis in the right by recent Doppler  . COLONOSCOPY WITH PROPOFOL N/A 03/12/2016   Procedure: COLONOSCOPY WITH PROPOFOL;  Surgeon: [name], MD;  Location: [facility] ENDOSCOPY;  Service: Endoscopy;  Laterality: N/A;  . COLONOSCOPY WITH PROPOFOL N/A 03/15/2021   Procedure: COLONOSCOPY WITH PROPOFOL;  Surgeon: [name], MD;  Location: [facility] ENDOSCOPY;  Service: Endoscopy;  Laterality: N/A;  . CORONARY ARTERY BYPASS GRAFT  1992  . CYST REMOVAL HAND     right index knuckle  . ESOPHAGOGASTRODUODENOSCOPY (EGD) WITH PROPOFOL N/A 07/13/2019   Procedure: ESOPHAGOGASTRODUODENOSCOPY (EGD) WITH PROPOFOL;  Surgeon: [name], MD;  Location: [facility] ENDOSCOPY;  Service: Endoscopy;  Laterality: N/A;  . ESOPHAGOGASTRODUODENOSCOPY (EGD) WITH PROPOFOL N/A 03/15/2021   Procedure: ESOPHAGOGASTRODUODENOSCOPY (EGD) WITH PROPOFOL;  Surgeon: [name], MD;  Location: [facility] ENDOSCOPY;  Service: Endoscopy;  Laterality: N/A;  . left common iliac  12/03/2011  . LEFT HEART CATH AND CORS/GRAFTS ANGIOGRAPHY Left 12/08/2018   Procedure: LEFT HEART CATH AND CORS/GRAFTS ANGIOGRAPHY;  Surgeon: Nelva Bush, MD;  Location: [place] CV LAB;  Service: Cardiovascular;  Laterality: Left;  . MOHS SURGERY    . Peripheral vascular revascularization     . RENAL ANGIOGRAM  03-16-2014  . RENAL ANGIOGRAM Left 12/03/2011   Procedure: RENAL ANGIOGRAM;  Surgeon: Serafina Mitchell, MD;  Location: [facility] CATH LAB;  Service: Cardiovascular;  Laterality: Left;  lt renal artery stent  . RENAL ANGIOGRAM N/A 03/16/2014   Procedure: RENAL ANGIOGRAM;  Surgeon: Rosetta Posner, MD;  Location: [place] CATH LAB;  Service: Cardiovascular;  Laterality: N/A;  . renal artery  stenosis  12/03/2011  . SHOULDER ARTHROSCOPY     Right Amnioplasty; Acromioclavicular joint resection and arthroscopic debridement  . Surgery for Sleep Apnea      Current Outpatient Medications  Medication Sig Dispense Refill  . ARIPiprazole (ABILIFY) 2 MG tablet Take 2 mg by mouth daily.    Marland Kitchen atorvastatin (LIPITOR) 40 MG tablet TAKE 1 TABLET BY MOUTH DAILY AT 6PM 90 tablet 3  . clopidogrel (PLAVIX) 75 MG tablet TAKE ONE TABLET BY MOUTH EVERY DAY 90 tablet 3  . Coenzyme Q10 (CO Q-10) 100 MG CAPS Take 100 mg by mouth daily.    Marland Kitchen ezetimibe (ZETIA) 10 MG tablet Take 1 tablet (10 mg total) by mouth daily. 90 tablet 3  . fludrocortisone (FLORINEF) 0.1 MG tablet Take 0.5 tablets (0.05 mg total) by mouth as needed. For systolic BP (top number) less than 100.    . Garlic 8315 MG CAPS Take 7,000 mg by mouth daily.     . lansoprazole (PREVACID) 15 MG capsule Take 1 capsule (15 mg total) by mouth daily at 12 noon. 30 capsule 6  . levothyroxine (SYNTHROID) 25 MCG tablet TAKE ONE TABLET BY MOUTH EVERY DAY BEFORE BREAKFAST 90 tablet 1  . memantine (NAMENDA) 5 MG tablet Take 5 mg by mouth 2 (two) times  daily.    . metoprolol tartrate (LOPRESSOR) 25 MG tablet Take 0.5 tablets (12.5 mg total) by mouth 2 (two) times daily. 90 tablet 3  . nitroGLYCERIN (NITROLINGUAL) 0.4 MG/SPRAY spray Place 1 spray under the tongue every 5 (five) minutes x 3 doses as needed for chest pain. 12 g 1  . polyethylene glycol (MIRALAX / GLYCOLAX) packet Take 17 g by mouth daily. 90 packet 3  . rivaroxaban (XARELTO) 20 MG TABS tablet TAKE 1 TABLET BY MOUTH DAILY WITH SUPPER 90 tablet 1  . Saw Palmetto 450 MG CAPS Take 450 mg by mouth daily.    . Sod Picosulfate-Mag Ox-Cit Acd (CLENPIQ) 10-3.5-12 MG-GM -GM/160ML SOLN Take 320 mg by mouth as directed. 320 mL 0   No current facility-administered medications for this visit.     Allergies:   Hydralazine, Clonidine derivatives, Adhesive [tape], Isosorbide, Latex, Trazodone, Benazepril,  Carvedilol, Hydrazine yellow [tartrazine], Isosorbide dinitrate, Isosorbide mononitrate, Lisinopril, and Losartan   Social History:  The patient  reports that he quit smoking about 30 years ago. His smoking use included cigarettes. He quit smokeless tobacco use about 30 years ago. He reports current alcohol use. He reports that he does not use drugs.   Family History:   family history includes Heart disease in an other family member; Stroke in his brother, mother, and sister.    Review of Systems: Review of Systems  Constitutional: Negative.   HENT: Negative.   Respiratory: Negative.   Cardiovascular: Negative.   Gastrointestinal: Negative.   Musculoskeletal: Negative.        Leg weakness  Neurological: Negative.   Psychiatric/Behavioral: Negative.   All other systems reviewed and are negative.   PHYSICAL EXAM: VS:  BP (!) 120/58 (BP Location: Left Arm, Patient Position: Sitting, Cuff Size: Normal)   Pulse (!) 56   Ht 5\' 9"  (1.753 m)   Wt 183 lb (83 kg)   SpO2 98%   BMI 27.02 kg/m  , BMI Body mass index is 27.02 kg/m. Constitutional:  oriented to person, place, and time. No distress.  HENT:  Head: Grossly normal Eyes:  no discharge. No scleral icterus.  Neck: No JVD, no carotid bruits  Cardiovascular: Regular rate and rhythm, no murmurs appreciated Pulmonary/Chest: Clear to auscultation bilaterally, no wheezes or rails Abdominal: Soft.  no distension.  no tenderness.  Musculoskeletal: Normal range of motion Neurological:  normal muscle tone. Coordination normal. No atrophy Skin: Skin warm and dry Psychiatric: normal affect, pleasant   Recent Labs: 04/05/2020: TSH 3.42 03/05/2021: ALT 12; BUN 14; Creatinine, Ser 0.80; Potassium 4.6; Sodium 138 03/20/2021: Hemoglobin 9.6; Platelets 248    Lipid Panel Lab Results  Component Value Date   CHOL 131 09/11/2020   HDL 32 (L) 09/11/2020   LDLCALC 74 09/11/2020   TRIG 126 09/11/2020      Wt Readings from Last 3 Encounters:   03/27/21 183 lb (83 kg)  03/15/21 180 lb 5.4 oz (81.8 kg)  03/08/21 180 lb 6.4 oz (81.8 kg)      ASSESSMENT AND PLAN:  Atherosclerosis of native coronary artery of native heart without angina pectoris -  Currently with no symptoms of angina. No further workup at this time. Continue current medication regimen.  Bilateral carotid artery stenosis - Plan: EKG 12-Lead stable disease Followed by vascular Cholesterol at goal  PAD (peripheral artery disease) (HCC) - Plan: EKG 12-Lead Nonsmoker, lipids stable, no diabetes We will recommend carotid ultrasound in follow-up  Essential hypertension On metoprolol for atrial fibrillation rhythm control,  Neurology started him on Florinef, this has been held as blood pressure is high BP now elevated at home Etiology of his low pressures is unclear, he does have a history of labile pressures Recommend plan as below as blood pressures are starting to trend back upwards at home For blood pressure >150 (after breakfast), Give a doxazosin 2 mg (1/4 pill) Recheck in afternoon, 5-6 pm If >150, take another doxazosin 2 mg (1/4 pill) She will monitor blood pressures and call us with some numbers  Paroxysmal atrial fibrillation On xarelto, metoprolol Iron deficiency anemia, maintaining normal sinus rhythm  Mixed hyperlipidemia Continue Lipitor Zetia, cholesterol at goal   Total encounter time more than 25 minutes  Greater than 50% was spent in counseling and coordination of care with the patient    No orders of the defined types were placed in this encounter.    Signed, Clifford Aguilar, M.D., Ph.D. 03/27/2021  [place], [place]

## 2021-03-27 ENCOUNTER — Encounter: Payer: Self-pay | Admitting: Cardiovascular Disease

## 2021-03-27 ENCOUNTER — Ambulatory Visit: Payer: PPO | Admitting: Cardiovascular Disease

## 2021-03-27 ENCOUNTER — Other Ambulatory Visit: Payer: Self-pay

## 2021-03-27 VITALS — BP 120/58 | HR 56 | Ht 69.0 in | Wt 183.0 lb

## 2021-03-27 DIAGNOSIS — N182 Chronic kidney disease, stage 2 (mild): Secondary | ICD-10-CM | POA: Diagnosis not present

## 2021-03-27 DIAGNOSIS — I701 Atherosclerosis of renal artery: Secondary | ICD-10-CM | POA: Diagnosis not present

## 2021-03-27 DIAGNOSIS — N281 Cyst of kidney, acquired: Secondary | ICD-10-CM | POA: Diagnosis not present

## 2021-03-27 DIAGNOSIS — I2510 Atherosclerotic heart disease of native coronary artery without angina pectoris: Secondary | ICD-10-CM | POA: Diagnosis not present

## 2021-03-27 DIAGNOSIS — I10 Essential (primary) hypertension: Secondary | ICD-10-CM | POA: Diagnosis not present

## 2021-03-27 DIAGNOSIS — I6523 Occlusion and stenosis of bilateral carotid arteries: Secondary | ICD-10-CM | POA: Diagnosis not present

## 2021-03-27 DIAGNOSIS — I480 Paroxysmal atrial fibrillation: Secondary | ICD-10-CM

## 2021-03-27 DIAGNOSIS — I959 Hypotension, unspecified: Secondary | ICD-10-CM

## 2021-03-27 DIAGNOSIS — I739 Peripheral vascular disease, unspecified: Secondary | ICD-10-CM | POA: Diagnosis not present

## 2021-03-27 DIAGNOSIS — G309 Alzheimer's disease, unspecified: Secondary | ICD-10-CM | POA: Diagnosis not present

## 2021-03-27 DIAGNOSIS — F0280 Dementia in other diseases classified elsewhere without behavioral disturbance: Secondary | ICD-10-CM | POA: Diagnosis not present

## 2021-03-27 DIAGNOSIS — N400 Enlarged prostate without lower urinary tract symptoms: Secondary | ICD-10-CM | POA: Diagnosis not present

## 2021-03-27 NOTE — Patient Instructions (Addendum)
Medication Instructions:   For blood pressure >150 (after breakfast),  Take doxazosin 2 mg (1/4 pill)   Recheck BP in afternoon (5-6 pm)  If BP is greater then 150 (top number)  take another doxazosin 2 mg (1/4 pill) Lab work: No new labs needed  Testing/Procedures: No new testing needed   Follow-Up:  . You will need a follow up appointment in 1 month  . Providers on your designated Care Team:   . Murray Hodgkins, NP . Christell Faith, PA-C . Marrianne Mood, PA-C   How to use a home blood pressure monitor. . Be still. . Measure at the same time every day. It's important to take the readings at the same time each day, such as morning and evening. Take reading approximately 1 1/2 to 2 hours after BP medications.   AVOID these things for 30 minutes before checking your blood pressure:  Drinking caffeine.  Drinking alcohol.  Eating.  Smoking.  Exercising.   Five minutes before checking your blood pressure:  Pee.  Sit in a dining chair. Avoid sitting in a soft couch or armchair.  Be quiet. Do not talk.       Sit correctly. Sit with your back straight and supported (on a dining chair, rather than a sofa). Your feet should be flat on the floor and your legs should not be crossed. Your arm should be supported on a flat surface (such as a table) with the upper arm at heart level. Make sure the bottom of the cuff is placed directly above the bend of the elbow.

## 2021-03-28 ENCOUNTER — Encounter: Payer: Self-pay | Admitting: Physician Assistant

## 2021-03-28 DIAGNOSIS — Z7901 Long term (current) use of anticoagulants: Secondary | ICD-10-CM

## 2021-03-29 NOTE — Addendum Note (Signed)
Addended by: Mar Daring on: 03/29/2021 12:23 PM   Modules accepted: Orders

## 2021-03-30 ENCOUNTER — Telehealth: Payer: Self-pay | Admitting: *Deleted

## 2021-03-30 DIAGNOSIS — Z7901 Long term (current) use of anticoagulants: Secondary | ICD-10-CM | POA: Diagnosis not present

## 2021-03-30 NOTE — Telephone Encounter (Signed)
Received letter from patient, called to address concerns.  Patient and his wife were thankful for the call.

## 2021-03-31 ENCOUNTER — Encounter: Payer: Self-pay | Admitting: Physician Assistant

## 2021-03-31 DIAGNOSIS — D500 Iron deficiency anemia secondary to blood loss (chronic): Secondary | ICD-10-CM

## 2021-03-31 LAB — PT AND PTT
INR: 1.2 (ref 0.9–1.2)
Prothrombin Time: 12.3 s — ABNORMAL HIGH (ref 9.1–12.0)
aPTT: 32 s (ref 24–33)

## 2021-03-31 LAB — HEPARIN ANTI-XA: Heparin Anti-Xa: 1.07 IU/mL

## 2021-04-09 ENCOUNTER — Other Ambulatory Visit: Payer: Self-pay | Admitting: Cardiovascular Disease

## 2021-04-24 DIAGNOSIS — D500 Iron deficiency anemia secondary to blood loss (chronic): Secondary | ICD-10-CM | POA: Diagnosis not present

## 2021-04-25 LAB — CBC WITH DIFFERENTIAL/PLATELET
Basophils Absolute: 0.1 10*3/uL (ref 0.0–0.2)
Basos: 1 %
EOS (ABSOLUTE): 0.1 10*3/uL (ref 0.0–0.4)
Eos: 1 %
Hematocrit: 37.8 % (ref 37.5–51.0)
Hemoglobin: 12.2 g/dL — ABNORMAL LOW (ref 13.0–17.7)
Immature Grans (Abs): 0 10*3/uL (ref 0.0–0.1)
Immature Granulocytes: 0 %
Lymphocytes Absolute: 1.5 10*3/uL (ref 0.7–3.1)
Lymphs: 21 %
MCH: 27.3 pg (ref 26.6–33.0)
MCHC: 32.3 g/dL (ref 31.5–35.7)
MCV: 85 fL (ref 79–97)
Monocytes Absolute: 0.7 10*3/uL (ref 0.1–0.9)
Monocytes: 10 %
Neutrophils Absolute: 5 10*3/uL (ref 1.4–7.0)
Neutrophils: 67 %
Platelets: 226 10*3/uL (ref 150–450)
RBC: 4.47 x10E6/uL (ref 4.14–5.80)
RDW: 16.8 % — ABNORMAL HIGH (ref 11.6–15.4)
WBC: 7.4 10*3/uL (ref 3.4–10.8)

## 2021-04-25 LAB — IRON,TIBC AND FERRITIN PANEL
Ferritin: 74 ng/mL (ref 30–400)
Iron Saturation: 53 % (ref 15–55)
Iron: 165 ug/dL (ref 38–169)
Total Iron Binding Capacity: 309 ug/dL (ref 250–450)
UIBC: 144 ug/dL (ref 111–343)

## 2021-05-01 ENCOUNTER — Ambulatory Visit: Payer: PPO | Admitting: Cardiovascular Disease

## 2021-05-11 ENCOUNTER — Encounter: Payer: Self-pay | Admitting: Physician Assistant

## 2021-05-15 ENCOUNTER — Encounter: Payer: Self-pay | Admitting: Family Medicine

## 2021-06-01 ENCOUNTER — Other Ambulatory Visit: Payer: Self-pay | Admitting: Cardiovascular Disease

## 2021-06-01 NOTE — Telephone Encounter (Signed)
70m, 83kg, scr 0.8 03/05/21, lovw/03/27/21, ccr 90.8

## 2021-06-01 NOTE — Telephone Encounter (Signed)
Please review for refill, Thanks !  

## 2021-06-21 ENCOUNTER — Other Ambulatory Visit: Payer: Self-pay | Admitting: Gastroenterology

## 2021-06-22 ENCOUNTER — Other Ambulatory Visit: Payer: Self-pay

## 2021-06-22 DIAGNOSIS — E039 Hypothyroidism, unspecified: Secondary | ICD-10-CM

## 2021-06-22 MED ORDER — LEVOTHYROXINE SODIUM 25 MCG PO TABS: ORAL_TABLET | ORAL | 0 refills | Status: AC

## 2021-06-22 NOTE — Telephone Encounter (Signed)
[facility] faxed refill request for the following medications:  levothyroxine (SYNTHROID) 25 MCG tablet  Last Rx: 08/03/20 Qty: 90 Refills: 1 LOV: 03/05/21 No scheduled follow-up appt at this time. Please advise. Thanks TNP

## 2021-06-27 DIAGNOSIS — D509 Iron deficiency anemia, unspecified: Secondary | ICD-10-CM | POA: Diagnosis not present

## 2022-01-07 ENCOUNTER — Telehealth: Payer: Self-pay | Admitting: Physician Assistant

## 2022-01-07 NOTE — Telephone Encounter (Signed)
Copied from [place] [phone]. Topic: Medicare AWV >> Jan 07, 2022  2:00 PM Cher Nakai R wrote: Reason for CRM:  Left message for patient to call back and schedule Medicare Annual Wellness Visit (AWV) in office.   If not able to come in office, please offer to do virtually or by telephone.   Last AWV:04/18/2020  Please schedule at anytime with [facility] Health Advisor.  If any questions, please contact me at [phone]

## 2022-10-22 IMAGING — CT CT ABD-PELV W/ CM
2 of 5 series · 15 of 46 positions shown, 17 images · IV contrast (omnipaque)
Comparison: No comparison studies available.

CLINICAL DATA: Intermittent lower cramping abdominal pain with
loose stools. Diverticulitis suspected.

EXAM:
CT ABDOMEN AND PELVIS WITH CONTRAST
TECHNIQUE: Multidetector CT imaging of the abdomen and pelvis was performed
using the standard protocol following bolus administration of
intravenous contrast.
CONTRAST:  100mL OMNIPAQUE IOHEXOL 300 MG/ML  SOLN

[Series 2: abd pelvis 5.00 · axial · 0.73mm/px · z∈[-1549,-1119]mm · 12 of 98 slices shown, 14 images]
[im 6/98  soft-tissue]
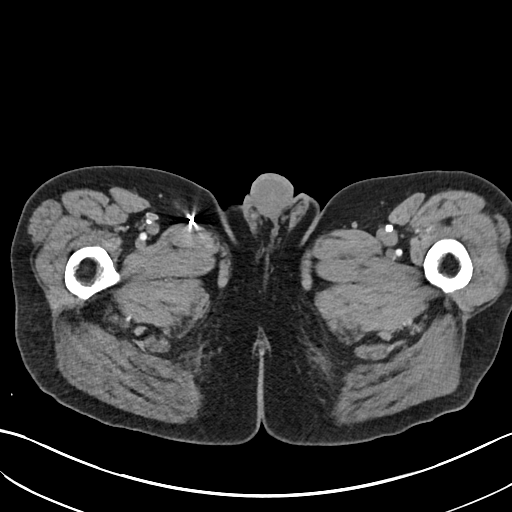
[im 6/98  bone]
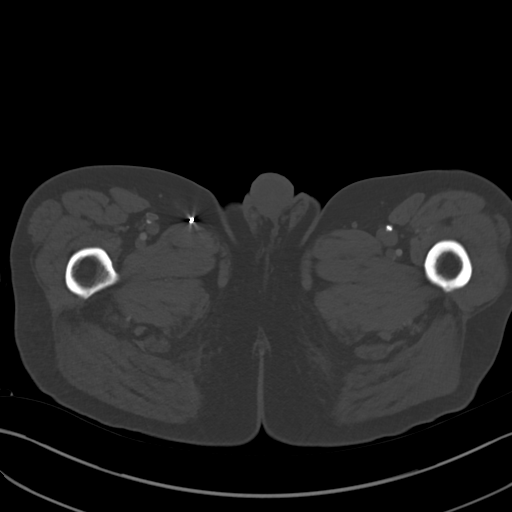
[im 17/98  soft-tissue]
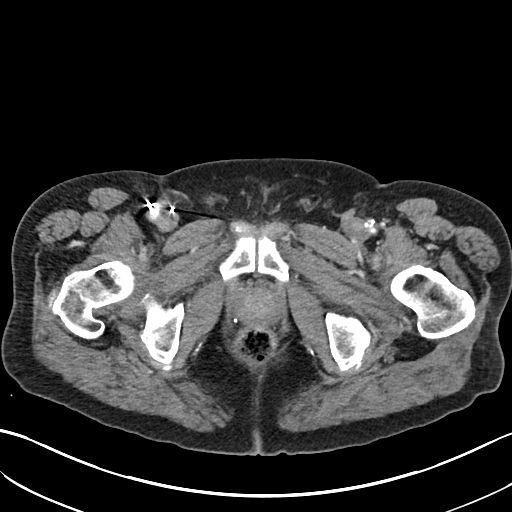
[im 22/98  soft-tissue]
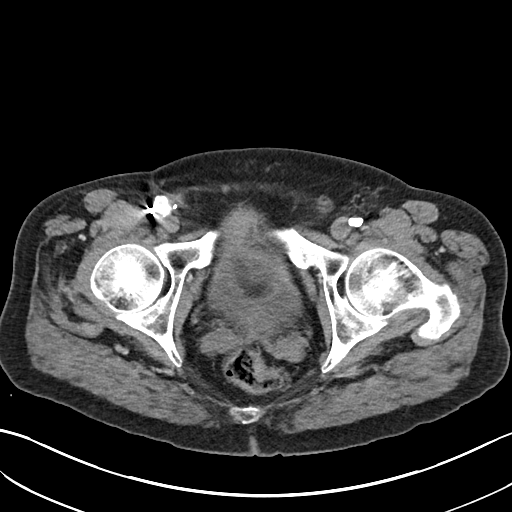
[im 27/98  soft-tissue]
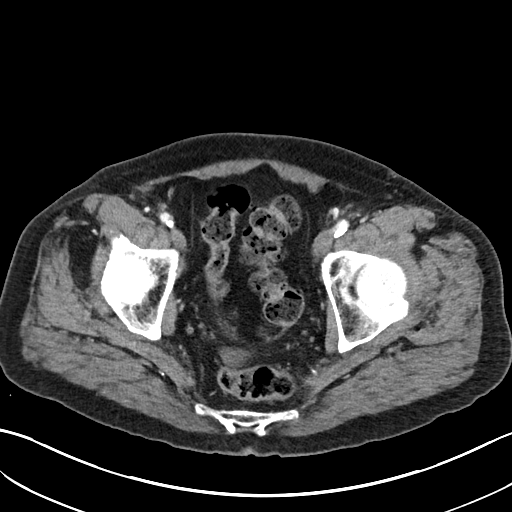
[im 38/98  soft-tissue]
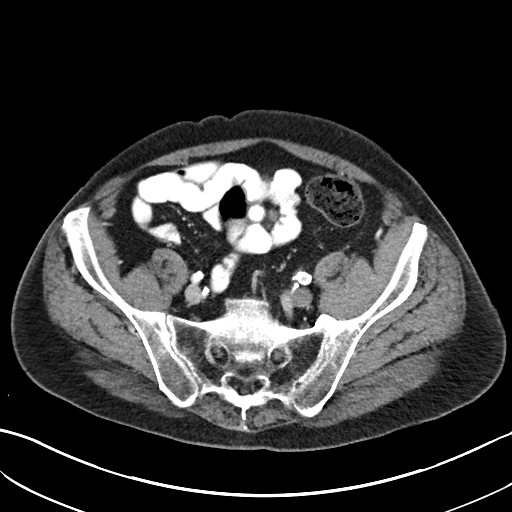
[im 44/98  soft-tissue]
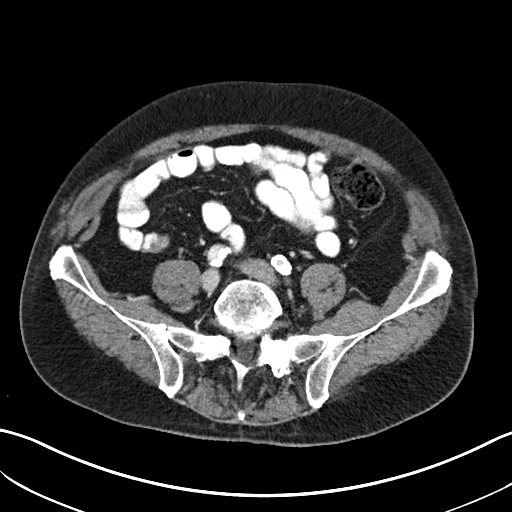
[im 54/98  soft-tissue]
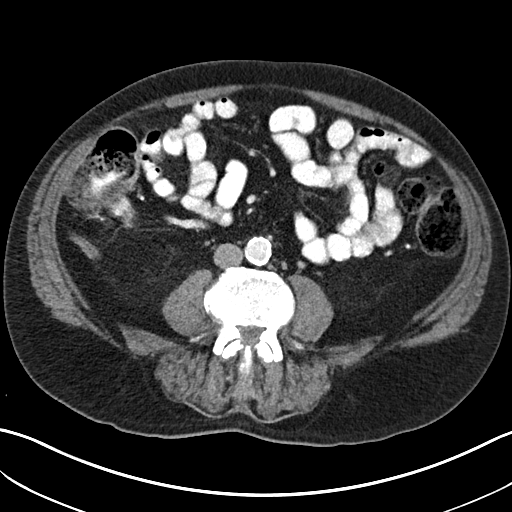
[im 60/98  soft-tissue]
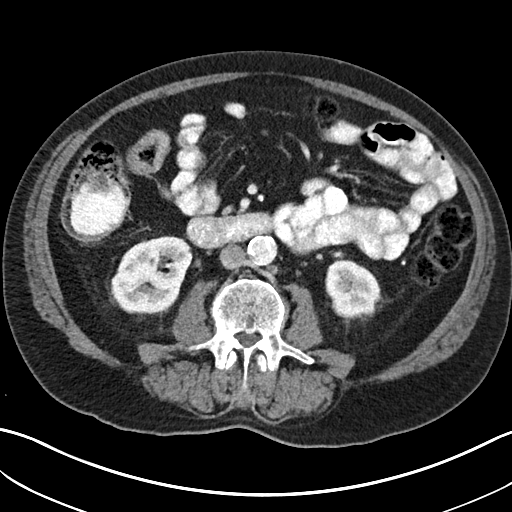
[im 71/98  soft-tissue]
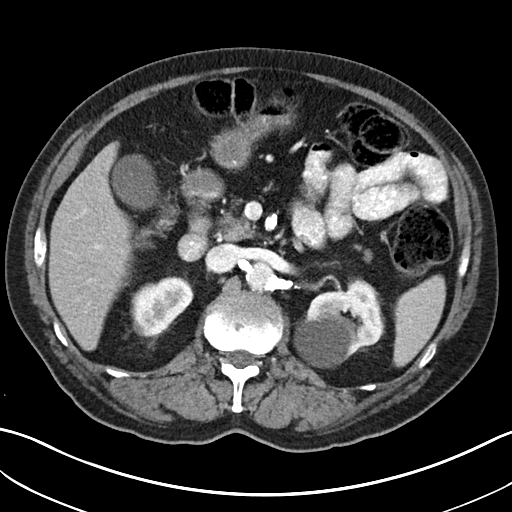
[im 71/98  bone]
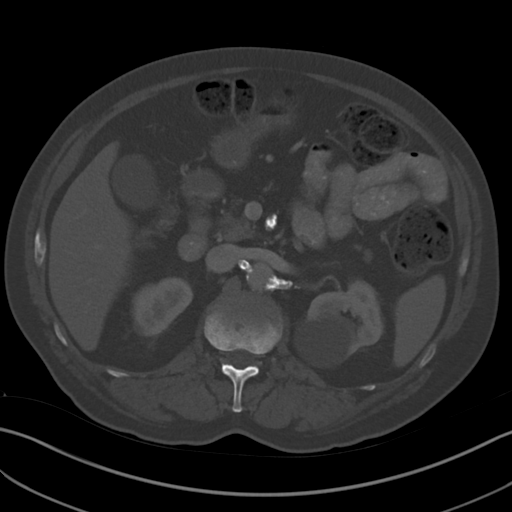
[im 76/98  soft-tissue]
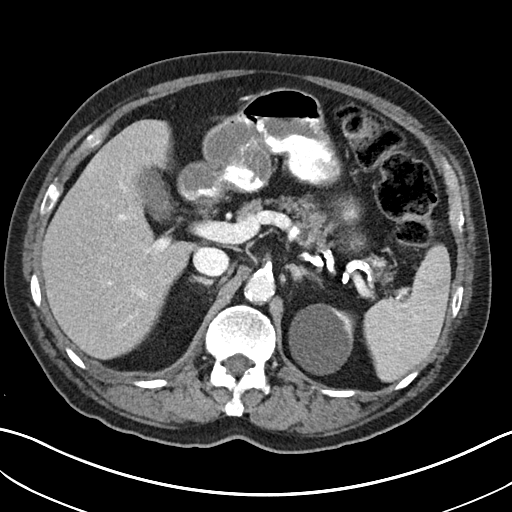
[im 81/98  soft-tissue]
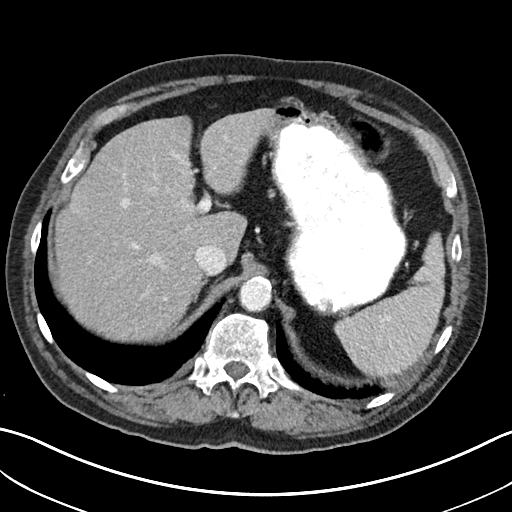
[im 92/98  soft-tissue]
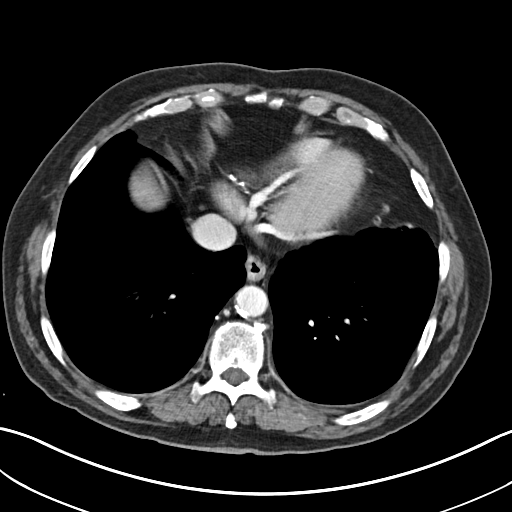

[Series 4: coronals abd pelvis 2.00 cor · coronal · 0.73mm/px · 3 of 153 slices shown]
[im 51/153  soft-tissue]
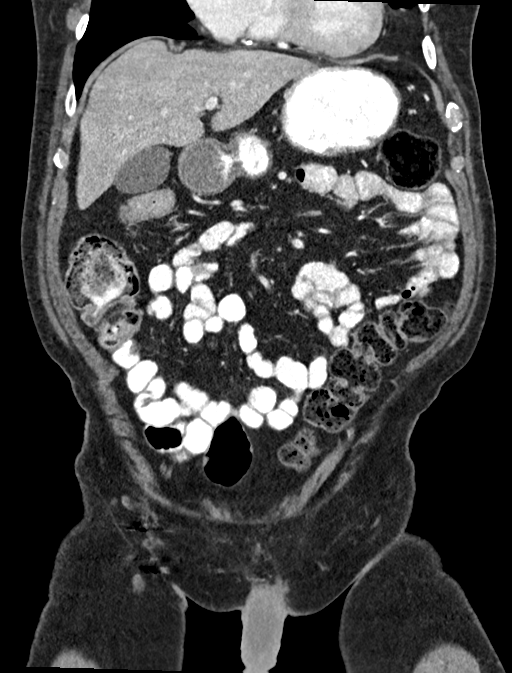
[im 68/153  soft-tissue]
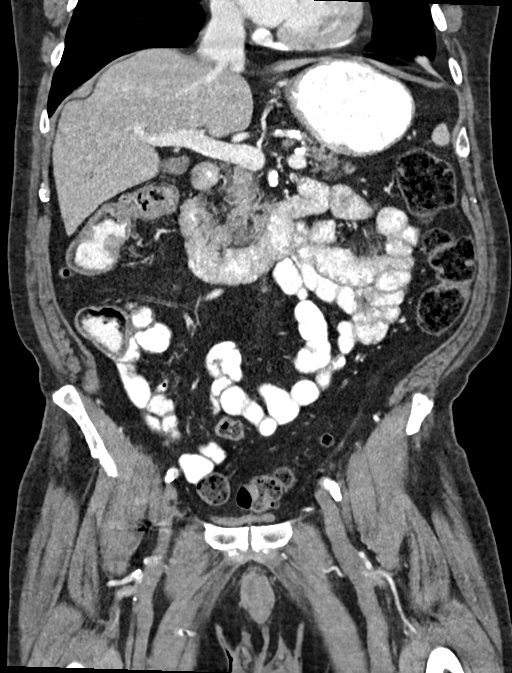
[im 85/153  soft-tissue]
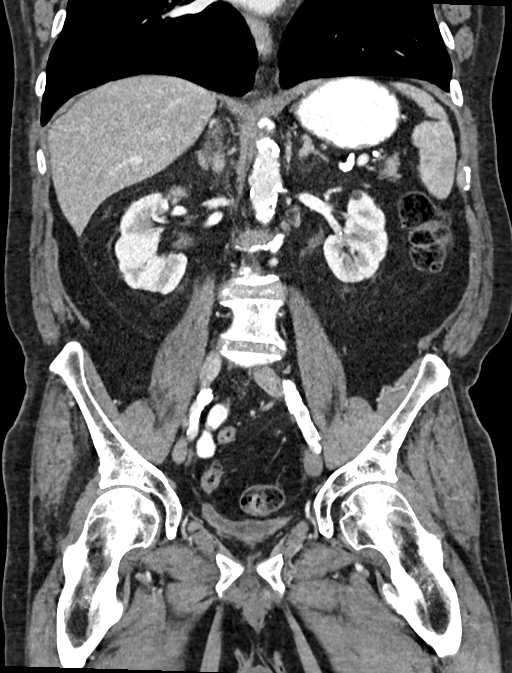

[15 of 46 positions shown; findings below may reference images not displayed]

FINDINGS: Lower chest: Unremarkable.

Hepatobiliary: No suspicious focal abnormality within the liver
parenchyma. There is no evidence for gallstones, gallbladder wall
thickening, or pericholecystic fluid. No intrahepatic or
extrahepatic biliary dilation.

Pancreas: No focal mass lesion. No dilatation of the main duct. No
intraparenchymal cyst. No peripancreatic edema.

Spleen: No splenomegaly. No focal mass lesion.

Adrenals/Urinary Tract: No adrenal nodule or mass. 5 cm cyst
identified upper pole left kidney. Tiny interpolar low-density right
renal lesion is too small to characterize but likely a cyst. No
evidence for hydroureter. The urinary bladder appears normal for the
degree of distention.

Stomach/Bowel: Stomach is unremarkable. No gastric wall thickening.
No evidence of outlet obstruction. Duodenum is normally positioned
as is the ligament of Treitz. No small bowel wall thickening. No
small bowel dilatation. The terminal ileum is normal. The appendix
is normal. No gross colonic mass. No colonic wall thickening.

Vascular/Lymphatic: There is abdominal aortic atherosclerosis
without aneurysm. There is no gastrohepatic or hepatoduodenal
ligament lymphadenopathy. No retroperitoneal or mesenteric
lymphadenopathy. No pelvic sidewall lymphadenopathy.

Reproductive: The prostate gland and seminal vesicles are
unremarkable.

Other: No intraperitoneal free fluid.

Musculoskeletal: No worrisome lytic or sclerotic osseous
abnormality.
IMPRESSION: 1. No acute findings in the abdomen or pelvis. Specifically, no
findings to explain the patient's history of lower abdominal pain.
2. 5 cm left renal cyst.
3. Aortic Atherosclerosis ([id number]).
